# Patient Record
Sex: Male | Born: 1940
Health system: Southern US, Community
[De-identification: ages and names within clinical notes are randomized; demographics above are authoritative.]

## PROBLEM LIST (undated history)

## (undated) DIAGNOSIS — Z9889 Other specified postprocedural states: Secondary | ICD-10-CM

## (undated) DIAGNOSIS — I2510 Atherosclerotic heart disease of native coronary artery without angina pectoris: Secondary | ICD-10-CM

## (undated) DIAGNOSIS — Z95810 Presence of automatic (implantable) cardiac defibrillator: Secondary | ICD-10-CM

## (undated) DIAGNOSIS — I472 Ventricular tachycardia, unspecified: Secondary | ICD-10-CM

## (undated) DIAGNOSIS — N62 Hypertrophy of breast: Secondary | ICD-10-CM

## (undated) DIAGNOSIS — I739 Peripheral vascular disease, unspecified: Secondary | ICD-10-CM

## (undated) DIAGNOSIS — I519 Heart disease, unspecified: Secondary | ICD-10-CM

## (undated) DIAGNOSIS — I10 Essential (primary) hypertension: Secondary | ICD-10-CM

## (undated) DIAGNOSIS — E785 Hyperlipidemia, unspecified: Secondary | ICD-10-CM

## (undated) DIAGNOSIS — I5022 Chronic systolic (congestive) heart failure: Secondary | ICD-10-CM

## (undated) DIAGNOSIS — R112 Nausea with vomiting, unspecified: Secondary | ICD-10-CM

## (undated) DIAGNOSIS — T50905A Adverse effect of unspecified drugs, medicaments and biological substances, initial encounter: Secondary | ICD-10-CM

## (undated) DIAGNOSIS — I255 Ischemic cardiomyopathy: Secondary | ICD-10-CM

## (undated) DIAGNOSIS — I779 Disorder of arteries and arterioles, unspecified: Secondary | ICD-10-CM

## (undated) DIAGNOSIS — I351 Nonrheumatic aortic (valve) insufficiency: Secondary | ICD-10-CM

## (undated) DIAGNOSIS — I2699 Other pulmonary embolism without acute cor pulmonale: Secondary | ICD-10-CM

## (undated) HISTORY — DX: Adverse effect of unspecified drugs, medicaments and biological substances, initial encounter: T50.905A

## (undated) HISTORY — DX: Nonrheumatic aortic (valve) insufficiency: I35.1

## (undated) HISTORY — DX: Peripheral vascular disease, unspecified: I73.9

## (undated) HISTORY — DX: Hypertrophy of breast: N62

## (undated) HISTORY — PX: CARDIAC CATHETERIZATION: SHX172

## (undated) HISTORY — PX: RETROPUBIC PROSTATECTOMY: SUR1055

## (undated) HISTORY — PX: OTHER SURGICAL HISTORY: SHX169

## (undated) HISTORY — DX: Hyperlipidemia, unspecified: E78.5

## (undated) HISTORY — PX: THYROIDECTOMY: SHX17

## (undated) HISTORY — DX: Presence of automatic (implantable) cardiac defibrillator: Z95.810

## (undated) HISTORY — PX: CARDIAC DEFIBRILLATOR PLACEMENT: SHX171

## (undated) HISTORY — PX: INGUINAL HERNIA REPAIR: SUR1180

## (undated) HISTORY — PX: THYROID SURGERY: SHX805

## (undated) HISTORY — DX: Ventricular tachycardia: I47.2

## (undated) HISTORY — DX: Other pulmonary embolism without acute cor pulmonale: I26.99

## (undated) HISTORY — DX: Ischemic cardiomyopathy: I25.5

## (undated) HISTORY — DX: Disorder of arteries and arterioles, unspecified: I77.9

## (undated) HISTORY — PX: CORONARY STENT PLACEMENT: SHX1402

## (undated) HISTORY — DX: Ventricular tachycardia, unspecified: I47.20

## (undated) HISTORY — DX: Heart disease, unspecified: I51.9

## (undated) HISTORY — PX: THORACOTOMY: SUR1349

---

## 2001-12-10 ENCOUNTER — Inpatient Hospital Stay (HOSPITAL_COMMUNITY): Admission: EM | Admit: 2001-12-10 | Discharge: 2001-12-13 | Payer: Self-pay | Admitting: Cardiology

## 2003-02-19 ENCOUNTER — Inpatient Hospital Stay (HOSPITAL_COMMUNITY): Admission: RE | Admit: 2003-02-19 | Discharge: 2003-02-22 | Payer: Self-pay | Admitting: Urology

## 2003-02-19 ENCOUNTER — Encounter (INDEPENDENT_AMBULATORY_CARE_PROVIDER_SITE_OTHER): Payer: Self-pay | Admitting: *Deleted

## 2003-09-01 ENCOUNTER — Encounter (INDEPENDENT_AMBULATORY_CARE_PROVIDER_SITE_OTHER): Payer: Self-pay

## 2003-09-01 ENCOUNTER — Ambulatory Visit (HOSPITAL_COMMUNITY): Admission: RE | Admit: 2003-09-01 | Discharge: 2003-09-01 | Payer: Self-pay | Admitting: Surgery

## 2003-12-09 ENCOUNTER — Ambulatory Visit (HOSPITAL_COMMUNITY): Admission: RE | Admit: 2003-12-09 | Discharge: 2003-12-09 | Payer: Self-pay | Admitting: Endocrinology

## 2003-12-23 ENCOUNTER — Ambulatory Visit (HOSPITAL_COMMUNITY): Admission: RE | Admit: 2003-12-23 | Discharge: 2003-12-23 | Payer: Self-pay | Admitting: General Surgery

## 2003-12-23 ENCOUNTER — Encounter (INDEPENDENT_AMBULATORY_CARE_PROVIDER_SITE_OTHER): Payer: Self-pay | Admitting: *Deleted

## 2004-02-13 ENCOUNTER — Encounter (INDEPENDENT_AMBULATORY_CARE_PROVIDER_SITE_OTHER): Payer: Self-pay | Admitting: *Deleted

## 2004-02-13 ENCOUNTER — Observation Stay (HOSPITAL_COMMUNITY): Admission: RE | Admit: 2004-02-13 | Discharge: 2004-02-14 | Payer: Self-pay | Admitting: Surgery

## 2004-03-26 ENCOUNTER — Encounter (INDEPENDENT_AMBULATORY_CARE_PROVIDER_SITE_OTHER): Payer: Self-pay | Admitting: *Deleted

## 2004-03-26 ENCOUNTER — Ambulatory Visit (HOSPITAL_COMMUNITY): Admission: RE | Admit: 2004-03-26 | Discharge: 2004-03-27 | Payer: Self-pay | Admitting: Surgery

## 2004-03-28 ENCOUNTER — Inpatient Hospital Stay (HOSPITAL_COMMUNITY): Admission: EM | Admit: 2004-03-28 | Discharge: 2004-03-30 | Payer: Self-pay | Admitting: Emergency Medicine

## 2004-03-31 ENCOUNTER — Emergency Department (HOSPITAL_COMMUNITY): Admission: EM | Admit: 2004-03-31 | Discharge: 2004-03-31 | Payer: Self-pay | Admitting: Emergency Medicine

## 2004-03-31 ENCOUNTER — Observation Stay (HOSPITAL_COMMUNITY): Admission: EM | Admit: 2004-03-31 | Discharge: 2004-04-01 | Payer: Self-pay | Admitting: Urology

## 2004-03-31 ENCOUNTER — Ambulatory Visit (HOSPITAL_BASED_OUTPATIENT_CLINIC_OR_DEPARTMENT_OTHER): Admission: RE | Admit: 2004-03-31 | Discharge: 2004-03-31 | Payer: Self-pay | Admitting: Urology

## 2004-04-26 ENCOUNTER — Ambulatory Visit (HOSPITAL_COMMUNITY): Admission: RE | Admit: 2004-04-26 | Discharge: 2004-04-26 | Payer: Self-pay | Admitting: Endocrinology

## 2004-05-05 ENCOUNTER — Encounter (HOSPITAL_COMMUNITY): Admission: RE | Admit: 2004-05-05 | Discharge: 2004-08-03 | Payer: Self-pay | Admitting: Endocrinology

## 2004-05-21 ENCOUNTER — Encounter: Payer: Self-pay | Admitting: Cardiology

## 2004-05-21 ENCOUNTER — Inpatient Hospital Stay (HOSPITAL_COMMUNITY): Admission: AD | Admit: 2004-05-21 | Discharge: 2004-05-27 | Payer: Self-pay | Admitting: Cardiology

## 2004-06-18 ENCOUNTER — Ambulatory Visit (HOSPITAL_COMMUNITY): Admission: RE | Admit: 2004-06-18 | Discharge: 2004-06-18 | Payer: Self-pay | Admitting: Urology

## 2004-08-26 ENCOUNTER — Ambulatory Visit: Payer: Self-pay | Admitting: Cardiology

## 2004-09-27 ENCOUNTER — Ambulatory Visit: Payer: Self-pay | Admitting: Cardiology

## 2005-01-11 ENCOUNTER — Ambulatory Visit: Payer: Self-pay | Admitting: Internal Medicine

## 2005-04-25 ENCOUNTER — Encounter (HOSPITAL_COMMUNITY): Admission: RE | Admit: 2005-04-25 | Discharge: 2005-04-29 | Payer: Self-pay | Admitting: Endocrinology

## 2005-04-28 ENCOUNTER — Ambulatory Visit: Payer: Self-pay

## 2005-05-16 ENCOUNTER — Ambulatory Visit: Payer: Self-pay | Admitting: Cardiology

## 2005-05-30 ENCOUNTER — Ambulatory Visit: Payer: Self-pay | Admitting: Cardiology

## 2005-08-10 ENCOUNTER — Ambulatory Visit: Payer: Self-pay | Admitting: *Deleted

## 2005-10-18 ENCOUNTER — Ambulatory Visit: Payer: Self-pay | Admitting: Cardiology

## 2005-12-26 ENCOUNTER — Ambulatory Visit: Payer: Self-pay | Admitting: Cardiology

## 2005-12-27 ENCOUNTER — Encounter: Payer: Self-pay | Admitting: Cardiology

## 2006-01-12 ENCOUNTER — Ambulatory Visit: Payer: Self-pay | Admitting: Cardiology

## 2006-01-19 ENCOUNTER — Ambulatory Visit: Payer: Self-pay | Admitting: Cardiology

## 2006-01-24 ENCOUNTER — Ambulatory Visit: Payer: Self-pay | Admitting: Cardiology

## 2006-02-28 ENCOUNTER — Ambulatory Visit: Payer: Self-pay | Admitting: Cardiology

## 2006-04-24 ENCOUNTER — Encounter (HOSPITAL_COMMUNITY): Admission: RE | Admit: 2006-04-24 | Discharge: 2006-06-28 | Payer: Self-pay | Admitting: Endocrinology

## 2006-06-29 ENCOUNTER — Ambulatory Visit: Payer: Self-pay | Admitting: Cardiology

## 2006-07-12 ENCOUNTER — Ambulatory Visit: Payer: Self-pay | Admitting: Internal Medicine

## 2006-07-24 ENCOUNTER — Ambulatory Visit: Payer: Self-pay | Admitting: Cardiology

## 2006-07-31 ENCOUNTER — Inpatient Hospital Stay (HOSPITAL_COMMUNITY): Admission: RE | Admit: 2006-07-31 | Discharge: 2006-08-03 | Payer: Self-pay | Admitting: Internal Medicine

## 2006-07-31 ENCOUNTER — Ambulatory Visit: Payer: Self-pay | Admitting: Pulmonary Disease

## 2006-07-31 ENCOUNTER — Ambulatory Visit: Payer: Self-pay | Admitting: Internal Medicine

## 2006-08-09 ENCOUNTER — Ambulatory Visit: Payer: Self-pay

## 2006-11-23 ENCOUNTER — Ambulatory Visit: Payer: Self-pay | Admitting: Cardiology

## 2006-12-01 ENCOUNTER — Encounter: Payer: Self-pay | Admitting: Cardiology

## 2007-01-16 ENCOUNTER — Ambulatory Visit: Payer: Self-pay | Admitting: Internal Medicine

## 2007-04-18 ENCOUNTER — Ambulatory Visit: Payer: Self-pay | Admitting: Internal Medicine

## 2007-05-16 ENCOUNTER — Ambulatory Visit: Payer: Self-pay | Admitting: Internal Medicine

## 2007-07-02 ENCOUNTER — Ambulatory Visit (HOSPITAL_COMMUNITY): Admission: RE | Admit: 2007-07-02 | Discharge: 2007-07-02 | Payer: Self-pay | Admitting: Surgery

## 2007-07-02 ENCOUNTER — Encounter (INDEPENDENT_AMBULATORY_CARE_PROVIDER_SITE_OTHER): Payer: Self-pay | Admitting: Surgery

## 2007-07-26 ENCOUNTER — Ambulatory Visit: Payer: Self-pay | Admitting: Internal Medicine

## 2007-08-07 ENCOUNTER — Ambulatory Visit: Payer: Self-pay | Admitting: Cardiology

## 2007-08-28 ENCOUNTER — Ambulatory Visit: Payer: Self-pay | Admitting: Internal Medicine

## 2007-10-16 ENCOUNTER — Encounter: Payer: Self-pay | Admitting: Cardiology

## 2007-11-27 ENCOUNTER — Ambulatory Visit: Payer: Self-pay | Admitting: Internal Medicine

## 2008-01-21 ENCOUNTER — Encounter: Payer: Self-pay | Admitting: Physician Assistant

## 2008-02-26 ENCOUNTER — Ambulatory Visit: Payer: Self-pay | Admitting: Internal Medicine

## 2008-04-11 ENCOUNTER — Ambulatory Visit: Payer: Self-pay | Admitting: Cardiology

## 2008-04-22 ENCOUNTER — Encounter: Payer: Self-pay | Admitting: Physician Assistant

## 2008-04-22 ENCOUNTER — Ambulatory Visit: Payer: Self-pay | Admitting: Cardiology

## 2008-04-28 ENCOUNTER — Ambulatory Visit: Payer: Self-pay | Admitting: Internal Medicine

## 2008-05-22 ENCOUNTER — Encounter: Admission: RE | Admit: 2008-05-22 | Discharge: 2008-05-22 | Payer: Self-pay | Admitting: Endocrinology

## 2008-07-29 ENCOUNTER — Ambulatory Visit: Payer: Self-pay | Admitting: Internal Medicine

## 2008-09-04 ENCOUNTER — Encounter: Payer: Self-pay | Admitting: Physician Assistant

## 2008-10-28 ENCOUNTER — Ambulatory Visit: Payer: Self-pay | Admitting: Internal Medicine

## 2008-10-29 ENCOUNTER — Ambulatory Visit: Payer: Self-pay | Admitting: Internal Medicine

## 2008-12-26 ENCOUNTER — Ambulatory Visit: Payer: Self-pay | Admitting: Cardiology

## 2009-01-13 ENCOUNTER — Encounter: Payer: Self-pay | Admitting: Internal Medicine

## 2009-01-27 ENCOUNTER — Ambulatory Visit: Payer: Self-pay | Admitting: Internal Medicine

## 2009-02-05 ENCOUNTER — Encounter: Payer: Self-pay | Admitting: Cardiology

## 2009-04-10 DIAGNOSIS — Z87442 Personal history of urinary calculi: Secondary | ICD-10-CM

## 2009-04-10 DIAGNOSIS — I509 Heart failure, unspecified: Secondary | ICD-10-CM | POA: Insufficient documentation

## 2009-04-10 DIAGNOSIS — I472 Ventricular tachycardia: Secondary | ICD-10-CM | POA: Insufficient documentation

## 2009-04-10 DIAGNOSIS — I2699 Other pulmonary embolism without acute cor pulmonale: Secondary | ICD-10-CM | POA: Insufficient documentation

## 2009-04-10 DIAGNOSIS — J432 Centrilobular emphysema: Secondary | ICD-10-CM

## 2009-04-10 DIAGNOSIS — N200 Calculus of kidney: Secondary | ICD-10-CM

## 2009-04-10 DIAGNOSIS — I2589 Other forms of chronic ischemic heart disease: Secondary | ICD-10-CM | POA: Insufficient documentation

## 2009-06-24 ENCOUNTER — Ambulatory Visit: Payer: Self-pay | Admitting: Internal Medicine

## 2009-07-20 ENCOUNTER — Ambulatory Visit: Payer: Self-pay | Admitting: Cardiology

## 2009-07-20 DIAGNOSIS — I6529 Occlusion and stenosis of unspecified carotid artery: Secondary | ICD-10-CM

## 2009-09-22 ENCOUNTER — Encounter: Payer: Self-pay | Admitting: Internal Medicine

## 2009-10-12 ENCOUNTER — Encounter: Payer: Self-pay | Admitting: Internal Medicine

## 2009-10-20 ENCOUNTER — Telehealth (INDEPENDENT_AMBULATORY_CARE_PROVIDER_SITE_OTHER): Payer: Self-pay | Admitting: *Deleted

## 2009-12-21 ENCOUNTER — Encounter: Payer: Self-pay | Admitting: Cardiology

## 2009-12-22 ENCOUNTER — Ambulatory Visit: Payer: Self-pay | Admitting: Internal Medicine

## 2009-12-29 ENCOUNTER — Encounter: Payer: Self-pay | Admitting: Internal Medicine

## 2010-03-23 ENCOUNTER — Encounter: Payer: Self-pay | Admitting: Internal Medicine

## 2010-03-30 ENCOUNTER — Ambulatory Visit: Payer: Self-pay | Admitting: Internal Medicine

## 2010-04-06 ENCOUNTER — Encounter: Payer: Self-pay | Admitting: Internal Medicine

## 2010-06-22 ENCOUNTER — Ambulatory Visit: Payer: Self-pay | Admitting: Internal Medicine

## 2010-06-22 DIAGNOSIS — Z95810 Presence of automatic (implantable) cardiac defibrillator: Secondary | ICD-10-CM | POA: Insufficient documentation

## 2010-08-23 ENCOUNTER — Encounter: Payer: Self-pay | Admitting: Physician Assistant

## 2010-08-23 ENCOUNTER — Ambulatory Visit: Payer: Self-pay | Admitting: Cardiology

## 2010-08-23 DIAGNOSIS — I2510 Atherosclerotic heart disease of native coronary artery without angina pectoris: Secondary | ICD-10-CM

## 2010-08-23 DIAGNOSIS — I5022 Chronic systolic (congestive) heart failure: Secondary | ICD-10-CM

## 2010-08-23 DIAGNOSIS — E785 Hyperlipidemia, unspecified: Secondary | ICD-10-CM

## 2010-08-23 DIAGNOSIS — I38 Endocarditis, valve unspecified: Secondary | ICD-10-CM | POA: Insufficient documentation

## 2010-08-23 DIAGNOSIS — R0989 Other specified symptoms and signs involving the circulatory and respiratory systems: Secondary | ICD-10-CM | POA: Insufficient documentation

## 2010-09-02 ENCOUNTER — Encounter: Payer: Self-pay | Admitting: Cardiology

## 2010-09-07 ENCOUNTER — Encounter (INDEPENDENT_AMBULATORY_CARE_PROVIDER_SITE_OTHER): Payer: Self-pay | Admitting: *Deleted

## 2010-09-23 ENCOUNTER — Ambulatory Visit: Payer: Self-pay | Admitting: Internal Medicine

## 2010-10-26 ENCOUNTER — Encounter (INDEPENDENT_AMBULATORY_CARE_PROVIDER_SITE_OTHER): Payer: Self-pay | Admitting: *Deleted

## 2010-10-27 ENCOUNTER — Encounter (INDEPENDENT_AMBULATORY_CARE_PROVIDER_SITE_OTHER): Payer: Self-pay | Admitting: *Deleted

## 2010-10-28 ENCOUNTER — Encounter: Payer: Self-pay | Admitting: Internal Medicine

## 2010-11-04 ENCOUNTER — Ambulatory Visit
Admission: RE | Admit: 2010-11-04 | Discharge: 2010-11-04 | Payer: Self-pay | Source: Home / Self Care | Attending: Internal Medicine | Admitting: Internal Medicine

## 2010-11-07 ENCOUNTER — Encounter: Payer: Self-pay | Admitting: Critical Care Medicine

## 2010-11-07 ENCOUNTER — Encounter: Payer: Self-pay | Admitting: Endocrinology

## 2010-11-16 NOTE — Cardiovascular Report (Signed)
Summary: Office Visit Remote   Office Visit Remote   Imported By: Roderic Ovens 04/10/2010 11:53:17  _____________________________________________________________________  External Attachment:    Type:   Image     Comment:   External Document

## 2010-11-16 NOTE — Letter (Signed)
Summary: Remote Device Check  [facility], Main Office  [address]   [place], [place] [address]   Phone: [phone]  Fax: [phone]     April 06, 2010 MRN: [id number]   [name] [address] [place], [place]  [address]   Dear [name],   Your remote transmission was recieved and reviewed by your physician.  All diagnostics were within normal limits for you.   ___X___Your next office visit is scheduled for:  SEPTEMBER 2011 W/DR Ladona Ridgel.  Please call our office to schedule an appointment.    Sincerely,  Vella Kohler

## 2010-11-16 NOTE — Cardiovascular Report (Signed)
Summary: Office Visit Remote   Office Visit Remote   Imported By: Roderic Ovens 12/30/2009 11:06:53  _____________________________________________________________________  External Attachment:    Type:   Image     Comment:   External Document

## 2010-11-16 NOTE — Progress Notes (Signed)
Summary: Med Questions  Phone Note Call from Patient Call back at Home Phone [phone]   Summary of Call: Pt left message on Lydia's voicemail asking for return call regarding some prescription questions. Attempted to reach pt but no answer. Left message to call back on machine. Initial call taken by: Cyril Loosen, RN, BSN,  October 20, 2009 3:43 PM  Follow-up for Phone Call        Needs all cardiac meds refilled to [facility]. Follow-up by: Cyril Loosen, RN, BSN,  October 21, 2009 3:40 PM    Prescriptions: TRANDOLAPRIL 2 MG TABS (TRANDOLAPRIL) Take 1 1/2 tablet by mouth once a day  #135 x 3   Entered by:   Cyril Loosen, RN, BSN   Authorized by:   Lewayne Bunting, MD, [facility]   Signed by:   Cyril Loosen, RN, BSN on 10/21/2009   Method used:   Electronically to        [facility]* (retail)       [address] [place], [place]  [address]       Ph: [phone]       Fax: [phone]   RxID:   [id number] SIMVASTATIN 20 MG TABS (SIMVASTATIN) Take 1 tablet by mouth at bedtime  #90 x 3   Entered by:   Cyril Loosen, RN, BSN   Authorized by:   Lewayne Bunting, MD, [facility]   Signed by:   Cyril Loosen, RN, BSN on 10/21/2009   Method used:   Electronically to        [facility]* (retail)       [address] [place], [place]  [address]       Ph: [phone]       Fax: [phone]   RxID:   [id number] LASIX 20 MG TABS (FUROSEMIDE) Take 1 tablet by mouth every morning  #90 x 3   Entered by:   Cyril Loosen, RN, BSN   Authorized by:   Lewayne Bunting, MD, [facility]   Signed by:   Cyril Loosen, RN, BSN on 10/21/2009   Method used:   Electronically to        [facility]* (retail)       [address] [place], [place]  [address]       Ph: [phone]       Fax: [phone]   RxID:   [id number] EPLERENONE 25 MG TABS (EPLERENONE) Take 1 tablet by mouth once a day  #90 x 3   Entered by:   Cyril Loosen, RN, BSN   Authorized by:   Lewayne Bunting, MD, [facility]   Signed by:    Cyril Loosen, RN, BSN on 10/21/2009   Method used:   Electronically to        [facility] (retail)       [address] [place], [place]  [address]       Ph: [phone]       Fax: [phone]   RxID:   [id number] DIGOXIN 0.25 MG TABS (DIGOXIN) Take 1 tablet by mouth once a day  #90 x 3   Entered by:   Cyril Loosen, RN, BSN   Authorized by:   Lewayne Bunting, MD, [facility]   Signed by:   Cyril Loosen, RN, BSN on 10/21/2009   Method used:   Electronically to  [facility]* (retail)       [address] [place], [place]  [address]       Ph: [phone]       Fax: [phone]   RxID:   [id number] COREG 25 MG TABS (CARVEDILOL) Take 1 tablet by mouth twice a day  #180 x 3   Entered by:   Cyril Loosen, RN, BSN   Authorized by:   Lewayne Bunting, MD, [facility]   Signed by:   Cyril Loosen, RN, BSN on 10/21/2009   Method used:   Electronically to        [facility]* (retail)       [address] [place], [place]  [address]       Ph: [phone]       Fax: [phone]   RxID:   [id number]

## 2010-11-16 NOTE — Assessment & Plan Note (Signed)
Summary: 1 YR FU PER OCT REMINDER   Visit Type:  Follow-up Primary Provider:  Dhruv Vyas,MD   History of Present Illness: patient presents for annual followup.  He denies any interim development of signs or symptoms suggestive of unstable angina pectoris, or decompensated heart failure. He denies any defibrillator shocks, or tachycardia palpitations. He had a recent scheduled device clinic check with Dr. Ladona Ridgel, with no noted adjustments and no evidence of recurrent VT.  Patient is awaiting scheduling for multiple teeth extraction, later this week. He was instructed to hold his aspirin last week. This apparently will be done under local anesthetic.  Preventive Screening-Counseling & Management  Alcohol-Tobacco     Smoking Status: quit     Year Quit: 2005  Current Medications (verified): 1)  Coreg 25 Mg Tabs (Carvedilol) .... Take 1 Tablet By Mouth Twice A Day 2)  Digoxin 0.25 Mg Tabs (Digoxin) .... Take 1 Tablet By Mouth Once A Day 3)  Eplerenone 25 Mg Tabs (Eplerenone) .... Take 1 Tablet By Mouth Once A Day 4)  Lasix 20 Mg Tabs (Furosemide) .... Take 1 Tablet By Mouth Every Morning 5)  Simvastatin 20 Mg Tabs (Simvastatin) .... Take 1 Tablet By Mouth At Bedtime 6)  Trandolapril 2 Mg Tabs (Trandolapril) .... Take 1 1/2 Tablet By Mouth Once A Day 7)  Levothyroxine Sodium 150 Mcg Tabs (Levothyroxine Sodium) .Marland Kitchen.. 1 By Mouth Once Daily 8)  Aspirin Ec 325 Mg Tbec (Aspirin) .... Take 1/2 Tab (162mg ) Dailyl 9)  Niacin 500 Mg Tabs (Niacin) .Marland Kitchen.. 1 By Mouth Once Daily 10)  Fish Oil 1000 Mg Caps (Omega-3 Fatty Acids) .Marland Kitchen.. 1 By Mouth Once Daily  Allergies (verified): No Known Drug Allergies  Comments:  Nurse/Medical Assistant: The patient's medication list and allergies were reviewed with the patient and were updated in the Medication and Allergy Lists.  Past History:  Past Medical History: NEPHROLITHIASIS (ICD-592.0) PULMONARY EMBOLISM (ICD-415.19) COPD (ICD-496) VENTRICULAR  TACHYCARDIA (ICD-427.1) CHF (ICD-428.0) RENAL CALCULUS, HX OF (ICD-V13.01) CARDIOMYOPATHY, ISCHEMIC (ICD-414.8) 1. Ischemic cardiomyopathy.  Ejection fraction 20-25%...improved to 50-55% by 2-D echo, 7/09 2. Painful gynecomastia secondary to spironolactone. 3. Coronary artery disease, status post non-ST-elevation myocardial     infarction with stent to the right coronary artery. 4. History of inducible polymorphic ventricular tachycardia, status     post Medtronic implantable cardioverter-defibrillator in August     2005. 5. Status post upgrade with cardiac resynchronization therapy in     October 2007. 6. Carotid artery stenosis.  There was a 50% stenosis bilaterally. 7. Prior history of pulmonary embolism. 8. Moderate aortic insufficiency. 9. Dyslipidemia. 10.Pulmonary hypertension.   Review of Systems       No fevers, chills, hemoptysis, dysphagia, melena, hematocheezia, hematuria, rash, claudication, orthopnea, pnd, pedal edema. All other systems negative.   Vital Signs:  Patient profile:   70 year old male Height:      69 inches Weight:      141 pounds Pulse rate:   53 / minute BP sitting:   137 / 75  (left arm) Cuff size:   regular  Vitals Entered By: Carlye Grippe (August 23, 2010 3:40 PM)  Physical Exam  Additional Exam:  GEN: 70 year old male comes sitting upright, no distress HEENT: NCAT,PERRLA,EOMI NECK:  left sided bruit; no JVD; no TM LUNGS: CTA bilaterally HEART: RRR (S1S2); grade 2-3/6 diastolic blow along the LSB ABD: soft, NT; intact BS EXT: intact distal pulses; no edema SKIN: warm, dry MUSC: no obvious deformity NEURO: A/O (x3)  EKG  Procedure date:  08/23/2010  Findings:      AV pacing at 54 bpm   ICD Specifications Following MD:  Lewayne Bunting, MD     Referring MD:  [facility] ICD Vendor:  Medtronic     ICD Model Number:  7304     ICD Serial Number:  [id number] ICD DOI:  07/31/2006     ICD Implanting MD:  Lewayne Bunting, MD  Lead 1:     Location: RA     DOI: 05/26/2004     Model #: 4098     Serial #: [id number]     Status: active Lead 2:    Location: RV     DOI: 05/26/2004     Model #: 5621     Serial #: [id number]     Status: active Lead 3:    Location: LV     DOI: 07/31/2006     Model #: [id number]     Serial #: [id number]     Status: active  Indications::  ICM   ICD Follow Up ICD Dependent:  No      Episodes Coumadin:  No  Brady Parameters Mode DDDR     Lower Rate Limit:  50     Upper Rate Limit 130 PAV 150     Sensed AV Delay:  130  Tachy Zones VF:  200     VT:  250     VT1:  162     Impression & Recommendations:  Problem # 1:  CHRONIC SYSTOLIC HEART FAILURE (ICD-428.22)  patient appears euvolemic both by history and physical presentation. continue current medication regimen. Check baseline labs. of note, prior history of EF 20-25%, improved to 50-55%, by 2-D echo, 7/09.  Problem # 2:  CORONARY ATHEROSCLEROSIS NATIVE CORONARY ARTERY (ICD-414.01)  no current indication for further ischemic workup. previous nuclear imaging study, in 2007, indicated large inferior scar with no definite ischemia; EF 26%, with global HK. Patient cleared to proceed with dental extraction, later this week, and will not require deactivation of his ICD. following the procedure, patient is to resume aspirin, albeit at the reduced dose of 162 mg daily.  Problem # 3:  AUTOMATIC IMPLANTABLE CARDIAC DEFIBRILLATOR SITU (ICD-V45.02)  followup with Dr. Sharrell Ku, as scheduled  Problem # 4:  DYSLIPIDEMIA (ICD-272.4)  reassess current lipid status with FLP/LFT profile  Problem # 5:  CAROTID BRUIT (ICD-785.9)  scheduled Dopplers to rule out significant left-sided disease. Previous study, in 2008, indicated nonobstructive disease, bilaterally  Problem # 6:  VALVULAR HEART DISEASE (ICD-424.90)  assessed as moderate, by last echo, 7/09  Other Orders: EKG w/ Interpretation (93000) T-Comprehensive Metabolic Panel ([address]27-25366) T-CBC No Diff  ([address]34-74259) T-Lipid Profile (56387-56433) Carotid Duplex (Carotid Duplex)  Patient Instructions: 1)  Decrease Aspirin to 162mg  daily 2)  Labs:  CMET, CBC, FLP 3)  Carotid doppler 4)  Follow up in  6 months

## 2010-11-16 NOTE — Cardiovascular Report (Signed)
Summary: Office Visit   Office Visit   Imported By: Roderic Ovens 06/29/2010 12:03:18  _____________________________________________________________________  External Attachment:    Type:   Image     Comment:   External Document

## 2010-11-16 NOTE — Assessment & Plan Note (Signed)
Summary: [employer]/SAF   Visit Type:  Follow-up Primary Provider:  Dr.Best in [facility]/Vyas   History of Present Illness: [name] returns today for followup.  He is a pleasant 70 yo man with a h/o ICM, CHF, VT, and is s/p BiV ICD implant for all of the above in the setting of  LBBB.  He denies c/p, sob, or peripheral edema. 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  Current Medications (verified): 1)  Coreg 25 Mg Tabs (Carvedilol) .... Take 1 Tablet By Mouth Twice A Day 2)  Digoxin 0.25 Mg Tabs (Digoxin) .... Take 1 Tablet By Mouth Once A Day 3)  Eplerenone 25 Mg Tabs (Eplerenone) .... Take 1 Tablet By Mouth Once A Day 4)  Lasix 20 Mg Tabs (Furosemide) .... Take 1 Tablet By Mouth Every Morning 5)  Simvastatin 20 Mg Tabs (Simvastatin) .... Take 1 Tablet By Mouth At Bedtime 6)  Trandolapril 2 Mg Tabs (Trandolapril) .... Take 1 1/2 Tablet By Mouth Once A Day 7)  Levothyroxine Sodium 150 Mcg Tabs (Levothyroxine Sodium) .Marland Kitchen.. 1 By Mouth Once Daily 8)  Aspirin Ec 325 Mg Tbec (Aspirin) .... Take One Tablet By Mouth Daily 9)  Niacin 500 Mg Tabs (Niacin) .Marland Kitchen.. 1 By Mouth Once Daily 10)  Fish Oil 1000 Mg Caps (Omega-3 Fatty Acids) .Marland Kitchen.. 1 By Mouth Once Daily  Allergies (verified): No Known Drug Allergies  Past History:  Past Medical History: Last updated: 07/20/2009 NEPHROLITHIASIS (ICD-592.0) PULMONARY EMBOLISM (ICD-415.19) COPD (ICD-496) VENTRICULAR TACHYCARDIA (ICD-427.1) CHF (ICD-428.0) RENAL CALCULUS, HX OF (ICD-V13.01) CARDIOMYOPATHY, ISCHEMIC (ICD-414.8) 1. Ischemic cardiomyopathy.  Ejection fraction 20-25%. 2. Painful gynecomastia secondary to spironolactone. 3. Coronary artery disease, status post non-ST-elevation myocardial     infarction with stent to the right coronary artery. 4. History of inducible polymorphic ventricular tachycardia, status     post Medtronic implantable cardioverter-defibrillator in August     2005. 5. Status post upgrade  with cardiac resynchronization therapy in     October 2007. 6. Carotid artery stenosis.  There was a 50% stenosis bilaterally. 7. Prior history of pulmonary embolism. 8. Moderate aortic insufficiency. 9. Dyslipidemia. 10.Pulmonary hypertension.   Past Surgical History: Last updated: 04/10/2009 Left thoracotomy  thyroid cancer with surgery x2.  stent to his right coronary artery.  Radical retropubic prostatectomy.  Repair of indirect right inguinal hernia. The Medronic Intrust-D 154 ATZ dual chamber defibrillator, serial   Left breast biopsy.      Review of Systems  The patient denies chest pain, syncope, dyspnea on exertion, and peripheral edema.    Vital Signs:  Patient profile:   70 year old male Height:      69 inches Weight:      144 pounds BMI:     21.34 Pulse rate:   64 / minute BP sitting:   140 / 60  (left arm)  Vitals Entered By: Laurance Flatten CMA (June 22, 2010 4:13 PM)  Physical Exam  General:  Well developed, well nourished, in no acute distress. Head:  normocephalic and atraumatic Eyes:  PERRLA/EOM intact; conjunctiva and lids normal. Mouth:  Teeth, gums and palate normal. Oral mucosa normal. Neck:  Neck supple, no JVD. No masses, thyromegaly or abnormal cervical nodes. Chest Wall:  defibrillator is well placed left subclavian area. Lungs:  Clear bilaterally to auscultation with no wheezes, rales, or rhonchi.. Heart:  RRR with normal S1 and S2.  PMI is enlarged and laterally displaced. A soft AI murmur is present. Abdomen:  Bowel sounds  positive; abdomen soft and non-tender without masses, organomegaly, or hernias noted. No hepatosplenomegaly. Msk:  Back normal, normal gait. Muscle strength and tone normal. Pulses:  pulses normal in all 4 extremities Extremities:  No clubbing or cyanosis. Neurologic:  Alert and oriented x 3.    ICD Specifications Following MD:  Lewayne Bunting, MD     Referring MD:  [facility] ICD Vendor:  Medtronic     ICD Model Number:   7304     ICD Serial Number:  [id number] ICD DOI:  07/31/2006     ICD Implanting MD:  Lewayne Bunting, MD  Lead 1:    Location: RA     DOI: 05/26/2004     Model #: 4098     Serial #: [id number]     Status: active Lead 2:    Location: RV     DOI: 05/26/2004     Model #: 5621     Serial #: [id number]     Status: active Lead 3:    Location: LV     DOI: 07/31/2006     Model #: 9629     Serial #: [id number]     Status: active  Indications::  ICM   ICD Follow Up Battery Voltage:  2.72 V     Charge Time:  9.81 seconds     Underlying rhythm:  SR ICD Dependent:  No       ICD Device Measurements Atrium:  Amplitude: 5.0 mV, Impedance: 576 ohms, Threshold: 1.0 V at 0.2 msec Right Ventricle:  Amplitude: 12.1 mV, Impedance: 576 ohms, Threshold: 1.5 V at 0.7 msec Left Ventricle:  Impedance: 408 ohms, Threshold: 1.0 V at 0.3 msec Shock Impedance: 50/68 ohms   Episodes MS Episodes:  0     Coumadin:  No Shock:  0     ATP:  0     Nonsustained:  2     Atrial Therapies:  0 Atrial Pacing:  32.8%     Ventricular Pacing:  99.9%  Brady Parameters Mode DDDR     Lower Rate Limit:  50     Upper Rate Limit 130 PAV 150     Sensed AV Delay:  130  Tachy Zones VF:  200     VT:  250     VT1:  162     Next Remote Date:  09/23/2010     Next Cardiology Appt Due:  06/20/2011 Tech Comments:  2 NST EPISODES--LONGEST WAS 8 BEATS.  NORMAL DEVICE FUNCTION.  NO CHANGES MADE.  0102 LEAD STABLE. SIC 0.  LETTER AND MAGNET GIVEN TO PT.  CARELINK CHECK 09-23-10. ROV IN 12 MTHS W/GT. Vella Kohler  June 22, 2010 4:25 PM MD Comments:  Agree with above.  Impression & Recommendations:  Problem # 1:  VENTRICULAR TACHYCARDIA (ICD-427.1) He has had no additional symptomatic VT.  Continue meds as below. His updated medication list for this problem includes:    Coreg 25 Mg Tabs (Carvedilol) .Marland Kitchen... Take 1 tablet by mouth twice a day    Trandolapril 2 Mg Tabs (Trandolapril) .Marland Kitchen... Take 1 1/2 tablet by mouth once a day    Aspirin Ec  325 Mg Tbec (Aspirin) .Marland Kitchen... Take one tablet by mouth daily  Problem # 2:  AUTOMATIC IMPLANTABLE CARDIAC DEFIBRILLATOR SITU (ICD-V45.02) His device is working normally.  Will followup in several months.  Problem # 3:  CAROTID ARTERY DISEASE (ICD-433.10) He denies anginal symptoms His updated medication list for this problem includes:    Aspirin Ec  325 Mg Tbec (Aspirin) .Marland Kitchen... Take one tablet by mouth daily  Patient Instructions: 1)  Your physician recommends that you schedule a follow-up appointment in: 12 months with Dr Ladona Ridgel

## 2010-11-16 NOTE — Letter (Signed)
Summary: Engineer, materials at [facility]  [address]   [place], [place] [address]   Phone: [phone]  Fax: [phone]        September 07, 2010 MRN: [id number]   CAYDN JUSTEN [address] [place], [place]  [address]   Dear Mr. Marchena,  Your test ordered by Selena Batten has been reviewed by your physician (or physician assistant) and was found to be normal or stable. Your physician (or physician assistant) felt no changes were needed at this time.  ____ Echocardiogram  ____ Cardiac Stress Test  __X__ Lab Work  __X__ Peripheral vascular study of arms, legs or neck  ____ CT scan or X-ray  ____ Lung or Breathing test  ____ Other:   Thank you.   Hoover Brunette, LPN    Duane Boston, M.D., F.A.C.C. Thressa Sheller, M.D., F.A.C.C. Oneal Grout, M.D., F.A.C.C. Cheree Ditto, M.D., F.A.C.C. Daiva Nakayama, M.D., F.A.C.C. Kenney Houseman, M.D., F.A.C.C. Jeanne Ivan, PA-C

## 2010-11-16 NOTE — Letter (Signed)
Summary: Remote Device Check  [facility], Main Office  [address]   [place], [place] [address]   Phone: [phone]  Fax: [phone]     December 29, 2009 MRN: [id number]   [name] [address] [place], [place]  [address]   Dear Mr. Marshman,   Your remote transmission was recieved and reviewed by your physician.  All diagnostics were within normal limits for you.  __X___Your next transmission is scheduled for:  March 23, 2010.  Please transmit at any time this day.  If you have a wireless device your transmission will be sent automatically.      Sincerely,  Proofreader

## 2010-11-18 NOTE — Letter (Signed)
Summary: Remote Device Check  [facility], Main Office  [address]   [place], [place] [address]   Phone: [phone]  Fax: [phone]     October 26, 2010 MRN: [id number]   [name] [address] [place], [place]  [address]   Dear Mr. Garcia,   Your remote transmission was recieved and reviewed by your physician.  All diagnostics were within normal limits for you.  __X__Your next transmission is scheduled for: 12-23-10.  Please transmit at any time this day.  If you have a wireless device your transmission will be sent automatically.    Sincerely,  Vella Kohler

## 2010-11-18 NOTE — Letter (Signed)
Summary: Remote Device Check  [facility], Main Office  [address]   [place], [place] [address]   Phone: [phone]  Fax: [phone]     October 27, 2010 MRN: [id number]   LINDEN TAGLIAFERRO [address] [place], [place]  [address]   Dear Mr. Kerekes,   Your remote transmission was recieved and reviewed by your physician.  All diagnostics were within normal limits for you.  __X___Your next transmission is scheduled for:  10-28-2010.  Please transmit at any time this day.  If you have a wireless device your transmission will be sent automatically.      Sincerely,  Vella Kohler

## 2010-11-18 NOTE — Cardiovascular Report (Signed)
Summary: Office Visit Remote   Office Visit Remote   Imported By: Roderic Ovens 10/29/2010 12:07:14  _____________________________________________________________________  External Attachment:    Type:   Image     Comment:   External Document

## 2010-11-28 ENCOUNTER — Encounter (INDEPENDENT_AMBULATORY_CARE_PROVIDER_SITE_OTHER): Payer: Self-pay | Admitting: *Deleted

## 2010-12-02 ENCOUNTER — Encounter: Payer: Self-pay | Admitting: Internal Medicine

## 2010-12-02 ENCOUNTER — Encounter (INDEPENDENT_AMBULATORY_CARE_PROVIDER_SITE_OTHER): Payer: Medicare Other

## 2010-12-02 DIAGNOSIS — Z95810 Presence of automatic (implantable) cardiac defibrillator: Secondary | ICD-10-CM

## 2010-12-02 DIAGNOSIS — I472 Ventricular tachycardia: Secondary | ICD-10-CM

## 2010-12-08 NOTE — Cardiovascular Report (Signed)
Summary: Office Visit Remote   Office Visit Remote   Imported By: Roderic Ovens 11/30/2010 15:10:48  _____________________________________________________________________  External Attachment:    Type:   Image     Comment:   External Document

## 2010-12-08 NOTE — Letter (Signed)
Summary: Remote Device Check  [facility], Main Office  [address]   [place], [place] [address]   Phone: [phone]  Fax: [phone]     November 28, 2010 MRN: [id number]   RASHEED WELTY [address] [place], [place]  [address]   Dear Mr. Esterline,   Your remote transmission was recieved and reviewed by your physician.  All diagnostics were within normal limits for you.  __X___Your next transmission is scheduled for: 12-02-2010.  Please transmit at any time this day.  If you have a wireless device your transmission will be sent automatically.    Sincerely,  Vella Kohler

## 2011-01-03 ENCOUNTER — Encounter: Payer: Self-pay | Admitting: *Deleted

## 2011-01-04 ENCOUNTER — Ambulatory Visit (INDEPENDENT_AMBULATORY_CARE_PROVIDER_SITE_OTHER): Payer: Medicare Other | Admitting: Internal Medicine

## 2011-01-04 DIAGNOSIS — K921 Melena: Secondary | ICD-10-CM

## 2011-01-04 DIAGNOSIS — R1310 Dysphagia, unspecified: Secondary | ICD-10-CM

## 2011-01-12 ENCOUNTER — Telehealth: Payer: Self-pay | Admitting: Physician Assistant

## 2011-01-12 NOTE — Telephone Encounter (Signed)
Pt's wife called in tonight to state her husband's ICD was beeping this evening. He felt fine without CP or SOB. He was told before that he had "something wrong with his leads" about a year ago and to call if the machine made noise, so they did. He denied any ICD discharges. I called Peggyann Shoals, [employer] rep, who advised me to tell the patient's wife to send in a transmission from the device which was done. Gypsy Balsam, [facility] reviewed the device data and concluded he was at ERI (and thus in no immediate danger). A followup appointment was made for Tuesday to discuss replacement. These results were conveyed to the patient and his wife who expressed understanding.

## 2011-01-13 NOTE — Letter (Signed)
Summary: Remote Device Check  [facility], Main Office  [address]   [place], [place] [address]   Phone: [phone]  Fax: [phone]     January 03, 2011 MRN: [id number]   [name] [address] [place], [place]  [address]   Dear [name],   Your remote transmission was recieved and reviewed by your physician.  All diagnostics were within normal limits for you.  __X___Your next transmission is scheduled for:  03-03-2011.  Please transmit at any time this day.  If you have a wireless device your transmission will be sent automatically.   Sincerely,  Vella Kohler

## 2011-01-13 NOTE — Cardiovascular Report (Signed)
Summary: Office Visit   Office Visit   Imported By: Roderic Ovens 01/04/2011 15:26:48  _____________________________________________________________________  External Attachment:    Type:   Image     Comment:   External Document

## 2011-01-17 ENCOUNTER — Encounter: Payer: Self-pay | Admitting: Internal Medicine

## 2011-01-17 DIAGNOSIS — N200 Calculus of kidney: Secondary | ICD-10-CM

## 2011-01-18 ENCOUNTER — Ambulatory Visit (INDEPENDENT_AMBULATORY_CARE_PROVIDER_SITE_OTHER): Payer: Medicare Other | Admitting: Internal Medicine

## 2011-01-18 ENCOUNTER — Encounter: Payer: Self-pay | Admitting: Internal Medicine

## 2011-01-18 VITALS — BP 132/60 | HR 64 | Ht 68.0 in | Wt 138.0 lb

## 2011-01-18 DIAGNOSIS — E785 Hyperlipidemia, unspecified: Secondary | ICD-10-CM

## 2011-01-18 DIAGNOSIS — I5022 Chronic systolic (congestive) heart failure: Secondary | ICD-10-CM

## 2011-01-18 DIAGNOSIS — I428 Other cardiomyopathies: Secondary | ICD-10-CM

## 2011-01-18 DIAGNOSIS — Z95810 Presence of automatic (implantable) cardiac defibrillator: Secondary | ICD-10-CM

## 2011-01-18 DIAGNOSIS — I509 Heart failure, unspecified: Secondary | ICD-10-CM

## 2011-01-18 NOTE — Assessment & Plan Note (Signed)
He will continue his current meds. A low fat diet is requested.

## 2011-01-18 NOTE — Assessment & Plan Note (Signed)
His device has reached ERI. I have discussed the treatment options and he wishes to proceed with ICD generator change out. The risks/benefits/goals and expectations of the procedure have been discussed and he wishes to proceed.

## 2011-01-18 NOTE — Assessment & Plan Note (Signed)
His symptoms remain class 2. He will continue his current meds and maintain a low sodium diet. 

## 2011-01-18 NOTE — Progress Notes (Signed)
HPI [name] returns today for followup. He is a pleasant 70 yo man with a h/o ICM, CHF, and dyslipidemia. He recently noted that his device was beeping and he returns for followup. He denies c/p, sob, or peripheral edema.   No Known Allergies   Current Outpatient Prescriptions  Medication Sig Dispense Refill  . aspirin 325 MG tablet Take 325 mg by mouth daily.        . carvedilol (COREG) 25 MG tablet Take 25 mg by mouth 2 (two) times daily with a meal.        . digoxin (LANOXIN) 0.25 MG tablet Take 250 mcg by mouth daily.        Marland Kitchen eplerenone (INSPRA) 25 MG tablet Take 25 mg by mouth daily.        . fish oil-omega-3 fatty acids 1000 MG capsule Take 2 g by mouth daily.        . furosemide (LASIX) 20 MG tablet Take 20 mg by mouth daily.       Marland Kitchen levothyroxine (SYNTHROID, LEVOTHROID) 150 MCG tablet Take 150 mcg by mouth daily.        . niacin 500 MG tablet Take 500 mg by mouth daily with breakfast.        . simvastatin (ZOCOR) 20 MG tablet Take 20 mg by mouth at bedtime.        . trandolapril (MAVIK) 2 MG tablet Take 3 mg by mouth daily.           Past Medical History  Diagnosis Date  . NEPHROLITHIASIS 04/10/2009  . PULMONARY EMBOLISM 04/10/2009  . COPD 04/10/2009  . VENTRICULAR TACHYCARDIA 04/10/2009  . CHF 04/10/2009  . RENAL CALCULUS, HX OF 04/10/2009  . CARDIOMYOPATHY, ISCHEMIC 04/10/2009  . CORONARY ATHEROSCLEROSIS NATIVE CORONARY ARTERY 08/23/2010  . CAROTID ARTERY DISEASE 07/20/2009  . DYSLIPIDEMIA 08/23/2010  . Pulmonary HTN     ROS:   All systems reviewed and negative except as noted in the HPI.   Past Surgical History  Procedure Date  . Thoracotomy     left  . Coronary stent placement   . Retropubic prostatectomy   . Inguinal hernia repair   . Cardiac defibrillator placement      Family History  Problem Relation Age of Onset  . Diabetes       History   Social History  . Marital Status: Married    Spouse Name: N/A    Number of Children: 2  . Years of  Education: N/A   Occupational History  . plumber - self employed    Social History Main Topics  . Smoking status: Former Games developer  . Smokeless tobacco: Not on file  . Alcohol Use: No  . Drug Use: No  . Sexually Active: Not on file   Other Topics Concern  . Not on file   Social History Narrative  . No narrative on file     BP 132/60  Pulse 64  Ht 5\' 8"  (1.727 m)  Wt 138 lb (62.596 kg)  BMI 20.98 kg/m2  Physical Exam:  Well appearing NAD HEENT: Unremarkable Neck:  No JVD, no thyromegally Lymphatics:  No adenopathy Back:  No CVA tenderness Lungs:  Clear. Well healed ICD incision. HEART:  Regular rate rhythm, no murmurs, no rubs, no clicks Abd:  Flat, positive bowel sounds, no organomegally, no rebound, no guarding Ext:  2 plus pulses, no edema, no cyanosis, no clubbing Skin:  No rashes no nodules Neuro:  CN II through XII intact,  motor grossly intact  DEVICE  Normal device function.  See PaceArt for details.   Assess/Plan:

## 2011-01-19 ENCOUNTER — Encounter: Payer: Self-pay | Admitting: *Deleted

## 2011-01-24 ENCOUNTER — Other Ambulatory Visit (INDEPENDENT_AMBULATORY_CARE_PROVIDER_SITE_OTHER): Payer: Medicare Other | Admitting: *Deleted

## 2011-01-24 DIAGNOSIS — I5022 Chronic systolic (congestive) heart failure: Secondary | ICD-10-CM

## 2011-01-24 DIAGNOSIS — T81718A Complication of other artery following a procedure, not elsewhere classified, initial encounter: Secondary | ICD-10-CM

## 2011-01-24 DIAGNOSIS — I10 Essential (primary) hypertension: Secondary | ICD-10-CM

## 2011-01-24 DIAGNOSIS — I2589 Other forms of chronic ischemic heart disease: Secondary | ICD-10-CM

## 2011-01-24 LAB — CBC WITH DIFFERENTIAL/PLATELET
Basophils Absolute: 0 10*3/uL (ref 0.0–0.1)
Hemoglobin: 12.2 g/dL — ABNORMAL LOW (ref 13.0–17.0)
Lymphocytes Relative: 22.8 % (ref 12.0–46.0)
Monocytes Relative: 7.9 % (ref 3.0–12.0)
Neutro Abs: 3.7 10*3/uL (ref 1.4–7.7)
Platelets: 195 10*3/uL (ref 150.0–400.0)
RDW: 13.9 % (ref 11.5–14.6)
WBC: 5.5 10*3/uL (ref 4.5–10.5)

## 2011-01-24 LAB — PROTIME-INR: INR: 0.9 ratio (ref 0.8–1.0)

## 2011-01-24 LAB — BASIC METABOLIC PANEL
CO2: 30 mEq/L (ref 19–32)
Chloride: 103 mEq/L (ref 96–112)
Potassium: 4.8 mEq/L (ref 3.5–5.1)
Sodium: 138 mEq/L (ref 135–145)

## 2011-01-26 ENCOUNTER — Ambulatory Visit (HOSPITAL_COMMUNITY)
Admission: RE | Admit: 2011-01-26 | Discharge: 2011-01-26 | Disposition: A | Discharge status: Home / Self Care | Payer: Medicare Other | Source: Ambulatory Visit | Attending: Internal Medicine | Admitting: Internal Medicine

## 2011-01-26 ENCOUNTER — Other Ambulatory Visit (INDEPENDENT_AMBULATORY_CARE_PROVIDER_SITE_OTHER): Payer: Self-pay | Admitting: Internal Medicine

## 2011-01-26 ENCOUNTER — Encounter (HOSPITAL_BASED_OUTPATIENT_CLINIC_OR_DEPARTMENT_OTHER): Payer: Medicare Other | Admitting: Internal Medicine

## 2011-01-26 DIAGNOSIS — K257 Chronic gastric ulcer without hemorrhage or perforation: Secondary | ICD-10-CM | POA: Insufficient documentation

## 2011-01-26 DIAGNOSIS — K921 Melena: Secondary | ICD-10-CM

## 2011-01-26 DIAGNOSIS — R1310 Dysphagia, unspecified: Secondary | ICD-10-CM

## 2011-01-26 DIAGNOSIS — A048 Other specified bacterial intestinal infections: Secondary | ICD-10-CM | POA: Insufficient documentation

## 2011-01-26 DIAGNOSIS — K2960 Other gastritis without bleeding: Secondary | ICD-10-CM

## 2011-01-26 DIAGNOSIS — K449 Diaphragmatic hernia without obstruction or gangrene: Secondary | ICD-10-CM | POA: Insufficient documentation

## 2011-01-26 DIAGNOSIS — K2100 Gastro-esophageal reflux disease with esophagitis, without bleeding: Secondary | ICD-10-CM | POA: Insufficient documentation

## 2011-01-26 DIAGNOSIS — K222 Esophageal obstruction: Secondary | ICD-10-CM | POA: Insufficient documentation

## 2011-01-26 DIAGNOSIS — Z7982 Long term (current) use of aspirin: Secondary | ICD-10-CM | POA: Insufficient documentation

## 2011-01-28 ENCOUNTER — Telehealth: Payer: Self-pay | Admitting: Internal Medicine

## 2011-01-28 NOTE — Telephone Encounter (Signed)
Pt's wife calling stating husband having procedure Monday 4/16-she is calling to see if labs OK to proceed with surgery--advised labs are good, plan to come to hospital as sched on 4/16--nt

## 2011-01-28 NOTE — Telephone Encounter (Signed)
Pt wife calling re blood work,

## 2011-01-31 ENCOUNTER — Inpatient Hospital Stay (HOSPITAL_COMMUNITY)
Admission: RE | Admit: 2011-01-31 | Discharge: 2011-02-03 | DRG: 245 | Disposition: A | Discharge status: Home / Self Care | Payer: Medicare Other | Source: Ambulatory Visit | Attending: Internal Medicine | Admitting: Internal Medicine

## 2011-01-31 ENCOUNTER — Inpatient Hospital Stay (HOSPITAL_COMMUNITY): Payer: Medicare Other

## 2011-01-31 ENCOUNTER — Ambulatory Visit (HOSPITAL_COMMUNITY): Payer: Medicare Other

## 2011-01-31 DIAGNOSIS — Y9210 Unspecified residential institution as the place of occurrence of the external cause: Secondary | ICD-10-CM | POA: Diagnosis not present

## 2011-01-31 DIAGNOSIS — I509 Heart failure, unspecified: Secondary | ICD-10-CM | POA: Diagnosis present

## 2011-01-31 DIAGNOSIS — Y849 Medical procedure, unspecified as the cause of abnormal reaction of the patient, or of later complication, without mention of misadventure at the time of the procedure: Secondary | ICD-10-CM | POA: Diagnosis not present

## 2011-01-31 DIAGNOSIS — I658 Occlusion and stenosis of other precerebral arteries: Secondary | ICD-10-CM | POA: Diagnosis present

## 2011-01-31 DIAGNOSIS — Z01810 Encounter for preprocedural cardiovascular examination: Secondary | ICD-10-CM

## 2011-01-31 DIAGNOSIS — I4729 Other ventricular tachycardia: Secondary | ICD-10-CM | POA: Diagnosis present

## 2011-01-31 DIAGNOSIS — Z86711 Personal history of pulmonary embolism: Secondary | ICD-10-CM

## 2011-01-31 DIAGNOSIS — J449 Chronic obstructive pulmonary disease, unspecified: Secondary | ICD-10-CM | POA: Diagnosis present

## 2011-01-31 DIAGNOSIS — I252 Old myocardial infarction: Secondary | ICD-10-CM

## 2011-01-31 DIAGNOSIS — J95811 Postprocedural pneumothorax: Secondary | ICD-10-CM | POA: Diagnosis not present

## 2011-01-31 DIAGNOSIS — I359 Nonrheumatic aortic valve disorder, unspecified: Secondary | ICD-10-CM | POA: Diagnosis present

## 2011-01-31 DIAGNOSIS — I472 Ventricular tachycardia, unspecified: Secondary | ICD-10-CM | POA: Diagnosis present

## 2011-01-31 DIAGNOSIS — Z01812 Encounter for preprocedural laboratory examination: Secondary | ICD-10-CM

## 2011-01-31 DIAGNOSIS — I2789 Other specified pulmonary heart diseases: Secondary | ICD-10-CM | POA: Diagnosis present

## 2011-01-31 DIAGNOSIS — I6529 Occlusion and stenosis of unspecified carotid artery: Secondary | ICD-10-CM | POA: Diagnosis present

## 2011-01-31 DIAGNOSIS — I428 Other cardiomyopathies: Secondary | ICD-10-CM

## 2011-01-31 DIAGNOSIS — I2510 Atherosclerotic heart disease of native coronary artery without angina pectoris: Secondary | ICD-10-CM | POA: Diagnosis present

## 2011-01-31 DIAGNOSIS — J4489 Other specified chronic obstructive pulmonary disease: Secondary | ICD-10-CM | POA: Diagnosis present

## 2011-01-31 DIAGNOSIS — E785 Hyperlipidemia, unspecified: Secondary | ICD-10-CM | POA: Diagnosis present

## 2011-01-31 DIAGNOSIS — I2589 Other forms of chronic ischemic heart disease: Principal | ICD-10-CM | POA: Diagnosis present

## 2011-01-31 DIAGNOSIS — Z01818 Encounter for other preprocedural examination: Secondary | ICD-10-CM

## 2011-01-31 DIAGNOSIS — I447 Left bundle-branch block, unspecified: Secondary | ICD-10-CM | POA: Diagnosis present

## 2011-01-31 LAB — SURGICAL PCR SCREEN
MRSA, PCR: NEGATIVE
Staphylococcus aureus: NEGATIVE

## 2011-01-31 LAB — GLUCOSE, CAPILLARY: Glucose-Capillary: 81 mg/dL (ref 70–99)

## 2011-02-01 ENCOUNTER — Inpatient Hospital Stay (HOSPITAL_COMMUNITY): Payer: Medicare Other

## 2011-02-01 DIAGNOSIS — J95811 Postprocedural pneumothorax: Secondary | ICD-10-CM

## 2011-02-01 LAB — BASIC METABOLIC PANEL
Calcium: 8.7 mg/dL (ref 8.4–10.5)
GFR calc non Af Amer: >60 mL/min (ref >60)
Potassium: 4.3 mEq/L (ref 3.5–5.1)
Sodium: 143 mEq/L (ref 135–145)

## 2011-02-01 LAB — CBC
Platelets: 197 10*3/uL (ref 150–400)
RDW: 13 % (ref 11.5–15.5)
WBC: 6.5 10*3/uL (ref 4.0–10.5)

## 2011-02-02 ENCOUNTER — Inpatient Hospital Stay (HOSPITAL_COMMUNITY): Payer: Medicare Other

## 2011-02-02 DIAGNOSIS — J95811 Postprocedural pneumothorax: Secondary | ICD-10-CM

## 2011-02-03 ENCOUNTER — Inpatient Hospital Stay (HOSPITAL_COMMUNITY): Payer: Medicare Other

## 2011-02-03 ENCOUNTER — Other Ambulatory Visit (INDEPENDENT_AMBULATORY_CARE_PROVIDER_SITE_OTHER): Payer: Self-pay | Admitting: Internal Medicine

## 2011-02-03 LAB — GLUCOSE, CAPILLARY: Glucose-Capillary: 88 mg/dL (ref 70–99)

## 2011-02-03 NOTE — Telephone Encounter (Signed)
Labs ok.

## 2011-02-04 ENCOUNTER — Other Ambulatory Visit (INDEPENDENT_AMBULATORY_CARE_PROVIDER_SITE_OTHER): Payer: Self-pay | Admitting: Internal Medicine

## 2011-02-04 ENCOUNTER — Telehealth: Payer: Self-pay | Admitting: Internal Medicine

## 2011-02-04 NOTE — Telephone Encounter (Signed)
Pt wife states pt device is beeping and would to talk to someone in the device clinic.

## 2011-02-07 ENCOUNTER — Other Ambulatory Visit (HOSPITAL_COMMUNITY): Payer: Medicare Other

## 2011-02-07 ENCOUNTER — Ambulatory Visit (HOSPITAL_COMMUNITY)
Admission: RE | Admit: 2011-02-07 | Discharge: 2011-02-07 | Disposition: A | Discharge status: Home / Self Care | Payer: Medicare Other | Source: Ambulatory Visit | Attending: Internal Medicine | Admitting: Internal Medicine

## 2011-02-07 ENCOUNTER — Other Ambulatory Visit: Payer: Self-pay | Admitting: Surgery

## 2011-02-07 DIAGNOSIS — Z85850 Personal history of malignant neoplasm of thyroid: Secondary | ICD-10-CM | POA: Insufficient documentation

## 2011-02-07 DIAGNOSIS — R1310 Dysphagia, unspecified: Secondary | ICD-10-CM | POA: Insufficient documentation

## 2011-02-07 DIAGNOSIS — J930 Spontaneous tension pneumothorax: Secondary | ICD-10-CM

## 2011-02-08 ENCOUNTER — Ambulatory Visit
Admission: RE | Admit: 2011-02-08 | Discharge: 2011-02-08 | Disposition: A | Discharge status: Home / Self Care | Payer: Medicare Other | Source: Ambulatory Visit | Attending: Surgery | Admitting: Surgery

## 2011-02-08 ENCOUNTER — Encounter (INDEPENDENT_AMBULATORY_CARE_PROVIDER_SITE_OTHER): Payer: Medicare Other | Admitting: Surgery

## 2011-02-08 ENCOUNTER — Encounter: Payer: Medicare Other | Admitting: Surgery

## 2011-02-08 DIAGNOSIS — J930 Spontaneous tension pneumothorax: Secondary | ICD-10-CM

## 2011-02-09 ENCOUNTER — Ambulatory Visit (INDEPENDENT_AMBULATORY_CARE_PROVIDER_SITE_OTHER): Payer: Medicare Other | Admitting: *Deleted

## 2011-02-09 DIAGNOSIS — I509 Heart failure, unspecified: Secondary | ICD-10-CM

## 2011-02-09 DIAGNOSIS — I5022 Chronic systolic (congestive) heart failure: Secondary | ICD-10-CM

## 2011-02-09 DIAGNOSIS — I2589 Other forms of chronic ischemic heart disease: Secondary | ICD-10-CM

## 2011-02-09 NOTE — Assessment & Plan Note (Signed)
OFFICE VISIT  KAMIR, SELOVER DOB:  April 19, 1941                                        February 08, 2011 CHART #:  [id number]  The patient returned to my office today for followup status post left chest tube insertion for a left pneumothorax following AICD generator replacement and attempted lead revision by Dr. Lewayne Bunting.  This was his second left pneumothorax following a procedure on his defibrillator. His pneumothorax resolved immediately and he had no significant air leak.  Tube was removed and he was sent home.  Since discharge, he has had no cough or sputum production.  He denies any shortness of breath.  PHYSICAL EXAMINATION:  Vital Signs:  Blood pressure 138/80, pulse 66 and regular, respiratory rate is 20 and unlabored, oxygen saturation on room air is 95%.  General:  He looks well.  Lung:  Diffusely decreased breath sounds that are equal on both sides consistent with COPD.  His chest tube site is well-healed.  The suture was removed.  Followup chest x-ray today shows no pneumothorax or effusion.  IMPRESSION:  The patient has had no recurrence of his pneumothorax following chest tube removal.  He will follow up with Dr. Lewayne Bunting tomorrow concerning his defibrillator and I will be happy to see him back if he develops any recurrent pneumothorax.  Evelene Croon, M.D. Electronically Signed  BB/MEDQ  D:  02/08/2011  T:  02/09/2011  Job:  [id number]

## 2011-02-12 NOTE — Op Note (Signed)
[name], [name]               ACCOUNT NO.:  [id number]  MEDICAL RECORD NO.:  [id number]           PATIENT TYPE:  O  LOCATION:  DAYP                          FACILITY:  [facility]  PHYSICIAN:  Lionel December, M.D.    DATE OF BIRTH:  1941-08-15  DATE OF PROCEDURE:  01/26/2011 DATE OF DISCHARGE:                               PROCEDURE NOTE   PROCEDURE:  Esophagogastroduodenoscopy with esophageal dilation.  INDICATIONS:  [name] is 70 year old Caucasian male with multiple medical problems.  He recently experienced melena with a drop in his hemoglobin. He also complains of dysphagia to solids and his pills.  He is on asa 325 mg daily.  He does not take any NSAIDs.  He is undergoing diagnostic/therapeutic procedure.  Procedures were reviewed with the patient.  Informed consent was obtained.  MEDS FOR CONSCIOUS SEDATION:  Cetacaine spray for oropharyngeal topical anesthesia, Demerol 25 mg IV, Versed 4 mg IV.  FINDINGS:  Procedure performed in endoscopy suite.  The patient's vital signs and O2 sat were monitored during procedure and remained stable. The patient was placed in left lateral recumbent position and Pentax videoscope was passed through oropharynx without any difficulty into esophagus.  Esophagus.  Mucosa of the esophagus normal.  Schatzki's ring noted at GE junction which was at 40 cm.  Mucosa in the gastric site GE junction revealed edema, erythema.  Hiatus was at 42 cm.  Stomach.  It was empty and distended very well by insufflation.  Folds of proximal stomach are normal.  Examination of mucosa revealed a large patch of erythematous mucosa and gastric antrum towards the anterior wall with 4-5 mm ulcer with a clean base.  Pyloric channel was patent. Angularis, fundus, and cardia were examined by retroflexion of scope and were normal.  Duodenum.  Bulbar mucosa was normal.  Scope was passed to second part of duodenal mucosa and folds were normal.  Endoscope was  withdrawn.  Esophagus was dilated by passing 54-French Maloney dilator to full insertion.  As the dilator was withdrawn, endoscope was passed again, a ring was noted to have been disrupted at one spot.  Bleeding was further disrupted with focal biopsy, but this tissue was not safe.  Endoscope was introduced in the stomach and biopsies were taken from this mucosa surrounding this ulcer, endoscope was withdrawn.  The patient tolerated the procedure well.  FINAL DIAGNOSES: 1. Schatzki's ring disrupted by passing 54-French Maloney     dilator and focal biopsy. 2. Margins of reflux esophagitis limited to GE junction. 3. A 2-cm size sliding hiatal hernia. 4. A 4-5 mm gastric ulcer at antrum in the background of area with     gastritis.  This was biopsied for histology.  RECOMMENDATIONS: 1. We will start him on pantoprazole 40 mg p.o. b.i.d. prescription     given for 60 with two refills. 2. I would like for him to check with Dr. Andee Lineman, if aspirin dose     could be reduced maybe 162 mg daily.  I will be contacting patient with results of biopsy and would like to see him back in the office in 8 weeks.  Lionel December, M.D.     NR/MEDQ  D:  01/26/2011  T:  01/26/2011  Job:  [id number]  cc:   Doreen Beam, MD Fax: [phone]  Learta Codding, MD,FACC [address] [place], [place] [address]  Electronically Signed by Lionel December M.D. on 02/12/2011 12:38:28 PM

## 2011-02-14 NOTE — Op Note (Signed)
NAMEBERK, PILOT               ACCOUNT NO.:  [id number]  MEDICAL RECORD NO.:  [id number]           PATIENT TYPE:  I  LOCATION:  2902                         FACILITY:  [facility]  PHYSICIAN:  Doylene Canning. Ladona Ridgel, MD    DATE OF BIRTH:  [birth year]  DATE OF PROCEDURE:  01/31/2011 DATE OF DISCHARGE:                              OPERATIVE REPORT   PROCEDURE PERFORMED:  Implantable cardioverter-defibrillator generator removal, attempted insertion of a new device with left upper extremity venogram with insertion of a new device with implantable cardioverter- defibrillator pocket revision and defibrillation threshold testing.  INTRODUCTION:  The patient is a 70 year old male with longstanding nonischemic cardiomyopathy, congestive heart failure, left bundle-branch block, and ventricular tachycardia.  He is status post BiV ICD insertion.  His defibrillator lead which was a 6949 device has been found to have intermittently increased pacing threshold with stable impedances and he is now referred for device generator change with insertion of a new lead if at all possible.  PROCEDURE:  After informed consent was obtained, the patient was taken to the Diagnostic EP Lab in a fasting state.  After usual preparation and draping, intravenous fentanyl and midazolam was given for sedation. 30 mL of lidocaine was infiltrated into the left infraclavicular region. A 6-cm incision was carried out over this region.  Electrocautery was utilized to dissect down to the fascial plane.  The left subclavian vein was difficult to puncture and venography was then carried out which demonstrated that the vein was occluded.  Despite that the vein was punctured on several occasions and multiple wires were utilized to try to traverse the occlusion, the patient's initial occlusion in the left subclavian vein was traversed with modest difficulty, but there was a second occlusion at the junction between the innominate vein  and the superior vena cava which could not be traversed.  After approximately 45 minutes of attempting to get by this occlusion, it was deemed most appropriate to try to reconfigure the new device.  At this point, electrocautery was utilized to dissect down to the fascial plane and the ICD generator was removed with gentle traction.  The pocket was expanded and the fibrous adhesions were freed up with electrocautery.  The old LV lead which was a model 4194 88-mm lead was placed in the new RV rate sense port on the right ventricle.  The atrial lead was working normally and placing the atrial port the rate sense RV lead was placed in the LV port and the RV and SVC coils were also placed in the usual manner.  At this point, the pocket was irrigated with antibiotic irrigation and the Medtronic BiV ICD, serial number [id number] was placed in the subcutaneous pocket.  The pocket was irrigated and the patient was more deeply sedated for defibrillation threshold testing.  After the patient was more deeply sedated with fentanyl and Versed, VF was induced with T-wave shock.  A 15-joule shock was delivered which terminated VF and restored sinus rhythm.  At this point, no additional defibrillation threshold testing was carried out and the incision was closed with 2-0 and 3-0 O Vicryl.  Benzoin  and Steri-Strips were painted on the skin.  A pressure dressing was applied.  The patient was returned to his room in satisfactory condition.  COMPLICATIONS:  There were no immediate procedural complications.  RESULTS:  This demonstrates successful removal of previously implanted BiV ICD which had reached elective replacement.  It demonstrates unsuccessful attempt at placing a rate sense lead because of an occluded innominate vein with defibrillation pocket revision and defibrillation threshold testing and insertion of a new BiV ICD.     Doylene Canning. Ladona Ridgel, MD     GWT/MEDQ  D:  01/31/2011  T:   02/01/2011  Job:  [id number]  Electronically Signed by Lewayne Bunting MD on 02/14/2011 07:58:28 AM

## 2011-02-14 NOTE — Discharge Summary (Signed)
[name], BRODRICK               ACCOUNT NO.:  [id number]  MEDICAL RECORD NO.:  [id number]           PATIENT TYPE:  I  LOCATION:  2023                         FACILITY:  [facility]  PHYSICIAN:  Doylene Canning. Ladona Ridgel, MD    DATE OF BIRTH:  [birth year]  DATE OF ADMISSION:  01/31/2011 DATE OF DISCHARGE:  02/03/2011                              DISCHARGE SUMMARY   PRIMARY CARE PHYSICIAN:  Doreen Beam, MD  PRIMARY CARDIOLOGIST:  Learta Codding, MD,FACC  ELECTROPHYSIOLOGIST:  Doylene Canning. Ladona Ridgel, MD  PRIMARY DIAGNOSES:  Ischemic cardiomyopathy and congestive heart failure.  SECONDARY DIAGNOSES: 1. Ventricular tachycardia. 2. History of pulmonary embolism. 3. Chronic obstructive pulmonary disease. 4. Coronary artery disease - status post non-ST elevation myocardial     infarction with a stent to the right coronary artery. 5. Carotid artery stenosis.  The patient has a 50% stenosis     bilaterally. 6. Moderate aortic insufficiency. 7. Dyslipidemia. 8. Pulmonary hypertension.  DEVICE HISTORY: 1. Implantation of a dual-chamber ICD in August 2005 after having     inducible polymorphic ventricular tachycardia. 2. Upgrade to a cardiac resynchronization therapy defibrillator in     October 2007. 3. CRTD generator change on January 31, 2011.  PROCEDURES THIS ADMISSION: 1. Cardiac resynchronization therapy, defibrillator generator change     on January 31, 2011.  The patient received a Medtronic model #[id number]     defibrillator.  The patient's previously implanted right atrial     model #5076, right ventricular model #6949, and model #4194 left     ventricular leads were used.  DFTs at the time of implant were less     than or equal to 15 joules.  The patient's right ventricular and     left ventricular leads were switched in the header because of the     patient's 6949 lead being on recall.  The patient also at that time     underwent attempted implantation of a new right ventricular lead.  However, this was unsuccessful because of left subclavian vein     occlusion.  The patient had no early immediate complications. 2. Insertion of a left chest tube on January 31, 2011 by Dr. Laneta Simmers  ALLERGIES:  The patient is allergic to ALDACTONE.  BRIEF HISTORY OF PRESENT ILLNESS:  [name] is a 70 year old male with a history of ischemic cardiomyopathy, congestive heart failure, and ventricular tachycardia.  His first ICD was implanted in 2005, was subsequently upgraded to cardiac resynchronization therapy defibrillator in 2007 whose device recently reached elective replacement indicator. The patient was evaluated by Dr. Ladona Ridgel in the office for generator change.  Risks, benefits, and expectations of this procedure were discussed and he wished to proceed.  Because of the patient's previously implanted 6949 right ventricular lead which is now on manufacture recall, the plan was attempting to insert a new right ventricular lead at change out.  HOSPITAL COURSE:  The patient was admitted on January 31, 2011 for planned generator change and potential RV lead implantation by Dr. Ladona Ridgel.  This procedure was carried out with details outlined above.  Shortly after the procedure, the patient  began to complain of chest pain and shortness of breath.  Chest x-ray was obtained which demonstrated a large pneumothorax.  TCTS was consulted who placed a left chest tube.  The patient was monitored on telemetry.  He had minimal drainage through his chest tube on February 02, 2011 and his chest tube was removed.  He was monitored for 1 more day and chest x-ray was obtained which demonstrated no pneumothorax.  The patient was evaluated by Dr. Ladona Ridgel on February 03, 2011 and considered stable for discharge.  FOLLOWUP APPOINTMENTS: 1. Chest x-ray and appointment with Dr. Laneta Simmers in 1 week to reevaluate     pneumothorax and have chest tube suture removed. 2. [facility] on February 09, 2011 a 2:30  p.m. 3. Dr. Andee Lineman as scheduled. 4. Dr. Sherril Croon as scheduled.  DISCHARGE INSTRUCTIONS: 1. Increase activity slowly. 2. Follow a low sodium heart healthy diet. 3. Keep incision clean and dry until February 07, 2011.  DISCHARGE MEDICATIONS: 1. Aspirin 81 mg daily. 2. Coreg 25 mg twice daily. 3. Digoxin 0.25 mg daily. 4. Eplerenone 25 mg daily. 5. Fish oil daily. 6. Furosemide 20 mg daily. 7. Levothyroxine 150 mcg daily. 8. Niacin 500 mg daily. 9. Simvastatin 20 mg daily. 10.Trandolapril 2 mg 1-1/2 tablet daily.  DISPOSITION:  The patient was seen and examined by Dr. Ladona Ridgel on February 03, 2011 and considered stable for discharge.  DURATION OF DISCHARGE ENCOUNTER:  135 minutes.     Gypsy Balsam, RN,BSN   ______________________________ Doylene Canning. Ladona Ridgel, MD    AS/MEDQ  D:  02/03/2011  T:  02/03/2011  Job:  [id number]  cc:   Evelene Croon, M.D. Doreen Beam, MD Learta Codding, MD,FACC  Electronically Signed by Gypsy Balsam RNBSN on 02/07/2011 08:14:53 AM Electronically Signed by Lewayne Bunting MD on 02/14/2011 07:58:22 AM

## 2011-03-01 NOTE — Op Note (Signed)
[name], [name]               ACCOUNT NO.:  [id number]   MEDICAL RECORD NO.:  [id number]          PATIENT TYPE:  AMB   LOCATION:  SDS                          FACILITY:  [facility]   PHYSICIAN:  Thornton Park. Daphine Deutscher, MD  DATE OF BIRTH:  1940-10-23   DATE OF PROCEDURE:  07/02/2007  DATE OF DISCHARGE:                               OPERATIVE REPORT   PREOPERATIVE DIAGNOSIS:  Left breast mass.   POSTOPERATIVE DIAGNOSIS:  Left breast mass, probable gynecomastia.   PROCEDURE:  Left breast biopsy.   SURGEON:  Thornton Park. Daphine Deutscher, M.D.   ANESTHESIA:  MAC.   DESCRIPTION OF PROCEDURE:  [name] was taken to Room 10 at [facility] on  July 02, 2007, given intravenous sedation.  The nipple and areolar  area was shaved and prepped with Techni-Care and draped sterilely.  I  had a sterile magnet to put over the medial edge of the defibrillator  when I was using the bovie, then made a small curvilinear incision down  into the retroareolar dissection, excising about 1.5 cm in diameter  sphere of breast tissue.  This looked like gynecomastia and was really  no other appreciable tissue that I could feel.  I then put the magnet on  and used electrocautery to cauterize.  I then closed 4-0 and 5-0 Vicryl.  Benzoin Steri-Strips, sterile dressings applied.  The patient was taken  to the recovery room.  He was  given Vicodin to take for pain and I ask  him to return to the office in about 3-4 weeks.  I asked him to return  to my office in about 3 days for the final pathology report.      Thornton Park Daphine Deutscher, MD  Electronically Signed     MBM/MEDQ  D:  07/02/2007  T:  07/03/2007  Job:  [id number]

## 2011-03-01 NOTE — Assessment & Plan Note (Signed)
[facility]                          [facility] OFFICE NOTE   NAME:[name], [name]                      MRN:          [id number]  DATE:08/07/2007                            DOB:          11-23-1940    REFERRING PHYSICIAN:  Dhruv Vyas   HISTORY OF PRESENT ILLNESS:  The patient is a 70 year old male with a  history of ischemic cardiomyopathy status post ICD implant.  The patient  has been doing quite well after CRTD.  He reports no shortness of  breath, orthopnea, or PND.  He is able to work in his yard without any  difficulty.  The patient was previously on spirolactone and this caused  painful gynecomastia.  He underwent, also, breast biopsy by Dr. Daphine Deutscher  and this was within normal limits.  His symptoms have now resolved after  he switched to eplerenone.   MEDICATIONS:  1. Eplerenone 25 mg daily.  2. Simvastatin 40 mg p.o. daily.  3. Enteric-coated aspirin 325 daily.  4. Coreg 25 mg p.o. b.i.d.  5. Synthroid 150 mcg p.o. daily.  6. Digoxin 0.5 mg p.o. daily.  7. Lasix 20 mg p.o. daily.  8. Trandolapril 2 mg 1-1/2 tablets p.o. daily.  9. Niacin 5 mg 2 tablets daily.   PHYSICAL EXAMINATION:  VITAL SIGNS:  Blood pressure 120/65, heart rate  56, weight 174 pounds.  NECK:  Normal carotid upstroke and no carotid bruits.  LUNGS:  Clear breath sounds bilaterally.  HEART:  Regular rate and rhythm with normal S1, S2.  No murmurs, rubs,  or gallops.  ABDOMEN:  Soft and nontender.  No rebound or guarding.  Good bowel  sounds.  EXTREMITIES:  No cyanosis, clubbing, or edema.  NEURO:  The patient is alert and oriented, grossly nonfocal.   PROBLEM LIST:  1. Severe ischemic cardiomyopathy.      a.     Ejection fraction 20-25% by 2D echo in March 2007.      b.     Non-ST-elevation myocardial infarction with stent to the       right coronary artery February 2003.      c.     Nonischemic adenosine perfusion study with large fixed       inferior and  inferoseptal defect, ejection fraction 26% March       2007.  2. History of additional polymorphic ventricular tachycardia.      a.     Status post Medtronic defibrillator implant August 2005.      b.     Status post cardiac resynchronization therapy defibrillator       upgrade October 2007 by Dr. Ladona Ridgel.  3. Dyslipidemia.  4. Status post painful gynecomastia secondary to aldactone.  5. Dyslipidemia.  6. Left carotid bruit.  7. Pulmonary embolism.  8. Valvular heart disease.  9. Moderate aortic insufficiency.  10.Pulmonary hypertension.   PLAN:  1. The patient is doing well.  In 6 months will obtain a repeat      echographic study.  2. I will make no change in the patient's medical regimen.  3. The patient's EKG was reviewed  and he appears to be in AV      sequential pacing.     Learta Codding, MD,FACC  Electronically Signed    GED/MedQ  DD: 08/10/2007  DT: 08/10/2007  Job #: [id number]

## 2011-03-01 NOTE — Assessment & Plan Note (Signed)
[facility]                         ELECTROPHYSIOLOGY OFFICE NOTE   NAME:[name], Andrews                      MRN:          [id number]  DATE:04/28/2008                            DOB:          1941/07/02    [name] returns today for followup after a year's absence from the EP  Clinic.  He is a very pleasant male with ischemic cardiomyopathy status  post VF arrest and history of congestive heart failure status post ICD  insertion.  The patient was bothered by tenderness in his breast  bilaterally and was found to have gynecomastia from his Aldactone and  with discontinuation of this drug resulted in discontinuation of his  discomfort in his breast tissue.  He returns today for followup.  The  patient denies chest pain.  He denies shortness of breath.  He had no  specific complaints today.   MEDICATIONS:  1. Aspirin 325 a day.  2. Coreg 25 twice a day.  3. Synthroid 150 mcg daily.  4. Digoxin 0.25 daily.  5. Lasix 20 a day.  6. Niacin 500 a day.  7. Simvastatin 20 a day.  8. Eplerenone 25 a day.   PHYSICAL EXAMINATION:  GENERAL:  He is a pleasant, middle-aged man in no  distress.  VITAL SIGNS:  Blood pressure was 113/61, pulse 56 and regular,  respirations were 18, and weight was 152 pounds.  NECK:  No jugular venous distention.  LUNGS:  Clear bilaterally to auscultation.  No wheezes, rales, or  rhonchi are present.  CARDIOVASCULAR:  Regular bradycardia.  Normal S1 and S2.  I do not  appreciate any murmurs today.  ABDOMEN:  Soft and nontender.  EXTREMITIES:  No edema.   IMPRESSION:  1. Ischemic cardiomyopathy.  2. Congestive heart failure.  3. Status post ICD insertion.   DISCUSSION:  Overall, [name] is stable.  His defibrillator is  working normally.  We will plan to see the patient back in the office  for followup in our ICD Clinic in a year.  He is instructed to continue  his current medical therapy and maintain a low-sodium  diet.     Doylene Canning. Ladona Ridgel, MD  Electronically Signed    GWT/MedQ  DD: 04/28/2008  DT: 04/29/2008  Job #: [id number]   cc:   Doreen Beam, MD

## 2011-03-01 NOTE — Assessment & Plan Note (Signed)
[facility]                          [facility]   [name], [name]                      MRN:          [id number]  DATE:04/11/2008                            DOB:          December 21, 1940    CARDIOLOGIST:  [name] Codding, MD, [facility]   PRIMARY CARE PHYSICIAN:  [name] Beam, MD   REASON FOR VISIT:  Eight-month followup.   HISTORY OF PRESENT ILLNESS:  [name] is a 70 year old male patient  with a history of ischemic cardiomyopathy status post CRT/AICD  implantation who returns to the office today for followup.  Overall, he  is doing well.  He denies any significant shortness of breath or chest  pain.  He denies any syncope or near-syncope.  He denies any ICD  therapies.  He denies orthopnea, PND, or pedal edema.  He did recently  have some URI symptoms.  He saw Dr. Sherril Croon and was treated with Z-Pak.  He  did have some mild pedal edema associated with this.  The symptoms are  resolving.  He does have some residual cough that is productive of scant  sputum at times.  This is green at times.  He denies any hemoptysis.   CURRENT MEDICATIONS:  1. Eplerenone 25 mg daily.  2. Simvastatin 40 mg daily.  3. Niacin 500 mg daily.  4. Fish oil 1000 mg daily.  5. Aspirin 325 mg daily.  6. Coreg 25 mg b.i.d.  7. Synthroid 0.15 mg daily.  8. Digoxin 0.25 mg daily.  9. Lasix 20 mg daily.  10.Trandolapril 2 mg 1-1/2 tablets daily.   PHYSICAL EXAMINATION:  GENERAL:  He is a well-nourished, well-developed  male, in no acute distress.  VITAL SIGNS:  Blood pressure 128/66, pulse 51, and weight 148.2 pounds.  HEENT:  Normal.  NECK:  Without JVD.  LYMPHATIC:  Without lymphadenopathy.  CARDIAC:  Normal S1 and S2.  Regular rate and rhythm.  A 2/6 diastolic  murmur heard best along the left sternal border.  LUNGS:  Clear to auscultation bilaterally.  ABDOMEN:  Soft and nontender.  EXTREMITIES:  Without edema.  NEUROLOGIC:  He is alert and oriented x3.   Cranial nerves II-XII are  grossly intact.  VASCULAR:  Without carotid bruits bilaterally.   IMPRESSION:  1. Ischemic cardiomyopathy with an ejection fraction of 20-25%.      a.     Chronic systolic congestive heart failure with [place]       Heart Association Class II symptoms.      b.     Spironolactone discontinued secondary to painful       gynecomastia.  2. Coronary artery disease.      a.     Status post non-ST-elevation myocardial infarction, February       2003, treated with a stent to the right coronary artery.      b.     Nonischemic adenosine Cardiolite study, March 2007.  3. History of inducible polymorphic ventricular tachycardia.      a.     Status post Medtronic implantable cardioverter-defibrillator       implantation,  August 2005.      b.     Status post upgrade with cardiac resynchronization       therapy/implantable cardioverter-defibrillator, October 2007.  4. Carotid artery stenosis.      a.     Dopplers, February 2008:  Less than 50% stenosis       bilaterally.  5. Prior history of pulmonary embolism.  6. Moderate aortic insufficiency.  7. Dyslipidemia.      a.     Lipid panel, April 2009; total cholesterol 161,       triglycerides 131, HDL 24, and LDL 75.  8. Pulmonary hypertension (pulmonary artery systolic pressure of 55      mmHg, March 2007).   PLAN:  1. [name] presents to the office today for followup.  Overall, he      is doing well from a congestive heart failure standpoint.  He is      optivolemic on exam.  He did have recent URI symptoms, but these      are resolving.  No medication changes will be made today.  2. He does have a history of aortic insufficiency.  It has been 2      years since his last echocardiogram.  We will set him up for a      relook echocardiogram to reassess his aortic valve and LV function.  3. The patient will need a BMET given his eplerenone therapy.  4. I have asked him to try to increase his niacin to 1 g daily.   If he      can tolerate this, we will check lipids and LFTs in 4 months.  5. The patient will follow up in the next 6 months or sooner p.r.n.      Tereso Newcomer, PA-C  Electronically Signed      [name] Codding, MD,FACC  Electronically Signed   SW/MedQ  DD: 04/11/2008  DT: 04/12/2008  Job #: [id number]   cc:   [name] Beam, MD

## 2011-03-01 NOTE — Assessment & Plan Note (Signed)
[facility]                          [facility] NOTE   NAME:[name], [name]                      MRN:          [id number]  DATE:12/26/2008                            DOB:          1941/01/09    HISTORY OF PRESENT ILLNESS:  The patient is a 70 year old male with  history of ischemic cardiomyopathy, status post CRTD.  He has been doing  well.  He is in NYHA class IIB/III.  He was also followed in the EP  program.  There really has not been no change in his symptoms.  He  develops symptoms.  He develops dyspnea on moderate exertion.  He has  complained of no chest pain.  His vital signs have remained stable.  He  has no orthopnea, PND, palpitations, or syncope.   MEDICATIONS:  1. Carvedilol 25 mg p.o. b.i.d.  2. Digoxin 0.25 daily.  3. Levothyroxine 150 mcg p.o. daily.  4. Eplerenone 25 mg p.o. daily.  5. Furosemide 20 mg p.o. daily.  6. Trandolapril 2 mg half-tablet p.o. daily.  7. Simvastatin 20 mg p.o. nightly.  8. Aspirin 325 daily.  9. Fish oil 1000 mg p.o. daily.  10.Niacin 500 mg p.o. daily.   PHYSICAL EXAMINATION:  VITAL SIGNS:  Blood pressure 128/70, heart rate  60, weight 145 pounds and stable.  HEENT:  Normal.  NECK:  Without JVD.  LYMPHATIC:  No adenopathy.  CARDIAC:  Normal S1 and S2.  Regular rate and rhythm.  2/6 diastolic  murmur heard best along the left sternal border.  LUNGS:  Clear to auscultation bilaterally.  ABDOMEN:  Soft and nontender.  EXTREMITY:  No cyanosis, clubbing, or edema.  NEUROLOGIC:  The patient is alert and oriented.  Grossly nonfocal.  No  carotid bruits and peripheral pulses are palpable.   PROBLEM LIST:  1. Ischemic cardiomyopathy.  Ejection fraction 20-25%.  2. Painful gynecomastia secondary to spironolactone.  3. Coronary artery disease, status post non-ST-elevation myocardial      infarction with stent to the right coronary artery.  4. History of inducible polymorphic ventricular  tachycardia, status      post Medtronic implantable cardioverter-defibrillator in August      2005.  5. Status post upgrade with cardiac resynchronization therapy in      October 2007.  6. Carotid artery stenosis.  There was a 50% stenosis bilaterally.  7. Prior history of pulmonary embolism.  8. Moderate aortic insufficiency.  9. Dyslipidemia.  10.Pulmonary hypertension.   PLAN:  1. The patient is doing well.  I do not think we need to change any of      his medical therapy.  His EKG was reviewed in the office today and      demonstrates AV sequential pacing with biventricular pacing.  2. The patient will follow up with [place] in 6 months.     Learta Codding, MD,FACC  Electronically Signed    GED/MedQ  DD: 12/28/2008  DT: 12/29/2008  Job #: [id number]

## 2011-03-01 NOTE — Assessment & Plan Note (Signed)
[facility]                         ELECTROPHYSIOLOGY OFFICE NOTE   NAME:Campisi, [name]                      MRN:          [id number]  DATE:05/16/2007                            DOB:          1941/06/28    [name] returns today for unscheduled visit. He had been referred down  by Dr. Sherril Croon. He is a very pleasant male with a history of ischemic  cardiomyopathy status post VF arrest as well as a history of congestive  heart failure status post ICD insertion. The patient noted several days  ago have a lump on his breast near his nipple which was just below his  defibrillator site. He was seen by Dr. Sherril Croon and referred to a surgeon  but wanted to be seen today for a second opinion to make sure that the  lump was not somehow related to his ICD incision. He denies fevers or  chills. He notes that the lump has been present now for several days.   On physical exam, he is a pleasant, well-appearing man in no acute  distress. The blood pressure was 120/70, the pulse was 60 and regular,  the respirations were 18. The weight was 143 pounds.  NECK:  Revealed no jugular venous distention.  LUNGS:  Were clear bilaterally to auscultation. No wheezes, rales or  rhonchi were present.  CARDIOVASCULAR EXAM:  Revealed a regular rate and rhythm with a normal  S1 and S2.  Examination of the chest wall demonstrates a 2 x 2 cm nodule under the  skin which was mobile and not particularly hard and not tender to  palpate. There was no drainage or erythema around the site.   IMPRESSION:  1. Ischemic cardiomyopathy.  2. Status post VF arrest.  3. Status post BiV ICD insertion.   DISCUSSION:  Overall, [name] is stable, but this nodule is somewhat  concerning. I have recommended one of two options; one would be to  proceed immediately with surgical removal; secondly would be a period of  watchful waiting, and if resolution of the nodule occurs, then no  surgery required.  The patient has been referred to see Dr. Cleotis Nipper in  [place], and I think this is very reasonable. I have asked him to keep me  apprised of what the results of his biopsy are.     Doylene Canning. Ladona Ridgel, MD  Electronically Signed    GWT/MedQ  DD: 05/16/2007  DT: 05/17/2007  Job #: [id number]   cc:   Doreen Beam

## 2011-03-03 ENCOUNTER — Encounter: Payer: Medicare Other | Admitting: *Deleted

## 2011-03-04 NOTE — Op Note (Signed)
NAME:  [name], [name]                         ACCOUNT NO.:  [id number]   MEDICAL RECORD NO.:  [id number]                   PATIENT TYPE:  OIB   LOCATION:  5730                                 FACILITY:  [facility]   PHYSICIAN:  Velora Heckler, M.D.                DATE OF BIRTH:  1941/07/29   DATE OF PROCEDURE:  03/26/2004  DATE OF DISCHARGE:                                 OPERATIVE REPORT   PREOPERATIVE DIAGNOSIS:  Thyroid carcinoma.   POSTOPERATIVE DIAGNOSIS:  Thyroid carcinoma.   OPERATION PERFORMED:  Completion thyroidectomy (right thyroid lobe).   SURGEON:  Velora Heckler, M.D.   ASSISTANT:  Jimmye Norman, M.D.   ANESTHESIA:  General.   ESTIMATED BLOOD LOSS:  Minimal.   PREPARATION:  Betadine.   COMPLICATIONS:  None.   INDICATIONS FOR PROCEDURE:  The patient is a 70 year old white male who had  undergone neck exploration for left thyroid nodule.  He underwent left  thyroid lobectomy and isthmusectomy on February 13, 2004 at [facility].  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  There was a second 5 mm focus of follicular variant of papillary  thyroid carcinoma as well.  Surgical margins were negative.  After  discussion with Dorisann Frames, M.D. at [place] at [facility], decision was  made to proceed with completion thyroidectomy in preparation for treatment  with radioactive iodine.   DESCRIPTION OF PROCEDURE:  The procedure was done in operating room #16 at  the [facility].  The patient was brought to the  operating room and placed in supine position on the operating room table.  Following administration of general anesthesia, the patient was positioned  and then prepped and draped in the usual strict aseptic fashion.  After  ascertaining that an adequate level of anesthesia had been obtained, the  patient's previous Kocher incision was  reopened with a #10 blade.  Dissection was carried down through the skin and subcutaneous tissues.  Previous suture material was extracted.  Hemostasis was obtained with the  electrocautery.  Skin flaps were developed beneath the platysma cephalad and  caudad.  A Mahorner self-retaining retractor was placed for exposure.  Strap  muscles were incised in the midline.  Dissection was carried down to the  trachea.  The right thyroid lobe was identified.  Strap muscles were  reflected laterally.  Large venous tributaries were divided between medium  Ligaclips.  Gland was bluntly dissected out with a Barista.  Superior pole was ligated in continuity with 2-0 silk ties and divided  between medium Ligaclips.  The gland was rolled anteriorly.  Both superior  and inferior parathyroid glands were identified and preserved.  Branches of  the inferior thyroid artery were divided between small and medium Ligaclips.  Gland was rolled further anteriorly.  A tubercle of  thyroid tissue extends  well posteriorly, this was carefully dissected away from the inferior  parathyroid gland.  Ligament of Allyson Sabal was transected with the electrocautery  and the gland was rolled up and onto the anterior tracheal.  Gland was  excised off the trachea.  There is somewhat fibrous scarring between the  gland and the trachea but no evidence of tumor invasion.  Entire right  thyroid lobe was therefore excised  and submitted to pathology for review.  Good hemostasis was noted in the right neck.  The neck was irrigated with  warm saline.  Surgicel was placed over the area of the recurrent laryngeal  nerve.  Strap muscles were then reapproximated in the midline with  interrupted 3-0 Vicryl sutures.  Platysma was closed with interrupted 3-0  Vicryl sutures.  The skin was closed with a running 40 Vicryl subcuticular  suture.  The wound was washed and dried and benzoin and Steri-Strips.  Sterile dressings were applied.  The  patient was awakened from anesthesia  and brought to the recovery room in stable condition.  The patient tolerated  the procedure well.                                               Velora Heckler, M.D.    TMG/MEDQ  D:  03/26/2004  T:  03/26/2004  Job:  [id number]   cc:   Dorisann Frames, M.D.  [place] [place] [address]  Fax: [phone]

## 2011-03-04 NOTE — Assessment & Plan Note (Signed)
[place] [facility]                            [facility] NOTE   NAME:[name], [name]                      MRN:          [id number]  DATE:06/29/2006                            DOB:          [birth year]    HISTORY OF PRESENT ILLNESS:  The patient is a 70 year old male with a  history of mixed ischemic and nonischemic cardiomyopathy.  The patient has  been followed over the last several months for management of heart failure  symptoms.  In particular, the patient has reported increased fatigue and  weakness and some dyspnea on exertion.  Clinically, however, the patient has  no definite congestion.  In order to objectify the data, I had the patient  do a 6 minute walk.  He walked for 1380 feet, but did not experience any  desaturation.  However, today again in the office, he complains that he has  no stamina.  He feels drained of all energy, and he states, I feel drained  like a lemon.  Of note is that the patient, in the past, was tried on a  lower dose of Coreg, but this was associated with recurrence of  supraventricular tachycardia, which was documented previously by Dr. Ladona Ridgel.  The patient is status post invasive electrophysiological study and was found  to have inducible polymorphic VT as well as inducible AV node reentry  tachycardia.  The patient was referred for an ICD implantable dual chamber  function.  The patient did not undergo catheter ablation of his AV NRT at  that time.  Of note, also, the patient had an echocardiographic study done  on December 26, 2005, that showed an EF of 20-25% with diffuse global  hypokinesis and mild to moderate aortic insufficiency.  The patient has  known left bundle branch block which was documented from Dr. Lubertha Basque EP  study.  On current EKG, he has atrial pacing with left bundle branch  morphology.   The patient is currently on an optimal heart failure medical regimen, but  continues to be symptomatic  as outlined above.   MEDICATIONS:  1. Enteric coated aspirin 325 mg daily.  2. Lipitor 10 mg p.o. q.h.s.  3. Coreg 25 mg p.o. b.i.d.  4. Synthroid 150 mcg as directed.  5. Digoxin 0.25 mg daily.  6. Spironolactone 25 mg daily.  7. Mavik one and a half tablets of 2 mg daily.  8. Lasix 20 mg p.o. daily.  9. Niacin 5 mg p.o. daily.   PHYSICAL EXAMINATION:  VITAL SIGNS:  Blood pressure 100/52, heart rate 60  beats, weight 249 pounds.  NECK:  No carotid bruits.  LUNGS:  Clear breath sounds bilaterally.  HEART:  Regular rate and rhythm, normal S1 and a productively split second  heart sound.  PMI is displaced.  ABDOMEN:  Soft.  EXTREMITIES:  No edema.   PROBLEMS:  1. Coronary artery disease.      a.     Status post inferior wall myocardial infarction in 2003.      b.     Status post stented distal right coronary artery.  c.     Nonobstructive disease involving 50% LAD and 30% circumflex.      d.     Cardiolite December 27, 2005, ejection fraction 26% and diastolic       volume of 367 with severe left ventricular remodeling and a large       inferior scar but no ischemia.  2. Palpitations resolved on higher dose Coreg.  History of AV nodal      reentry tachycardia on EP study.  3. Nonischemic/ischemic cardiomyopathy.  NYJ Class 2B.  4. Mild to moderate aortic insufficiency.  5. Status post implantable cardioverted defibrillator.  6. Left bundle branch block and atrial pacing.   PLAN:  1. The patient is optimal regimen for heart failure.  His predominant      problems/symptoms are low output and exercise intolerance.      Interestingly, it is somewhat hard to substantiate for [place] by 6 minute      walk, and the patient did walk 1380.  I have not performed a CPX,      however, on this patient.  2. Based on the patient's EKG and his severe LV dysfunction, I do feel he      is a candidate for cardiac resynchronization therapy, and he will be      referred to Dr. Ladona Ridgel.  Dr. Ladona Ridgel  previously put in his      defibrillator, and he is planing to see him for ICD follow up September      26 in [place].  3. Told the patient that from a heart failure medication perspective, I      would not make any changes, and we should try to optimize his symptoms      through CRT pacing.                                   Learta Codding, MD,FACC   GED/MedQ  DD:  06/29/2006  DT:  06/30/2006  Job #:  [id number]

## 2011-03-04 NOTE — Consult Note (Signed)
NAME:  [name], [name] NO.:  [id number]   MEDICAL RECORD NO.:  [id number]                   PATIENT TYPE:  INP   LOCATION:  3741                                 FACILITY:  [facility]   PHYSICIAN:  Rozanna Boer., M.D.      DATE OF BIRTH:  03/01/1941   DATE OF CONSULTATION:  03/29/2004  DATE OF DISCHARGE:                                   CONSULTATION   REASON FOR ADMISSION:  Shan Levans, M.D. and Velora Heckler, M.D.   REASON FOR CONSULTATION:  Left renal calculus with recent pulmonary embolus.   BRIEF HISTORY:  This 70 year old white male was here for completion of a  thyroidectomy by Dr. Gerrit Friends on March 26, 2004.  He was discharged on June 11,  but several hours later developed chest tightness and shortness of breath  and pain.  He had an initial CT scan which was read as negative, but upon  further review, pulmonary emboli were identified in the right mid and left  mid lung zones. He was seen by Dr. Delford Field who anticoagulated him and I  talked to earlier.  The patient has bilateral renal calculi.  He has three  stones in each kidney, but the three on the right are all 2 mm size, and he  had a 7 mm stone in the left renal pelvis an then two other 3 mm stones in  the left kidney.  He was seen last week and scheduled for lithotripsy to be  done this week, but with the recent pulmonary emboli, that would not be  advised.  He also has bilateral simple renal cysts and two enhancing areas  in the gallbladder which could be polyps.   He came in to see me last week as a work-in for acute left flank pain on  March 24, 2004.  His daughter works at [employer] where  he went and had a urinalysis which showed blood, and he got a shot of  Toradol.  This relieved the pain, but the KUB and workup at that time showed  a 7 to 8 mm stone opposite L4 in the left, probably the ureteropelvic  junction.  He could not be treated with lithotripsy  because of the Toradol,  and you cannot get that within 48 hours of  lithotripsy, so he was scheduled  for this week.  He does have microscopic hematuria and is a smoker but then  had the left thyroid cancer, and the whole thyroid had to be removed last  week as above.   PAST MEDICAL HISTORY:  He has a T1C Gleason 3 + 3 adenocarcinoma of the  prostate with radical retropubic prostatectomy done May 2004, but he has no  evidence of disease, and his PSA were undetectable.   MEDICATIONS AS OUTPATIENT:  Included aspirin, Coreg, Mavik, niacin,  terazosin 2 mg, and Synthroid.   ALLERGIES:  No known drug allergies.   SOCIAL HISTORY:  He is still smoking a pack a day for 40+ years until last  admission.   REVIEW OF SYSTEMS:  He does have history of coronary artery disease with  stents but is doing well.   PHYSICAL EXAMINATION:  VITAL SIGNS:  Temperature 98.8, blood pressure  144/76, pulse 83, respirations 24.  GENERAL:  He is a pleasant white male with recent surgical scar on his neck  and in no acute distress.  HEENT:  Clear.  NECK:  Supple otherwise except for the recent scar and a little bit of  swelling.  LUNGS: Clear.  ABDOMEN:  Soft.  No CVA pain.  He has a low midline abdominal scar, well  healed.  GU:  Penis normal, uncircumcised. Bilaterally descended testes.  Epididymis  nontender.  RECTAL:  Vault was empty.  EXTREMITIES:  Negative for edema.  Good distal pulses.  Intact sensation to  light touch.   IMPRESSION:  1. Left ureteropelvic junction stone, 7 to 8 mm.  2. Bilateral renal calculi.  3. Prostate cancer status post radical retropubic prostatectomy May 2004     with __________.  4. History of thyroid cancer status post thyroidectomy.  5. Recent pulmonary emboli.  6. Anticoagulation secondary to above.   RECOMMENDATIONS:  He can be followed conservatively as long as he does not  have pain.  We can do without a stent, but if he needs a stent, this will be  done even  with him on anticoagulation if necessary just to relieve pain  until he can safely come off and have a lithotripsy, probably in four  months' time.  I will plan to see him monthly in the office just to keep  track.  His family knows to call me should he have problems.  He plans to go  home tomorrow, and I would agree that is certainly safe to do.                                               Rozanna Boer., M.D.    HMK/MEDQ  D:  03/29/2004  T:  03/29/2004  Job:  [id number]   cc:   Shan Levans, M.D. [facility]   Velora Heckler, M.D.  [address] [place]  [place] [address]  Fax: [phone]

## 2011-03-04 NOTE — Assessment & Plan Note (Signed)
[facility]                          [facility] OFFICE NOTE   NAME:[name], Andrews                      MRN:          [id number]  DATE:11/23/2006                            DOB:          Sep 30, 1941    Primary cardiologist:  Dr. Lewayne Bunting.  Primary electrophysiologist:  Dr. Lewayne Bunting.   REASON FOR OFFICE VISIT:  Scheduled 50-months followup.   Since last seen here in the clinic in September 2007, the patient has  undergone biventricular upgrade of his defibrillator by Dr. Lewayne Bunting, performed on October 15 of last year.  Unfortunately, the  procedure was complicated by development of a large left pneumothorax  requiring treatment with a chest tube.  This resulted in successful  resolution of the PTX, verified by followup chest x-rays.   The patient has since followed up with our [facility] in [place]  and is scheduled to see Dr. Ladona Ridgel next Thursday.   Clinically, the patient notes marked improvement in his exercise  tolerance and dyspnea.  He states that he can easily climb 2 flights of  stairs without significant associated shortness of breath.  He denies  any firing of his defibrillator.   The patient does note, however, some cramping of both arms and in the  back of the neck.  He has not had any blood work since his  hospitalization.   CURRENT MEDICATIONS:  1. Coated aspirin 325 daily.  2. Lipitor 10 daily.  3. Coreg 25 b.i.d.  4. Synthroid 0.15 daily.  5. Digoxin 0.25 daily.  6. Spironolactone 25 daily.  7. Lasix 20 daily.  8. Niacin 500 daily.  9. Trandolapril 3 mg daily.   PHYSICAL EXAMINATION:  Blood pressure 120/64, pulse 60, regular.  Weight  145.4.  GENERAL:  70 year old male, sitting upright in no distress.  HEENT:  Normocephalic, atraumatic.  NECK:  Palpable bilateral carotid pulses with left carotid bruit; no  JVD.  LUNGS:  Clear to auscultation all fields.  HEART:  Regular rate and rhythm (S1, S2), no  significant murmurs.  No  S3.  EXTREMITIES:  Palpable distal pulses with no edema.  NEUROLOGIC:  Flat affect, but no focal deficits.   IMPRESSION:  1. Severe ischemic cardiomyopathy.      a.     Ejection fraction 20-25% by 2D echocardiogram March 2007.      b.     Non-ST elevation myocardial infarction - stent right       coronary artery February 2003.      c.     Nonischemic adenosine perfusion study with large fixed       infero-inferoseptal defect; ejection fraction 26% March 2007.  2. History of inducible polymorphic ventricular tachycardia.      a.     Status post Medtronic defibrillator implantation August       2005.      b.     Status post recent biventricular upgrade with Medtronic       implantable cardioverter-defibrillator October 2007, by Dr. Lewayne Bunting.  3. Hyperlipidemia.  4. Upper extremity cramps.  5. Left carotid bruit.  6. History of pulmonary embolism.  7. Valvular heart disease.      a.     Moderate aortic regurgitation by 2D echocardiogram March       2007.  8. Pulmonary hypertension.   PLAN:  The patient is doing extremely well from a clinical standpoint  and has symptoms consistent with NYHA class I heart failure.  We will  assess his complaints of cramps and possible diffuse myalgias with a  BMET and a fasting lipid profile.  If the liver enzymes are elevated,  then we will cut back on the Lipitor to 5 mg daily.  We will also  schedule a carotid Doppler for assessment of left carotid bruit, to  exclude any significant intrinsic disease.  The patient will then return  for scheduled followup in 3 months.  Of note, the patient is scheduled  to follow up with Dr. Ladona Ridgel next week.      Gene Serpe, PA-C  Electronically Signed      Learta Codding, MD,FACC  Electronically Signed   GS/MedQ  DD: 11/23/2006  DT: 11/23/2006  Job #: [id number]

## 2011-03-04 NOTE — Op Note (Signed)
NAME:  [name], [name]                         ACCOUNT NO.:  [id number]   MEDICAL RECORD NO.:  [id number]                   PATIENT TYPE:  INP   LOCATION:  4711                                 FACILITY:  [facility]   PHYSICIAN:  Doylene Canning. Ladona Ridgel, M.D.               DATE OF BIRTH:  December 23, 1940   DATE OF PROCEDURE:  05/26/2004  DATE OF DISCHARGE:                                 OPERATIVE REPORT   PROCEDURE PERFORMED:  Invasive electrophysiologic study.   INDICATIONS:  Sustained symptomatic wide QRS tachycardia in a patient with  underlying left bundle branch block and ischemic cardiomyopathy.   I:  INTRODUCTION:  The patient is a 70 year old man who was admitted to the  hospital after experiencing palpitations and dizziness and ruled in for a  non-Q wave myocardial infarction.  He was found to have a wide QRS  tachycardia at a rate of 165 beats per minute which was treated with a host  of medications before finally terminating.  His tachycardic QRS morphology  is somewhat different than his baseline QRS suggestive of ventricular  tachycardia.  He underwent catheterization demonstrating patent stent site  and severe LV dysfunction with an ejection fraction of 20%.  He is now  referred for additional evaluation.   II:  PROCEDURE:  After informed consent was obtained, the patient was taken  to the diagnostic EP lab in a fasting state.  After the usual preparation  and draping, intravenous Fentanyl and Midazolam were given for sedation.  A  5 French quadripolar catheter was inserted percutaneously in the right  femoral vein and advanced to the right ventricle.  A 5 French quadripolar  catheter was inserted percutaneously in the right femoral vein and advanced  to the His bundle region.  A 5 French quadripolar catheter was inserted  percutaneously into the right femoral vein and advanced to the right atrium.  After measurement of the basic intervals, rapid ventricular pacing was  carried out  from the RV apex and step wise decreased down to 480  milliseconds where VA dissociation was observed.  During rapid ventricular  pacing, the atrial activation was midline and decremental.  Next, programmed  ventricular stimulation was carried out from the RV apex at a pacing cycle  length of 600 milliseconds.  S1 and S2, S1, S2, and S3, and S1, S2, S3, and  S4 stimuli were delivered with the S1-S2, S2-S3, S3-S4 interval step wise  decreased down to ventricular refractions.  During programmed ventricular  stimulation in an S1, S2, S3, S4 coupling interval of 600/310/220/260, there  was inducible polymorphic VT which degenerated into ventricular fibrillation  requiring defibrillation with 200 joules to restore sinus rhythm.  Following  this, rapid atrial pacing was carried out from the high right atrium at a  pacing cycle length of 600 milliseconds and step wise decreased down to 330  milliseconds where AV Wenckebach was observed.  During rapid  atrial pacing,  the PR interval was equal to the RR interval, but there were no inducible  arrhythmias.  Next, programmed atrial stimulation was carried out from the  high right atrium at a pacing cycle length of 500 milliseconds.  The S1 and  S2 interval was step wise decreased from 450 milliseconds down to 200  milliseconds where the AV node ARP was observed.  During programmed atrial  stimulation, there were no AH jumps and no obvious echo beats noted.  Next,  programmed ventricular stimulation was carried out from the RV outflow tract  at a pacing cycle length of 500 milliseconds.  The S1 and S2 interval was  step wise decreased from 440 milliseconds down to 320 milliseconds resulting  in the initiation of SVT.  Mapping was carried out.  The earliest atrial  activation was in the His bundle region.  The VA interval was essentially 0.  PVCs were placed at the time of His bundle refractory and did not pre-excite  the atrium and ventricular pacing  resulted in a VAV conduction sequence.  Finally ventricular pacing resulted in termination of the tachycardia.  Of  note, this patient's tachycardia was not at the same rate as the patient's  clinical tachycardia.  At this point, with inducible polymorphic VT as well  as inducible AV node re-entry tachycardia, the catheters were removed,  hemostasis was assured, and the patient was prepped for ICD insertion which  will be dictated on a separate report.   III:  COMPLICATIONS:  There were no immediate procedure complications.   IV:  RESULTS:  A.  Baseline ECG.  The baseline ECG demonstrates normal sinus rhythm with  left bundle branch block and a QRS duration of 220 milliseconds.  B.  Baseline intervals.  The HV interval was 76 milliseconds, AH interval 85  milliseconds, QRS duration 220 milliseconds.  C.  Rapid ventricular pacing.  Rapid ventricular pacing was carried out from  the RA apex as well as from the RV outflow tract at pacing cycle length of  600 milliseconds and step wise decreased down to 480 milliseconds where VA  dissociation was observed.  Additional decrements were carried out down to  270 milliseconds, but there was no inducible tachycardia.  It should be  noted that during rapid ventricular pacing, the atrial activation sequence  was midline and decremental.  D.  Programmed ventricular stimulation.  Programmed ventricular stimulation  was carried out from the RV apex as well as the RV outflow tract.  S1-S2, S1-  S2-S3, and S1-S2-S3-S4 stimuli were delivered with the S1-S2, S2-S3, and S3-  S4 interval step wise decreased down to ventricular refractions.  During  programmed ventricular stimulation at an S1-S2-S3-S4 coupling interval of  600/310/220/260, there was inducible polymorphic VT which rapidly  degenerated into ventricular fibrillation requiring a 200 joule biphasic  shock restoring sinus rhythm.  During programmed ventricular stimulation from the RV outflow tract  at an S1-S1 coupling interval of 500/320, there  was inducible AV node re-entry tachycardia at a cycle length of 437  milliseconds.  This tachycardia may well have been the patient's clinical  tachycardia, however, the rate was somewhat decreased from his clinical  arrhythmia.  E.  Rapid atrial pacing.  Rapid atrial pacing was carried out from the high  right atrium with pacing cycle length of 490 milliseconds and step wise  decreased down to 330 milliseconds where AV Wenckebach was observed.  During  rapid atrial pacing, the PR interval was equal to  but not greater than the  RR interval.  F.  Programmed atrial stimulation.  Programmed atrial stimulation was  carried out from the high right atrium at a paced base cycle length of 600  milliseconds.  The S1 and S2 interval was stepwise decreased from 400  milliseconds down to 200 milliseconds where the AV node ARP was observed.  During programmed atrial stimulation, there were no AH jumps, no echo beats,  and no inducible tachycardia.  G.  Arrhythmias observed:  1. Polymorphic ventricular tachycardia initiation programmed ventricular     stimulation, duration sustained, cycle length averaged 230 milliseconds,     method of termination was with 200 joule biphasic shock.  2. AV NRT initiation was with programmed ventricular stimulation, duration     sustained, cycle length 437 milliseconds, method of termination was     ventricular pacing at 360 milliseconds.     A. Mapping of the patient's inducible AV node re-entry tachycardia        demonstrated the earliest atrial activation in the His bundle region.   V:  CONCLUSION:  The study demonstrates demonstrates inducible polymorphic  ventricular tachycardia as well as inducible AV node re-entrant tachycardia  in a patient with an underlying ischemic cardiomyopathy and left bundle  branch block with an ejection fraction of 20%.  With the above findings, the  patient will be referred for a dual  chamber defibrillator implantation.  The  patient may well ultimately require catheter ablation of his AV NRT.                                               Doylene Canning. Ladona Ridgel, M.D.    GWT/MEDQ  D:  05/26/2004  T:  05/26/2004  Job:  [id number]   cc:   Learta Codding, M.D. [facility]   Jonelle Sidle, M.D. [facility]   Dhruv Vyas  [address]  [place]  [place] [address]  Fax: [phone]

## 2011-03-04 NOTE — Consult Note (Signed)
NAMELANNIS, LICHTENWALNER                         ACCOUNT NO.:  [id number]   MEDICAL RECORD NO.:  [id number]                   PATIENT TYPE:  INP   LOCATION:  3741                                 FACILITY:  [facility]   PHYSICIAN:  Shan Levans, M.D. [facility]            DATE OF BIRTH:  [birth year]   DATE OF CONSULTATION:  03/28/2004  DATE OF DISCHARGE:                                   CONSULTATION   REFERRING PHYSICIAN:  Dr. Velora Heckler.   CHIEF COMPLAINT:  Pulmonary embolism.   HISTORY OF PRESENT ILLNESS:  This 70 year old white male was here last week  for a completion thyroidectomy per Dr. Gerrit Friends on March 26, 2004.  He was  discharged on March 27, 2004 but several hours later, developed increasing  chest tightness and shortness of breath, came back later in the day on March 27, 2004 to [facility], then transferred and admitted to [facility] at 1:30 a.m. on March 28, 2004 with acute dyspnea and shortness of  breath and hypoxemia.  Initial CT of the chest was read as negative, but  upon further review, pulmonary emboli were identified in the right and left  mid-lung zones.  We are asked to see the patient for evaluation.  The  patient does have significant COPD but is not on inhaled medicines at home,  was still actively smoking until his last admission, a pack a day for 40+  years.  The patient has a previous history of coronary artery disease with  stents, history of prostate cancer with prostatectomy in the past.  The  patient is referred now for further evaluation.   PAST MEDICAL HISTORY:  Medical as noted above.   ALLERGIES:  None.   MEDICATIONS PRIOR TO ADMISSION:  Coreg, Mavik, terazosin and Synthroid.   SOCIAL HISTORY:  Still smokes, quit recently.   REVIEW OF SYSTEMS AND FAMILY HISTORY:  Noncontributory.   EXAM:  VITAL SIGNS:  Blood pressure 160/84, pulse 100, saturation 93% on 2  L.  GENERAL:  This is an elderly white male in no acute distress.  CHEST:  Chest  shows distant breath sounds without evidence of wheeze or  rhonchi.  CARDIAC:  Exam shows a regular rate and rhythm, S3, normal S1 and S2.  ABDOMEN:  Abdomen was soft and nontender.  Bowel sounds active.  EXTREMITIES:  Extremities showed no edema or clubbing or venous disease.  NEUROLOGIC:  Exam is intact.  HEENT:  Exam showed no jugular venous distention, no lymphadenopathy.  NECK:  Neck is supple.   LABORATORY DATA:  Chest x-ray and CT scan of the chest show emphysematous  change and bilateral pulmonary emboli in the right and left mid-lung zones.   Sodium is 137, potassium 4.0, chloride 102, CO2 29, BUN 18, creatinine is  1.3, blood sugar 121.  CK-MB 2.2, troponin I less than 0.05.   IMPRESSION:  Impression is that of bilateral pulmonary  emboli in a patient  with chronic obstructive lung disease, associated emphysema, recent  completion thyroidectomy.   RECOMMENDATIONS:  Continue IV heparin until we are for sure that a  lithotripsy is not planned.  If we are planning lithotripsy, continue  heparin surrounding this; if we are not doing a lithotripsy, then began  Coumadin and switch over to Lovenox subcu.  Will follow with you.                                               Shan Levans, M.D. [facility]    PW/MEDQ  D:  03/28/2004  T:  03/29/2004  Job:  [id number]   cc:   Velora Heckler, M.D.  [address] [place]  [place] [address]  Fax: [phone]

## 2011-03-04 NOTE — Discharge Summary (Signed)
NAME:  [name], [name]                         ACCOUNT NO.:  [id number]   MEDICAL RECORD NO.:  [id number]                   PATIENT TYPE:  INP   LOCATION:  4711                                 FACILITY:  [facility]   PHYSICIAN:  Doylene Canning. Ladona Ridgel, M.D.               DATE OF BIRTH:  07-05-1941   DATE OF ADMISSION:  05/21/2004  DATE OF DISCHARGE:  05/27/2004                           DISCHARGE SUMMARY - REFERRING   DISCHARGE DIAGNOSIS:  1.  Ischemic cardiomyopathy, ejection fraction approximately 20%.  2.  Non-ST segment elevated myocardial infarction at presentation,      presumably secondary to tachy arrhythmia.  3.  Wide complex tachycardia with inducible supraventricular tachycardia and      premature ventricular tachycardia.  4.  Status post ICD.  5.  History of previous pulmonary embolism in June 2005 now off Coumadin.  6.  Hematuria resolved.  7.  Coronary artery disease.   HOSPITAL COURSE:  Mr. Pereyra is a 70 year old  male patient who was admitted  on May 22, 2004, with non-ST segment elevated myocardial infarction.  He  initially presented to [facility] and was transferred to [facility] for evaluation.  Ultimately, he underwent cardiac catheterization  that revealed dilated LV with severe global hypokinesis with EF around 20%,  nonobstructive disease noted, previous stent in the right coronary artery  revealed no restenosis.  The patient was seen in consultation by the  electrophysiology team under the care of Dr. Lewayne Bunting.  The patient  underwent invasive EP study.  He was also noted to have wide complex  tachycardia with inducible SVT, PMVT, and an ICD DDD implant was performed.  The procedure was performed on May 26, 2004, and thresholds were checked  the following day with normal function.   The patient was also seen by the pulmonary service because of a history of  pulmonary embolism in June 2005.  Lower extremity Dopplers revealed no DVT.  CT of  the chest revealed no PE.  The patient was asked to stop his Coumadin.   The patient also had some hematuria and after [facility] was consulted, the  hematuria was flet to be secondary to anticoagulation and by discharge, his  urine was clear and he was scheduled to be seen by Dr. Aldean Ast in the  office after discharge.   Upon discharge, the patient was given the following instructions:   1.  Stop Coumadin.  2.  Start enteric coated aspirin 325 mg daily.  3.  Resume all other medications as prior to admission which included Mavik      2 mg daily, Coreg 12.5 mg b.i.d., Lipitor 10 mg q.h.s., Synthroid 100      mcg daily, Niacin 500 mg q.h.s., may use Tylenol as needed.   No heavy lifting of more than 10 pounds.  Activity sheet was given to the  patient specifically for pacemaker/ICD implants.  He is to have  an office  visit in the pacer clinic in two weeks, follow up with Dr. Ladona Ridgel in three  months.  The office will call for these appointments.   Lab studies during the patient's hospital stay included white count 4.7,  hemoglobin 12.8, hematocrit 36.4, platelets 212.  Sodium 140, potassium 4.1,  BUN 12, creatinine 0.9.  Maximum CK 213 with 13 MB fraction and troponin  4.14.  TSH 70.  He is asked to follow up with his primary care physician.       LB/MEDQ  D:  07/05/2004  T:  07/05/2004  Job:  [id number]   cc:   Kirby Funk, M.D.

## 2011-03-04 NOTE — Consult Note (Signed)
NAME:  [name], [name]                         ACCOUNT NO.:  [id number]   MEDICAL RECORD NO.:  [id number]                   PATIENT TYPE:  INP   LOCATION:  2923                                 FACILITY:  [facility]   PHYSICIAN:  Charlaine Dalton. Sherene Sires, M.D. [facility]           DATE OF BIRTH:  13-Dec-1940   DATE OF CONSULTATION:  05/21/2004  DATE OF DISCHARGE:                                   CONSULTATION   REFERRING PHYSICIAN:  Dr. Andee Lineman.   REASON FOR CONSULTATION:  Recent pulmonary embolism.   HISTORY:  This is a very complicated 70 year old white male who was last  admitted here on June 15 by Dr. Delford Field with acute dyspnea that developed  five days post thyroidectomy.  He was found to have pulmonary emboli by CT  scan, but had no hemodynamic instability and reports that he also had a  negative venous Doppler.  He has done well on Coumadin to the point where he  was out actually mowing grass according to the record (I talked to the  patient and this actually amounted to riding on a mower) and then after he  came in to get out of the heat he noticed a fluttering in his chest and  then several minutes of chest pressure.  He was taken to the emergency room  where he ruled in by enzymes, but had no definite EKG abnormalities other  than the arrhythmia (he has a left bundle branch block).  He is presently  comfortable, but has been transferred to [facility] for consideration of  left heart catheterization.  Presently he denies any dyspnea or pleuritic  pain or leg swelling, fever, chills, sweats.  Of note, he has had trouble  with hematuria ever since he was placed on Coumadin and has documented  nephrolithiasis with a plan for intervention in September by Dr. Vic Blackbird, at which time Dr. Delford Field felt he would be comfortable stopping  the Coumadin.   PAST MEDICAL HISTORY:  1. Ischemic heart disease, status post stent placement right coronary artery     in February 2003 with an ejection  fraction of 40% then.  2. Thyroid cancer, status post completion [facility] by Dr. Gerrit Friends in June of 2004.  3. Nephrolithiasis, complicated by hematuria, now followed by Dr. Aldean Ast.   ALLERGIES:  None known.   MEDICATIONS:  Include Coumadin, Mavik, Coreg, Lipitor, Synthroid, niacin and  an aspirin 81 mg every day.  For full inventory, please see the nursing  intake form, which I reviewed with the patient and is correct.   SOCIAL HISTORY:  He quit smoking at the time of his surgery in June of 2005.   FAMILY HISTORY:  Negative for respiratory diseases or clotting disorders.   REVIEW OF SYSTEMS:  __________ and significant for all the problems as noted  above.   PHYSICAL EXAMINATION:  GENERAL:  This is a  pleasant white male lying back in  bed stating he feels comfortable now with no dyspnea, nausea or chest  discomfort.  VITAL SIGNS:  He is afebrile.  HEENT:  Unremarkable.  Oropharynx is clear.  No post nasal drainage.  NECK:  Supple without cervical adenopathy, tenderness. Trachea midline.  LUNG FIELDS:  Minimal crackles in the bases, but no wheezes on expiration,  no increase in T2, no definite S3.  ABDOMEN:  Soft, benign with no palpable organomegaly or masses.  EXTREMITIES:  Warm without calf tenderness, cyanosis, clubbing or edema.    Heme O2 saturation is adequate on two liters nasal prongs.  INR is 2.4.  Hematocrit is 40.7% and chemistry profile is unremarkable.   IMPRESSION:  Non-Q-wave myocardial infarction in a patient actively on  Coumadin now for about six weeks following CT evidence for pulmonary  embolism with complete resolution of dyspnea, back to baseline, and no  evidence of hemodynamic instability, nor for that matter, any evidence of  pulmonary edema on chest x-ray despite that he ruled in for myocardial  infarction and he has decreased ejection fraction by both left heart  catheterization and repeat echocardiogram.   Presumably he had developed  ischemia as a result of his rapid rate, but, of course, this would not  explain the decrease in ejection fraction and I understand that cardiology  is anxious to consider a left heart catheterization if the patient will  tolerate it.   Presently he is at therapeutic INR with no evidence of deep vein thrombosis  clinically nor by previous venous Dopplers.  I believe that he is relatively  low risk therefore for recurrent pulmonary embolism and certainly heparin  can be started when the INR is less than 2.0, but could be stopped probably  after 48 hours without concern for recurrent deep vein thrombosis/pulmonary  embolism developing in this setting.  The more difficult issue is whether or  not he will be able to tolerate hematuria awaiting consideration for  urologic intervention.  For now I think that we can defer this issue since  this is really not why he is in the hospital, but now we have two problems  that may hinder anticoagulation.  I would leave the placement of a  Greenfield filter as a last resort in this setting in the event that he  either develops deep vein thrombosis or we have to consider a prolonged  period of time now off of anticoagulation (more than 48 to 72 hours for  instance).                                               Charlaine Dalton. Sherene Sires, M.D. [facility]    MBW/MEDQ  D:  05/21/2004  T:  05/22/2004  Job:  [id number]   cc:   Doreen Beam  [address]  [place]  [place] [address]  Fax: [phone]   Rozanna Boer., M.D.  [address]  [place]  [place] [address]  Fax: [phone]

## 2011-03-04 NOTE — Consult Note (Signed)
NAME:  [name], [name]                         ACCOUNT NO.:  [id number]   MEDICAL RECORD NO.:  [id number]                   PATIENT TYPE:  INP   LOCATION:  4711                                 FACILITY:  [facility]   PHYSICIAN:  Doylene Canning. Ladona Ridgel, M.D.               DATE OF BIRTH:  01/02/1941   DATE OF CONSULTATION:  05/25/2004  DATE OF DISCHARGE:                                   CONSULTATION   INDICATION FOR CONSULTATION:  Evaluation of wide QRS tachycardia in a  patient with ischemic cardiomyopathy.   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  He has a history of coronary artery disease and is  status post myocardial infarction.  He has a history of thyroid cancer and  is status post thyroidectomy.  He has a history of pulmonary embolism and  was on Coumadin in the past.  The patient was admitted to the hospital after  presenting to the [facility] with a wide QRS tachycardia and a left  bundle branch block configuration with rate of 170 beats per minute.  This  stopped with beta blocker therapy.  He is now referred for additional  evaluation.  As noted, the patient's troponin was quite elevated in the  setting of tachycardia as was his CPK.  The patient states that, since his  thyroid surgery, he has been markedly weak and fatigued with dyspnea on  exertion.  He has subsequently been found to be hypothyroid with a markedly  elevated TSH.   He underwent catheterization which demonstrated no progression of his  coronary disease.  He had a patent stent in the right coronary artery,  severe LV dysfunction, and ejection fraction of 20%.  In addition, he had  left bundle branch block.  He is now referred for additional evaluation.  The patient denies history of syncope.  He presently has not had chest pain  except for his episode of  wide QRS tachycardia.   PAST MEDICAL HISTORY:  As previously noted.   FAMILY HISTORY:   Noncontributory.   SOCIAL HISTORY:  The patient has a history of tobacco abuse, has stopped  smoking.  He denies alcohol abuse.   REVIEW OF SYSTEMS:  Negative for vision or hearing problems.  He does have  thyroid problem with severe fatigue as noted.  He has shortness of breath  with exertion and chest pain as well with his episode of tachycardia.  He  denies orthopnea or PND.  He denies peripheral edema.  He denies cough or  hemoptysis.  He denies skin changes.  He denies any arthritic complaints at  present, although he has had problems with arthritis in the past.  He denies  any neurologic changes.   PHYSICAL EXAMINATION:  GENERAL:  Pleasant 70 year old man in no distress.  VITAL SIGNS:  Blood pressure 130/80, pulse 68 and regular,  respirations 18.  HEENT:  Normocephalic and atraumatic.  Pupils equal and round.  Oropharynx  moist.  Sclerae intact.  NECK:  No jugular venous distention.  Well-healed surgical site.  No carotid  bruits present.  The trachea was midline.  LUNGS:  Clear to bilaterally to auscultation.  There were no wheezes, rales,  or rhonchi.  CARDIOVASCULAR:  Regular rate and rhythm with normal S1 and S2.  There were  no murmurs, rubs, or gallops appreciated.  ABDOMEN:  Soft, nontender, nondistended.  There was no organomegaly.  EXTREMITIES:  Demonstrated no cyanosis, clubbing, or edema.  Pulses were 2+  and symmetric.   LABORATORY AND X-RAY DATA:  EKG demonstrates sinus rhythm with left bundle  branch block.  EKG in tachycardia demonstrates a left bundle branch block  tachycardia.  The QRS morphology is slightly different than that of his  prior QRS in sinus rhythm.   IMPRESSION:  1. Ischemic cardiomyopathy.  2. Wide QRS tachycardia, unclear etiology.  3. History of renal stones.  4. History of congestive heart failure, presently Class II, previously Class     I.   DISCUSSION:  The patient's ischemic cardiomyopathy with fair LV dysfunction  and wide QRS  tachycardia is very concerning for ventricular tachycardia.  With baseline conduction disease, a bundle branch reentry VT would be  particularly likely.  I would recommend proceeding with invasive physiologic  study to determine the mechanism of his tachycardia followed by probable ICD  insertion in the setting of ischemic cardiomyopathy with ejection fraction  of 20% and Class II heart failure.  The risks, benefits, goals, and  complications of this procedure have been discussed with the patient, and he  would like to proceed.                                               Doylene Canning. Ladona Ridgel, M.D.    GWT/MEDQ  D:  05/26/2004  T:  05/26/2004  Job:  [id number]   cc:   Doreen Beam, M.D.  Corrin Parker, M.D. [facility]

## 2011-03-04 NOTE — Op Note (Signed)
NAME:  [name], [name]                   ACCOUNT NO.:  [id number]   MEDICAL RECORD NO.:  [id number]                   PATIENT TYPE:  INP   LOCATION:  X006                                 FACILITY:  [facility]   PHYSICIAN:  Rozanna Boer., M.D.      DATE OF BIRTH:  [birth year]   DATE OF PROCEDURE:  02/19/2003  DATE OF DISCHARGE:                                 OPERATIVE REPORT   PREOPERATIVE DIAGNOSIS:  T1C Gleason 3 + 3 adenocarcinoma of the prostate.   POSTOPERATIVE DIAGNOSES:  1. T1C Gleason 3 + 3 adenocarcinoma of the prostate.  2. Pending pathology report.   PROCEDURE:  Radical retropubic prostatectomy.   SURGEON:  Courtney [place], M.D.   ASSISTANT:  __________   BRIEF HISTORY:  This 70 year old patient is admitted with a clinical T1C  Gleason 3 + 3 right-sided adenocarcinoma of the prostate.  PSA was elevated  at 6.8.  Biopsy recently showed the cancer.  Previous biopsy in 2000 was  negative.  He enters now understanding the risks including but not limited  to bleeding, incontinence, impotence, deep vein thrombosis, pulmonary  emboli, and death.  He had a previous MI in 02/03 with a stent but has been  doing well since then.   DESCRIPTION OF PROCEDURE:  The patient was placed supine on the operating  table.  After satisfactory induction of general anesthesia, a rolled towel  was placed underneath his sacrum.  He was prepped and draped with Betadine  in usual sterile fashion.  A Foley catheter was inserted and the bladder  drained.  A midline incision was carried between the symphysis pubis and the  umbilicus and carried down to the midline fascia to expose the  retroperitoneal space.  The Bookwalter retractor was then placed, and the  nodes looked to be quite filmy and small bilaterally.  With the T1C 3 + 3  lesion, I thought that we could probably forego doing the nodes.  The  endopelvic fascia was then incised with the Bovie, and the puboprostatic  ligaments were cut sharply, and the mucosal vein complex was secured by  passing a Hohenfellner clamp underneath the dorsal vein complex twice, tying  this with a #1 Vicryl, and then a back-tie with 0 Vicryl.  A 0 Vicryl was  placed on the bladder neck with back-tie as well, and the dorsal vein  complex was then incised with the Bovie.  This exposed the urethra.  A right-  angle clamp was passed underneath this and elevated with an umbilical tape.  With sharp dissection, the urethra was cut leaving a nice long stump.  The  apex of the prostate was retracted back.  Incision was made posterior to the  prostate, and the plane between the prostate and the rectum was then sharply  and bluntly dissected.  The pedicle was then taken very close to the  prostate on both sides, and this was done back to the level of the seminal  vesicals  with silk suture.  The midline fascia over the prostate was then  incised, and the vas were delivered and clipped individually with a  hemoclip.  The seminal vesicals were then dissected free and a right-angle  clamp passed around them and taken in their mid portion.   The next dissection was on the bladder neck where the bladder neck was  dissected free from the urethra with sharp and blunt dissection.  The  urethra was then cut under direct vision and again, a nice long stump of  urethra was left.  The left side of the bladder was a little bit thin, but  prostate came away fairly easily, and the specimen was then removed.  The  bladder neck was then everted with interrupted 3-0 catgut suture, leaving a  nice anastomosis that would just admit my small finger.  The posterior part  of the bladder was then gently reinforced, taking care not to suture near  the orifices, but a figure-of-eight suture was done to reinforce where it  was a little bit thin.  Hemostasis was then achieved with cautery and with  sutures.  The wound was then irrigated, and a Greenwald sound was  passed  through the urethra, and the bladder and the urethral stitches were then  placed with 2-0 Vicryl outside-in at 7, 9, 12, 2, and 5 o'clock.  These were  then placed on the corresponding area of the bladder neck after passing a  #20 Foley catheter into the bladder and inflating the balloon to 15 mL.  These were tied down.  The anastomosis was watertight, and hemostasis was  quite good.  A J-P drain was placed through a separate stab incision on the  patient's right side into the retroperitoneal space on top of the  anastomosis and the wound closed with a running #1 PDS and the skin closed  with clips.  The irrigation of the bladder revealed a tiny clot but  otherwise was clear.  The patient was the taken to the recovery room in good  condition.  Estimated blood loss was 650 mL which he tolerated well.  No  transfusions were given.  Sponge, needle, and instrument counts were  correct.  He received 30 mg of Toradol IV in the OR.                                               Rozanna Boer., M.D.    HMK/MEDQ  D:  02/19/2003  T:  02/19/2003  Job:  [id number]

## 2011-03-04 NOTE — Op Note (Signed)
NAME:  [name], [name]                         ACCOUNT NO.:  [id number]   MEDICAL RECORD NO.:  [id number]                   PATIENT TYPE:  AMB   LOCATION:  NESC                                 FACILITY:  [facility]   PHYSICIAN:  Rozanna Boer., M.D.      DATE OF BIRTH:  [birth year]   DATE OF PROCEDURE:  03/31/2004  DATE OF DISCHARGE:                                 OPERATIVE REPORT   PREOPERATIVE DIAGNOSES:  1. Left ureteral stone.  2. Recent pulmonary embolus.  3. Anticoagulation.   POSTOPERATIVE DIAGNOSES:  1. Left ureteral stone.  2. Recent pulmonary embolus.  3. Anticoagulation.   OPERATION:  1. Cystoscopy.  2. Retrograde pyelogram.  3. Insertion of left ureteral stent.   ANESTHESIA:  General.   SURGEON:  Courtney [place], M.D.   BRIEF HISTORY:  This 70 year old patient had a thyroidectomy for cancer just  5 days ago but 1 day postop developed pulmonary emboli.  He had a left  proximal ureteral stone found 2 days before his thyroidectomy, but this was  not causing him any pain at that time and thought to be at the UP junction.  He was set up for lithotripsy but after the pulmonary embolus, of course,  this was cancelled.  Although he is anticoagulated, he has some pain and  some left lower abdominal pain today that is fairly acute and severe.  He  enters now for a ureteral stent to at least temporize him until he can come  off his anticoagulation to have definitive treatment of the stone.   DESCRIPTION OF PROCEDURE:  The patient was placed on the operating table in  dorsal lithotomy position.  After satisfactory induction of general  anesthesia, he was prepped and draped with Betadine and given Ancef and  gentamicin.  The KUB showed it looked like the stone had dropped now down to  the distal ureter, looked to be about 7-8 mm in size.  A retrograde was done  after passing the cystoscope into the bladder.  He had a mild obstructing  prostate.  His bladder was  2+ trabeculated; no other bladder mucosal lesions  seen.  The stone was in the ureter but when I did the retrograde, it seemed  to flush back up the dilated ureter to about the mid portion of the ureter.  I was able to get a Glidewire past the ureteropelvic junction where it was a  little bit tight and then passed the open-ended catheter over this.  Through  the open-ended catheter, I then passed a guidewire up into the renal pelvis  and removed the open-ended catheter over the guidewire.  I passed a 6 French  x 24 cm ureteral stent and had a nice  coil in the renal pelvis and one in the bladder when the guidewire was  removed.  The bladder was then drained and the scope removed.  The patient  was then taken to the recovery room and  will be admitted for postoperative  care afterwards due to his pulmonary embolus.                                               Rozanna Boer., M.D.    HMK/MEDQ  D:  03/31/2004  T:  03/31/2004  Job:  [id number]   cc:   Shan Levans, M.D. [facility]

## 2011-03-04 NOTE — Discharge Summary (Signed)
[facility]  Patient:    [name], [name] Visit Number: [id number] MRN: [id number]          Service Type: MED Location: [phone] Attending Physician:  Junious Silk Dictated by:   Guy Franco, P.A.-C. Admit Date:  12/10/2001 Discharge Date: 12/13/2001   CC:         Arturo Morton. Riley Kill, M.D. [facility]  Madolyn Frieze. Jens Som, M.D. [facility]  Judeth Porch, M.D., [address], [place], [place] [address]   Referring Physician Discharge [place]  REASON FOR ADMISSION:  [name] is a 70 year old male patient, who presented to [facility] complaining of three hours of chest pain.  He was noted to be in rapid atrial flutter-atrial fibrillation and converted to normal sinus rhythm.  Once he was in normal sinus rhythm, the chest pain resolved; however, his EKG continued to show a left bundle-branch block; therefore, he was sent to [facility] for further cardiac work-up.  Part of this work-up included cardiac isoenzymes, and his troponin was elevated at 0.05.  His renal function remained normal with BUN 20, creatinine 1.0, hemoglobin 16.3, hematocrit 47.6, and platelets 215.  HOSPITAL COURSE:  He eventually went to the cardiac catheterization lab, and he was found to have 90% mid right coronary artery lesion, status post PCI/stent on the same day.  He also had a proximal 50% LAD lesion followed by a 30% lesion just after D1.  There was a proximal 30% circumflex lesion followed by a mid 30% lesion just after OM-1.  The patient was also noted to have 2+ AI.  The patient remained stable during the rest of his hospitalization and was prepared for discharge to home on December 13, 2001.  FOLLOW-UP:  He will need to follow up with Dr. Diona Browner and [facility] for follow-up on his coronary artery disease, and she will need monitoring of his AI (including 2-D echocardiogram to be set up).  DISCHARGE MEDICATIONS: 1. Sublingual nitroglycerin p.r.n.  chest pain. 2. Atenolol 25 mg 1/2 tab q.d. 3. Enteric-coated aspirin 325 mg q.d. 4. Synthroid 125 mcg 1 p.o. q.d. 5. Plavix 75 mg 1 p.o. q.d. 6. Niacin 500 mg 1 tab p.o. q.h.s. cease to take aspirin prior 1 hour prior to    this dose.  (His total cholesterol during his stay was 112, triglycerides    108, HDL 29, and LDL 51).  DISCHARGE INSTRUCTIONS: 1. No strenuous activity. 2. Follow cardiac rehab recommendations. 3. Remain on a low fat diet. 4. Clean over cath site with soap and water. 5. He is instructed that any procedure that he has, i.e., dental procedures,    cleanings, or any surgeries, he will need antibiotic prophylaxis. 6. He will be called by the [facility] for follow-up with Dr. Diona Browner.  DISCHARGE DIAGNOSES: 1. ______ myocardial infarction, status post stent to the right coronary    artery, ejection fraction 40%. 2. Aortic insufficiency, 2+.  Apparently 2-D echocardiogram was done on    December 10, 2001; however, the report is unavailable at the time of    dictation. 3. Hypertension. 4. Chronic obstructive pulmonary disease. Dictated by:   Guy Franco, P.A.-C. Attending Physician:  Junious Silk DD:  12/13/01 TD:  12/13/01 Job: [id number]

## 2011-03-04 NOTE — Op Note (Signed)
NAME:  [name], [name]                         ACCOUNT NO.:  [id number]   MEDICAL RECORD NO.:  [id number]                   PATIENT TYPE:  AMB   LOCATION:  DAY                                  FACILITY:  [facility]   PHYSICIAN:  Rozanna Boer., M.D.      DATE OF BIRTH:  26-Jul-1941   DATE OF PROCEDURE:  06/18/2004  DATE OF DISCHARGE:                                 OPERATIVE REPORT   PREOPERATIVE DIAGNOSES:  Left ureteral stone 7-8 mm.   POSTOPERATIVE DIAGNOSES:  Left ureteral stone 7-8 mm.   OPERATION:  Cysto, removal of left ureteral stent, ureteroscopy with stone  removal, left ureteral stent reinserted.   ANESTHESIA:  General.   SURGEON:  Courtney Paris, M.D.   BRIEF HISTORY:  This 70 year old patient was admitted with a left mid to  lower ureteral stone for ureteroscopy.  He had a stent inserted July 2005  because of a large stone but later developed chest pain and was  anticoagulated for possible pulmonary emboli which has now been ruled out.  He has been off his Coumadin now for a week or more and enters with a 7-8 mm  stone at the junction of the left mid to lower ureteral junction for  ureteroscopy with possible holmium laser. Past history is significant in  that he had a radical retropubic prostatectomy May 2004 with no evidence of  disease.  He does have a little bit of hematuria and is a smoker.   The patient was placed on the operating table in the dorsal lithotomy  position and after satisfactory induction of general anesthesia was prepped  and draped with Betadine and given IV antibiotics.  The anterior urethra was  visualized with the scope, he had no anterior stricture seen, posterior  urethra was absent at the prostate from his previous prostatectomy.  The  bladder neck was wide open. The bladder was inspected and found to be free  of any mucosal lesions. The left ureteral orifice was visualized with a  stent coming from it. This was grasped and  pulled out just beyond the  urethral meatus. Through the open ended catheter under fluoroscopy passed a  guidewire up to the level of the kidney as the stent was then removed.  Over  the guidewire which was back loaded on the cystoscope, the guidewire was  then left in place. A #6 short ureteroscope was then passed up to the stone  and just beyond it, I was able to entrap the stone with a four wire flat  basket and remove the stone intact.  Over the guidewire which was back  loaded over a cystoscope, a new 6 French x 26 cm length double J ureteral  catheter was passed under fluoroscopy. The guidewire was then removed with  the stent with a nice coil in the kidney and also one in the bladder. The  string was left on the distal end which is brought out through  the penis  after draining the bladder and then taped to the penis.  I was taken to the  recovery room in good condition to be later discharged as an outpatient. We  will leave the stent in through the Labor Day weekend and have them come  back in four days to have this taken out. The stone will be sent for  analysis.                                               Rozanna Boer., M.D.    HMK/MEDQ  D:  06/18/2004  T:  06/19/2004  Job:  [id number]

## 2011-03-04 NOTE — Consult Note (Signed)
NAME:  [name], HENCE NO.:  [id number]   MEDICAL RECORD NO.:  [id number]                   PATIENT TYPE:  INP   LOCATION:  4711                                 FACILITY:  [facility]   PHYSICIAN:  Rozanna Boer., M.D.      DATE OF BIRTH:  March 22, 1941   DATE OF CONSULTATION:  05/24/2004  DATE OF DISCHARGE:                                   CONSULTATION   REASON FOR CONSULTATION:  Hematuria, bilateral renal calculi.   BRIEF HISTORY:  This 70 year old patient was admitted with some chest  discomfort and some shortness of breath and hematuria while on Coumadin.  He  has a complicated history, has known coronary artery disease.  He was  admitted by Dr. Sherril Croon in [facility] on May 21, 2004.  His  cardiac markers had trended abnormally and the patient was transferred here  for further evaluation.  He has history of pulmonary emboli.  He also had a  thyroidectomy in June with pulmonary embolus noted at that time.  He has a  left ureteral stent and has had some active hematuria while he is on the  Coumadin, he has had the stent about a month.  He was to have lithotripsy in  June, but because of the pulmonary embolus at the time of his thyroidectomy  this was postponed.  He has been bleeding pretty much for the last several  weeks, grossly, but not with clots.  Since he has been here, he went off his  Coumadin for his cardiac cath yesterday which apparently showed open  vessels.  He had no evidence of pulmonary emboli, but he does have a 7-8  millimeter stone in his left kidney and some bilateral stones.  He has had  recent prostate cancer and had an RRP May 2004 for a Gleason 3+3 T1C  adenocarcinoma of the prostate with no evidence of disease.  He is a smoker  and has had microscopic hematuria for some time and also the left thyroid  cancer.  He had a CT scan done Mar 06, 2004 which showed bilateral renal  calculi, the largest of which was a  7 millimeter stone in the lower pole of  the left kidney, and two 2 millimeter stones in the left mid kidney, and two  2 millimeters stones in the mid right kidney, and a third 2 millimeter stone  in the right lower kidney.  His medicines have included Coreg, Mavik,  niacin, terazosin, Synthroid, and Coumadin.   PAST MEDICAL HISTORY, SOCIAL HISTORY AND REVIEW OF SYSTEMS:  Are well-  documented on the chart.   PHYSICAL EXAMINATION:  His blood pressure is 129/88, his temperature is  98.6, pulse 72 and regular.  It seems like the cardiac arrythmia is what was  causing his initial symptoms, and if he is able to stay off his Coumadin we  can probably treat the stone much sooner.  A pleasant white male in  no acute  distress.  HEENT:  Clear.  NECK:  Supple.  Recent surgery is noted.  LUNGS:  Clear.  ABDOMEN:  Soft, liver and spleen not palpable.  No CVA pain.  PENIS:  Normal circumcised, adequate meatus, bilateral descended testes,  epididymis is nontender.  Prostate is deferred at this time.  EXTREMITIES:  Negative, no edema, good distal pulses.   IMPRESSION:  1. Bilateral renal calculi with left ureteral stent.  2. Hematuria.  3. History of pulmonary emboli.  4. History of thyroid cancer.  5. Cardiac arrythmias.   RECOMMENDATIONS:  I will see him in the office next week, if he is able to  stay off the Coumadin we will go ahead and do lithotripsy and remove the  stent at the earliest time possible.                                               Rozanna Boer., M.D.    HMK/MEDQ  D:  05/25/2004  T:  05/25/2004  Job:  [id number]

## 2011-03-04 NOTE — Discharge Summary (Signed)
NAMESERVANDO, KYLLONEN NO.:  [id number]   MEDICAL RECORD NO.:  [id number]          PATIENT TYPE:  INP   LOCATION:  2006                         FACILITY:  [facility]   PHYSICIAN:  Doylene Canning. Ladona Ridgel, MD    DATE OF BIRTH:  10-15-1941   DATE OF ADMISSION:  07/31/2006  DATE OF DISCHARGE:  08/03/2006                                 DISCHARGE SUMMARY   PROCEDURES:  1. Biventricular pacemaker upgrade.  2. Left thoracotomy.   PRIMARY DIAGNOSIS:  Ischemic cardiomyopathy.   SECONDARY DIAGNOSES:  1. Status post ventricular fibrillation arrest in the past with a      Medtronic implantable cardioverter-defibrillator and upgraded to a      biventricular device this admission.  2. Large left pneumothorax post procedure, status post chest tube and with      near complete resolution  3. Hypertension.  4. Congestive heart failure  5. Nephrolithiasis.  6. A history of pulmonary embolus.  7. A history of chronic obstructive pulmonary disease.  8. A history of thyroid cancer with surgery x2.  9. A history of prostate surgery.  10.A history of stent to his right coronary artery.   TIME AT DISCHARGE:  Thirty-seven minutes.   HOSPITAL COURSE:  [name] is a 70 year old male with a history of  ischemic cardiomyopathy as well as ventricular tachycardia and heart  failure.  He was evaluated by Dr. Ladona Ridgel in the office and it was felt that  he needed an upgrade of his ICD to a biventricular device.  He was admitted  for this on July 31, 2006.   The biV upgrade was successful.  It was very difficult to access because of  the subtotal occlusion of the innominate vein.  He did not have any  immediate complication but approximately 8 hours later,he had increasing  dyspnea and a chest x-ray showed a large left pneumothorax.  His saturations  were 95% on 50% O2 and his vital signs were stable.  Dr. Shelle Iron evaluated  the patient and felt that a chest tube was indicated which was placed  without difficulty.   The device was checked and had normal device function.  His chest tube was  left in until August 03, 2006.  It was removed at Pulmonary's  recommendations after his chest x-ray did not show a pneumothorax any  longer.   The chest x-ray performed after the chest tube removal showed no  pneumothorax.  There was a mild increase in left lower lobe atelectasis and  a stable tiny right pleural effusion and mild right lower lobe atelectasis.  Because his pneumothorax did not return after the chest tube was  discontinued, Mr. Whitwell was considered stable for discharge with outpatient  followup arranged.   DISCHARGE INSTRUCTIONS:  1. His activity level is to be restricted per the activity sheet.  2. He is to call our office for any problems with the catheter site.  3. He is to follow up with the [facility] on Wednesday, October 24, at      10:40 a.m. and to see Dr. Ladona Ridgel on February 13  at 9 a.m..  He is to      follow up with Dr. Doreen Beam and Dr. Andee Lineman as needed or as scheduled.   DISCHARGE MEDICATIONS:  1. Keflex 500 mg q.i.d. x5 days.  2. Coreg 25 mg b.i.d.  3. Lipitor 10 mg nightly.  4. Aldactone 25 mg daily.  5. Mavik 2 mg daily.  6. Synthroid 150 mcg daily.  7. Furosemide 20 mg daily.  8. Digitek 0.25 mg daily.  9. Aspirin 325 mg daily.  10.Niacin 500 mg daily.     ______________________________  Theodore Demark, PA-C    ______________________________  Doylene Canning. Ladona Ridgel, MD    RB/MEDQ  D:  08/03/2006  T:  08/06/2006  Job:  [id number]   cc:   Tina Griffiths [place], [place]

## 2011-03-04 NOTE — Assessment & Plan Note (Signed)
[facility]                           ELECTROPHYSIOLOGY OFFICE NOTE   NAME:[name], [name]                      MRN:          [id number]  DATE:07/12/2006                            DOB:          Jul 11, 1941    REASON FOR REFERRAL:  [name] is referred today by Dr. Learta Andrews for  consideration for upgrade to a BICD.   HISTORY OF PRESENT ILLNESS:  The patient is a very pleasant 70 year old male  with a history of an ischemic cardiomyopathy and congestive heart failure.  He has a history of VF arrest in the past, for which he was successfully  resuscitated.  The patient has been followed by Dr. Andee Andrews and was fairly  stable until the last 1-2 months -- when he has noted, despite up-titrate of  his medications with Coreg 25 mg twice a and Mavic and digoxin, to have  increasing heart failure symptoms.  He is referred now for additional  evaluation.  He has not had any recurrent syncope.  He denies intracurrent  IC therapies.   PAST MEDICAL HISTORY:  1. Renal stones, for which he follows with Dr. Aldean Andrews.  2. History of pulmonary embolism.  3. History of chronic obstructive pulmonary disease.   FAMILY HISTORY:  Noncontributory.   SOCIAL HISTORY:  The patient is married and denies tobacco or ethanol abuse.   ADDITIONAL PAST MEDICAL HISTORY:  Notable for hypertension.   REVIEW OF SYSTEMS:  Negative, except as noted in the HPI.   PHYSICAL EXAMINATION:  GENERAL:  A pleasant middle aged man in no acute  distress.  VITAL SIGNS:  Blood pressure (today) 138/66, pulse 69 and regular,  respirations 18, weight 148 pounds.  HEENT:  Normocephalic and atraumatic.  Pupils equal and round.  Oropharynx  moist.  Sclerae are anicteric.  NECK:  Revealed a 7 cm jugular venous distention.  There is no thyromegaly.  Trachea was midline. The carotids are 2+ and symmetric.  LUNGS:  Clear bilaterally to auscultation. There were no wheezes, rales or  rhonchi.   There is no increased work of breathing.  CARDIOVASCULAR:  Revealed a regular rate and rhythm with normal S1 and S2.  There is a soft S4 gallop present.  The PMI was enlarged and laterally  displaced.  His prior ICD incision was healed nicely.  ABDOMEN:  Soft, nontender and nondistended.  There was no organomegaly.  Bowel sounds are present.  There was no rebound or guarding.  EXTREMITIES:  Demonstrated no clubbing, cyanosis or edema.  The pulses were  2+ and symmetric.  NEUROLOGIC:  Alert and oriented x3 with cranial nerves intact.  The strength  was 5/5 and symmetric.   EKG:  Demonstrates sinus rhythm with left bundle branch block and a QRS  duration of 170 msec.   DEFIBRILLATOR TESTING:  Interrogation of his defibrillator demonstrates a  Medtronic Entrust, which was implanted for sustained VT in August 2005.  His  P and R-wave are 4 and 8 respectively.  The impedance was 552 in the atrium  and 520 in the right ventricle.  The threshold was 0.05 and  0.4 in the right  atrium, and was 2 at 0.4 in the right ventricle and one at 0.8 in the right  ventricle.  The battery voltage was 3.13 V.  There were no intracurrent IC  therapies.   IMPRESSION:  1. Ischemic cardiomyopathy.  2. Ventricular tachycardia.  3. Congestive heart failure, now class 3 -- despite maximum medical      therapy with Coreg.   DISCUSSION:  I have reviewed the patient's symptoms and medications, and  also noted on cardiac compass that his patient activity has markedly  increased in the last 2 months.  This is despite maximum medical therapy for  his congestive heart failure.  I have recommended that we plan to proceed  with upgrade to a biventricular ICD.  The risks, benefits, goals and  expectations of the procedure have been discussed with the patient, and he  has considering his options and will call us if he would like to proceed  with this.            ______________________________  [name] Canning. Ladona Ridgel,  MD     GWT/MedQ  DD:  07/12/2006  DT:  07/14/2006  Job #:  [id number]   cc:   [name] Codding, MD,FACC  [name]

## 2011-03-04 NOTE — Op Note (Signed)
NAME:  [name], [name]                         ACCOUNT NO.:  [id number]   MEDICAL RECORD NO.:  [id number]                   PATIENT TYPE:  AMB   LOCATION:  DAY                                  FACILITY:  [facility]   PHYSICIAN:  Currie Paris, M.D.           DATE OF BIRTH:  December 15, 1940   DATE OF PROCEDURE:  09/01/2003  DATE OF DISCHARGE:                                 OPERATIVE REPORT   PREOPERATIVE DIAGNOSIS:  Right inguinal hernia.   POSTOPERATIVE DIAGNOSIS:  Right inguinal hernia, indirect.   OPERATION/PROCEDURE:  Repair of indirect right inguinal hernia.   ANESTHESIA:  General.   SURGEON:  Currie Paris, M.D.   CLINICAL HISTORY:  [name] has presented with a small right inguinal  hernia which I saw him for this summer and has persisted.  He wished to have  this repaired.   DESCRIPTION OF PROCEDURE:  The patient was seen in the holding area. Had no  further questions.  His exam was unremarkable except for the presence of the  hernia.   The inguinal area was marked as the operative site and the patient taken to  the operating room.  After satisfactory general endotracheal anesthesia had  been obtained, the area was shaved, prepped and draped.  A combination of 1%  Xylocaine with epinephrine and 0.5% Marcaine was mixed equally and used to  infiltrate the area to help with postoperative analgesia.  An inguinal  incision was made deep into the external oblique aponeurosis with bleeders  coagulated and/or tied with 3-0 Vicryl.  The external oblique was cleaned  off and opened in the line of its fibers. Cord structures were elevated off  the floor and surrounded with a Penrose drain.  A fairly thick but  relatively small indirect sac was found and opened.  With it opened, I was  able to strip the contents off.  We got down to a fairly narrow neck.  It  was suture ligated with a 2-0 silk followed by 2-0 silk tie and the excess  amputated.  The stump retracted nicely  under the deep ring.  The deep ring  was fairly intact as was the floor so I just put a piece of Marlex mesh cut  to shape and sutured in with a 2-0 Prolene inferiorly.  This was a running  suture.  It was laid on top of the internal oblique medially and the tail  cut laterally to go around the cord and the tails crossed and tucked  together.  This produced a nice on-lay over the area.   Everything appeared to be dry and the area was closed with 3-0 Vicryl  followed by on the external oblique and Scarpa's and 4-0 Monocryl  subcuticular.  Additional local had been infiltrated as we worked.   The patient tolerated the procedure well.  There were no operative  complications.  All counts were correct.  Currie Paris, M.D.    CJS/MEDQ  D:  09/01/2003  T:  09/01/2003  Job:  [id number]   cc:   Rozanna Boer., M.D.  [address]  [place]  [place] [address]  Fax: [phone]   Jonelle Sidle, M.D. [facility]

## 2011-03-04 NOTE — Cardiovascular Report (Signed)
. [facility]  Patient:    [name], [name] Visit Number: [id number] MRN: [id number]          Service Type: MED Location: [id number] Attending Physician:  Junious Silk Dictated by:   Arturo Morton Riley Kill, M.D. [facility] Proc. Date: 12/11/01 Admit Date:  12/10/2001   CC:         Cardiac Catheterization Laboratory  [facility]  Nathen May, M.D. [facility]   Cardiac Catheterization  INDICATIONS: The patient is a 70 year old, who presents with a non-ST elevation myocardial infarction. He was transferred. He has a history of rheumatic valvular heart disease and was admitted with chest pain. Echocardiogram reveals mild aortic regurgitation. He also has left bundle-branch block on his ECG. Enzymes were positive.  PROCEDURES: 1. Left heart catheterization. 2. Selective coronary arteriography. 3. Selective left ventriculography. 4. Root aortography. 5. Percutaneous coronary intervention and stenting of the right coronary    artery.  DESCRIPTION OF PROCEDURE: The patient was brought to the catheterization lab and prepped and draped in the usual fashion. Through an anterior puncture, the right femoral artery was easily entered. Views of the left and right coronary arteries were obtained in multiple angiographic projections. We then used the guiding catheter because the left coronary was large and difficult to fill. Aortic root aortography as well as left ventriculography was performed. The patient had evidence of a ruptured plaque at the mid right coronary artery. I explained the situation with both the patient and his family and we elected to proceed on with percutaneous stenting. Risks were discussed. Heparin and Integrilin were given according to protocol. A JR4, 7 [place] guiding catheter with side holes was utilized to intubate the vessel. The vessel was tortuous but was crossed with a traverse wire down into the distal  vessel. We were able to affectively dilate then stent the distal lesion. The lesion was stented using a 2.75 x 12 Express II stent. There was some mild segmental plaquing prior to the lesion and we elected to leave this alone because it was somewhat segmental and Dr. Gerri Spore and I looked at it in detail. The procedure was completed without difficulty. He did have trouble with passing water, and we attempted to place a Foley but it had a lot of resistance and then was able to pass this water on his own. He was taken to the holding area in satisfactory clinical condition after removal of the catheters.  HEMODYNAMICS: The central aortic pressure was 152/78 with a mean of 109. LV pressure was 151/14 with an LVEDP of 30. There was no gradient on pullback across the aortic valve.  ANGIOGRAPHIC DATA: The left main coronary artery is free of critical disease.  The LAD courses to the apex and has some segmental lesions. There is a 40% segmental lesion in the proximal vessel. There is a 30% lesion more distally. The lesion more proximally could be up to 50% in the worst view.  The circumflex is a large lesion and has tandem lesion of about 30% each. There is one large marginal branch.  The right coronary artery is a large tortuous vessel. In the distal vessel, there is a 30% area of segmental narrowing followed by a 90% focal ruptured plaque. Following stenting, this was reduced to 0%. One of the posterolateral branches has mild luminal irregularity.  Aortography reveals mild dilatation of the aortic root with ______  aortic regurgitation.  The estimated ejection fraction on ventriculography is about 40% with  mild global hypokinesis.  IMPRESSION: Non-ST elevation myocardial infarction treated with primary stenting, 2+ aortic regurgitation.  PLAN: 1. Long-term followup in [place] and microbial prophylaxis. 2. Plavix and aspirin. Dictated by:   Arturo Morton Riley Kill, M.D. [facility] Attending  Physician:  Junious Silk DD:  12/11/01 TD:  12/11/01 Job: [id number]

## 2011-03-04 NOTE — Cardiovascular Report (Signed)
NAME:  [name], Andrews                         ACCOUNT NO.:  [id number]   MEDICAL RECORD NO.:  [id number]                   PATIENT TYPE:  INP   LOCATION:  4711                                 FACILITY:  [facility]   PHYSICIAN:  Carole Binning, M.D. [facility]         DATE OF BIRTH:  [birth year]   DATE OF PROCEDURE:  05/24/2004  DATE OF DISCHARGE:                              CARDIAC CATHETERIZATION   PROCEDURE:  Left heart catheterization with coronary angiography, left  ventriculography and aortic root angiography.   INDICATIONS FOR PROCEDURE:  The patient is a 70 year old man with a history  of coronary artery disease and previous stent to the distal right coronary  artery.  He also has a history of aortic insufficiency.  He presented to  [facility] with a wide complex tachycardia which was interpreted as  being atrial flutter with an underlying left bundle branch block.  He  converted to sinus rhythm, but subsequently ruled in for a non-Q wave  myocardial infarction.  He has been on chronic Coumadin due to a recent  pulmonary embolism.  His Coumadin was held and once his INR was down to a  safe level, we proceeded with cardiac catheterization.   DESCRIPTION OF PROCEDURE:  A 6 French sheath was placed in the right femoral  artery.  Coronary angiography was performed with a 6 Jamaica JL5 and JR4  catheters.  Left ventriculography and aortic root angiography were performed  with an angled pigtail catheter.  Contrast was Omnipaque.  At the conclusion  of the procedure, an Angioseal vascular closure device was placed in the  right femoral artery with good hemostasis.  There were no complications.   RESULTS:  1. Hemodynamics.  Left ventricular pressure 134/20, aortic pressure 134/72.     There is no aortic valve gradient.  2. Left ventriculogram; the left ventricle is very dilated with severe     global hypokinesis. The ejection fraction is estimated at 20 to 25%.     There is trace  mitral regurgitation.  3. Aortic root angiography reveals moderate dilatation of the ascending     aorta with 2 to 3+ moderate aortic insufficiency.   CORONARY ARTERIOGRAPHY:  (Right dominant)  1. Left main has minor luminal irregularities.  2. Left anterior descending artery has a 30% stenosis in the proximal vessel     and a diffuse 25% stenosis in the midvessel.  The LAD gives rise to a     normal size first diagonal branch and a small second diagonal branch.     The first diagonal branch has a 30% stenosis at its origin.  3. Left circumflex gives rise to a very large branching first obtuse     marginal and a small second obtuse marginal branch.  There is a 30%     stenosis in the proximal circumflex.  The first obtuse marginal branch     has a diffuse 20% stenosis.  The  second obtuse marginal branch which is     very small in size has a 70% stenosis proximally.  4. Right coronary artery has a 50% stenosis in the proximal vessel, 25% in     the mid vessel, and 20% stenosis in the distal vessel.  There is a stent     in the distal vessel which is proximal to the posterior descending artery     which is widely patent with 0% stenosis within the stents here.  The     distal right coronary artery gives rise to a normal size posterior     descending artery which has a 25% stenosis in the midportion.  There are     three small posterolateral branches.   IMPRESSION:  1. Dilated left ventricle with severe global hypokinesis and severely     decreased left ventricular systolic function.  2. Moderate aortic insufficiency.  3. Nonobstructive coronary artery disease.   PLAN:  Recommendations for medical therapy for his coronary artery disease.  Electrophysiology will be consulted regarding his atrial flutter and  cardiomyopathy.                                               Carole Binning, M.D. [facility]    MWP/MEDQ  D:  05/24/2004  T:  05/25/2004  Job:  [id number]   cc:   Doreen Beam  [address]  [place]  [place] [address]  Fax: [phone]   [facility] in [place]

## 2011-03-04 NOTE — Consult Note (Signed)
NAME:  [name], [name]                         ACCOUNT NO.:  [id number]   MEDICAL RECORD NO.:  [id number]                   PATIENT TYPE:  AMB   LOCATION:  NESC                                 FACILITY:  [facility]   PHYSICIAN:  Rozanna Boer., M.D.      DATE OF BIRTH:  08-21-1941   DATE OF CONSULTATION:  DATE OF DISCHARGE:                                   CONSULTATION   REQUESTED BY:  Shan Levans, M.D. [facility]   REASON FOR CONSULTATION:  Patient with pulmonary embolus and left ureteral  stone with obstruction. This 70 year old patient I saw last week as a work-  in because of acute left flank pain. He is well known to as he had a  previous T1-C Gleason 3+3 adenocarcinoma of prostate and radical retropubic  prostatectomy in May 2004 and he has had no evidence of disease since then.  He is a smoker and has microscopic hematuria, but was found to have left  thyroid cancer and five days ago underwent a thyroidectomy. He went home the  day after surgery, but came back with acute pulmonary embolus.  He was to  have lithotripsy, but he had gotten some Toradol within 24 hours of the  lithotripsy and the stone which was originally about 7-8 mm seemed to be at  the UP junction. It was felt that with his anticoagulation for the pulmonary  embolus we would just watch him and stent if necessary if the pain  increased. This happened today at home. He had acute left flank pain that  was severe and unrelenting. Being on anticoagulation, he comes in now to be  evaluated for possible stent placement. On his previous studies, he had two  2-mm stones in his left mid kidney, two 2-mm stones in his right mid kidney,  and a third 2-mm stone in the lower right kidney. His medications include  Coreg, Mavik, niacin, Terazosin, Synthroid, and Coumadin. He had not had  previous stones. He did have some gross hematuria today.   His past medical history, social history, and review of systems are as just  recorded recently last week.   PHYSICAL EXAMINATION:  VITAL SIGNS: Stable, but he is a pleasant, middle-  aged white male complaining of pain in his left flank.  SKIN: He has a recent scar on his neck from previous thyroid surgery.  HEENT: Oropharynx is clear.  NECK: Supple.  CHEST: Clear.  HEART: Regular heart sounds.  ABDOMEN: Soft, but he does have some left CVA tenderness. The abdomen is  without masses. Spleen and liver not palpable.  GU: Bladder not distended. Penis not circumcised. Bilaterally descended,  normal size testes. The prostate is absent. The rectal fossa is empty.  EXTREMITIES: Negative. No edema. A little slight rash on his left inner  thigh is noted. Good distal pulses. Intact sensation to light touch.   IMPRESSION:  1. Left ureteral stone with colic.  2. Recent pulmonary embolus.  3.  Recent thyroidectomy for thyroid cancer.  4. Previous T1-C Gleason 3+3 adenocarcinoma of the prostate, radical     retropubic prostatectomy, May 2004, with no evidence of disease.   RECOMMENDATIONS:  Will go ahead and stent today and then follow up as an  outpatient.  He will probably have to have the stent changed before the four  months of anticoagulation is finished.                                               Rozanna Boer., M.D.    HMK/MEDQ  D:  03/31/2004  T:  03/31/2004  Job:  [id number]

## 2011-03-04 NOTE — Op Note (Signed)
NAME:  [name], [name]                         ACCOUNT NO.:  [id number]   MEDICAL RECORD NO.:  [id number]                   PATIENT TYPE:  INP   LOCATION:  4711                                 FACILITY:  [facility]   PHYSICIAN:  Doylene Canning. Ladona Ridgel, M.D.               DATE OF BIRTH:  January 25, 1941   DATE OF PROCEDURE:  05/26/2004  DATE OF DISCHARGE:                                 OPERATIVE REPORT   PROCEDURE PERFORMED:  Insertion of a dual chamber defibrillator.   INDICATIONS FOR PROCEDURE:  Ischemic cardiomyopathy with an ejection  fraction of 20%, class 2 heart failure, wide QRS tachycardia with inducible  polymorphic ventricular tachycardia at electrophysiologic study.   INTRODUCTION:  The patient is a 70 year old man with a history of myocardial  infarction, who was admitted to the hospital after an episode of sustained  wide QRS tachycardia, unclear etiology.  He underwent catheterization  demonstrating an ejection fraction of 20% and patent stent site.  He  subsequently underwent an invasive electrophysiologic study which  demonstrated inducible polymorphic ventricular tachycardia with programmed  stimulation as well as inducible AV node re-entrant tachycardia.  Interestingly, both tachycardias had a cycle length different from the  patient's clinical arrhythmia.  He is now referred for implantable  cardioverter-defibrillator insertion secondary to the ischemic  cardiomyopathy, ejection fraction of 20% and inducible tachycardia.   DESCRIPTION OF PROCEDURE:  After informed consent was obtained, the patient  was taken to the diagnostic electrophysiology laboratory in a fasting state.  After the usual preparation and draping, intravenous fentanyl and Midazolam  were given for sedation.  A total of 30 mL of lidocaine was infiltrated into  the left infraclavicular region. A 9 cm incision was carried out over this  region and electrocautery utilized to dissect down to the fascial plane.  10  mL of contrast injected into the left upper extremity venous system  demonstrating a patent left subclavian vein.  It was subsequently punctured  times two and the Medtronic model 6949 65 cm active fixation defibrillation  lead, serial number [id number] was advanced into the right ventricle and the  Medtronic model 5076 52 cm active fixation pacing lead serial number  [id number] was advanced into the right atrium.  Mapping was carried out in  the right ventricle and the final site of the R-waves measured 12 mV and  pacing threshold was 0.6 V at 0.5 msec.  Once the lead was actively fixed,  the pacing impedance was 765 ohms.  10 V pacing did not stimulate the  diaphragm.  With the defibrillation in satisfactory position, attention was  then turned to placement of the atrial lead.  It was placed in the right  atrial appendage where P-waves measured 2.5 to 3 mV and the atrial threshold  was 1.9 V at 0.5 msec with a pacing impedance of 724 ohms.  Again 10 V  pacing did not stimulate the diaphragm.  With the atrial and defibrillation  leads in satisfactory position, they were secured to the pectoralis fascia  with a figure-of-eight silk suture. In addition the sewing sleeve was  secured with silk suture.  Electrocautery was then utilized to make a  subcutaneous pocket.  Kanamycin irrigation was utilized to irrigate the  pocket.  The Medronic Intrust-D 154 ATZ dual chamber defibrillator, serial  number [id number] was connected to the atrial and defibrillation lead and  placed in the subcutaneous pocket.  The generator was secured with silk  suture.  Defibrillation threshold testing was carried out.   After the patient was more deeply sedated with fentanyl and Versed,  ventricular fibrillation was induced with a T-wave shock.  An initial 15  joule shock was delivered which terminated ventricular fibrillation and  restored sinus rhythm.  Five minutes was allowed to elapse and a second   defibrillation threshold test was carried out.  Again ventricular  fibrillation was induced with a T-wave shock.  This time a 10-joule shock  was delivered which failed to terminate ventricular fibrillation.  The  device recharged to 20 joules which was delivered and successfully  terminated ventricular fibrillation.  At this point no additional  defibrillation threshold testing was carried out and the incision was closed  with a layer of 2-0 Vicryl, followed by a layer of 3-0 Vicryl followed by a  layer of 4-0 Vicryl.  Benzoin was painted on the skin and Steri-Strips were  applied.  Pressure dressing placed and the patient returned to his room in  satisfactory condition.   COMPLICATIONS:  There were no immediate procedure complications.   RESULTS:  This demonstrates successful implantation of a Medtronic dual  chamber defibrillator in a patient with an ischemic cardiomyopathy, an  ejection fraction of 20%, class 2 heart failure, with wide QRS tachycardia  and inducible polymorphic ventricular tachycardia at electrophysiologic  study and inducible AV re-entrant tachycardia at electrophysiologic study.  Of note, the patient's device will allow for pacing therapies to terminate  the patient's SVT as well as his ventricular tachycardia.                                               Doylene Canning. Ladona Ridgel, M.D.    GWT/MEDQ  D:  05/26/2004  T:  05/27/2004  Job:  [id number]   cc:   Doreen Beam  [address]  [place]  [place] [address]  Fax: [phone]   Jonelle Sidle, M.D. [facility]   Learta Codding, M.D. [facility]

## 2011-03-04 NOTE — Op Note (Signed)
NAMEDRAYCEN, LEICHTER NO.:  [id number]   MEDICAL RECORD NO.:  [id number]          PATIENT TYPE:  INP   LOCATION:  2899                         FACILITY:  [facility]   PHYSICIAN:  Doylene Canning. Ladona Ridgel, MD    DATE OF BIRTH:  [birth year]   DATE OF PROCEDURE:  07/31/2006  DATE OF DISCHARGE:                                 OPERATIVE REPORT   PROCEDURE PERFORMED:  Removal of a previously implanted ICD, insertion of a  new left ventricular pacing lead, with contrast venography of the coronary  venous system, insertion of a new biventricular ICD with device  (defibrillation) testing.   INDICATIONS:  Ischemic cardiomyopathy, VT, class III heart failure, left  bundle branch block, status post previous ICD implantation   I. INTRODUCTION:  The patient is a 70 year old male with an ischemic  cardiomyopathy, congestive heart failure, and left bundle branch block, who  is status post ICD implantation in the past secondary to VT.  The patient is  now referred for upgrade to biventricular ICD, as he has developed worsening  congestive heart failure.   II. PROCEDURE:  After informed consent was obtained, the patient was taken  diagnostic EP lab in fasting state.  After the usual preparation and  draping, intravenous fentanyl and midazolam were given for sedation.  30 cc  of lidocaine was infiltrated to the left infraclavicular region.  A 7 cm  incision was carried out over this region.  Electrocautery was utilized to  dissect down to the fascial plane.  10 cc of contrast was injected into the  left upper extremity venous system, demonstrating a subtotally occluded  innominate vein.  The left subclavian vein was punctured.  Of note, the  puncture of the subclavian vein was made more difficult by that the  patient's prior subtotal occlusion of the innominate vein.  However, this  was accomplished, and a slick wire was utilized and traversed the subtotal  occlusion of the vein, and allowing  the slick wire to be advanced into the  inferior vena cava.  Using a combination of various small dilators,  including 6 Jamaica, 7 Jamaica, and 9French dilators, a passageway was made  for the guiding catheter to be advanced over the guide wire and into the  right atrium.  A 6 French hectopolar EP catheter was then inserted through  the guiding catheter and advanced into the coronary sinus without particular  difficulty.  Venography of the coronary sinus was carried out.  This  demonstrated a nice lateral vein at approximately 2-3 o'clock on the LAO  projection.  This vein was utilized for LV lead placement.  Because of very  acute takeoff of the lateral vein, a subselective Attain catheter was  utilized, and the [facility] 014 angioplasty guide wire was advanced through  the subselective catheter into the lateral vein without difficulty.  The  subselective catheter was withdrawn, and the Medtronic bipolar pacing lead  (model 4194, 88 cm passive lead, serial number [id number]), and this lead was  advanced into the lateral vein approximately 2/3 of the distance from base  to apex in  the lateral vein.  At this point, the pacing threshold of less  than volt at 0.5 msec, and there was no diaphragmatic stimulation at 8  volts.  With these satisfactory parameters, the guiding catheter was removed  from the coronary sinus and the venous circulation without particular  difficulty.  The lead was secured to subpectoralis fascia with figure-of-  eight silk suture, and the sewing sleeve was also secured with silk suture.  At this point, the pocket was irrigated with kanamycin.  Electrocautery was  then utilized to enter the previously implanted ICD pocket, and the old  Medtronic dual-chamber ICD was removed without difficulty.  The the lead  were freed up from their dense fibrous adhesions with electrocautery.  The  pocket was irrigated with kanamycin.  Electrocautery was utilized to ensure  hemostasis.   The Medtronic InSync Maximo, model [id number] biventricular CD,  serial number [id number], was connected to the right atrial, right  ventricular, and left ventricular leads.  It should be noted that the right  ventricular threshold was 1.5 V at 0.5 msec, and the right atrial threshold  was 0.6 V at 0.5 msec.  After the leads were connected to the InSync Maximo  defibrillator, the device was placed back in the subcutaneous pocket.  Additional kanamycin was utilized to irrigate the pocket, and the incision  was closed with a layer of 2-0 Vicryl followed by a layer of 3-0 Vicryl.  At  this point, the patient was more deeply sedated with fentanyl and Versed.  and defibrillation threshold testing carried out.   VF was subsequently induced with T-wave shock, and a 15-joule shock was  delivered which failed to terminate VF.  A second 25-joule shock was  delivered then delivered which terminated VF and restored sinus rhythm.  At  this point, no additional defibrillation threshold testing was carried out,  and the patient was allowed to awaken and returned to his room in  satisfactory condition.  Benzoin and Steri-Strips were placed on the wound.   III. COMPLICATIONS:  There are immediate procedural complications.   IV. RESULTS:  This demonstrate successful removal of a previous implanted  dual-chamber ICD, insertion of a new left ventricular pacing lead, and  successful defibrillation threshold testing in a patient with an ischemic  cardiomyopathy, VT, and congestive heart failure with left bundle branch  block.  The patient's ejection fraction has been 20-25% for several years.           ______________________________  Doylene Canning. Ladona Ridgel, MD     GWT/MEDQ  D:  07/31/2006  T:  07/31/2006  Job:  [id number]   cc:   Bea Laura, MD,FACC

## 2011-03-04 NOTE — Discharge Summary (Signed)
NAME:  DONTAVIOUS, EMILY                         ACCOUNT NO.:  [id number]   MEDICAL RECORD NO.:  [id number]                   PATIENT TYPE:  AMB   LOCATION:  NESC                                 FACILITY:  [facility]   PHYSICIAN:  Shan Levans, M.D. [facility]            DATE OF BIRTH:  06-Feb-1941   DATE OF ADMISSION:  03/31/2004  DATE OF DISCHARGE:  04/01/2004                                 DISCHARGE SUMMARY   DISCHARGE DIAGNOSES:  1. Renal calculi with hematuria.  2. Pulmonary embolism, status post thyroidectomy.  3. Chronic obstructive pulmonary disease.   HISTORY OF PRESENT ILLNESS:  Mr. Kinta Martis is a 70 year old white male  half-pack per day smoker x40 years, who underwent completion of  thyroidectomy on March 26, 2004, per Dr. Gerrit Friends for thyroid carcinoma.  He  was discharged home and subsequently developed increasing shortness of  breath on June 11.  He presented to the [facility] with hypoxia,  transferred to [facility], and had a CT that was positive for  pulmonary emboli.  He had been treated with heparin and Lovenox, discharged  on Lovenox, but the a.m. of March 31, 2004, developed left flank pain and  hematuria.  He has known left renal calculi and had been under instructions  from Dr. Aldean Ast should he have hematuria to present and he would need a  renal stent.   LABORATORY DATA:  INR on June 15 was 2.1, was 1.7 on day of discharge.  Arterial blood gas, pH 7.43, PCO2 of 42, with a bicarb of 28, with a total  CO2 of 29.  WBC is 5.9, hemoglobin is 14.0, hematocrit 43.1, platelets are  240.  INR started at 2.3 on June 14, 2.1 on June 15, 1.9 later on June 15,  at 8:05 on June as of 0540 was 1.5.  Sodium 138, potassium 4.0, chloride  105, CO2 26, glucose is 83.  Troponin I is less than 0.5.  CK and MB was  2.2.  Calcium was 8.2.   HOSPITAL COURSE:  Problem 1.  HEMATURIA WITH FLANK PAIN:  He was admitted to Dr. Luisa Hart  Wright's service.  Dr. Aldean Ast  evaluated and performed evaluation and  stent on June 15 without difficulty.   Problem 2.  HISTORY OF PULMONARY EMBOLISM:  He will be restarted on his  Lovenox 70 mg b.i.d., and Coumadin 2 mg.  He has an appointment to follow up  with the Coumadin clinic on April 02, 2004, at 0900.   Problem 3.  CHRONIC OBSTRUCTIVE PULMONARY DISEASE:  He remained on his  Advair.   DISPOSITION/CONDITION ON DISCHARGE:  Improved.   His diet is as instructed.  He has a follow-up appointment with the Coumadin  clinic on April 02, 2004, at 0900, Dr. Shan Levans on June 23 at 0900.   DISPOSITION/CONDITION:  Improved.     Brett Canales Minor, A.C.N.P. [facility]  Shan Levans, M.D. [facility]    SM/MEDQ  D:  04/01/2004  T:  04/01/2004  Job:  [id number]   cc:   Rozanna Boer., M.D.  [address]  [place]  [place] [address]  Fax: [phone]   Velora Heckler, M.D.  [address]  [place]  [place] [address]  Fax: [phone]   Jonelle Sidle, M.D. [facility]

## 2011-03-04 NOTE — Op Note (Signed)
NAME:  KOLESON, REIFSTECK                         ACCOUNT NO.:  [id number]   MEDICAL RECORD NO.:  [id number]                   PATIENT TYPE:  AMB   LOCATION:  DAY                                  FACILITY:  [facility]   PHYSICIAN:  Velora Heckler, M.D.                DATE OF BIRTH:  04-13-1941   DATE OF PROCEDURE:  02/13/2004  DATE OF DISCHARGE:                                 OPERATIVE REPORT   PREOPERATIVE DIAGNOSIS:  Left thyroid nodule.   POSTOPERATIVE DIAGNOSIS:  Left thyroid nodule.   OPERATION/PROCEDURE:  Left thyroid lobectomy.   SURGEON:  Velora Heckler, M.D.   ASSISTANT:  Sheppard Plumber. Earlene Plater, M.D.   ANESTHESIA:  General per Dr. Sherrian Divers.   ESTIMATED BLOOD LOSS:  Minimal.   PREPARATION:  Betadine.   COMPLICATIONS:  None.   INDICATIONS:  The patient is a 70 year old white male seen at the request of  Dr. Talmage Nap for left thyroid nodule with Hurthle cell change on fine-needle  aspiration cytology.  The patient comes to surgery for left thyroid  lobectomy.   DESCRIPTION OF PROCEDURE:  The procedure is done in OR #11 at the [facility].  The patient is brought to the operating room and  placed in the supine position on the operating room table.  Following  administration of general anesthesia, the patient was positioned and then  prepped and draped in the usual strict aseptic fashion.   After ascertaining that an adequate level of anesthesia had been obtained, a  Kocher incision was made with the #15 blade.  Dissection was carried down  through the subcutaneous tissue and platysma.  Hemostasis was obtained with  the electrocautery.  Skin flaps are developed cephalad and caudad from  the  thyroid notch to the sternal notch.  A May-Horner self-retaining retractor  is placed for exposure.  Strap muscles are incised in the midline.  Dissection is begun on the left side.  Strap muscles are reflected  laterally.  Left thyroid lobe is exposed.  It contains  multiple nodules with  a dominant 2 cm nodule on the inferior portion.  Strap muscles were somewhat  adherent to the capsule but carefully dissected off.  Venous tributaries are  divided between small and medium Ligaclips.  Gland is rolled anteriorly.  Superior pole is mobilized.  Superior pole vessels are ligated in continuity  with 2-0 silk ties and medium Ligaclips and divided.  Gland is rolled  further anteriorly.  Branches of the inferior thyroid artery are divided  between small and medium Ligaclips.  Inferior venous tributaries are divided  between medium Ligaclips.  Gland is rolled further up onto the trachea.  Ligament of Allyson Sabal is transected with the electrocautery.  Branches of the  inferior thyroid artery are divided between small Ligaclips.  Recurrent  nerve is identified and preserved.  Parathyroid tissues identified and  preserved.  Gland is mobilized  across the isthmus.  The isthmus is  transected at this junction with the right thyroid lobe between hemostats.  These were suture ligated with 3-0 Vicryl suture ligatures.  Speculum is  suctioned on the table and then submitted to pathology for frozen section.  Dr. Star Age reviewed frozen section on several nodules and felt these  were all benign.  She saw no evidence of malignancy.  However, there was a  calcified nodule which will require further testing to determine its  underlying histology.   Good hemostasis is noted in the left neck.  Neck is irrigated with warm  saline.  Hemostasis obtained with the electrocautery.  Surgicel is placed  over the area of the recurrent nerve.  Strap muscles are reapproximated in  the midline with interrupted 3-0 Vicryl sutures.  Latissimus closed with  interrupted 3-0 Vicryl sutures.  Skin is closed with a running 4-0 Vicryl  subcuticular suture.  Wound is washed and dried and Benzoin and Steri-Strips  are applied.  Sterile dressings are applied.  The patient is awakened and  returned  to the recovery room in stable condition.  The patient tolerated  the procedure well.                                               Velora Heckler, M.D.    TMG/MEDQ  D:  02/13/2004  T:  02/13/2004  Job:  [id number]   cc:   Dorisann Frames, M.D.  [place], [place] [address]  Fax: [phone]   Doreen Beam  [address]  [place]  [place] [address]  Fax: [phone]   Rozanna Boer., M.D.  [address]  [place]  [place] [address]  Fax: [phone]

## 2011-03-04 NOTE — Discharge Summary (Signed)
NAME:  [name], [name]                   ACCOUNT NO.:  [id number]   MEDICAL RECORD NO.:  [id number]                   PATIENT TYPE:  INP   LOCATION:  0355                                 FACILITY:  [facility]   PHYSICIAN:  Rozanna Boer., M.D.      DATE OF BIRTH:  02-Apr-1941   DATE OF ADMISSION:  02/19/2003  DATE OF DISCHARGE:  02/22/2003                                 DISCHARGE SUMMARY   DISCHARGE DIAGNOSES:  1. T1c Gleason 3 + 3 adenocarcinoma of the prostate.  2. Elevated PSA.  3. History of myocardial infarction with stent in 2003.   OPERATIONS/PROCEDURES:  Radical retropubic prostatectomy on Feb 19, 2003.   BRIEF HISTORY:  This 70 year old patient was admitted with a clinical T1c  Gleason 3 + 3 right-sided adenocarcinoma of the prostate and elevated PSA  for radical retropubic prostatectomy.  He had a biopsy for PSA of 6.8 in  March 2004, which showed the cancer as described.  He had about 10% of the  biopsy on the right side involved, the left was negative.  He had a  hypoechoic area on the right.  Daughter donated two units of blood and after  discussion of various treatment options, decided to go ahead with surgery at  this time.  He understood the risks including but not limited to bleeding,  incontinence, impotence, deep vein thrombosis, pulmonary emboli, and death.  He had had a previous negative biopsy by Dr. Rito Ehrlich in [place] in the year  2000.   HOSPITAL COURSE:  After satisfactory preop evaluation, he was taken to the  operating room where he underwent radical retropubic prostatectomy.  The  path report showed marked acute prostatitis with associated necrotic glands,  no definite residual invasive cancer seen.  The original path was reviewed  and he was at final T1c NX MX cancer of the prostate.  He had an uneventful  postop course.  He had a little low-grade fever postop but it seemed it  never got above 101.  His hematocrit was 38% postop.  His JP  drain was  removed on the second postop day and he was discharged home on the third  postop day in satisfactory ambulatory condition on a regular diet and in  improved condition.   FOLLOW UP:  He will come back in a week for his suture removal and in two  weeks for his catheter removal after cystogram in the office.   CONDITION ON DISCHARGE:  He was sent home in improved ambulatory condition.   DISCHARGE MEDICATIONS:  1. Coreg 3.125 mg daily.  2. Mavik 2 mg b.i.d.  3. Terazosin 2 mg daily.  4. Synthroid.  5. He will hold off the Plavix and aspirin for now.  Rozanna Boer., M.D.    HMK/MEDQ  D:  03/08/2003  T:  03/08/2003  Job:  [id number]

## 2011-03-04 NOTE — Assessment & Plan Note (Signed)
[facility]                           ELECTROPHYSIOLOGY OFFICE NOTE   NAME:BAILEYDonie, Lemelin                      MRN:          [id number]  DATE:08/09/2006                            DOB:          Apr 02, 1941    Mr. Montilla was seen today in the clinic on August 09, 2006 for a wound  check of his newly upgraded Medtronic model [id number] Maximo.  Date of implant  was July 31, 2006 for ischemic cardiomyopathy.  On interrogation of his  device, today his battery voltage is 3.17, with a charge time of 8.63  seconds. P wave measured 4.2 MV with an atrial capture threshold of 1 V at  0.2 Msec and an atrial lead impedance of 552 ohms.  R waves measured 6.4 MV  with right ventricular pacing threshold of 1.5 V at 0.5 Msec and a right  ventricular lead impedance of 520.  Left ventricular pacing threshold was 1  volt at 0.1 Msec with a left ventricular lead impedance of 400 ohms.  Shock  impedance was 49 ohms.  There were no episodes since implant date.  I did  decrease his atrial output to 1 V at 0.4 Msec. The new lead that was put in  was his LV lead on October 15.  His Steri-Strips were removed along with  sutures from a pneumothorax during his hospital stay.  All wounds were  without redness or edema.  His device today was reprogrammed per protocol  for his lead alert and he will have a return office visit in February of  2008.      ______________________________  Altha Harm, LPN    ______________________________  Doylene Canning. Ladona Ridgel, MD    PO/MedQ  DD:  08/09/2006  DT:  08/10/2006  Job #:  [id number]

## 2011-03-04 NOTE — Assessment & Plan Note (Signed)
[facility]                         ELECTROPHYSIOLOGY OFFICE NOTE   NAME:[name], [name]                      MRN:          [id number]  DATE:01/16/2007                            DOB:          1941-06-17    Mr. Slabach returns today for ICD followup and ongoing evaluation of his  congestive heart failure and ischemic cardiomyopathy.  The patient  underwent upgrade to a Bi-V ICD several months ago with his procedure  complicated by a fairly large pneumothorax for which he underwent chest  tube placement and for which he had no residual problems.  The patient  is back to maintaining a very vigorous active schedule.  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  He denied chest pain today and denied  shortness of breath and otherwise no specific complaints.   EXAM:  He is a pleasant well-appearing man in no distress.  The blood  pressure was 135/64, the pulse 56 and regular, the respirations were 18  and the weight was 147 pounds.  NECK:  No jugular venous distention.  LUNGS:  Clear bilaterally to auscultation.  No wheezes, rales or rhonchi  are present.  CARDIOVASCULAR:  A regular rate and rhythm with a normal S1 and S2.  EXTREMITIES:  Demonstrated no edema.   MEDICATIONS:  1. Aspirin.  2. Lipitor.  3. Coreg 25 twice a day.  4. Synthroid.  5. Digoxin 0.25 daily.  6. Aldactone.  7. Lasix.  8. Niacin.   Interrogation of his defibrillator demonstrates a Medtronic Maximo with  P and R waves of 4 and 10, the impedance 560 in the atrium, 456 in the  ventricle, 376 in the left ventricle.  The threshold was 1 at 0.2 in the  right atrium, 2 at 0.4 in the right ventricle and 1 at 0.2 in the left  ventricle.  Today his outputs were decreased as per protocol in the LV.   IMPRESSION:  1. Ischemic cardiomyopathy.  2. Congestive heart failure.  3. Status post biventricular implantable cardioverter-defibrillator      insertion.   DISCUSSION:  Overall, Mr.  Wrinkle is stable and his defibrillator is  working normally.  Will plan to see him back in the office in October of  2008.     Doylene Canning. Ladona Ridgel, MD  Electronically Signed    GWT/MedQ  DD: 01/16/2007  DT: 01/16/2007  Job #: [id number]   cc:   Learta Codding, MD,FACC  Dhruv Sherril Croon

## 2011-03-04 NOTE — H&P (Signed)
NAME:  [name], [name]                   ACCOUNT NO.:  [id number]   MEDICAL RECORD NO.:  [id number]                   PATIENT TYPE:  INP   LOCATION:  NA                                   FACILITY:  [facility]   PHYSICIAN:  Rozanna Boer., M.D.      DATE OF BIRTH:  [birth year]   DATE OF ADMISSION:  02/18/2003  DATE OF DISCHARGE:                                HISTORY & PHYSICAL   HISTORY OF PRESENT ILLNESS:  This 70 year old patient is admitted with a  clinical T1C Gleason 3+3 right-sided adenocarcinoma of the prostate and  elevated PSA for radical retropubic prostatectomy.  He had a biopsy for a  PSA of 6.8 on in 3/04, that showed the cancer as described.  He had about  10% of the biopsy on the right side was involved, the left was negative.  He  had a hypoechoic area on the right.  He auto donated 2 units of blood, and  after discussion of the various treatment options, he is admitted now for  radical surgery.  He understands the risks, including, but not limited to  bleeding, incontinence, impotence, deep vein thrombosis, pulmonary emboli,  and death.  He had a previous negative biopsy by Dr. Rito Ehrlich up in [place] in  2000.   MEDICATIONS:  1. Coreg 3.125 mg daily.  2. Mavik 2 mg b.i.d.  3. Terazosin 2 mg p.o. daily.  4. Synthroid.   He has been off his Plavix and aspirin for some time.   ALLERGIES:  No known drug allergies.   PAST SURGICAL HISTORY:  None other then his cardiac stent in 2/03, after an  myocardial infarction.   REVIEW OF SYMPTOMS:  Other then the myocardial infarction, he has been in  good general health.  He smokes one pack a day, but did quit for three to  four months after the myocardial infarction briefly.  No shortness of  breath, change of bowel habits, or eating problem.   SOCIAL HISTORY/FAMILY HISTORY:  He is a self-employed Nutritional therapist.  He has two  daughters.  One with type 1 diabetes, as does his sister.  No other family  history of  prostate cancer.   PHYSICAL EXAMINATION:  VITAL SIGNS:  Temperature is afebrile, pulse 58,  respirations 18, weight 148, blood pressure 134/67.  GENERAL:  He is a healthy middle-aged male in no acute distress.  HEENT:  Clear.  CHEST:  Clear to auscultation and percussion, no murmurs, rubs, or gallops.  ABDOMEN:  Soft and benign.  His kidneys, liver, and spleen are not palpable.  GENITOURINARY:  Normal circumcised penis, bilaterally descended testes  without tenderness.  Epididymis normal.  His prostate is 65 to 70 g in size,  not fixed or indurated, SV is not palpable.  EXTREMITIES:  No edema.   IMPRESSION:  1. T1C right-sided adenocarcinoma of the prostate, Gleason 3+3.  2.     Elevated PSA.  3. History of myocardial infarction with stent in 2003.  RECOMMENDATIONS:  Radical retropubic prostatectomy, bilateral pelvic lymph  node dissection as discussed.                                                Rozanna Boer., M.D.    HMK/MEDQ  D:  02/18/2003  T:  02/18/2003  Job:  [id number]

## 2011-03-22 ENCOUNTER — Ambulatory Visit (INDEPENDENT_AMBULATORY_CARE_PROVIDER_SITE_OTHER): Payer: Medicare Other | Admitting: Internal Medicine

## 2011-03-22 DIAGNOSIS — R1310 Dysphagia, unspecified: Secondary | ICD-10-CM

## 2011-03-31 ENCOUNTER — Encounter: Payer: Self-pay | Admitting: Cardiology

## 2011-05-06 ENCOUNTER — Encounter: Payer: Self-pay | Admitting: Internal Medicine

## 2011-05-10 ENCOUNTER — Ambulatory Visit (INDEPENDENT_AMBULATORY_CARE_PROVIDER_SITE_OTHER): Payer: Medicare Other | Admitting: Internal Medicine

## 2011-05-10 ENCOUNTER — Encounter: Payer: Self-pay | Admitting: Internal Medicine

## 2011-05-10 VITALS — BP 134/71 | HR 79 | Resp 14 | Ht 68.0 in | Wt 145.0 lb

## 2011-05-10 DIAGNOSIS — I2589 Other forms of chronic ischemic heart disease: Secondary | ICD-10-CM

## 2011-05-10 DIAGNOSIS — I472 Ventricular tachycardia: Secondary | ICD-10-CM

## 2011-05-10 DIAGNOSIS — I5022 Chronic systolic (congestive) heart failure: Secondary | ICD-10-CM

## 2011-05-10 DIAGNOSIS — Z95810 Presence of automatic (implantable) cardiac defibrillator: Secondary | ICD-10-CM

## 2011-05-10 NOTE — Assessment & Plan Note (Signed)
His device is working normally. LV and RV leads are working satisfactorily.

## 2011-05-10 NOTE — Assessment & Plan Note (Signed)
He denies any anginal symptoms. He'll continue his current medical therapy.

## 2011-05-10 NOTE — Progress Notes (Signed)
HPI [name] returns for followup. He is a pleasant 70 yo man with a h/o chronic systolic CHF, ICM, VT, s/p BiV ICD. He underwent ICD generator change in April. He denies chest pain or sob. No syncope. He notes that some days are better than others.  No Known Allergies   Current Outpatient Prescriptions  Medication Sig Dispense Refill  . aspirin 81 MG tablet Take 81 mg by mouth daily. 2 po daily       . carvedilol (COREG) 25 MG tablet Take 25 mg by mouth 2 (two) times daily with a meal.        . digoxin (LANOXIN) 0.25 MG tablet Take 250 mcg by mouth daily.        Marland Kitchen eplerenone (INSPRA) 25 MG tablet Take 25 mg by mouth daily.        . fish oil-omega-3 fatty acids 1000 MG capsule Take 2 g by mouth daily.        . furosemide (LASIX) 20 MG tablet Take 20 mg by mouth daily.       Marland Kitchen levothyroxine (SYNTHROID, LEVOTHROID) 150 MCG tablet Take 150 mcg by mouth daily.        . niacin 500 MG tablet Take 500 mg by mouth daily with breakfast.        . simvastatin (ZOCOR) 20 MG tablet Take 20 mg by mouth at bedtime.        . trandolapril (MAVIK) 2 MG tablet 1 1/2 tab po qd      . pantoprazole (PROTONIX) 40 MG tablet Take 1 tablet by mouth Daily.         Past Medical History  Diagnosis Date  . NEPHROLITHIASIS 04/10/2009  . PULMONARY EMBOLISM 04/10/2009  . COPD 04/10/2009  . VENTRICULAR TACHYCARDIA 04/10/2009  . CHF 04/10/2009  . RENAL CALCULUS, HX OF 04/10/2009  . CARDIOMYOPATHY, ISCHEMIC 04/10/2009  . CORONARY ATHEROSCLEROSIS NATIVE CORONARY ARTERY 08/23/2010  . CAROTID ARTERY DISEASE 07/20/2009    aortic insufficiency  . DYSLIPIDEMIA 08/23/2010  . Pulmonary HTN     ROS:   All systems reviewed and negative except as noted in the HPI.   Past Surgical History  Procedure Date  . Thoracotomy     left  . Coronary stent placement   . Retropubic prostatectomy   . Inguinal hernia repair   . Cardiac defibrillator placement     medtronic, remote - yes  . Left breast biopsy      Family History    Problem Relation Age of Onset  . Diabetes type I Sister     daughter   . Clotting disorder Neg Hx     also no repiratory disease   . Prostate cancer Neg Hx      History   Social History  . Marital Status: Married    Spouse Name: N/A    Number of Children: 2  . Years of Education: N/A   Occupational History  . plumber - self employed    Social History Main Topics  . Smoking status: Former Games developer  . Smokeless tobacco: Not on file  . Alcohol Use: No  . Drug Use: No  . Sexually Active: Not on file   Other Topics Concern  . Not on file   Social History Narrative   Self employed Nutritional therapist; 2 daughters.      BP 134/71  Pulse 79  Resp 14  Ht 5\' 8"  (1.727 m)  Wt 145 lb (65.772 kg)  BMI 22.05 kg/m2  Physical Exam:  Well appearing NAD HEENT: Unremarkable Neck:  No JVD, no thyromegally Lymphatics:  No adenopathy Back:  No CVA tenderness Lungs:  Clear. Well-healed ICD incision. The lateral margin of the skin is retracted. HEART:  Regular rate rhythm, no murmurs, no rubs, no clicks Abd:  soft, positive bowel sounds, no organomegally, no rebound, no guarding Ext:  2 plus pulses, no edema, no cyanosis, no clubbing Skin:  No rashes no nodules Neuro:  CN II through XII intact, motor grossly intact  DEVICE  Normal device function.  See PaceArt for details.   Assess/Plan:

## 2011-05-10 NOTE — Assessment & Plan Note (Signed)
His symptoms remain class II. I've instructed him on the importance of low sodium diet and continued medical therapy.

## 2011-05-10 NOTE — Patient Instructions (Addendum)
Your physician wants you to follow-up in: April 2013 with Dr Court Joy will receive a reminder letter in the mail two months in advance. If you don't receive a letter, please call our office to schedule the follow-up appointment.   Remote monitoring is used to monitor your Pacemaker of ICD from home. This monitoring reduces the number of office visits required to check your device to one time per year. It allows [place] to keep an eye on the functioning of your device to ensure it is working properly. You are scheduled for a device check from home on 08/11/2011. You may send your transmission at any time that day. If you have a wireless device, the transmission will be sent automatically. After your physician reviews your transmission, you will receive a postcard with your next transmission date.

## 2011-05-11 ENCOUNTER — Ambulatory Visit (INDEPENDENT_AMBULATORY_CARE_PROVIDER_SITE_OTHER): Payer: Medicare Other | Admitting: Cardiology

## 2011-05-11 ENCOUNTER — Ambulatory Visit: Payer: Medicare Other | Admitting: Cardiology

## 2011-05-11 ENCOUNTER — Encounter: Payer: Self-pay | Admitting: Cardiology

## 2011-05-11 VITALS — BP 138/73 | HR 69 | Resp 18 | Ht 67.0 in | Wt 146.4 lb

## 2011-05-11 DIAGNOSIS — Z95810 Presence of automatic (implantable) cardiac defibrillator: Secondary | ICD-10-CM

## 2011-05-11 DIAGNOSIS — I472 Ventricular tachycardia: Secondary | ICD-10-CM

## 2011-05-11 DIAGNOSIS — T50905A Adverse effect of unspecified drugs, medicaments and biological substances, initial encounter: Secondary | ICD-10-CM | POA: Insufficient documentation

## 2011-05-11 DIAGNOSIS — I351 Nonrheumatic aortic (valve) insufficiency: Secondary | ICD-10-CM

## 2011-05-11 DIAGNOSIS — I255 Ischemic cardiomyopathy: Secondary | ICD-10-CM

## 2011-05-11 DIAGNOSIS — I2589 Other forms of chronic ischemic heart disease: Secondary | ICD-10-CM

## 2011-05-11 DIAGNOSIS — I519 Heart disease, unspecified: Secondary | ICD-10-CM

## 2011-05-11 DIAGNOSIS — E785 Hyperlipidemia, unspecified: Secondary | ICD-10-CM | POA: Insufficient documentation

## 2011-05-11 DIAGNOSIS — I779 Disorder of arteries and arterioles, unspecified: Secondary | ICD-10-CM

## 2011-05-11 DIAGNOSIS — N62 Hypertrophy of breast: Secondary | ICD-10-CM

## 2011-05-11 DIAGNOSIS — I10 Essential (primary) hypertension: Secondary | ICD-10-CM

## 2011-05-11 DIAGNOSIS — I2699 Other pulmonary embolism without acute cor pulmonale: Secondary | ICD-10-CM

## 2011-05-11 NOTE — Assessment & Plan Note (Signed)
Status post recent ICD change out. No recurrent discharges.

## 2011-05-11 NOTE — Patient Instructions (Signed)
Continue all current medications. Your physician wants you to follow up in: 6 months.  You will receive a reminder letter in the mail one-two months in advance.  If you don't receive a letter, please call our office to schedule the follow up appointment   

## 2011-05-11 NOTE — Assessment & Plan Note (Signed)
Ejection fraction normalized, either spontaneously or due to CRT-D in combination with medical therapy. No heart failure admissions. Continue current medical therapy.

## 2011-05-11 NOTE — Progress Notes (Signed)
HPI The patient is a 70 year old male with a prior history of ischemic cardiomyopathy status post non-ST elevation myocardial infarction with stent to the right coronary artery. Prior ejection fraction of 20-25% and the patient has a history of inducible ventricular tachycardia. He status post initial ICD placement followed several years later by CRT-D. He had a recent battery change out. He reports no recurrent palpitations presyncope or syncope. He also has no recurrent chest pain. He has a history of moderate asymptomatic aortic insufficiency. His ejection fraction over the years has normalized to 55% by 2009. He has had no hospitalizations of the last several years for congestive heart failure. He denies any orthopnea PND palpitations or syncope. He is asymptomatic and nonobstructive carotid artery disease. A limited bedside echocardiogram was performed today and the patient has mild inferior hypokinesis, mild mitral regurgitation with an ejection fraction of approximately 55-60% and moderate aortic insufficiency.  No Known Allergies  Current Outpatient Prescriptions on File Prior to Visit  Medication Sig Dispense Refill  . aspirin 81 MG tablet Take 81 mg by mouth daily. 2 po daily       . carvedilol (COREG) 25 MG tablet Take 25 mg by mouth 2 (two) times daily with a meal.        . digoxin (LANOXIN) 0.25 MG tablet Take 250 mcg by mouth daily.        Marland Kitchen eplerenone (INSPRA) 25 MG tablet Take 25 mg by mouth daily.        . fish oil-omega-3 fatty acids 1000 MG capsule Take 2 g by mouth daily.        . furosemide (LASIX) 20 MG tablet Take 20 mg by mouth daily.       Marland Kitchen levothyroxine (SYNTHROID, LEVOTHROID) 150 MCG tablet Take 150 mcg by mouth daily.        . niacin 500 MG tablet Take 500 mg by mouth daily with breakfast.        . pantoprazole (PROTONIX) 40 MG tablet Take 1 tablet by mouth Daily.      . simvastatin (ZOCOR) 20 MG tablet Take 20 mg by mouth at bedtime.        . trandolapril (MAVIK) 2 MG  tablet 1 1/2 tab po qd        Past Medical History  Diagnosis Date  . Ischemic cardiomyopathy      status post non-ST elevation microinfarction stent to the right coronary artery is a   . LV dysfunction      NYHA class I/prior ejection fraction 20-25% and an ejection fraction 7 2009 50-55%, ejection fraction 60% bedside echocardiogram July 2012   . Ventricular tachycardia     Inducible ventricular polymorphic tachycardia status post ICD followed by upgrade with CRT-D 2007, battery change at 01/31/2011  . Drug-induced gynecomastia      secondary to spironolactone  . Carotid artery disease      less than 50% stenosis bilaterally  . Aortic insufficiency      moderate aortic ins moderate/asymptomatic/normal LV cavity size.   Marland Kitchen Dyslipidemia   . Pulmonary embolism     Prior history of pulmonary embolism off Coumadin.  . ICD (implantable cardiac defibrillator), biventricular, in situ      Medtronic model  D314TRG    Past Surgical History  Procedure Date  . Thoracotomy     left  . Coronary stent placement   . Retropubic prostatectomy   . Inguinal hernia repair   . Cardiac defibrillator placement  medtronic, remote - yes  . Left breast biopsy     Family History  Problem Relation Age of Onset  . Diabetes type I Sister     daughter   . Clotting disorder Neg Hx     also no repiratory disease   . Prostate cancer Neg Hx     History   Social History  . Marital Status: Married    Spouse Name: N/A    Number of Children: 2  . Years of Education: N/A   Occupational History  . plumber - self employed    Social History Main Topics  . Smoking status: Former Games developer  . Smokeless tobacco: Not on file  . Alcohol Use: No  . Drug Use: No  . Sexually Active: Not on file   Other Topics Concern  . Not on file   Social History Narrative   Self employed Nutritional therapist; 2 daughters.    QIO:NGEXBMWUX positives as outlined above. The remainder of the 18  point review of systems is  negative  PHYSICAL EXAM BP 138/73  Pulse 69  Resp 18  Ht 5\' 7"  (1.702 m)  Wt 146 lb 6.4 oz (66.407 kg)  BMI 22.93 kg/m2  SpO2 93%  General: Well-developed, well-nourished in no distress Head: Normocephalic and atraumatic Eyes:PERRLA/EOMI intact, conjunctiva and lids normal Ears: No deformity or lesions Mouth:normal dentition, normal posterior pharynx Neck: Supple, no JVD.  No masses, thyromegaly or abnormal cervical nodes Lungs: Normal breath sounds bilaterally without wheezing.  Normal percussion Chest: Well positioned defibrillator with no evidence of erythema or swelling Cardiac: regular rate and rhythm with normal S1 and S2, no S3 or S4.  PMI is normal.  No pathological murmurs Abdomen: Normal bowel sounds, abdomen is soft and nontender without masses, organomegaly or hernias noted.  No hepatosplenomegaly MSK: Back normal, normal gait muscle strength and tone normal Vascular: Pulse is normal in all 4 extremities Extremities: No peripheral pitting edema Neurologic: Alert and oriented x 3 Skin: Intact without lesions or rashes Lymphatics: No significant adenopathy Psychologic: Normal affect   ECG: Not available  ASSESSMENT AND PLAN

## 2011-05-11 NOTE — Assessment & Plan Note (Signed)
Asymptomatic moderate aortic insufficiency but no evidence of ventricular dilatation. We will continue to monitor this. No indication to adjust current medical therapy based on recent data of prior flawed data with vasodilator therapy in asymptomatic aortic insufficiency.

## 2011-05-11 NOTE — Assessment & Plan Note (Signed)
Evidence for wall motion abnormality in the inferior wall, but asymptomatic in no recurrent chest pain. No definite indication for ischemia testing.

## 2011-07-29 LAB — BASIC METABOLIC PANEL
Calcium: 9.4
GFR calc Af Amer: >60
GFR calc non Af Amer: >60
Glucose, Bld: 85
Potassium: 4
Sodium: 141

## 2011-07-29 LAB — CBC
HCT: 42.8
Hemoglobin: 14.9
RBC: 4.98
RDW: 13.7
WBC: 7.3

## 2011-08-11 ENCOUNTER — Other Ambulatory Visit: Payer: Self-pay | Admitting: Internal Medicine

## 2011-08-11 ENCOUNTER — Encounter: Payer: Self-pay | Admitting: Internal Medicine

## 2011-08-11 ENCOUNTER — Ambulatory Visit (INDEPENDENT_AMBULATORY_CARE_PROVIDER_SITE_OTHER): Payer: Medicare Other | Admitting: *Deleted

## 2011-08-11 DIAGNOSIS — I472 Ventricular tachycardia: Secondary | ICD-10-CM

## 2011-08-11 DIAGNOSIS — I5022 Chronic systolic (congestive) heart failure: Secondary | ICD-10-CM

## 2011-08-11 DIAGNOSIS — I255 Ischemic cardiomyopathy: Secondary | ICD-10-CM

## 2011-08-11 DIAGNOSIS — Z95810 Presence of automatic (implantable) cardiac defibrillator: Secondary | ICD-10-CM

## 2011-08-11 DIAGNOSIS — I2589 Other forms of chronic ischemic heart disease: Secondary | ICD-10-CM

## 2011-08-14 LAB — REMOTE ICD DEVICE
AL IMPEDENCE ICD: 513 Ohm
AL THRESHOLD: 0.5 V
BAMS-0001: 170 {beats}/min
BATTERY VOLTAGE: 3.1756 V
PACEART VT: 0
RV LEAD AMPLITUDE: 13.9 mv
RV LEAD THRESHOLD: 1 V
TOT-0002: 0
TOT-0006: 20120416000000
TZAT-0001ATACH: 2
TZAT-0001ATACH: 3
TZAT-0001FASTVT: 1
TZAT-0001SLOWVT: 1
TZAT-0001SLOWVT: 2
TZAT-0002ATACH: NEGATIVE
TZAT-0002FASTVT: NEGATIVE
TZAT-0004SLOWVT: 8
TZAT-0004SLOWVT: 8
TZAT-0012ATACH: 150 ms
TZAT-0012ATACH: 150 ms
TZAT-0012FASTVT: 200 ms
TZAT-0013SLOWVT: 2
TZAT-0013SLOWVT: 2
TZAT-0018ATACH: NEGATIVE
TZAT-0018ATACH: NEGATIVE
TZAT-0018FASTVT: NEGATIVE
TZAT-0019ATACH: 6 V
TZAT-0020ATACH: 1.5 ms
TZAT-0020SLOWVT: 1.5 ms
TZAT-0020SLOWVT: 1.5 ms
TZON-0003ATACH: 350 ms
TZON-0003VSLOWVT: 450 ms
TZON-0004VSLOWVT: 20
TZST-0001ATACH: 6
TZST-0001FASTVT: 4
TZST-0001FASTVT: 6
TZST-0001SLOWVT: 3
TZST-0001SLOWVT: 5
TZST-0002ATACH: NEGATIVE
TZST-0002FASTVT: NEGATIVE
TZST-0002FASTVT: NEGATIVE
TZST-0003SLOWVT: 35 J
VF: 0

## 2011-08-19 NOTE — Progress Notes (Signed)
icd remote w/icm  

## 2011-09-09 ENCOUNTER — Encounter: Payer: Self-pay | Admitting: *Deleted

## 2011-10-25 ENCOUNTER — Other Ambulatory Visit: Payer: Self-pay | Admitting: *Deleted

## 2011-10-25 MED ORDER — FUROSEMIDE 20 MG PO TABS: 20.0000 mg | ORAL_TABLET | Freq: Every day | ORAL | Status: DC

## 2011-10-25 MED ORDER — SIMVASTATIN 20 MG PO TABS: 20.0000 mg | ORAL_TABLET | Freq: Every day | ORAL | Status: DC

## 2011-10-25 MED ORDER — TRANDOLAPRIL 2 MG PO TABS: 3.0000 mg | ORAL_TABLET | Freq: Every day | ORAL | Status: DC

## 2011-10-25 MED ORDER — CARVEDILOL 25 MG PO TABS: 25.0000 mg | ORAL_TABLET | Freq: Two times a day (BID) | ORAL | Status: DC

## 2011-10-25 MED ORDER — EPLERENONE 25 MG PO TABS: 25.0000 mg | ORAL_TABLET | Freq: Every day | ORAL | Status: DC

## 2011-10-25 MED ORDER — DIGOXIN 250 MCG PO TABS: 250.0000 ug | ORAL_TABLET | Freq: Every day | ORAL | Status: DC

## 2011-11-17 ENCOUNTER — Ambulatory Visit (INDEPENDENT_AMBULATORY_CARE_PROVIDER_SITE_OTHER): Payer: Medicare Other | Admitting: *Deleted

## 2011-11-17 ENCOUNTER — Encounter: Payer: Self-pay | Admitting: Internal Medicine

## 2011-11-17 DIAGNOSIS — I5022 Chronic systolic (congestive) heart failure: Secondary | ICD-10-CM

## 2011-11-17 DIAGNOSIS — I472 Ventricular tachycardia: Secondary | ICD-10-CM

## 2011-11-17 DIAGNOSIS — Z95810 Presence of automatic (implantable) cardiac defibrillator: Secondary | ICD-10-CM

## 2011-11-19 LAB — REMOTE ICD DEVICE
AL IMPEDENCE ICD: 551 Ohm
AL THRESHOLD: 0.5 V
BATTERY VOLTAGE: 3.1756 V
CHARGE TIME: 8.668 s
LV LEAD IMPEDENCE ICD: 456 Ohm
TOT-0001: 1
TOT-0002: 0
TOT-0006: 20120416000000
TZAT-0001ATACH: 1
TZAT-0001ATACH: 3
TZAT-0001FASTVT: 1
TZAT-0001SLOWVT: 1
TZAT-0001SLOWVT: 2
TZAT-0002ATACH: NEGATIVE
TZAT-0002FASTVT: NEGATIVE
TZAT-0004SLOWVT: 8
TZAT-0012ATACH: 150 ms
TZAT-0012ATACH: 150 ms
TZAT-0012SLOWVT: 200 ms
TZAT-0012SLOWVT: 200 ms
TZAT-0013SLOWVT: 2
TZAT-0013SLOWVT: 2
TZAT-0018ATACH: NEGATIVE
TZAT-0018ATACH: NEGATIVE
TZAT-0018FASTVT: NEGATIVE
TZAT-0018SLOWVT: NEGATIVE
TZAT-0018SLOWVT: NEGATIVE
TZAT-0019ATACH: 6 V
TZAT-0019SLOWVT: 8 V
TZAT-0020ATACH: 1.5 ms
TZAT-0020SLOWVT: 1.5 ms
TZAT-0020SLOWVT: 1.5 ms
TZON-0003ATACH: 350 ms
TZON-0003SLOWVT: 350 ms
TZON-0004SLOWVT: 20
TZON-0005SLOWVT: 12
TZST-0001ATACH: 4
TZST-0001ATACH: 5
TZST-0001ATACH: 6
TZST-0001FASTVT: 2
TZST-0001FASTVT: 3
TZST-0001FASTVT: 4
TZST-0001FASTVT: 6
TZST-0001SLOWVT: 3
TZST-0001SLOWVT: 5
TZST-0001SLOWVT: 6
TZST-0002ATACH: NEGATIVE
TZST-0002ATACH: NEGATIVE
TZST-0002FASTVT: NEGATIVE
TZST-0002FASTVT: NEGATIVE
TZST-0002FASTVT: NEGATIVE
TZST-0003SLOWVT: 35 J
TZST-0003SLOWVT: 35 J
VENTRICULAR PACING ICD: 99.95 pct

## 2011-11-23 ENCOUNTER — Encounter: Payer: Self-pay | Admitting: *Deleted

## 2011-11-23 NOTE — Progress Notes (Signed)
Remote defib check w/icm  

## 2011-12-13 ENCOUNTER — Encounter: Payer: Self-pay | Admitting: Cardiology

## 2011-12-13 ENCOUNTER — Ambulatory Visit (INDEPENDENT_AMBULATORY_CARE_PROVIDER_SITE_OTHER): Payer: Medicare Other | Admitting: Cardiology

## 2011-12-13 VITALS — BP 121/63 | HR 62 | Resp 18 | Ht 69.0 in | Wt 140.0 lb

## 2011-12-13 DIAGNOSIS — I5022 Chronic systolic (congestive) heart failure: Secondary | ICD-10-CM

## 2011-12-13 NOTE — Progress Notes (Signed)
[name] Bottoms, MD, [facility] Board Certified in Adult Cardiovascular Medicine,Internal Medicine and Critical Care Medicine    CC: Routine followup patient with ischemic cardiomyopathy  HPI:  The patient reports no recurrent chest pain or shortness of breath. He has no orthopnea PND. He had a defibrillator changeouts to do battery status. He reports no countershocks. Since 2009 his ejection fraction is improved to 55% which was confirmed during his last clinic visit. He does have moderate aortic insufficiency but is asymptomatic. He has no palpitations or syncope. He has not required any admissions for heart failure. Compliant with his medical regimen and is on a full complement of heart failure medications.  PMH: reviewed and listed in Problem List in Electronic Records (and see below) Past Medical History  Diagnosis Date  . Ischemic cardiomyopathy      status post non-ST elevation microinfarction stent to the right coronary artery is a   . LV dysfunction      NYHA class I/prior ejection fraction 20-25% and an ejection fraction 7 2009 50-55%, ejection fraction 60% bedside echocardiogram July 2012   . Ventricular tachycardia     Inducible ventricular polymorphic tachycardia status post ICD followed by upgrade with CRT-D 2007, battery change at 01/31/2011  . Drug-induced gynecomastia      secondary to spironolactone  . Carotid artery disease      less than 50% stenosis bilaterally  . Aortic insufficiency      moderate aortic ins moderate/asymptomatic/normal LV cavity size.   Marland Kitchen Dyslipidemia   . Pulmonary embolism     Prior history of pulmonary embolism off Coumadin.  . ICD (implantable cardiac defibrillator), biventricular, in situ      Medtronic model  D314TRG   Past Surgical History  Procedure Date  . Thoracotomy     left  . Coronary stent placement   . Retropubic prostatectomy   . Inguinal hernia repair   . Cardiac defibrillator placement     medtronic, remote - yes  . Left  breast biopsy     Allergies/SH/FHX : available in Electronic Records for review  No Known Allergies History   Social History  . Marital Status: Married    Spouse Name: N/A    Number of Children: 2  . Years of Education: N/A   Occupational History  . plumber - self employed    Social History Main Topics  . Smoking status: Former Games developer  . Smokeless tobacco: Not on file  . Alcohol Use: No  . Drug Use: No  . Sexually Active: Not on file   Other Topics Concern  . Not on file   Social History Narrative   Self employed Nutritional therapist; 2 daughters.    Family History  Problem Relation Age of Onset  . Diabetes type I Sister     daughter   . Clotting disorder Neg Hx     also no repiratory disease   . Prostate cancer Neg Hx     Medications: Current Outpatient Prescriptions  Medication Sig Dispense Refill  . aspirin 81 MG tablet Take 81 mg by mouth daily. 2 po daily       . carvedilol (COREG) 25 MG tablet Take 1 tablet (25 mg total) by mouth 2 (two) times daily with a meal.  180 tablet  3  . digoxin (LANOXIN) 0.25 MG tablet Take 1 tablet (250 mcg total) by mouth daily.  90 tablet  3  . eplerenone (INSPRA) 25 MG tablet Take 1 tablet (25 mg total) by  mouth daily.  90 tablet  3  . fish oil-omega-3 fatty acids 1000 MG capsule Take 2 g by mouth daily.        . furosemide (LASIX) 20 MG tablet Take 1 tablet (20 mg total) by mouth daily.  90 tablet  3  . levothyroxine (SYNTHROID, LEVOTHROID) 150 MCG tablet Take 125 mcg by mouth daily.       . niacin 500 MG tablet Take 500 mg by mouth daily with breakfast.        . simvastatin (ZOCOR) 20 MG tablet Take 1 tablet (20 mg total) by mouth at bedtime.  90 tablet  3  . trandolapril (MAVIK) 2 MG tablet Take 1.5 tablets (3 mg total) by mouth daily.  90 tablet  3  . pantoprazole (PROTONIX) 40 MG tablet Take 1 tablet by mouth Daily.        ROS: No nausea or vomiting. No fever or chills.No melena or hematochezia.No bleeding.No claudication.  Physical  Exam: BP 121/63  Pulse 62  Resp 18  Ht 5\' 9"  (1.753 m)  Wt 140 lb (63.504 kg)  BMI 20.67 kg/m2  SpO2 93% General: Well-nourished white male. Neck: Normal carotid upstroke no carotid bruit. No thyromegaly nonnodular thyroid. JVP is 5-6 cm Lungs: Clear breath sounds bilaterally. No wheezing Cardiac: Regular rate and rhythm. Normal S1-S2. Vascular: No edema. Normal distal pulses Skin: Warm and dry Physcologic: Normal affect  12lead ECG: AV sequential paced rhythm, biventricular pacing at a of as a nurse visit in Limited bedside ECHO:N/A   Patient Active Problem List  Diagnoses  . DYSLIPIDEMIA  . VALVULAR HEART DISEASE  . CHRONIC SYSTOLIC HEART FAILURE  . CAROTID ARTERY DISEASE  . COPD  . NEPHROLITHIASIS  . CAROTID BRUIT  . RENAL CALCULUS, HX OF  . Ischemic cardiomyopathy-ejection fraction normalized to 55%   . LV dysfunction  . Ventricular tachycardia  . Drug-induced gynecomastia  . Carotid artery disease-less than 50% stenosis bilaterally   . Aortic insufficiency-moderate   . Dyslipidemia  . Pulmonary embolism  . ICD (implantable cardiac defibrillator), biventricular, in situ    PLAN   The patient is doing well from a cardiac perspective. Is tolerating his medical regimen well.  Blood work is done by his primary care physician.  The patient reported no countershocks. He has not required any hospitalizations for heart failure.  I made no changes in his medical regimen.  We will see the patient back in one year.

## 2011-12-13 NOTE — Patient Instructions (Signed)
Your physician recommends that you schedule a follow-up appointment in: 1 year. You will receive a letter in the mail  about 1-2 months in advance reminding you to call and schedule your appointment. If you don't receive this letter, please contact our office.  Your physician recommends that you continue on your current medications as directed. Please refer to the Current Medication list given to you today.

## 2012-02-16 ENCOUNTER — Ambulatory Visit (INDEPENDENT_AMBULATORY_CARE_PROVIDER_SITE_OTHER): Payer: Medicare Other | Admitting: *Deleted

## 2012-02-16 ENCOUNTER — Encounter: Payer: Self-pay | Admitting: Internal Medicine

## 2012-02-16 DIAGNOSIS — I2589 Other forms of chronic ischemic heart disease: Secondary | ICD-10-CM

## 2012-02-16 DIAGNOSIS — I472 Ventricular tachycardia, unspecified: Secondary | ICD-10-CM

## 2012-02-16 DIAGNOSIS — I509 Heart failure, unspecified: Secondary | ICD-10-CM

## 2012-02-16 DIAGNOSIS — I255 Ischemic cardiomyopathy: Secondary | ICD-10-CM

## 2012-02-21 LAB — REMOTE ICD DEVICE
AL IMPEDENCE ICD: 551 Ohm
ATRIAL PACING ICD: 78.27 pct
BAMS-0001: 170 {beats}/min
CHARGE TIME: 9.028 s
PACEART VT: 0
RV LEAD AMPLITUDE: 16.1 mv
TOT-0001: 1
TOT-0002: 0
TZAT-0001ATACH: 1
TZAT-0001ATACH: 3
TZAT-0001FASTVT: 1
TZAT-0002ATACH: NEGATIVE
TZAT-0004SLOWVT: 8
TZAT-0011SLOWVT: 10 ms
TZAT-0011SLOWVT: 10 ms
TZAT-0012ATACH: 150 ms
TZAT-0012ATACH: 150 ms
TZAT-0012ATACH: 150 ms
TZAT-0012SLOWVT: 200 ms
TZAT-0012SLOWVT: 200 ms
TZAT-0013SLOWVT: 2
TZAT-0013SLOWVT: 2
TZAT-0018ATACH: NEGATIVE
TZAT-0019ATACH: 6 V
TZAT-0019SLOWVT: 8 V
TZAT-0019SLOWVT: 8 V
TZAT-0020ATACH: 1.5 ms
TZAT-0020ATACH: 1.5 ms
TZAT-0020SLOWVT: 1.5 ms
TZAT-0020SLOWVT: 1.5 ms
TZON-0003ATACH: 350 ms
TZON-0003SLOWVT: 350 ms
TZON-0003VSLOWVT: 450 ms
TZON-0004SLOWVT: 20
TZST-0001ATACH: 4
TZST-0001ATACH: 6
TZST-0001FASTVT: 2
TZST-0001FASTVT: 3
TZST-0001FASTVT: 4
TZST-0001SLOWVT: 4
TZST-0001SLOWVT: 5
TZST-0002ATACH: NEGATIVE
TZST-0002ATACH: NEGATIVE
TZST-0002FASTVT: NEGATIVE
TZST-0002FASTVT: NEGATIVE
TZST-0003SLOWVT: 25 J
TZST-0003SLOWVT: 35 J
TZST-0003SLOWVT: 35 J

## 2012-02-24 NOTE — Progress Notes (Signed)
ICD remote with ICM 

## 2012-03-02 ENCOUNTER — Encounter: Payer: Self-pay | Admitting: *Deleted

## 2012-05-22 ENCOUNTER — Encounter: Payer: Self-pay | Admitting: Internal Medicine

## 2012-05-22 ENCOUNTER — Ambulatory Visit (INDEPENDENT_AMBULATORY_CARE_PROVIDER_SITE_OTHER): Payer: Medicare Other | Admitting: Internal Medicine

## 2012-05-22 VITALS — BP 140/82 | HR 64 | Ht 69.0 in | Wt 136.1 lb

## 2012-05-22 DIAGNOSIS — Z95810 Presence of automatic (implantable) cardiac defibrillator: Secondary | ICD-10-CM

## 2012-05-22 DIAGNOSIS — I509 Heart failure, unspecified: Secondary | ICD-10-CM

## 2012-05-22 DIAGNOSIS — I5022 Chronic systolic (congestive) heart failure: Secondary | ICD-10-CM | POA: Insufficient documentation

## 2012-05-22 DIAGNOSIS — I472 Ventricular tachycardia: Secondary | ICD-10-CM

## 2012-05-22 DIAGNOSIS — I255 Ischemic cardiomyopathy: Secondary | ICD-10-CM

## 2012-05-22 DIAGNOSIS — I2589 Other forms of chronic ischemic heart disease: Secondary | ICD-10-CM

## 2012-05-22 LAB — ICD DEVICE OBSERVATION
AL IMPEDENCE ICD: 570 Ohm
AL THRESHOLD: 0.5 V
ATRIAL PACING ICD: 73.57 pct
BAMS-0001: 170 {beats}/min
BATTERY VOLTAGE: 3.1415 V
PACEART VT: 0
RV LEAD THRESHOLD: 0.875 V
TOT-0002: 0
TZAT-0001ATACH: 1
TZAT-0001ATACH: 2
TZAT-0001SLOWVT: 2
TZAT-0002ATACH: NEGATIVE
TZAT-0004SLOWVT: 8
TZAT-0004SLOWVT: 8
TZAT-0005SLOWVT: 84 pct
TZAT-0005SLOWVT: 91 pct
TZAT-0011SLOWVT: 10 ms
TZAT-0011SLOWVT: 10 ms
TZAT-0012ATACH: 150 ms
TZAT-0012SLOWVT: 200 ms
TZAT-0013SLOWVT: 2
TZAT-0013SLOWVT: 2
TZAT-0018ATACH: NEGATIVE
TZAT-0018ATACH: NEGATIVE
TZAT-0018FASTVT: NEGATIVE
TZAT-0019ATACH: 6 V
TZAT-0019FASTVT: 8 V
TZON-0003ATACH: 350 ms
TZON-0003SLOWVT: 350 ms
TZON-0004SLOWVT: 20
TZON-0004VSLOWVT: 20
TZST-0001ATACH: 4
TZST-0001FASTVT: 2
TZST-0001FASTVT: 3
TZST-0001FASTVT: 6
TZST-0001SLOWVT: 3
TZST-0001SLOWVT: 6
TZST-0002ATACH: NEGATIVE
TZST-0002FASTVT: NEGATIVE
TZST-0002FASTVT: NEGATIVE
TZST-0003SLOWVT: 35 J
TZST-0003SLOWVT: 35 J
VENTRICULAR PACING ICD: 99.94 pct
VF: 0

## 2012-05-22 NOTE — Patient Instructions (Addendum)
Your physician wants you to follow-up in: 12 months with Dr Jacquiline Doe will receive a reminder letter in the mail two months in advance. If you don't receive a letter, please call our office to schedule the follow-up appointment.  Remote monitoring is used to monitor your Pacemaker of ICD from home. This monitoring reduces the number of office visits required to check your device to one time per year. It allows [place] to keep an eye on the functioning of your device to ensure it is working properly. You are scheduled for a device check from home on 08/30/12. You may send your transmission at any time that day. If you have a wireless device, the transmission will be sent automatically. After your physician reviews your transmission, you will receive a postcard with your next transmission date.

## 2012-05-22 NOTE — Assessment & Plan Note (Signed)
No recurrent ventricular arrhythmias in the interim. Will recheck in several months.

## 2012-05-22 NOTE — Assessment & Plan Note (Signed)
His device is working normally. Will recheck in several months. 

## 2012-05-22 NOTE — Assessment & Plan Note (Signed)
He denies anginal symptoms. Will continue his current medical therapy.

## 2012-05-22 NOTE — Progress Notes (Signed)
HPI [name] returns today for followup. He is a pleasant 71 yo man with an ICM, chronic systolic CHF, and HTN. He denies chest pain or peripheral edema. He notes that some days his dyspnea is better than others. No Known Allergies   Current Outpatient Prescriptions  Medication Sig Dispense Refill  . aspirin 81 MG tablet Take 81 mg by mouth daily. 2 po daily       . carvedilol (COREG) 25 MG tablet Take 1 tablet (25 mg total) by mouth 2 (two) times daily with a meal.  180 tablet  3  . digoxin (LANOXIN) 0.25 MG tablet Take 1 tablet (250 mcg total) by mouth daily.  90 tablet  3  . eplerenone (INSPRA) 25 MG tablet Take 1 tablet (25 mg total) by mouth daily.  90 tablet  3  . fish oil-omega-3 fatty acids 1000 MG capsule Take 2 g by mouth daily.        . furosemide (LASIX) 20 MG tablet Take 1 tablet (20 mg total) by mouth daily.  90 tablet  3  . levothyroxine (SYNTHROID, LEVOTHROID) 150 MCG tablet Take 125 mcg by mouth daily.       . niacin 500 MG tablet Take 500 mg by mouth daily with breakfast.        . pantoprazole (PROTONIX) 40 MG tablet Take 1 tablet by mouth Daily.      . simvastatin (ZOCOR) 20 MG tablet Take 1 tablet (20 mg total) by mouth at bedtime.  90 tablet  3  . trandolapril (MAVIK) 2 MG tablet Take 1.5 tablets (3 mg total) by mouth daily.  90 tablet  3     Past Medical History  Diagnosis Date  . Ischemic cardiomyopathy      status post non-ST elevation microinfarction stent to the right coronary artery is a   . LV dysfunction      NYHA class I/prior ejection fraction 20-25% and an ejection fraction 7 2009 50-55%, ejection fraction 60% bedside echocardiogram July 2012   . Ventricular tachycardia     Inducible ventricular polymorphic tachycardia status post ICD followed by upgrade with CRT-D 2007, battery change at 01/31/2011  . Drug-induced gynecomastia      secondary to spironolactone  . Carotid artery disease      less than 50% stenosis bilaterally  . Aortic insufficiency        moderate aortic ins moderate/asymptomatic/normal LV cavity size.   Marland Kitchen Dyslipidemia   . Pulmonary embolism     Prior history of pulmonary embolism off Coumadin.  . ICD (implantable cardiac defibrillator), biventricular, in situ      Medtronic model  D314TRG    ROS:   All systems reviewed and negative except as noted in the HPI.   Past Surgical History  Procedure Date  . Thoracotomy     left  . Coronary stent placement   . Retropubic prostatectomy   . Inguinal hernia repair   . Cardiac defibrillator placement     medtronic, remote - yes  . Left breast biopsy      Family History  Problem Relation Age of Onset  . Diabetes type I Sister     daughter   . Clotting disorder Neg Hx     also no repiratory disease   . Prostate cancer Neg Hx      History   Social History  . Marital Status: Married    Spouse Name: N/A    Number of Children: 2  . Years of  Education: N/A   Occupational History  . plumber - self employed    Social History Main Topics  . Smoking status: Former Games developer  . Smokeless tobacco: Not on file  . Alcohol Use: No  . Drug Use: No  . Sexually Active: Not on file   Other Topics Concern  . Not on file   Social History Narrative   Self employed Nutritional therapist; 2 daughters.      BP 140/82  Pulse 64  Ht 5\' 9"  (1.753 m)  Wt 136 lb 1.9 oz (61.744 kg)  BMI 20.10 kg/m2  Physical Exam:  Well appearing 71 yo man, NAD HEENT: Unremarkable Neck:  No JVD, no thyromegally Lungs:  Clear with no wheezes HEART:  Regular rate rhythm, no murmurs, no rubs, no clicks Abd:  soft, positive bowel sounds, no organomegally, no rebound, no guarding Ext:  2 plus pulses, no edema, no cyanosis, no clubbing Skin:  No rashes no nodules Neuro:  CN II through XII intact, motor grossly intact   DEVICE  Normal device function.  See PaceArt for details.   Assess/Plan:

## 2012-08-02 ENCOUNTER — Telehealth (INDEPENDENT_AMBULATORY_CARE_PROVIDER_SITE_OTHER): Payer: Self-pay | Admitting: *Deleted

## 2012-08-02 NOTE — Telephone Encounter (Signed)
[facility] is requesting a refill on Pantoprazole SOD DR 40 mg. Take 1 tablet by mouth twice daily. Before Breakfast and evening meal.

## 2012-08-27 ENCOUNTER — Ambulatory Visit (INDEPENDENT_AMBULATORY_CARE_PROVIDER_SITE_OTHER): Payer: Medicare Other | Admitting: *Deleted

## 2012-08-27 ENCOUNTER — Encounter: Payer: Self-pay | Admitting: Internal Medicine

## 2012-08-27 DIAGNOSIS — Z95810 Presence of automatic (implantable) cardiac defibrillator: Secondary | ICD-10-CM

## 2012-08-27 DIAGNOSIS — I509 Heart failure, unspecified: Secondary | ICD-10-CM

## 2012-08-27 DIAGNOSIS — I255 Ischemic cardiomyopathy: Secondary | ICD-10-CM

## 2012-08-27 DIAGNOSIS — I2589 Other forms of chronic ischemic heart disease: Secondary | ICD-10-CM

## 2012-08-31 LAB — REMOTE ICD DEVICE
AL THRESHOLD: 0.625 V
BATTERY VOLTAGE: 3.1172 V
LV LEAD IMPEDENCE ICD: 437 Ohm
PACEART VT: 0
RV LEAD AMPLITUDE: 9.3 mv
RV LEAD THRESHOLD: 1.125 V
TOT-0006: 20120416000000
TZAT-0002ATACH: NEGATIVE
TZAT-0002FASTVT: NEGATIVE
TZAT-0011SLOWVT: 10 ms
TZAT-0011SLOWVT: 10 ms
TZAT-0012ATACH: 150 ms
TZAT-0012FASTVT: 200 ms
TZAT-0012SLOWVT: 200 ms
TZAT-0012SLOWVT: 200 ms
TZAT-0013SLOWVT: 2
TZAT-0013SLOWVT: 2
TZAT-0018ATACH: NEGATIVE
TZAT-0018FASTVT: NEGATIVE
TZAT-0018SLOWVT: NEGATIVE
TZAT-0018SLOWVT: NEGATIVE
TZAT-0019ATACH: 6 V
TZAT-0019ATACH: 6 V
TZAT-0019SLOWVT: 8 V
TZAT-0019SLOWVT: 8 V
TZAT-0020ATACH: 1.5 ms
TZAT-0020ATACH: 1.5 ms
TZAT-0020FASTVT: 1.5 ms
TZON-0003ATACH: 350 ms
TZON-0003SLOWVT: 350 ms
TZON-0004SLOWVT: 20
TZON-0005SLOWVT: 12
TZST-0001ATACH: 4
TZST-0001ATACH: 5
TZST-0001FASTVT: 2
TZST-0001FASTVT: 3
TZST-0001SLOWVT: 4
TZST-0002ATACH: NEGATIVE
TZST-0002FASTVT: NEGATIVE
TZST-0003SLOWVT: 25 J
TZST-0003SLOWVT: 35 J
VENTRICULAR PACING ICD: 99.96 pct
VF: 0

## 2012-09-04 ENCOUNTER — Encounter: Payer: Self-pay | Admitting: *Deleted

## 2012-09-14 DIAGNOSIS — I472 Ventricular tachycardia: Secondary | ICD-10-CM | POA: Insufficient documentation

## 2012-09-14 DIAGNOSIS — I4729 Other ventricular tachycardia: Secondary | ICD-10-CM | POA: Insufficient documentation

## 2012-09-14 DIAGNOSIS — I739 Peripheral vascular disease, unspecified: Secondary | ICD-10-CM

## 2012-09-14 DIAGNOSIS — Z9861 Coronary angioplasty status: Secondary | ICD-10-CM | POA: Insufficient documentation

## 2012-12-03 ENCOUNTER — Ambulatory Visit (INDEPENDENT_AMBULATORY_CARE_PROVIDER_SITE_OTHER): Payer: Medicare Other | Admitting: *Deleted

## 2012-12-03 ENCOUNTER — Other Ambulatory Visit: Payer: Self-pay | Admitting: Internal Medicine

## 2012-12-03 ENCOUNTER — Encounter: Payer: Self-pay | Admitting: Internal Medicine

## 2012-12-03 DIAGNOSIS — I2589 Other forms of chronic ischemic heart disease: Secondary | ICD-10-CM

## 2012-12-03 DIAGNOSIS — Z95810 Presence of automatic (implantable) cardiac defibrillator: Secondary | ICD-10-CM

## 2012-12-03 DIAGNOSIS — I509 Heart failure, unspecified: Secondary | ICD-10-CM

## 2012-12-03 DIAGNOSIS — I255 Ischemic cardiomyopathy: Secondary | ICD-10-CM

## 2012-12-12 LAB — REMOTE ICD DEVICE
ATRIAL PACING ICD: 87.95 pct
BAMS-0001: 170 {beats}/min
CHARGE TIME: 9.389 s
FVT: 0
LV LEAD THRESHOLD: 1.375 V
PACEART VT: 0
RV LEAD AMPLITUDE: 14.9 mv
RV LEAD THRESHOLD: 1 V
TOT-0001: 1
TZAT-0002ATACH: NEGATIVE
TZAT-0004SLOWVT: 8
TZAT-0004SLOWVT: 8
TZAT-0005SLOWVT: 84 pct
TZAT-0011SLOWVT: 10 ms
TZAT-0011SLOWVT: 10 ms
TZAT-0012ATACH: 150 ms
TZAT-0012ATACH: 150 ms
TZAT-0012FASTVT: 200 ms
TZAT-0012SLOWVT: 200 ms
TZAT-0012SLOWVT: 200 ms
TZAT-0018ATACH: NEGATIVE
TZAT-0019ATACH: 6 V
TZAT-0019ATACH: 6 V
TZAT-0019FASTVT: 8 V
TZAT-0019SLOWVT: 8 V
TZAT-0020ATACH: 1.5 ms
TZAT-0020ATACH: 1.5 ms
TZAT-0020FASTVT: 1.5 ms
TZON-0003ATACH: 350 ms
TZON-0003SLOWVT: 350 ms
TZON-0003VSLOWVT: 450 ms
TZST-0001ATACH: 4
TZST-0001FASTVT: 2
TZST-0001FASTVT: 5
TZST-0001FASTVT: 6
TZST-0001SLOWVT: 4
TZST-0001SLOWVT: 5
TZST-0002FASTVT: NEGATIVE
TZST-0002FASTVT: NEGATIVE
TZST-0002FASTVT: NEGATIVE
TZST-0003SLOWVT: 25 J
TZST-0003SLOWVT: 35 J
TZST-0003SLOWVT: 35 J
VF: 0

## 2012-12-26 ENCOUNTER — Encounter: Payer: Self-pay | Admitting: *Deleted

## 2013-03-08 ENCOUNTER — Encounter: Payer: Medicare Other | Admitting: Cardiology

## 2013-03-12 ENCOUNTER — Encounter: Payer: Self-pay | Admitting: Internal Medicine

## 2013-03-12 ENCOUNTER — Ambulatory Visit (INDEPENDENT_AMBULATORY_CARE_PROVIDER_SITE_OTHER): Payer: Medicare Other | Admitting: *Deleted

## 2013-03-12 DIAGNOSIS — I509 Heart failure, unspecified: Secondary | ICD-10-CM

## 2013-03-12 DIAGNOSIS — I255 Ischemic cardiomyopathy: Secondary | ICD-10-CM

## 2013-03-12 DIAGNOSIS — Z95810 Presence of automatic (implantable) cardiac defibrillator: Secondary | ICD-10-CM

## 2013-03-12 DIAGNOSIS — I2589 Other forms of chronic ischemic heart disease: Secondary | ICD-10-CM

## 2013-03-17 LAB — REMOTE ICD DEVICE
AL AMPLITUDE: 4.5 mv
AL THRESHOLD: 0.5 V
BATTERY VOLTAGE: 3.0912 V
CHARGE TIME: 9.799 s
FVT: 0
LV LEAD THRESHOLD: 1.375 V
RV LEAD AMPLITUDE: 9.4 mv
RV LEAD IMPEDENCE ICD: 608 Ohm
TOT-0001: 1
TZAT-0001ATACH: 3
TZAT-0001FASTVT: 1
TZAT-0001SLOWVT: 2
TZAT-0002ATACH: NEGATIVE
TZAT-0012ATACH: 150 ms
TZAT-0012FASTVT: 200 ms
TZAT-0018ATACH: NEGATIVE
TZAT-0018FASTVT: NEGATIVE
TZAT-0019ATACH: 6 V
TZAT-0019SLOWVT: 8 V
TZAT-0019SLOWVT: 8 V
TZAT-0020ATACH: 1.5 ms
TZAT-0020ATACH: 1.5 ms
TZAT-0020FASTVT: 1.5 ms
TZAT-0020SLOWVT: 1.5 ms
TZAT-0020SLOWVT: 1.5 ms
TZON-0003VSLOWVT: 450 ms
TZST-0001ATACH: 6
TZST-0001FASTVT: 4
TZST-0001FASTVT: 5
TZST-0001SLOWVT: 5
TZST-0002ATACH: NEGATIVE
TZST-0002ATACH: NEGATIVE
TZST-0002FASTVT: NEGATIVE
TZST-0002FASTVT: NEGATIVE
TZST-0003SLOWVT: 25 J
TZST-0003SLOWVT: 35 J
TZST-0003SLOWVT: 35 J
VF: 0

## 2013-03-19 ENCOUNTER — Telehealth: Payer: Self-pay | Admitting: Internal Medicine

## 2013-03-19 NOTE — Telephone Encounter (Signed)
New Prob    Calling in with some questions regarding canceled appt. Would like to know if primary cardiologist is necessary or if Dr. Ladona Ridgel will be taking care of all his care. Please call,.

## 2013-03-20 NOTE — Telephone Encounter (Signed)
Will change to Dr Ladona Ridgel in [facility] on Aug 12  He is a Chief Financial Officer patient. It was probably my mistake on AVS

## 2013-03-21 NOTE — Telephone Encounter (Signed)
Went to reschedule and 8/12 looks bad will put on 06/10/13.  Went to schedule and Aurther Loft in the [facility] office had already scheduled

## 2013-04-17 ENCOUNTER — Ambulatory Visit: Payer: Medicare Other | Admitting: Cardiovascular Disease

## 2013-04-26 ENCOUNTER — Encounter: Payer: Self-pay | Admitting: *Deleted

## 2013-05-13 ENCOUNTER — Ambulatory Visit: Payer: Medicare Other | Admitting: Cardiovascular Disease

## 2013-05-20 ENCOUNTER — Encounter: Payer: Medicare Other | Admitting: Internal Medicine

## 2013-06-10 ENCOUNTER — Encounter: Payer: Self-pay | Admitting: Internal Medicine

## 2013-06-10 ENCOUNTER — Ambulatory Visit (INDEPENDENT_AMBULATORY_CARE_PROVIDER_SITE_OTHER): Payer: Medicare Other | Admitting: Internal Medicine

## 2013-06-10 VITALS — BP 104/58 | HR 62 | Ht 68.0 in | Wt 141.0 lb

## 2013-06-10 DIAGNOSIS — I519 Heart disease, unspecified: Secondary | ICD-10-CM

## 2013-06-10 DIAGNOSIS — I255 Ischemic cardiomyopathy: Secondary | ICD-10-CM

## 2013-06-10 DIAGNOSIS — I2699 Other pulmonary embolism without acute cor pulmonale: Secondary | ICD-10-CM

## 2013-06-10 DIAGNOSIS — I359 Nonrheumatic aortic valve disorder, unspecified: Secondary | ICD-10-CM

## 2013-06-10 DIAGNOSIS — I509 Heart failure, unspecified: Secondary | ICD-10-CM

## 2013-06-10 DIAGNOSIS — I779 Disorder of arteries and arterioles, unspecified: Secondary | ICD-10-CM

## 2013-06-10 DIAGNOSIS — I2589 Other forms of chronic ischemic heart disease: Secondary | ICD-10-CM

## 2013-06-10 DIAGNOSIS — I351 Nonrheumatic aortic (valve) insufficiency: Secondary | ICD-10-CM

## 2013-06-10 DIAGNOSIS — I472 Ventricular tachycardia: Secondary | ICD-10-CM

## 2013-06-10 DIAGNOSIS — Z95810 Presence of automatic (implantable) cardiac defibrillator: Secondary | ICD-10-CM

## 2013-06-10 LAB — ICD DEVICE OBSERVATION
AL IMPEDENCE ICD: 551 Ohm
AL THRESHOLD: 0.5 V
BAMS-0001: 170 {beats}/min
BATTERY VOLTAGE: 3.0746 V
CHARGE TIME: 9.799 s
FVT: 0
LV LEAD THRESHOLD: 1.375 V
PACEART VT: 0
RV LEAD AMPLITUDE: 8.25 mv
TOT-0001: 1
TOT-0002: 0
TZAT-0001ATACH: 1
TZAT-0001ATACH: 2
TZAT-0001FASTVT: 1
TZAT-0004SLOWVT: 8
TZAT-0004SLOWVT: 8
TZAT-0005SLOWVT: 84 pct
TZAT-0005SLOWVT: 91 pct
TZAT-0011SLOWVT: 10 ms
TZAT-0012ATACH: 150 ms
TZAT-0012ATACH: 150 ms
TZAT-0012FASTVT: 200 ms
TZAT-0012SLOWVT: 200 ms
TZAT-0012SLOWVT: 200 ms
TZAT-0013SLOWVT: 2
TZAT-0013SLOWVT: 2
TZAT-0018ATACH: NEGATIVE
TZAT-0018FASTVT: NEGATIVE
TZAT-0019ATACH: 6 V
TZAT-0019FASTVT: 8 V
TZAT-0020ATACH: 1.5 ms
TZAT-0020ATACH: 1.5 ms
TZAT-0020FASTVT: 1.5 ms
TZAT-0020SLOWVT: 1.5 ms
TZAT-0020SLOWVT: 1.5 ms
TZON-0003ATACH: 350 ms
TZON-0003SLOWVT: 350 ms
TZON-0003VSLOWVT: 450 ms
TZON-0004SLOWVT: 20
TZON-0004VSLOWVT: 20
TZST-0001ATACH: 4
TZST-0001ATACH: 6
TZST-0001FASTVT: 2
TZST-0001FASTVT: 4
TZST-0001FASTVT: 6
TZST-0001SLOWVT: 5
TZST-0001SLOWVT: 6
TZST-0002FASTVT: NEGATIVE
TZST-0002FASTVT: NEGATIVE
TZST-0002FASTVT: NEGATIVE
TZST-0003SLOWVT: 25 J
TZST-0003SLOWVT: 35 J
VENTRICULAR PACING ICD: 99.97 pct

## 2013-06-10 NOTE — Assessment & Plan Note (Signed)
He has had no sustained ventricular arrhythmias. No change in medical therapy.

## 2013-06-10 NOTE — Patient Instructions (Signed)
Your physician recommends that you schedule a follow-up appointment in: 1 year with Ladona Ridgel

## 2013-06-10 NOTE — Assessment & Plan Note (Signed)
His device is working normally. Will recheck in several months. 

## 2013-06-10 NOTE — Progress Notes (Signed)
HPI [name] returns today for followup. He is a pleasant 72 yo man with a h/o an ICM, chronic systolic CHF, s/p BiV ICD implant. He has done well in the interim. He denies chest pain or sob. His wife states that his appetite is down but he has not gained or lost weight. No edema. No ICD shock. No Known Allergies   Current Outpatient Prescriptions  Medication Sig Dispense Refill  . aspirin 81 MG tablet Take 81 mg by mouth daily. 2 po daily       . carvedilol (COREG) 25 MG tablet Take 1 tablet (25 mg total) by mouth 2 (two) times daily with a meal.  180 tablet  3  . digoxin (LANOXIN) 0.25 MG tablet Take 1 tablet (250 mcg total) by mouth daily.  90 tablet  3  . eplerenone (INSPRA) 25 MG tablet Take 1 tablet (25 mg total) by mouth daily.  90 tablet  3  . furosemide (LASIX) 20 MG tablet Take 1 tablet (20 mg total) by mouth daily.  90 tablet  3  . levothyroxine (SYNTHROID, LEVOTHROID) 112 MCG tablet Take 112 mcg by mouth daily before breakfast.      . niacin 500 MG tablet Take 500 mg by mouth daily with breakfast.        . simvastatin (ZOCOR) 20 MG tablet Take 1 tablet (20 mg total) by mouth at bedtime.  90 tablet  3  . trandolapril (MAVIK) 2 MG tablet Take 1.5 tablets (3 mg total) by mouth daily.  90 tablet  3  . pantoprazole (PROTONIX) 40 MG tablet Take 1 tablet by mouth Daily.       No current facility-administered medications for this visit.     Past Medical History  Diagnosis Date  . Ischemic cardiomyopathy      status post non-ST elevation microinfarction stent to the right coronary artery is a   . LV dysfunction      NYHA class I/prior ejection fraction 20-25% and an ejection fraction 7 2009 50-55%, ejection fraction 60% bedside echocardiogram July 2012   . Ventricular tachycardia     Inducible ventricular polymorphic tachycardia status post ICD followed by upgrade with CRT-D 2007, battery change at 01/31/2011  . Drug-induced gynecomastia      secondary to spironolactone  .  Carotid artery disease      less than 50% stenosis bilaterally  . Aortic insufficiency      moderate aortic ins moderate/asymptomatic/normal LV cavity size.   Marland Kitchen Dyslipidemia   . Pulmonary embolism     Prior history of pulmonary embolism off Coumadin.  . ICD (implantable cardiac defibrillator), biventricular, in situ      Medtronic model  D314TRG    ROS:   All systems reviewed and negative except as noted in the HPI.   Past Surgical History  Procedure Laterality Date  . Thoracotomy      left  . Coronary stent placement    . Retropubic prostatectomy    . Inguinal hernia repair    . Cardiac defibrillator placement      medtronic, remote - yes  . Left breast biopsy       Family History  Problem Relation Age of Onset  . Diabetes type I Sister     daughter   . Clotting disorder Neg Hx     also no repiratory disease   . Prostate cancer Neg Hx      History   Social History  . Marital Status: Married  Spouse Name: N/A    Number of Children: 2  . Years of Education: N/A   Occupational History  . plumber - self employed    Social History Main Topics  . Smoking status: Former Games developer  . Smokeless tobacco: Not on file  . Alcohol Use: No  . Drug Use: No  . Sexual Activity: Not on file   Other Topics Concern  . Not on file   Social History Narrative   Self employed Nutritional therapist; 2 daughters.      BP 104/58  Pulse 62  Ht 5\' 8"  (1.727 m)  Wt 141 lb (63.957 kg)  BMI 21.44 kg/m2  SpO2 92%  Physical Exam:  Well appearing 72 yo man, NAD HEENT: Unremarkable Neck:  7 cm JVD, no thyromegally Back:  No CVA tenderness Lungs:  Clear with no wheezes, well healed ICD incision. HEART:  Regular rate rhythm, no murmurs, no rubs, no clicks Abd:  soft, positive bowel sounds, no organomegally, no rebound, no guarding Ext:  2 plus pulses, no edema, no cyanosis, no clubbing Skin:  No rashes no nodules Neuro:  CN II through XII intact, motor grossly intact  ECG - AV seq.  pacing  DEVICE  Normal device function.  See PaceArt for details.   Assess/Plan:

## 2013-06-10 NOTE — Assessment & Plan Note (Signed)
He denies anginal symptoms. No change in medical therapy. I have encouraged him to increase his physical activity. 

## 2013-09-16 ENCOUNTER — Encounter: Payer: Self-pay | Admitting: Internal Medicine

## 2013-09-16 ENCOUNTER — Ambulatory Visit (INDEPENDENT_AMBULATORY_CARE_PROVIDER_SITE_OTHER): Payer: Medicare Other | Admitting: *Deleted

## 2013-09-16 DIAGNOSIS — I2589 Other forms of chronic ischemic heart disease: Secondary | ICD-10-CM

## 2013-09-16 DIAGNOSIS — I472 Ventricular tachycardia: Secondary | ICD-10-CM

## 2013-09-16 DIAGNOSIS — I255 Ischemic cardiomyopathy: Secondary | ICD-10-CM

## 2013-09-16 DIAGNOSIS — I509 Heart failure, unspecified: Secondary | ICD-10-CM

## 2013-09-23 ENCOUNTER — Telehealth: Payer: Self-pay | Admitting: *Deleted

## 2013-09-23 NOTE — Telephone Encounter (Signed)
Pt's spouse says pt has had neck cramps and cramping behind his ICD. I had them send an extra transmission---no episodes/arrhythmias of any type. All other functions/diagnostics normal & consistent.

## 2013-09-30 LAB — MDC_IDC_ENUM_SESS_TYPE_REMOTE
Battery Voltage: 3.06 V
Brady Statistic AS VS Percent: 0.1 %
Lead Channel Pacing Threshold Amplitude: 0.625 V
Lead Channel Pacing Threshold Amplitude: 1.25 V
Lead Channel Pacing Threshold Pulse Width: 0.4 ms
Lead Channel Setting Pacing Amplitude: 2 V
Lead Channel Setting Pacing Amplitude: 2 V
Lead Channel Setting Sensing Sensitivity: 0.45 mV
Zone Setting Detection Interval: 450 ms

## 2013-10-14 ENCOUNTER — Encounter: Payer: Self-pay | Admitting: *Deleted

## 2013-12-09 ENCOUNTER — Telehealth: Payer: Self-pay | Admitting: Internal Medicine

## 2013-12-09 NOTE — Telephone Encounter (Signed)
New message     Ins company want to cut his digoxin in half to 0.125mg ---will this be OK with the doctor

## 2013-12-09 NOTE — Telephone Encounter (Signed)
Spoke with patient's wife.  Let her know Dr Lovena Le is working in the hospital today

## 2013-12-10 NOTE — Telephone Encounter (Signed)
Left message to call back - needs dig level then Dr Lovena Le will decide on medication change based on these results

## 2013-12-11 NOTE — Telephone Encounter (Signed)
Follow up ° ° ° ° °Returning a nurses call °

## 2013-12-18 ENCOUNTER — Ambulatory Visit (INDEPENDENT_AMBULATORY_CARE_PROVIDER_SITE_OTHER): Payer: Medicare Other | Admitting: *Deleted

## 2013-12-18 DIAGNOSIS — I509 Heart failure, unspecified: Secondary | ICD-10-CM

## 2013-12-18 DIAGNOSIS — I4729 Other ventricular tachycardia: Secondary | ICD-10-CM

## 2013-12-18 DIAGNOSIS — I472 Ventricular tachycardia, unspecified: Secondary | ICD-10-CM

## 2013-12-18 NOTE — Telephone Encounter (Signed)
Follow up  ° ° ° °Returning call back to nurse  °

## 2013-12-18 NOTE — Telephone Encounter (Signed)
lmom for patient's wife to return my call in regards to labs needed prior to making decision about his Digoxin

## 2013-12-19 NOTE — Telephone Encounter (Signed)
Follow up     [place] approved digoxin.  No need to call pt back

## 2013-12-22 LAB — MDC_IDC_ENUM_SESS_TYPE_REMOTE
Battery Voltage: 3.05 V
Brady Statistic AP VP Percent: 49.27 %
Brady Statistic RV Percent Paced: 99.95 %
HIGH POWER IMPEDANCE MEASURED VALUE: 513 Ohm
HIGH POWER IMPEDANCE MEASURED VALUE: 79 Ohm
HighPow Impedance: 342 Ohm
HighPow Impedance: 58 Ohm
Lead Channel Impedance Value: 570 Ohm
Lead Channel Pacing Threshold Amplitude: 1.25 V
Lead Channel Pacing Threshold Pulse Width: 0.4 ms
Lead Channel Pacing Threshold Pulse Width: 1 ms
Lead Channel Setting Pacing Amplitude: 2 V
Lead Channel Setting Pacing Amplitude: 2.5 V
Lead Channel Setting Pacing Pulse Width: 0.4 ms
Lead Channel Setting Pacing Pulse Width: 1 ms
MDC IDC MSMT LEADCHNL LV IMPEDANCE VALUE: 4047 Ohm
MDC IDC MSMT LEADCHNL LV IMPEDANCE VALUE: 4047 Ohm
MDC IDC MSMT LEADCHNL LV PACING THRESHOLD AMPLITUDE: 1.375 V
MDC IDC MSMT LEADCHNL RA IMPEDANCE VALUE: 551 Ohm
MDC IDC MSMT LEADCHNL RA PACING THRESHOLD AMPLITUDE: 0.5 V
MDC IDC MSMT LEADCHNL RA PACING THRESHOLD PULSEWIDTH: 0.4 ms
MDC IDC MSMT LEADCHNL RA SENSING INTR AMPL: 4.75 mV
MDC IDC MSMT LEADCHNL RV IMPEDANCE VALUE: 703 Ohm
MDC IDC MSMT LEADCHNL RV SENSING INTR AMPL: 15.375 mV
MDC IDC SESS DTM: 20150305004853
MDC IDC SET LEADCHNL RA PACING AMPLITUDE: 2 V
MDC IDC SET LEADCHNL RV SENSING SENSITIVITY: 0.45 mV
MDC IDC SET ZONE DETECTION INTERVAL: 300 ms
MDC IDC SET ZONE DETECTION INTERVAL: 350 ms
MDC IDC STAT BRADY AP VS PERCENT: 0 %
MDC IDC STAT BRADY AS VP PERCENT: 50.68 %
MDC IDC STAT BRADY AS VS PERCENT: 0.05 %
MDC IDC STAT BRADY RA PERCENT PACED: 49.27 %
Zone Setting Detection Interval: 350 ms
Zone Setting Detection Interval: 450 ms

## 2014-01-02 ENCOUNTER — Encounter: Payer: Self-pay | Admitting: *Deleted

## 2014-01-06 ENCOUNTER — Encounter: Payer: Self-pay | Admitting: *Deleted

## 2014-01-13 ENCOUNTER — Encounter: Payer: Self-pay | Admitting: Internal Medicine

## 2014-03-24 ENCOUNTER — Telehealth: Payer: Self-pay | Admitting: Cardiology

## 2014-03-24 ENCOUNTER — Encounter: Payer: Self-pay | Admitting: Internal Medicine

## 2014-03-24 ENCOUNTER — Ambulatory Visit (INDEPENDENT_AMBULATORY_CARE_PROVIDER_SITE_OTHER): Payer: Medicare Other | Admitting: *Deleted

## 2014-03-24 DIAGNOSIS — I509 Heart failure, unspecified: Secondary | ICD-10-CM

## 2014-03-24 DIAGNOSIS — I472 Ventricular tachycardia, unspecified: Secondary | ICD-10-CM

## 2014-03-24 DIAGNOSIS — I4729 Other ventricular tachycardia: Secondary | ICD-10-CM

## 2014-03-24 NOTE — Progress Notes (Signed)
Remote ICD transmission.   

## 2014-03-24 NOTE — Telephone Encounter (Signed)
LMOVM reminding pt to send remote transmission.   

## 2014-03-26 LAB — MDC_IDC_ENUM_SESS_TYPE_REMOTE
Battery Voltage: 3.01 V
Brady Statistic AP VP Percent: 77.02 %
Brady Statistic AS VP Percent: 22.95 %
Brady Statistic RA Percent Paced: 77.04 %
Brady Statistic RV Percent Paced: 99.97 %
Date Time Interrogation Session: 20150608192052
HighPow Impedance: 285 Ohm
HighPow Impedance: 456 Ohm
HighPow Impedance: 54 Ohm
HighPow Impedance: 71 Ohm
Lead Channel Impedance Value: 551 Ohm
Lead Channel Impedance Value: 570 Ohm
Lead Channel Pacing Threshold Amplitude: 1.375 V
Lead Channel Pacing Threshold Pulse Width: 0.4 ms
Lead Channel Sensing Intrinsic Amplitude: 5.5 mV
Lead Channel Sensing Intrinsic Amplitude: 5.5 mV
Lead Channel Setting Pacing Amplitude: 2 V
Lead Channel Setting Pacing Amplitude: 2 V
MDC IDC MSMT LEADCHNL LV IMPEDANCE VALUE: 4047 Ohm
MDC IDC MSMT LEADCHNL LV IMPEDANCE VALUE: 4047 Ohm
MDC IDC MSMT LEADCHNL LV IMPEDANCE VALUE: 570 Ohm
MDC IDC MSMT LEADCHNL LV PACING THRESHOLD AMPLITUDE: 1.375 V
MDC IDC MSMT LEADCHNL LV PACING THRESHOLD PULSEWIDTH: 1 ms
MDC IDC MSMT LEADCHNL RA PACING THRESHOLD AMPLITUDE: 0.625 V
MDC IDC MSMT LEADCHNL RA PACING THRESHOLD PULSEWIDTH: 0.4 ms
MDC IDC MSMT LEADCHNL RV SENSING INTR AMPL: 11.75 mV
MDC IDC MSMT LEADCHNL RV SENSING INTR AMPL: 11.75 mV
MDC IDC SET LEADCHNL LV PACING PULSEWIDTH: 1 ms
MDC IDC SET LEADCHNL RV PACING AMPLITUDE: 2.75 V
MDC IDC SET LEADCHNL RV PACING PULSEWIDTH: 0.4 ms
MDC IDC SET LEADCHNL RV SENSING SENSITIVITY: 0.45 mV
MDC IDC SET ZONE DETECTION INTERVAL: 450 ms
MDC IDC STAT BRADY AP VS PERCENT: 0.02 %
MDC IDC STAT BRADY AS VS PERCENT: 0.01 %
Zone Setting Detection Interval: 300 ms
Zone Setting Detection Interval: 350 ms
Zone Setting Detection Interval: 350 ms

## 2014-04-08 ENCOUNTER — Encounter: Payer: Self-pay | Admitting: Cardiology

## 2014-06-16 ENCOUNTER — Ambulatory Visit (INDEPENDENT_AMBULATORY_CARE_PROVIDER_SITE_OTHER): Payer: Medicare Other | Admitting: Internal Medicine

## 2014-06-16 ENCOUNTER — Encounter: Payer: Self-pay | Admitting: Internal Medicine

## 2014-06-16 VITALS — BP 132/62 | HR 66 | Ht 68.0 in | Wt 142.0 lb

## 2014-06-16 DIAGNOSIS — I2589 Other forms of chronic ischemic heart disease: Secondary | ICD-10-CM

## 2014-06-16 DIAGNOSIS — I255 Ischemic cardiomyopathy: Secondary | ICD-10-CM

## 2014-06-16 DIAGNOSIS — I509 Heart failure, unspecified: Secondary | ICD-10-CM

## 2014-06-16 LAB — MDC_IDC_ENUM_SESS_TYPE_INCLINIC
Battery Voltage: 3 V
Brady Statistic AP VS Percent: 0.02 %
Brady Statistic AS VS Percent: 0.01 %
Brady Statistic RV Percent Paced: 99.97 %
Date Time Interrogation Session: 20150831111558
HIGH POWER IMPEDANCE MEASURED VALUE: 456 Ohm
HIGH POWER IMPEDANCE MEASURED VALUE: 53 Ohm
HIGH POWER IMPEDANCE MEASURED VALUE: 67 Ohm
HighPow Impedance: 285 Ohm
Lead Channel Impedance Value: 4047 Ohm
Lead Channel Impedance Value: 4047 Ohm
Lead Channel Impedance Value: 513 Ohm
Lead Channel Impedance Value: 570 Ohm
Lead Channel Pacing Threshold Amplitude: 1.25 V
Lead Channel Pacing Threshold Amplitude: 1.5 V
Lead Channel Pacing Threshold Pulse Width: 0.4 ms
Lead Channel Pacing Threshold Pulse Width: 0.4 ms
Lead Channel Pacing Threshold Pulse Width: 1 ms
Lead Channel Sensing Intrinsic Amplitude: 15.875 mV
Lead Channel Sensing Intrinsic Amplitude: 8.375 mV
Lead Channel Setting Pacing Amplitude: 2 V
Lead Channel Setting Pacing Amplitude: 2.5 V
Lead Channel Setting Pacing Pulse Width: 0.4 ms
Lead Channel Setting Pacing Pulse Width: 1 ms
Lead Channel Setting Sensing Sensitivity: 0.45 mV
MDC IDC MSMT LEADCHNL RA PACING THRESHOLD AMPLITUDE: 0.5 V
MDC IDC MSMT LEADCHNL RA SENSING INTR AMPL: 4.5 mV
MDC IDC MSMT LEADCHNL RA SENSING INTR AMPL: 4.75 mV
MDC IDC MSMT LEADCHNL RV IMPEDANCE VALUE: 570 Ohm
MDC IDC SET LEADCHNL LV PACING AMPLITUDE: 2 V
MDC IDC SET ZONE DETECTION INTERVAL: 300 ms
MDC IDC SET ZONE DETECTION INTERVAL: 350 ms
MDC IDC SET ZONE DETECTION INTERVAL: 350 ms
MDC IDC STAT BRADY AP VP PERCENT: 80.68 %
MDC IDC STAT BRADY AS VP PERCENT: 19.29 %
MDC IDC STAT BRADY RA PERCENT PACED: 80.7 %
Zone Setting Detection Interval: 450 ms

## 2014-06-16 LAB — PACEMAKER DEVICE OBSERVATION

## 2014-06-16 NOTE — Patient Instructions (Signed)
Remote monitoring is used to monitor your Pacemaker of ICD from home. This monitoring reduces the number of office visits required to check your device to one time per year. It allows [place] to keep an eye on the functioning of your device to ensure it is working properly. You are scheduled for a device check from home on 09/17/14. You may send your transmission at any time that day. If you have a wireless device, the transmission will be sent automatically. After your physician reviews your transmission, you will receive a postcard with your next transmission date.  Your physician wants you to follow-up in: 1 year with Dr. Lovena Le. You will receive a reminder letter in the mail two months in advance. If you don't receive a letter, please call our office to schedule the follow-up appointment.  Your physician recommends that you continue on your current medications as directed. Please refer to the Current Medication list given to you today.

## 2014-06-16 NOTE — Progress Notes (Signed)
HPI [name] returns today for followup. He is a pleasant 73 yo man with a h/o an ICM, chronic systolic CHF, s/p BiV ICD implant. He has done well in the interim. He denies chest pain or sob. His appetite is still not great but no weight loss. He tries to stay active.  No edema. No ICD shock. No Known Allergies   Current Outpatient Prescriptions  Medication Sig Dispense Refill  . aspirin 81 MG tablet Take 81 mg by mouth daily. 2 po daily       . carvedilol (COREG) 25 MG tablet Take 1 tablet (25 mg total) by mouth 2 (two) times daily with a meal.  180 tablet  3  . digoxin (LANOXIN) 0.25 MG tablet Take 1 tablet (250 mcg total) by mouth daily.  90 tablet  3  . eplerenone (INSPRA) 25 MG tablet Take 1 tablet (25 mg total) by mouth daily.  90 tablet  3  . furosemide (LASIX) 20 MG tablet Take 1 tablet (20 mg total) by mouth daily.  90 tablet  3  . levothyroxine (SYNTHROID, LEVOTHROID) 112 MCG tablet Take 112 mcg by mouth daily before breakfast.      . niacin 500 MG tablet Take 500 mg by mouth daily with breakfast.        . pantoprazole (PROTONIX) 40 MG tablet Take 1 tablet by mouth Daily.      . simvastatin (ZOCOR) 20 MG tablet Take 1 tablet (20 mg total) by mouth at bedtime.  90 tablet  3  . trandolapril (MAVIK) 2 MG tablet Take 1.5 tablets (3 mg total) by mouth daily.  90 tablet  3   No current facility-administered medications for this visit.     Past Medical History  Diagnosis Date  . Ischemic cardiomyopathy      status post non-ST elevation microinfarction stent to the right coronary artery is a   . LV dysfunction      NYHA class I/prior ejection fraction 20-25% and an ejection fraction 7 2009 50-55%, ejection fraction 60% bedside echocardiogram July 2012   . Ventricular tachycardia     Inducible ventricular polymorphic tachycardia status post ICD followed by upgrade with CRT-D 2007, battery change at 01/31/2011  . Drug-induced gynecomastia      secondary to spironolactone  . Carotid  artery disease      less than 50% stenosis bilaterally  . Aortic insufficiency      moderate aortic ins moderate/asymptomatic/normal LV cavity size.   Marland Kitchen Dyslipidemia   . Pulmonary embolism     Prior history of pulmonary embolism off Coumadin.  . ICD (implantable cardiac defibrillator), biventricular, in situ      Medtronic model  [id number]    ROS:   All systems reviewed and negative except as noted in the HPI.   Past Surgical History  Procedure Laterality Date  . Thoracotomy      left  . Coronary stent placement    . Retropubic prostatectomy    . Inguinal hernia repair    . Cardiac defibrillator placement      medtronic, remote - yes  . Left breast biopsy       Family History  Problem Relation Age of Onset  . Diabetes type I Sister     daughter   . Clotting disorder Neg Hx     also no repiratory disease   . Prostate cancer Neg Hx      History   Social History  . Marital Status: Married  Spouse Name: N/A    Number of Children: 2  . Years of Education: N/A   Occupational History  . plumber - self employed    Social History Main Topics  . Smoking status: Former Smoker    Types: Cigarettes    Quit date: 06/16/2009  . Smokeless tobacco: Never Used  . Alcohol Use: No  . Drug Use: No  . Sexual Activity: Not on file   Other Topics Concern  . Not on file   Social History Narrative   Self employed Development worker, community; 2 daughters.      BP 132/62  Pulse 66  Ht 5\' 8"  (1.727 m)  Wt 142 lb (64.411 kg)  BMI 21.60 kg/m2  SpO2 99%  Physical Exam:  Well appearing 73 yo man, NAD HEENT: Unremarkable Neck:  7 cm JVD, no thyromegally Back:  No CVA tenderness Lungs:  Clear with no wheezes, well healed ICD incision. HEART:  Regular rate rhythm, no murmurs, no rubs, no clicks Abd:  soft, positive bowel sounds, no organomegally, no rebound, no guarding Ext:  2 plus pulses, no edema, no cyanosis, no clubbing Skin:  No rashes no nodules Neuro:  CN II through XII intact,  motor grossly intact   DEVICE  Normal device function.  See PaceArt for details.   Assess/Plan:

## 2014-06-16 NOTE — Assessment & Plan Note (Signed)
His Medtronic BiV ICD is working normally. Will recheck in several months. 

## 2014-06-16 NOTE — Assessment & Plan Note (Signed)
He has had no recurrent ventricular arrhythmias. Will follow. No change in meds. 

## 2014-06-16 NOTE — Assessment & Plan Note (Signed)
He denies anginal symptoms. He remains active. 

## 2014-08-21 ENCOUNTER — Other Ambulatory Visit: Payer: Self-pay

## 2014-08-21 ENCOUNTER — Encounter (HOSPITAL_COMMUNITY)
Admission: RE | Admit: 2014-08-21 | Discharge: 2014-08-21 | Disposition: A | Discharge status: Home / Self Care | Payer: Medicare Other | Source: Ambulatory Visit | Attending: Ophthalmology | Admitting: Ophthalmology

## 2014-08-21 ENCOUNTER — Encounter (HOSPITAL_COMMUNITY): Payer: Self-pay

## 2014-08-21 DIAGNOSIS — Z01818 Encounter for other preprocedural examination: Secondary | ICD-10-CM | POA: Insufficient documentation

## 2014-08-21 HISTORY — DX: Presence of automatic (implantable) cardiac defibrillator: Z95.810

## 2014-08-21 HISTORY — DX: Other specified postprocedural states: Z98.890

## 2014-08-21 HISTORY — DX: Nausea with vomiting, unspecified: R11.2

## 2014-08-21 LAB — BASIC METABOLIC PANEL
ANION GAP: 10 (ref 5–15)
BUN: 14 mg/dL (ref 6–23)
CHLORIDE: 102 meq/L (ref 96–112)
CO2: 29 meq/L (ref 19–32)
Calcium: 9.8 mg/dL (ref 8.4–10.5)
Creatinine, Ser: 1.01 mg/dL (ref 0.50–1.35)
GFR calc Af Amer: 84 mL/min — ABNORMAL LOW (ref >90)
GFR calc non Af Amer: 72 mL/min — ABNORMAL LOW (ref >90)
GLUCOSE: 89 mg/dL (ref 70–99)
Potassium: 4.9 mEq/L (ref 3.7–5.3)
SODIUM: 141 meq/L (ref 137–147)

## 2014-08-21 LAB — HEMOGLOBIN AND HEMATOCRIT, BLOOD
HCT: 39.7 % (ref 39.0–52.0)
Hemoglobin: 13.5 g/dL (ref 13.0–17.0)

## 2014-08-21 NOTE — Patient Instructions (Signed)
Your procedure is scheduled on: 08/25/2014  Report to [facility] at  800  AM.  Call this number if you have problems the morning of surgery: [phone]   Do not eat food or drink liquids :After Midnight.      Take these medicines the morning of surgery with A SIP OF WATER: inspra, protonix, synthroid, mavik, digoxin, coreg   Do not wear jewelry, make-up or nail polish.  Do not wear lotions, powders, or perfumes.  Do not shave 48 hours prior to surgery.  Do not bring valuables to the hospital.  Contacts, dentures or bridgework may not be worn into surgery.  Leave suitcase in the car. After surgery it may be brought to your room.  For patients admitted to the hospital, checkout time is 11:00 AM the day of discharge.   Patients discharged the day of surgery will not be allowed to drive home.  :     Please read over the following fact sheets that you were given: Coughing and Deep Breathing, Surgical Site Infection Prevention, Anesthesia Post-op Instructions and Care and Recovery After Surgery    Cataract A cataract is a clouding of the lens of the eye. When a lens becomes cloudy, vision is reduced based on the degree and nature of the clouding. Many cataracts reduce vision to some degree. Some cataracts make people more near-sighted as they develop. Other cataracts increase glare. Cataracts that are ignored and become worse can sometimes look white. The white color can be seen through the pupil. CAUSES   Aging. However, cataracts may occur at any age, even in newborns.   Certain drugs.   Trauma to the eye.   Certain diseases such as diabetes.   Specific eye diseases such as chronic inflammation inside the eye or a sudden attack of a rare form of glaucoma.   Inherited or acquired medical problems.  SYMPTOMS   Gradual, progressive drop in vision in the affected eye.   Severe, rapid visual loss. This most often happens when trauma is the cause.  DIAGNOSIS  To detect a cataract, an  eye doctor examines the lens. Cataracts are best diagnosed with an exam of the eyes with the pupils enlarged (dilated) by drops.  TREATMENT  For an early cataract, vision may improve by using different eyeglasses or stronger lighting. If that does not help your vision, surgery is the only effective treatment. A cataract needs to be surgically removed when vision loss interferes with your everyday activities, such as driving, reading, or watching TV. A cataract may also have to be removed if it prevents examination or treatment of another eye problem. Surgery removes the cloudy lens and usually replaces it with a substitute lens (intraocular lens, IOL).  At a time when both you and your doctor agree, the cataract will be surgically removed. If you have cataracts in both eyes, only one is usually removed at a time. This allows the operated eye to heal and be out of danger from any possible problems after surgery (such as infection or poor wound healing). In rare cases, a cataract may be doing damage to your eye. In these cases, your caregiver may advise surgical removal right away. The vast majority of people who have cataract surgery have better vision afterward. HOME CARE INSTRUCTIONS  If you are not planning surgery, you may be asked to do the following:  Use different eyeglasses.   Use stronger or brighter lighting.   Ask your eye doctor about reducing your medicine dose  or changing medicines if it is thought that a medicine caused your cataract. Changing medicines does not make the cataract go away on its own.   Become familiar with your surroundings. Poor vision can lead to injury. Avoid bumping into things on the affected side. You are at a higher risk for tripping or falling.   Exercise extreme care when driving or operating machinery.   Wear sunglasses if you are sensitive to bright light or experiencing problems with glare.  SEEK IMMEDIATE MEDICAL CARE IF:   You have a worsening or sudden  vision loss.   You notice redness, swelling, or increasing pain in the eye.   You have a fever.  Document Released: 10/03/2005 Document Revised: 09/22/2011 Document Reviewed: 05/27/2011 [facility] Patient Information 2012 [place].PATIENT INSTRUCTIONS POST-ANESTHESIA  IMMEDIATELY FOLLOWING SURGERY:  Do not drive or operate machinery for the first twenty four hours after surgery.  Do not make any important decisions for twenty four hours after surgery or while taking narcotic pain medications or sedatives.  If you develop intractable nausea and vomiting or a severe headache please notify your doctor immediately.  FOLLOW-UP:  Please make an appointment with your surgeon as instructed. You do not need to follow up with anesthesia unless specifically instructed to do so.  WOUND CARE INSTRUCTIONS (if applicable):  Keep a dry clean dressing on the anesthesia/puncture wound site if there is drainage.  Once the wound has quit draining you may leave it open to air.  Generally you should leave the bandage intact for twenty four hours unless there is drainage.  If the epidural site drains for more than 36-48 hours please call the anesthesia department.  QUESTIONS?:  Please feel free to call your physician or the hospital operator if you have any questions, and they will be happy to assist you.

## 2014-08-21 NOTE — Pre-Procedure Instructions (Signed)
Pt. Given info for MyChart. To be set up at home. 

## 2014-08-22 MED ORDER — LIDOCAINE HCL 3.5 % OP GEL
OPHTHALMIC | Status: AC
  Filled 2014-08-22: qty 1

## 2014-08-22 MED ORDER — PHENYLEPHRINE HCL 2.5 % OP SOLN
OPHTHALMIC | Status: AC
  Filled 2014-08-22: qty 15

## 2014-08-22 MED ORDER — NEOMYCIN-POLYMYXIN-DEXAMETH 3.5-10000-0.1 OP SUSP
OPHTHALMIC | Status: AC
  Filled 2014-08-22: qty 5

## 2014-08-22 MED ORDER — CYCLOPENTOLATE-PHENYLEPHRINE OP SOLN OPTIME - NO CHARGE
OPHTHALMIC | Status: AC
  Filled 2014-08-22: qty 2

## 2014-08-22 MED ORDER — LIDOCAINE HCL (PF) 1 % IJ SOLN
INTRAMUSCULAR | Status: AC
  Filled 2014-08-22: qty 2

## 2014-08-22 MED ORDER — TETRACAINE HCL 0.5 % OP SOLN
OPHTHALMIC | Status: AC
  Filled 2014-08-22: qty 2

## 2014-08-25 ENCOUNTER — Encounter (HOSPITAL_COMMUNITY): Payer: Self-pay | Admitting: *Deleted

## 2014-08-25 ENCOUNTER — Ambulatory Visit (HOSPITAL_COMMUNITY): Payer: Medicare Other | Admitting: Anesthesiology

## 2014-08-25 ENCOUNTER — Ambulatory Visit (HOSPITAL_COMMUNITY)
Admission: RE | Admit: 2014-08-25 | Discharge: 2014-08-25 | Disposition: A | Discharge status: Home / Self Care | Payer: Medicare Other | Source: Ambulatory Visit | Attending: Ophthalmology | Admitting: Ophthalmology

## 2014-08-25 ENCOUNTER — Encounter (HOSPITAL_COMMUNITY)
Admission: RE | Disposition: A | Discharge status: Home / Self Care | Payer: Self-pay | Source: Ambulatory Visit | Attending: Ophthalmology

## 2014-08-25 DIAGNOSIS — I2510 Atherosclerotic heart disease of native coronary artery without angina pectoris: Secondary | ICD-10-CM | POA: Insufficient documentation

## 2014-08-25 DIAGNOSIS — I10 Essential (primary) hypertension: Secondary | ICD-10-CM | POA: Diagnosis not present

## 2014-08-25 DIAGNOSIS — Z79899 Other long term (current) drug therapy: Secondary | ICD-10-CM | POA: Insufficient documentation

## 2014-08-25 DIAGNOSIS — Z95810 Presence of automatic (implantable) cardiac defibrillator: Secondary | ICD-10-CM | POA: Insufficient documentation

## 2014-08-25 DIAGNOSIS — H25812 Combined forms of age-related cataract, left eye: Secondary | ICD-10-CM | POA: Diagnosis not present

## 2014-08-25 DIAGNOSIS — Z7982 Long term (current) use of aspirin: Secondary | ICD-10-CM | POA: Insufficient documentation

## 2014-08-25 DIAGNOSIS — Z87891 Personal history of nicotine dependence: Secondary | ICD-10-CM | POA: Insufficient documentation

## 2014-08-25 DIAGNOSIS — J449 Chronic obstructive pulmonary disease, unspecified: Secondary | ICD-10-CM | POA: Insufficient documentation

## 2014-08-25 HISTORY — PX: CATARACT EXTRACTION W/PHACO: SHX586

## 2014-08-25 SURGERY — PHACOEMULSIFICATION, CATARACT, WITH IOL INSERTION
Anesthesia: Monitor Anesthesia Care | Site: Eye | Laterality: Left

## 2014-08-25 MED ORDER — LIDOCAINE 3.5 % OP GEL OPTIME - NO CHARGE
OPHTHALMIC | Status: DC | PRN
  Administered 2014-08-25: 1 [drp] via OPHTHALMIC

## 2014-08-25 MED ORDER — NEOMYCIN-POLYMYXIN-DEXAMETH 3.5-10000-0.1 OP SUSP
OPHTHALMIC | Status: DC | PRN
  Administered 2014-08-25: 2 [drp] via OPHTHALMIC

## 2014-08-25 MED ORDER — LACTATED RINGERS IV SOLN
INTRAVENOUS | Status: DC
  Administered 2014-08-25: 1000 mL via INTRAVENOUS

## 2014-08-25 MED ORDER — CYCLOPENTOLATE-PHENYLEPHRINE 0.2-1 % OP SOLN
1.0000 [drp] | OPHTHALMIC | Status: AC
  Administered 2014-08-25 (×3): 1 [drp] via OPHTHALMIC

## 2014-08-25 MED ORDER — EPINEPHRINE HCL 1 MG/ML IJ SOLN
INTRAMUSCULAR | Status: AC
  Filled 2014-08-25: qty 1

## 2014-08-25 MED ORDER — BSS IO SOLN
INTRAOCULAR | Status: DC | PRN
  Administered 2014-08-25: 15 mL

## 2014-08-25 MED ORDER — PROVISC 10 MG/ML IO SOLN
INTRAOCULAR | Status: DC | PRN
  Administered 2014-08-25: 0.85 mL via INTRAOCULAR

## 2014-08-25 MED ORDER — MIDAZOLAM HCL 2 MG/2ML IJ SOLN
1.0000 mg | INTRAMUSCULAR | Status: DC | PRN
Start: 2014-08-25 — End: 2014-08-25
  Administered 2014-08-25: 2 mg via INTRAVENOUS

## 2014-08-25 MED ORDER — FENTANYL CITRATE 0.05 MG/ML IJ SOLN
25.0000 ug | INTRAMUSCULAR | Status: AC
  Administered 2014-08-25 (×2): 25 ug via INTRAVENOUS

## 2014-08-25 MED ORDER — FENTANYL CITRATE 0.05 MG/ML IJ SOLN
INTRAMUSCULAR | Status: AC
  Filled 2014-08-25: qty 2

## 2014-08-25 MED ORDER — POVIDONE-IODINE 5 % OP SOLN
OPHTHALMIC | Status: DC | PRN
  Administered 2014-08-25: 1 via OPHTHALMIC

## 2014-08-25 MED ORDER — TETRACAINE HCL 0.5 % OP SOLN
1.0000 [drp] | OPHTHALMIC | Status: AC
  Administered 2014-08-25 (×3): 1 [drp] via OPHTHALMIC

## 2014-08-25 MED ORDER — LIDOCAINE HCL 3.5 % OP GEL
1.0000 "application " | Freq: Once | OPHTHALMIC | Status: AC
  Administered 2014-08-25: 1 via OPHTHALMIC

## 2014-08-25 MED ORDER — LIDOCAINE HCL (PF) 1 % IJ SOLN
INTRAMUSCULAR | Status: DC | PRN
  Administered 2014-08-25: .5 mL

## 2014-08-25 MED ORDER — EPINEPHRINE HCL 1 MG/ML IJ SOLN
INTRAOCULAR | Status: DC | PRN
  Administered 2014-08-25: 500 mL

## 2014-08-25 MED ORDER — PHENYLEPHRINE HCL 2.5 % OP SOLN
1.0000 [drp] | OPHTHALMIC | Status: AC
  Administered 2014-08-25 (×3): 1 [drp] via OPHTHALMIC

## 2014-08-25 MED ORDER — MIDAZOLAM HCL 2 MG/2ML IJ SOLN
INTRAMUSCULAR | Status: AC
  Filled 2014-08-25: qty 2

## 2014-08-25 SURGICAL SUPPLY — 34 items
CAPSULAR TENSION RING-AMO (OPHTHALMIC RELATED) IMPLANT
CLOTH BEACON ORANGE TIMEOUT ST (SAFETY) ×3 IMPLANT
EYE SHIELD UNIVERSAL CLEAR (GAUZE/BANDAGES/DRESSINGS) ×3 IMPLANT
GLOVE BIO SURGEON STRL SZ 6.5 (GLOVE) IMPLANT
GLOVE BIO SURGEONS STRL SZ 6.5 (GLOVE)
GLOVE BIOGEL PI IND STRL 6.5 (GLOVE) ×2 IMPLANT
GLOVE BIOGEL PI IND STRL 7.0 (GLOVE) IMPLANT
GLOVE BIOGEL PI IND STRL 7.5 (GLOVE) IMPLANT
GLOVE BIOGEL PI INDICATOR 6.5 (GLOVE) ×4
GLOVE BIOGEL PI INDICATOR 7.0 (GLOVE)
GLOVE BIOGEL PI INDICATOR 7.5 (GLOVE)
GLOVE ECLIPSE 6.5 STRL STRAW (GLOVE) IMPLANT
GLOVE ECLIPSE 7.0 STRL STRAW (GLOVE) IMPLANT
GLOVE ECLIPSE 7.5 STRL STRAW (GLOVE) IMPLANT
GLOVE EXAM NITRILE LRG STRL (GLOVE) IMPLANT
GLOVE EXAM NITRILE MD LF STRL (GLOVE) IMPLANT
GLOVE SKINSENSE NS SZ6.5 (GLOVE)
GLOVE SKINSENSE NS SZ7.0 (GLOVE)
GLOVE SKINSENSE STRL SZ6.5 (GLOVE) IMPLANT
GLOVE SKINSENSE STRL SZ7.0 (GLOVE) IMPLANT
KIT VITRECTOMY (OPHTHALMIC RELATED) IMPLANT
PAD ARMBOARD 7.5X6 YLW CONV (MISCELLANEOUS) IMPLANT
PROC W NO LENS (INTRAOCULAR LENS)
PROC W SPEC LENS (INTRAOCULAR LENS)
PROCESS W NO LENS (INTRAOCULAR LENS) IMPLANT
PROCESS W SPEC LENS (INTRAOCULAR LENS) IMPLANT
RETRACTOR IRIS SIGHTPATH (OPHTHALMIC RELATED) IMPLANT
RING MALYGIN (MISCELLANEOUS) IMPLANT
SIGHTPATH CAT PROC W REG LENS (Ophthalmic Related) ×3 IMPLANT
SYRINGE LUER LOK 1CC (MISCELLANEOUS) ×3 IMPLANT
TAPE SURG TRANSPORE 1 IN (GAUZE/BANDAGES/DRESSINGS) ×1 IMPLANT
TAPE SURGICAL TRANSPORE 1 IN (GAUZE/BANDAGES/DRESSINGS) ×2
VISCOELASTIC ADDITIONAL (OPHTHALMIC RELATED) IMPLANT
WATER STERILE IRR 250ML POUR (IV SOLUTION) ×3 IMPLANT

## 2014-08-25 NOTE — Transfer of Care (Signed)
Immediate Anesthesia Transfer of Care Note  Patient: [name]  Procedure(s) Performed: Procedure(s) with comments: CATARACT EXTRACTION PHACO AND INTRAOCULAR LENS PLACEMENT (IOC) (Left) - CDE 5.79  Patient Location: Short Stay  Anesthesia Type:MAC  Level of Consciousness: awake  Airway & Oxygen Therapy: Patient Spontanous Breathing  Post-op Assessment: Report given to PACU RN  Post vital signs: Reviewed  Complications: No apparent anesthesia complications

## 2014-08-25 NOTE — Discharge Instructions (Signed)

## 2014-08-25 NOTE — Anesthesia Postprocedure Evaluation (Signed)
  Anesthesia Post-op Note  Patient: [name]  Procedure(s) Performed: Procedure(s) with comments: CATARACT EXTRACTION PHACO AND INTRAOCULAR LENS PLACEMENT (IOC) (Left) - CDE 5.79  Patient Location: Short Stay  Anesthesia Type:MAC  Level of Consciousness: awake, alert  and oriented  Airway and Oxygen Therapy: Patient Spontanous Breathing  Post-op Pain: none  Post-op Assessment: Post-op Vital signs reviewed  Post-op Vital Signs: Reviewed and stable  Last Vitals:  Filed Vitals:   08/25/14 0816  BP: 154/71  Pulse: 62  Temp: 36.6 C  Resp: 61    Complications: No apparent anesthesia complications

## 2014-08-25 NOTE — Anesthesia Preprocedure Evaluation (Signed)
Anesthesia Evaluation  Patient identified by MRN, date of birth, ID band Patient awake    Reviewed: Allergy & Precautions, H&P , NPO status   History of Anesthesia Complications (+) PONV and history of anesthetic complications  Airway Mallampati: II  TM Distance: >3 FB     Dental  (+) Edentulous Upper   Pulmonary shortness of breath and with exertion, COPDformer smoker,  breath sounds clear to auscultation        Cardiovascular hypertension, Pt. on medications + CAD, + Peripheral Vascular Disease and +CHF + dysrhythmias Ventricular Tachycardia + Cardiac Defibrillator + Valvular Problems/Murmurs AI Rhythm:Regular Rate:Normal     Neuro/Psych    GI/Hepatic negative GI ROS,   Endo/Other    Renal/GU Renal disease     Musculoskeletal   Abdominal   Peds  Hematology   Anesthesia Other Findings   Reproductive/Obstetrics                             Anesthesia Physical Anesthesia Plan  ASA: IV  Anesthesia Plan: MAC   Post-op Pain Management:    Induction: Intravenous  Airway Management Planned: Nasal Cannula  Additional Equipment:   Intra-op Plan:   Post-operative Plan:   Informed Consent: I have reviewed the patients History and Physical, chart, labs and discussed the procedure including the risks, benefits and alternatives for the proposed anesthesia with the patient or authorized representative who has indicated his/her understanding and acceptance.     Plan Discussed with:   Anesthesia Plan Comments:         Anesthesia Quick Evaluation

## 2014-08-25 NOTE — Op Note (Signed)
Date of Admission: 08/25/2014  Date of Surgery: 08/25/2014   Pre-Op Dx: Cataract Left Eye  Post-Op Dx: Senile Combined Cataract Left  Eye,  Dx Code R94.585  Surgeon: Tonny Branch, M.D.  Assistants: None  Anesthesia: Topical with MAC  Indications: Painless, progressive loss of vision with compromise of daily activities.  Surgery: Cataract Extraction with Intraocular lens Implant Left Eye  Discription: The patient had dilating drops and viscous lidocaine placed into the Left eye in the pre-op holding area. After transfer to the operating room, a time out was performed. The patient was then prepped and draped. Beginning with a 31 degree blade a paracentesis port was made at the surgeon's 2 o'clock position. The anterior chamber was then filled with 1% non-preserved lidocaine. This was followed by filling the anterior chamber with Provisc.  A 2.82mm keratome blade was used to make a clear corneal incision at the temporal limbus.  A bent cystatome needle was used to create a continuous tear capsulotomy. Hydrodissection was performed with balanced salt solution on a Fine canula. The lens nucleus was then removed using the phacoemulsification handpiece. Residual cortex was removed with the I&A handpiece. The anterior chamber and capsular bag were refilled with Provisc. A posterior chamber intraocular lens was placed into the capsular bag with it's injector. The implant was positioned with the Kuglan hook. The Provisc was then removed from the anterior chamber and capsular bag with the I&A handpiece. Stromal hydration of the main incision and paracentesis port was performed with BSS on a Fine canula. The wounds were tested for leak which was negative. The patient tolerated the procedure well. There were no operative complications. The patient was then transferred to the recovery room in stable condition.  Complications: None  Specimen: None  EBL: None  Prosthetic device: Hoya iSert 250, power 20.5 D, SN  [id number].

## 2014-08-25 NOTE — H&P (Signed)
I have reviewed the H&P, the patient was re-examined, and I have identified no interval changes in medical condition and plan of care since the history and physical of record  

## 2014-08-26 ENCOUNTER — Encounter (HOSPITAL_COMMUNITY): Payer: Self-pay | Admitting: Ophthalmology

## 2014-09-16 ENCOUNTER — Encounter (HOSPITAL_COMMUNITY): Payer: Self-pay

## 2014-09-16 ENCOUNTER — Encounter (HOSPITAL_COMMUNITY)
Admission: RE | Admit: 2014-09-16 | Discharge: 2014-09-16 | Disposition: A | Discharge status: Home / Self Care | Payer: Medicare Other | Source: Ambulatory Visit | Attending: Ophthalmology | Admitting: Ophthalmology

## 2014-09-17 ENCOUNTER — Encounter: Payer: Self-pay | Admitting: Internal Medicine

## 2014-09-17 ENCOUNTER — Ambulatory Visit (INDEPENDENT_AMBULATORY_CARE_PROVIDER_SITE_OTHER): Payer: Medicare Other | Admitting: *Deleted

## 2014-09-17 ENCOUNTER — Telehealth: Payer: Self-pay | Admitting: Cardiology

## 2014-09-17 DIAGNOSIS — I255 Ischemic cardiomyopathy: Secondary | ICD-10-CM

## 2014-09-17 DIAGNOSIS — I472 Ventricular tachycardia, unspecified: Secondary | ICD-10-CM

## 2014-09-17 DIAGNOSIS — I5022 Chronic systolic (congestive) heart failure: Secondary | ICD-10-CM

## 2014-09-17 LAB — MDC_IDC_ENUM_SESS_TYPE_REMOTE
Brady Statistic AP VS Percent: 0.02 %
Brady Statistic AS VP Percent: 12.92 %
Brady Statistic AS VS Percent: 0.01 %
Brady Statistic RV Percent Paced: 99.97 %
Date Time Interrogation Session: 20151202191430
HIGH POWER IMPEDANCE MEASURED VALUE: 437 Ohm
HIGH POWER IMPEDANCE MEASURED VALUE: 52 Ohm
HighPow Impedance: 63 Ohm
Lead Channel Impedance Value: 4047 Ohm
Lead Channel Impedance Value: 4047 Ohm
Lead Channel Impedance Value: 513 Ohm
Lead Channel Impedance Value: 570 Ohm
Lead Channel Pacing Threshold Amplitude: 0.5 V
Lead Channel Pacing Threshold Amplitude: 1.25 V
Lead Channel Pacing Threshold Amplitude: 1.625 V
Lead Channel Pacing Threshold Pulse Width: 0.4 ms
Lead Channel Pacing Threshold Pulse Width: 0.4 ms
Lead Channel Pacing Threshold Pulse Width: 1 ms
Lead Channel Sensing Intrinsic Amplitude: 10.625 mV
Lead Channel Setting Pacing Amplitude: 2.25 V
Lead Channel Setting Pacing Amplitude: 2.75 V
Lead Channel Setting Pacing Pulse Width: 1 ms
MDC IDC MSMT BATTERY VOLTAGE: 2.97 V
MDC IDC MSMT LEADCHNL LV IMPEDANCE VALUE: 456 Ohm
MDC IDC MSMT LEADCHNL RA SENSING INTR AMPL: 4.625 mV
MDC IDC MSMT LEADCHNL RA SENSING INTR AMPL: 4.625 mV
MDC IDC MSMT LEADCHNL RV SENSING INTR AMPL: 10.625 mV
MDC IDC SET LEADCHNL RA PACING AMPLITUDE: 2 V
MDC IDC SET LEADCHNL RV PACING PULSEWIDTH: 0.4 ms
MDC IDC SET LEADCHNL RV SENSING SENSITIVITY: 0.45 mV
MDC IDC SET ZONE DETECTION INTERVAL: 350 ms
MDC IDC STAT BRADY AP VP PERCENT: 87.06 %
MDC IDC STAT BRADY RA PERCENT PACED: 87.07 %
Zone Setting Detection Interval: 300 ms
Zone Setting Detection Interval: 350 ms
Zone Setting Detection Interval: 450 ms

## 2014-09-17 MED ORDER — NEOMYCIN-POLYMYXIN-DEXAMETH 3.5-10000-0.1 OP SUSP
OPHTHALMIC | Status: AC
  Filled 2014-09-17: qty 5

## 2014-09-17 MED ORDER — LIDOCAINE HCL (PF) 1 % IJ SOLN
INTRAMUSCULAR | Status: AC
  Filled 2014-09-17: qty 2

## 2014-09-17 MED ORDER — CYCLOPENTOLATE-PHENYLEPHRINE OP SOLN OPTIME - NO CHARGE
OPHTHALMIC | Status: AC
  Filled 2014-09-17: qty 2

## 2014-09-17 MED ORDER — LIDOCAINE HCL 3.5 % OP GEL
OPHTHALMIC | Status: AC
  Filled 2014-09-17: qty 1

## 2014-09-17 MED ORDER — TETRACAINE HCL 0.5 % OP SOLN
OPHTHALMIC | Status: AC
  Filled 2014-09-17: qty 2

## 2014-09-17 MED ORDER — PHENYLEPHRINE HCL 2.5 % OP SOLN
OPHTHALMIC | Status: AC
  Filled 2014-09-17: qty 15

## 2014-09-17 NOTE — Telephone Encounter (Signed)
Confirmed remote transmission w/ pt wife.   

## 2014-09-17 NOTE — Progress Notes (Signed)
Remote ICD transmission.   

## 2014-09-18 ENCOUNTER — Ambulatory Visit (HOSPITAL_COMMUNITY)
Admission: RE | Admit: 2014-09-18 | Discharge: 2014-09-18 | Disposition: A | Discharge status: Home / Self Care | Payer: Medicare Other | Source: Ambulatory Visit | Attending: Ophthalmology | Admitting: Ophthalmology

## 2014-09-18 ENCOUNTER — Encounter (HOSPITAL_COMMUNITY): Payer: Self-pay | Admitting: *Deleted

## 2014-09-18 ENCOUNTER — Ambulatory Visit (HOSPITAL_COMMUNITY): Payer: Medicare Other | Admitting: Anesthesiology

## 2014-09-18 ENCOUNTER — Encounter (HOSPITAL_COMMUNITY)
Admission: RE | Disposition: A | Discharge status: Home / Self Care | Payer: Self-pay | Source: Ambulatory Visit | Attending: Ophthalmology

## 2014-09-18 DIAGNOSIS — E785 Hyperlipidemia, unspecified: Secondary | ICD-10-CM | POA: Diagnosis not present

## 2014-09-18 DIAGNOSIS — I10 Essential (primary) hypertension: Secondary | ICD-10-CM | POA: Diagnosis not present

## 2014-09-18 DIAGNOSIS — Z87891 Personal history of nicotine dependence: Secondary | ICD-10-CM | POA: Insufficient documentation

## 2014-09-18 DIAGNOSIS — J449 Chronic obstructive pulmonary disease, unspecified: Secondary | ICD-10-CM | POA: Insufficient documentation

## 2014-09-18 DIAGNOSIS — N289 Disorder of kidney and ureter, unspecified: Secondary | ICD-10-CM | POA: Diagnosis not present

## 2014-09-18 DIAGNOSIS — H25811 Combined forms of age-related cataract, right eye: Secondary | ICD-10-CM | POA: Diagnosis present

## 2014-09-18 DIAGNOSIS — I351 Nonrheumatic aortic (valve) insufficiency: Secondary | ICD-10-CM | POA: Diagnosis not present

## 2014-09-18 DIAGNOSIS — Z87442 Personal history of urinary calculi: Secondary | ICD-10-CM | POA: Insufficient documentation

## 2014-09-18 DIAGNOSIS — R0602 Shortness of breath: Secondary | ICD-10-CM | POA: Diagnosis not present

## 2014-09-18 DIAGNOSIS — I2699 Other pulmonary embolism without acute cor pulmonale: Secondary | ICD-10-CM | POA: Diagnosis not present

## 2014-09-18 DIAGNOSIS — I255 Ischemic cardiomyopathy: Secondary | ICD-10-CM | POA: Insufficient documentation

## 2014-09-18 DIAGNOSIS — I519 Heart disease, unspecified: Secondary | ICD-10-CM | POA: Insufficient documentation

## 2014-09-18 HISTORY — PX: CATARACT EXTRACTION W/PHACO: SHX586

## 2014-09-18 SURGERY — PHACOEMULSIFICATION, CATARACT, WITH IOL INSERTION
Anesthesia: Monitor Anesthesia Care | Site: Eye | Laterality: Right

## 2014-09-18 MED ORDER — BSS IO SOLN
INTRAOCULAR | Status: DC | PRN
  Administered 2014-09-18: 30 mL via INTRAOCULAR

## 2014-09-18 MED ORDER — FENTANYL CITRATE 0.05 MG/ML IJ SOLN
INTRAMUSCULAR | Status: AC
  Filled 2014-09-18: qty 2

## 2014-09-18 MED ORDER — MIDAZOLAM HCL 2 MG/2ML IJ SOLN
INTRAMUSCULAR | Status: AC
  Filled 2014-09-18: qty 2

## 2014-09-18 MED ORDER — LACTATED RINGERS IV SOLN
INTRAVENOUS | Status: DC
  Administered 2014-09-18: 1000 mL via INTRAVENOUS

## 2014-09-18 MED ORDER — PHENYLEPHRINE HCL 2.5 % OP SOLN
1.0000 [drp] | OPHTHALMIC | Status: AC
  Administered 2014-09-18 (×3): 1 [drp] via OPHTHALMIC

## 2014-09-18 MED ORDER — LIDOCAINE 3.5 % OP GEL OPTIME - NO CHARGE
OPHTHALMIC | Status: DC | PRN
  Administered 2014-09-18: 1 [drp] via OPHTHALMIC

## 2014-09-18 MED ORDER — NEOMYCIN-POLYMYXIN-DEXAMETH 3.5-10000-0.1 OP SUSP
OPHTHALMIC | Status: DC | PRN
  Administered 2014-09-18: 1 [drp] via OPHTHALMIC

## 2014-09-18 MED ORDER — CYCLOPENTOLATE-PHENYLEPHRINE 0.2-1 % OP SOLN
1.0000 [drp] | OPHTHALMIC | Status: AC
  Administered 2014-09-18 (×3): 1 [drp] via OPHTHALMIC

## 2014-09-18 MED ORDER — LIDOCAINE HCL 3.5 % OP GEL
1.0000 "application " | Freq: Once | OPHTHALMIC | Status: AC
  Administered 2014-09-18: 1 via OPHTHALMIC

## 2014-09-18 MED ORDER — PROVISC 10 MG/ML IO SOLN
INTRAOCULAR | Status: DC | PRN
  Administered 2014-09-18: 0.85 mL via INTRAOCULAR

## 2014-09-18 MED ORDER — EPINEPHRINE HCL 1 MG/ML IJ SOLN
INTRAMUSCULAR | Status: AC
  Filled 2014-09-18: qty 1

## 2014-09-18 MED ORDER — POVIDONE-IODINE 5 % OP SOLN
OPHTHALMIC | Status: DC | PRN
  Administered 2014-09-18: 1 via OPHTHALMIC

## 2014-09-18 MED ORDER — FENTANYL CITRATE 0.05 MG/ML IJ SOLN
25.0000 ug | INTRAMUSCULAR | Status: AC
  Administered 2014-09-18 (×2): 25 ug via INTRAVENOUS

## 2014-09-18 MED ORDER — EPINEPHRINE HCL 1 MG/ML IJ SOLN
INTRAOCULAR | Status: DC | PRN
  Administered 2014-09-18: 07:00:00

## 2014-09-18 MED ORDER — LIDOCAINE HCL (PF) 1 % IJ SOLN
INTRAMUSCULAR | Status: DC | PRN
  Administered 2014-09-18: .4 mL

## 2014-09-18 MED ORDER — TETRACAINE HCL 0.5 % OP SOLN
1.0000 [drp] | OPHTHALMIC | Status: AC
  Administered 2014-09-18 (×3): 1 [drp] via OPHTHALMIC

## 2014-09-18 MED ORDER — MIDAZOLAM HCL 2 MG/2ML IJ SOLN
1.0000 mg | INTRAMUSCULAR | Status: DC | PRN
  Administered 2014-09-18: 2 mg via INTRAVENOUS

## 2014-09-18 MED ORDER — LACTATED RINGERS IV SOLN
INTRAVENOUS | Status: DC | PRN
  Administered 2014-09-18: 07:00:00 via INTRAVENOUS

## 2014-09-18 SURGICAL SUPPLY — 34 items
CAPSULAR TENSION RING-AMO (OPHTHALMIC RELATED) IMPLANT
CLOTH BEACON ORANGE TIMEOUT ST (SAFETY) ×3 IMPLANT
EYE SHIELD UNIVERSAL CLEAR (GAUZE/BANDAGES/DRESSINGS) ×3 IMPLANT
GLOVE BIO SURGEON STRL SZ 6.5 (GLOVE) IMPLANT
GLOVE BIO SURGEONS STRL SZ 6.5 (GLOVE)
GLOVE BIOGEL PI IND STRL 6.5 (GLOVE) ×1 IMPLANT
GLOVE BIOGEL PI IND STRL 7.0 (GLOVE) IMPLANT
GLOVE BIOGEL PI IND STRL 7.5 (GLOVE) IMPLANT
GLOVE BIOGEL PI INDICATOR 6.5 (GLOVE) ×2
GLOVE BIOGEL PI INDICATOR 7.0 (GLOVE)
GLOVE BIOGEL PI INDICATOR 7.5 (GLOVE)
GLOVE ECLIPSE 6.5 STRL STRAW (GLOVE) IMPLANT
GLOVE ECLIPSE 7.0 STRL STRAW (GLOVE) IMPLANT
GLOVE ECLIPSE 7.5 STRL STRAW (GLOVE) IMPLANT
GLOVE EXAM NITRILE LRG STRL (GLOVE) IMPLANT
GLOVE EXAM NITRILE MD LF STRL (GLOVE) ×3 IMPLANT
GLOVE SKINSENSE NS SZ6.5 (GLOVE)
GLOVE SKINSENSE NS SZ7.0 (GLOVE)
GLOVE SKINSENSE STRL SZ6.5 (GLOVE) IMPLANT
GLOVE SKINSENSE STRL SZ7.0 (GLOVE) IMPLANT
KIT VITRECTOMY (OPHTHALMIC RELATED) IMPLANT
PAD ARMBOARD 7.5X6 YLW CONV (MISCELLANEOUS) ×3 IMPLANT
PROC W NO LENS (INTRAOCULAR LENS)
PROC W SPEC LENS (INTRAOCULAR LENS)
PROCESS W NO LENS (INTRAOCULAR LENS) IMPLANT
PROCESS W SPEC LENS (INTRAOCULAR LENS) IMPLANT
RETRACTOR IRIS SIGHTPATH (OPHTHALMIC RELATED) IMPLANT
RING MALYGIN (MISCELLANEOUS) IMPLANT
SIGHTPATH CAT PROC W REG LENS (Ophthalmic Related) ×3 IMPLANT
SYRINGE LUER LOK 1CC (MISCELLANEOUS) ×3 IMPLANT
TAPE SURG TRANSPORE 1 IN (GAUZE/BANDAGES/DRESSINGS) ×1 IMPLANT
TAPE SURGICAL TRANSPORE 1 IN (GAUZE/BANDAGES/DRESSINGS) ×2
VISCOELASTIC ADDITIONAL (OPHTHALMIC RELATED) IMPLANT
WATER STERILE IRR 250ML POUR (IV SOLUTION) ×3 IMPLANT

## 2014-09-18 NOTE — H&P (Signed)
I have reviewed the H&P, the patient was re-examined, and I have identified no interval changes in medical condition and plan of care since the history and physical of record  

## 2014-09-18 NOTE — Anesthesia Procedure Notes (Signed)
Procedure Name: MAC Date/Time: 09/18/2014 7:22 AM Performed by: Andree Elk, AMY A Pre-anesthesia Checklist: Patient identified, Timeout performed, Emergency Drugs available, Suction available and Patient being monitored Oxygen Delivery Method: Nasal cannula

## 2014-09-18 NOTE — Discharge Instructions (Signed)
Cataract Surgery °Care After °Refer to this sheet in the next few weeks. These instructions provide you with information on caring for yourself after your procedure. Your caregiver may also give you more specific instructions. Your treatment has been planned according to current medical practices, but problems sometimes occur. Call your caregiver if you have any problems or questions after your procedure.  °HOME CARE INSTRUCTIONS  °· Avoid strenuous activities as directed by your caregiver. °· Ask your caregiver when you can resume driving. °· Use eyedrops or other medicines to help healing and control pressure inside your eye as directed by your caregiver. °· Only take over-the-counter or prescription medicines for pain, discomfort, or fever as directed by your caregiver. °· Do not to touch or rub your eyes. °· You may be instructed to use a protective shield during the first few days and nights after surgery. If not, wear sunglasses to protect your eyes. This is to protect the eye from pressure or from being accidentally bumped. °· Keep the area around your eye clean and dry. Avoid swimming or allowing water to hit you directly in the face while showering. Keep soap and shampoo out of your eyes. °· Do not bend or lift heavy objects. Bending increases pressure in the eye. You can walk, climb stairs, and do light household chores. °· Do not put a contact lens into the eye that had surgery until your caregiver says it is okay to do so. °· Ask your doctor when you can return to work. This will depend on the kind of work that you do. If you work in a dusty environment, you may be advised to wear protective eyewear for a period of time. °· Ask your caregiver when it will be safe to engage in sexual activity. °· Continue with your regular eye exams as directed by your caregiver. °What to expect: °· It is normal to feel itching and mild discomfort for a few days after cataract surgery. Some fluid discharge is also common,  and your eye may be sensitive to light and touch. °· After 1 to 2 days, even moderate discomfort should disappear. In most cases, healing will take about 6 weeks. °· If you received an intraocular lens (IOL), you may notice that colors are very bright or have a blue tinge. Also, if you have been in bright sunlight, everything may appear reddish for a few hours. If you see these color tinges, it is because your lens is clear and no longer cloudy. Within a few months after receiving an IOL, these extra colors should go away. When you have healed, you will probably need new glasses. °SEEK MEDICAL CARE IF:  °· You have increased bruising around your eye. °· You have discomfort not helped by medicine. °SEEK IMMEDIATE MEDICAL CARE IF:  °· You have a  fever. °· You have a worsening or sudden vision loss. °· You have redness, swelling, or increasing pain in the eye. °· You have a thick discharge from the eye that had surgery. °MAKE SURE YOU: °· Understand these instructions. °· Will watch your condition. °· Will get help right away if you are not doing well or get worse. °Document Released: 04/22/2005 Document Revised: 12/26/2011 Document Reviewed: 05/27/2011 °Exit[employer]® Patient Information ©2015 [employer]. This information is not intended to replace advice given to you by your health care provider. Make sure you discuss any questions you have with your health care provider. ° °

## 2014-09-18 NOTE — Anesthesia Postprocedure Evaluation (Signed)
  Anesthesia Post-op Note  Patient: [name]  Procedure(s) Performed: Procedure(s) with comments: CATARACT EXTRACTION PHACO AND INTRAOCULAR LENS PLACEMENT RIGHT EYE (Right) - CDE:3.35  Patient Location: Short Stay  Anesthesia Type:MAC  Level of Consciousness: awake, alert , oriented and patient cooperative  Airway and Oxygen Therapy: Patient Spontanous Breathing  Post-op Pain: none  Post-op Assessment: Post-op Vital signs reviewed, Patient's Cardiovascular Status Stable, Respiratory Function Stable, Patent Airway, No signs of Nausea or vomiting and Pain level controlled  Post-op Vital Signs: Reviewed and stable  Last Vitals:  Filed Vitals:   09/18/14 0715  BP: 111/57  Pulse:   Temp:   Resp: 20    Complications: No apparent anesthesia complications

## 2014-09-18 NOTE — Op Note (Signed)
Date of Admission: 09/18/2014  Date of Surgery: 09/18/2014   Pre-Op Dx: Cataract Right Eye  Post-Op Dx: Senile Combined Cataract Right  Eye,  Dx Code L87.564  Surgeon: Tonny Branch, M.D.  Assistants: None  Anesthesia: Topical with MAC  Indications: Painless, progressive loss of vision with compromise of daily activities.  Surgery: Cataract Extraction with Intraocular lens Implant Right Eye  Discription: The patient had dilating drops and viscous lidocaine placed into the Right eye in the pre-op holding area. After transfer to the operating room, a time out was performed. The patient was then prepped and draped. Beginning with a 36 degree blade a paracentesis port was made at the surgeon's 2 o'clock position. The anterior chamber was then filled with 1% non-preserved lidocaine. This was followed by filling the anterior chamber with Provisc.  A 2.70mm keratome blade was used to make a clear corneal incision at the temporal limbus.  A bent cystatome needle was used to create a continuous tear capsulotomy. Hydrodissection was performed with balanced salt solution on a Fine canula. The lens nucleus was then removed using the phacoemulsification handpiece. Residual cortex was removed with the I&A handpiece. The anterior chamber and capsular bag were refilled with Provisc. A posterior chamber intraocular lens was placed into the capsular bag with it's injector. The implant was positioned with the Kuglan hook. The Provisc was then removed from the anterior chamber and capsular bag with the I&A handpiece. Stromal hydration of the main incision and paracentesis port was performed with BSS on a Fine canula. The wounds were tested for leak which was negative. The patient tolerated the procedure well. There were no operative complications. The patient was then transferred to the recovery room in stable condition.  Complications: None  Specimen: None  EBL: None  Prosthetic device: Hoya iSert 250, power 19.5  D, SN [id number].

## 2014-09-18 NOTE — Transfer of Care (Signed)
Immediate Anesthesia Transfer of Care Note  Patient: [name]  Procedure(s) Performed: Procedure(s) with comments: CATARACT EXTRACTION PHACO AND INTRAOCULAR LENS PLACEMENT RIGHT EYE (Right) - CDE:3.35  Patient Location: Short Stay  Anesthesia Type:MAC  Level of Consciousness: awake, alert , oriented and patient cooperative  Airway & Oxygen Therapy: Patient Spontanous Breathing  Post-op Assessment: Report given to PACU RN and Post -op Vital signs reviewed and stable  Post vital signs: Reviewed and stable  Complications: No apparent anesthesia complications

## 2014-09-18 NOTE — Anesthesia Preprocedure Evaluation (Signed)
Anesthesia Evaluation  Patient identified by MRN, date of birth, ID band Patient awake    Reviewed: Allergy & Precautions, H&P , NPO status   History of Anesthesia Complications (+) PONV and history of anesthetic complications  Airway Mallampati: II  TM Distance: >3 FB     Dental  (+) Edentulous Upper   Pulmonary shortness of breath and with exertion, COPDformer smoker,  breath sounds clear to auscultation        Cardiovascular hypertension, Pt. on medications + CAD, + Peripheral Vascular Disease and +CHF + dysrhythmias Ventricular Tachycardia + Cardiac Defibrillator + Valvular Problems/Murmurs AI Rhythm:Regular Rate:Normal     Neuro/Psych    GI/Hepatic negative GI ROS,   Endo/Other    Renal/GU Renal disease     Musculoskeletal   Abdominal   Peds  Hematology   Anesthesia Other Findings   Reproductive/Obstetrics                             Anesthesia Physical Anesthesia Plan  ASA: IV  Anesthesia Plan: MAC   Post-op Pain Management:    Induction: Intravenous  Airway Management Planned: Nasal Cannula  Additional Equipment:   Intra-op Plan:   Post-operative Plan:   Informed Consent: I have reviewed the patients History and Physical, chart, labs and discussed the procedure including the risks, benefits and alternatives for the proposed anesthesia with the patient or authorized representative who has indicated his/her understanding and acceptance.     Plan Discussed with:   Anesthesia Plan Comments:         Anesthesia Quick Evaluation

## 2014-09-19 ENCOUNTER — Encounter (HOSPITAL_COMMUNITY): Payer: Self-pay | Admitting: Ophthalmology

## 2014-10-03 ENCOUNTER — Encounter: Payer: Self-pay | Admitting: Cardiology

## 2014-10-24 ENCOUNTER — Telehealth: Payer: Self-pay | Admitting: *Deleted

## 2014-10-24 MED ORDER — DIGOXIN 250 MCG PO TABS: 250.0000 ug | ORAL_TABLET | Freq: Every day | ORAL | Status: DC

## 2014-10-24 NOTE — Telephone Encounter (Signed)
Spoke with Mrs. [name], she stated that [name] PCP would not prescribe his Digoxin anymore since it is considered a "high risk" medication. Consulted [name] Halter, RN to advise. She authorized a 90 day supply per Dr. Lovena Le. Mrs. [name] notified.

## 2014-12-15 ENCOUNTER — Telehealth: Payer: Self-pay | Admitting: Internal Medicine

## 2014-12-15 NOTE — Telephone Encounter (Signed)
Patient asks that I speak with wife. Wife reports "several weeks" of blurred vision. He had one episode of pain at ICD site. Wife wants digoxin level.She states she spoke with Leonia Reader and was told a level would be ordered.I spoke with K.lawrence NP and she did not recommend Dig level,but have patient see pcp regarding blurred vision. I will message Dr Lovena Le to clarify

## 2014-12-15 NOTE — Telephone Encounter (Signed)
New Message    Pt c/o medication issue:  1. Name of Medication: digoxin  2. How are you currently taking this medication (dosage and times per day)? 0.250 mg daily one tablet  3. Are you having a reaction (difficulty breathing--STAT)? Acts confused and blurry vision at times (once wife said he had a pain around arm into his chest)  4. What is your medication issue? For his heart.   Please give patients wife a call she is concerned and needs to know if there is blood work that may be needed to be done.

## 2014-12-17 NOTE — Telephone Encounter (Signed)
Blurred vision can rarely be associated with too much digoxin. Please obtain a digoxin level.

## 2014-12-18 ENCOUNTER — Other Ambulatory Visit: Payer: Self-pay

## 2014-12-18 DIAGNOSIS — Z79899 Other long term (current) drug therapy: Secondary | ICD-10-CM

## 2014-12-19 ENCOUNTER — Telehealth: Payer: Self-pay

## 2014-12-19 ENCOUNTER — Telehealth: Payer: Self-pay | Admitting: Cardiovascular Disease

## 2014-12-19 LAB — DIGOXIN LEVEL: Digoxin Level: 3.28 ng/mL (ref 0.8–2.0)

## 2014-12-19 NOTE — Telephone Encounter (Signed)
Pt already informed to STOP Digoxin and pcp advised

## 2014-12-19 NOTE — Telephone Encounter (Signed)
Spoke with wife,asked to her to have Mr Reasons stop digoxin and to realize it may take a few days before medication clears.If he has any sx's of syncope to go direct to ED   I informed pcp, Dr Woody Seller, and he concurred

## 2014-12-19 NOTE — Telephone Encounter (Signed)
Dig level 3.28 / repeated and verified / tgs

## 2014-12-22 ENCOUNTER — Ambulatory Visit (INDEPENDENT_AMBULATORY_CARE_PROVIDER_SITE_OTHER): Payer: Medicare Other | Admitting: *Deleted

## 2014-12-22 DIAGNOSIS — I5022 Chronic systolic (congestive) heart failure: Secondary | ICD-10-CM

## 2014-12-22 DIAGNOSIS — I472 Ventricular tachycardia, unspecified: Secondary | ICD-10-CM

## 2014-12-22 NOTE — Progress Notes (Signed)
Remote ICD transmission.   

## 2014-12-23 LAB — MDC_IDC_ENUM_SESS_TYPE_REMOTE
Brady Statistic AP VP Percent: 89.81 %
Brady Statistic AP VS Percent: 0.02 %
Brady Statistic AS VP Percent: 10.17 %
Brady Statistic AS VS Percent: 0 %
Brady Statistic RV Percent Paced: 99.98 %
Date Time Interrogation Session: 20160307171642
HIGH POWER IMPEDANCE MEASURED VALUE: 69 Ohm
HighPow Impedance: 399 Ohm
HighPow Impedance: 55 Ohm
Lead Channel Impedance Value: 4047 Ohm
Lead Channel Impedance Value: 4047 Ohm
Lead Channel Impedance Value: 513 Ohm
Lead Channel Impedance Value: 551 Ohm
Lead Channel Impedance Value: 570 Ohm
Lead Channel Pacing Threshold Amplitude: 0.5 V
Lead Channel Pacing Threshold Amplitude: 1.375 V
Lead Channel Pacing Threshold Pulse Width: 0.4 ms
Lead Channel Pacing Threshold Pulse Width: 1 ms
Lead Channel Sensing Intrinsic Amplitude: 12.25 mV
Lead Channel Sensing Intrinsic Amplitude: 12.25 mV
Lead Channel Setting Pacing Amplitude: 2 V
Lead Channel Setting Pacing Amplitude: 2.75 V
Lead Channel Setting Sensing Sensitivity: 0.45 mV
MDC IDC MSMT BATTERY VOLTAGE: 2.94 V
MDC IDC MSMT LEADCHNL LV PACING THRESHOLD AMPLITUDE: 1.5 V
MDC IDC MSMT LEADCHNL RA SENSING INTR AMPL: 4.375 mV
MDC IDC MSMT LEADCHNL RA SENSING INTR AMPL: 4.375 mV
MDC IDC MSMT LEADCHNL RV PACING THRESHOLD PULSEWIDTH: 0.4 ms
MDC IDC SET LEADCHNL LV PACING AMPLITUDE: 2 V
MDC IDC SET LEADCHNL LV PACING PULSEWIDTH: 1 ms
MDC IDC SET LEADCHNL RV PACING PULSEWIDTH: 0.4 ms
MDC IDC SET ZONE DETECTION INTERVAL: 350 ms
MDC IDC STAT BRADY RA PERCENT PACED: 89.83 %
Zone Setting Detection Interval: 300 ms
Zone Setting Detection Interval: 350 ms
Zone Setting Detection Interval: 450 ms

## 2015-01-01 ENCOUNTER — Encounter: Payer: Self-pay | Admitting: Cardiology

## 2015-01-07 ENCOUNTER — Encounter: Payer: Self-pay | Admitting: Internal Medicine

## 2015-03-23 ENCOUNTER — Ambulatory Visit (INDEPENDENT_AMBULATORY_CARE_PROVIDER_SITE_OTHER): Payer: Medicare Other | Admitting: *Deleted

## 2015-03-23 DIAGNOSIS — I472 Ventricular tachycardia, unspecified: Secondary | ICD-10-CM

## 2015-03-23 DIAGNOSIS — I255 Ischemic cardiomyopathy: Secondary | ICD-10-CM | POA: Diagnosis not present

## 2015-03-23 DIAGNOSIS — I5022 Chronic systolic (congestive) heart failure: Secondary | ICD-10-CM | POA: Diagnosis not present

## 2015-03-23 NOTE — Progress Notes (Signed)
Remote ICD transmission.   

## 2015-03-26 LAB — CUP PACEART REMOTE DEVICE CHECK
Battery Voltage: 2.88 V
Brady Statistic AP VP Percent: 84.29 %
Brady Statistic AS VP Percent: 15.7 %
Brady Statistic RA Percent Paced: 84.29 %
Brady Statistic RV Percent Paced: 100 %
Date Time Interrogation Session: 20160606131203
HIGH POWER IMPEDANCE MEASURED VALUE: 55 Ohm
HIGH POWER IMPEDANCE MEASURED VALUE: 65 Ohm
HighPow Impedance: 456 Ohm
Lead Channel Impedance Value: 4047 Ohm
Lead Channel Impedance Value: 551 Ohm
Lead Channel Impedance Value: 646 Ohm
Lead Channel Pacing Threshold Amplitude: 0.625 V
Lead Channel Pacing Threshold Amplitude: 1 V
Lead Channel Pacing Threshold Pulse Width: 0.4 ms
Lead Channel Pacing Threshold Pulse Width: 1 ms
Lead Channel Sensing Intrinsic Amplitude: 13.75 mV
Lead Channel Sensing Intrinsic Amplitude: 13.75 mV
Lead Channel Setting Pacing Amplitude: 2.25 V
Lead Channel Setting Pacing Amplitude: 2.5 V
Lead Channel Setting Pacing Pulse Width: 0.4 ms
Lead Channel Setting Pacing Pulse Width: 1 ms
MDC IDC MSMT LEADCHNL LV IMPEDANCE VALUE: 4047 Ohm
MDC IDC MSMT LEADCHNL LV IMPEDANCE VALUE: 494 Ohm
MDC IDC MSMT LEADCHNL LV PACING THRESHOLD AMPLITUDE: 1.625 V
MDC IDC MSMT LEADCHNL RA SENSING INTR AMPL: 4.25 mV
MDC IDC MSMT LEADCHNL RA SENSING INTR AMPL: 4.25 mV
MDC IDC MSMT LEADCHNL RV PACING THRESHOLD PULSEWIDTH: 0.4 ms
MDC IDC SET LEADCHNL RA PACING AMPLITUDE: 2 V
MDC IDC SET LEADCHNL RV SENSING SENSITIVITY: 0.45 mV
MDC IDC SET ZONE DETECTION INTERVAL: 300 ms
MDC IDC SET ZONE DETECTION INTERVAL: 350 ms
MDC IDC SET ZONE DETECTION INTERVAL: 450 ms
MDC IDC STAT BRADY AP VS PERCENT: 0 %
MDC IDC STAT BRADY AS VS PERCENT: 0 %
Zone Setting Detection Interval: 350 ms

## 2015-04-06 ENCOUNTER — Encounter: Payer: Self-pay | Admitting: Cardiology

## 2015-04-10 ENCOUNTER — Encounter: Payer: Self-pay | Admitting: Internal Medicine

## 2015-06-18 ENCOUNTER — Encounter: Payer: Self-pay | Admitting: Internal Medicine

## 2015-06-18 ENCOUNTER — Encounter (INDEPENDENT_AMBULATORY_CARE_PROVIDER_SITE_OTHER): Payer: Self-pay

## 2015-06-18 ENCOUNTER — Ambulatory Visit (INDEPENDENT_AMBULATORY_CARE_PROVIDER_SITE_OTHER): Payer: Medicare Other | Admitting: Internal Medicine

## 2015-06-18 VITALS — BP 98/64 | HR 129 | Ht 68.0 in | Wt 143.0 lb

## 2015-06-18 DIAGNOSIS — Z95810 Presence of automatic (implantable) cardiac defibrillator: Secondary | ICD-10-CM

## 2015-06-18 DIAGNOSIS — I519 Heart disease, unspecified: Secondary | ICD-10-CM | POA: Diagnosis not present

## 2015-06-18 DIAGNOSIS — I5022 Chronic systolic (congestive) heart failure: Secondary | ICD-10-CM | POA: Diagnosis not present

## 2015-06-18 DIAGNOSIS — I255 Ischemic cardiomyopathy: Secondary | ICD-10-CM | POA: Diagnosis not present

## 2015-06-18 DIAGNOSIS — I472 Ventricular tachycardia, unspecified: Secondary | ICD-10-CM

## 2015-06-18 LAB — CUP PACEART INCLINIC DEVICE CHECK
Battery Voltage: 2.81 V
Brady Statistic AP VS Percent: 0.02 %
Brady Statistic AS VP Percent: 17.61 %
Brady Statistic RA Percent Paced: 82.34 %
HIGH POWER IMPEDANCE MEASURED VALUE: 54 Ohm
HIGH POWER IMPEDANCE MEASURED VALUE: 69 Ohm
HighPow Impedance: 266 Ohm
HighPow Impedance: 437 Ohm
Lead Channel Impedance Value: 4047 Ohm
Lead Channel Impedance Value: 551 Ohm
Lead Channel Pacing Threshold Amplitude: 0.625 V
Lead Channel Pacing Threshold Amplitude: 1.625 V
Lead Channel Pacing Threshold Pulse Width: 0.4 ms
Lead Channel Sensing Intrinsic Amplitude: 12 mV
Lead Channel Sensing Intrinsic Amplitude: 4.125 mV
Lead Channel Sensing Intrinsic Amplitude: 4.5 mV
Lead Channel Setting Pacing Amplitude: 2 V
Lead Channel Setting Pacing Amplitude: 2.75 V
Lead Channel Setting Pacing Pulse Width: 0.4 ms
MDC IDC MSMT LEADCHNL LV IMPEDANCE VALUE: 4047 Ohm
MDC IDC MSMT LEADCHNL LV IMPEDANCE VALUE: 513 Ohm
MDC IDC MSMT LEADCHNL LV PACING THRESHOLD PULSEWIDTH: 1 ms
MDC IDC MSMT LEADCHNL RV IMPEDANCE VALUE: 570 Ohm
MDC IDC MSMT LEADCHNL RV PACING THRESHOLD AMPLITUDE: 1.375 V
MDC IDC MSMT LEADCHNL RV PACING THRESHOLD PULSEWIDTH: 0.4 ms
MDC IDC MSMT LEADCHNL RV SENSING INTR AMPL: 10.5 mV
MDC IDC SESS DTM: 20160901112434
MDC IDC SET LEADCHNL LV PACING AMPLITUDE: 2.25 V
MDC IDC SET LEADCHNL LV PACING PULSEWIDTH: 1 ms
MDC IDC SET LEADCHNL RV SENSING SENSITIVITY: 0.6 mV
MDC IDC SET ZONE DETECTION INTERVAL: 400 ms
MDC IDC SET ZONE DETECTION INTERVAL: 450 ms
MDC IDC STAT BRADY AP VP PERCENT: 82.32 %
MDC IDC STAT BRADY AS VS PERCENT: 0.05 %
MDC IDC STAT BRADY RV PERCENT PACED: 99.92 %
Zone Setting Detection Interval: 300 ms
Zone Setting Detection Interval: 350 ms

## 2015-06-18 NOTE — Assessment & Plan Note (Signed)
He has had no recurrent ventricular arrhythmias. He will continue his current meds.  

## 2015-06-18 NOTE — Assessment & Plan Note (Signed)
He denies anginal symptoms and remains active. He has occaisional days where he feels poorly but mostly is able to do whatever he feels like.

## 2015-06-18 NOTE — Progress Notes (Signed)
HPI [name] returns today for followup. He is a pleasant 74 yo man with a h/o an ICM, chronic systolic CHF, s/p BiV ICD implant. He has done well in the interim. He denies chest pain or sob. His appetite is still not great but no weight loss. He tries to stay active.  No edema. No ICD shock.  No Known Allergies   Current Outpatient Prescriptions  Medication Sig Dispense Refill  . aspirin 81 MG tablet Take 81 mg by mouth daily. 2 po daily     . carvedilol (COREG) 25 MG tablet Take 1 tablet (25 mg total) by mouth 2 (two) times daily with a meal. 180 tablet 3  . eplerenone (INSPRA) 25 MG tablet Take 1 tablet (25 mg total) by mouth daily. 90 tablet 3  . furosemide (LASIX) 20 MG tablet Take 1 tablet (20 mg total) by mouth daily. 90 tablet 3  . levothyroxine (SYNTHROID, LEVOTHROID) 112 MCG tablet Take 112 mcg by mouth daily before breakfast.    . niacin 500 MG tablet Take 500 mg by mouth daily with breakfast.      . pantoprazole (PROTONIX) 40 MG tablet Take 1 tablet by mouth Daily.    . simvastatin (ZOCOR) 20 MG tablet Take 1 tablet (20 mg total) by mouth at bedtime. 90 tablet 3   No current facility-administered medications for this visit.     Past Medical History  Diagnosis Date  . Ischemic cardiomyopathy      status post non-ST elevation microinfarction stent to the right coronary artery is a   . LV dysfunction      NYHA class I/prior ejection fraction 20-25% and an ejection fraction 7 2009 50-55%, ejection fraction 60% bedside echocardiogram July 2012   . Ventricular tachycardia     Inducible ventricular polymorphic tachycardia status post ICD followed by upgrade with CRT-D 2007, battery change at 01/31/2011  . Drug-induced gynecomastia      secondary to spironolactone  . Carotid artery disease      less than 50% stenosis bilaterally  . Aortic insufficiency      moderate aortic ins moderate/asymptomatic/normal LV cavity size.   Marland Kitchen Dyslipidemia   . Pulmonary embolism     Prior  history of pulmonary embolism off Coumadin.  . ICD (implantable cardiac defibrillator), biventricular, in situ      Medtronic model  [id number]  . AICD (automatic cardioverter/defibrillator) present   . PONV (postoperative nausea and vomiting)     ROS:   All systems reviewed and negative except as noted in the HPI.   Past Surgical History  Procedure Laterality Date  . Thoracotomy      left  . Coronary stent placement    . Retropubic prostatectomy    . Inguinal hernia repair    . Cardiac defibrillator placement      medtronic, remote - yes  . Left breast biopsy    . Cardiac catheterization    . Thyroidectomy    . Cataract extraction w/phaco Left 08/25/2014    Procedure: CATARACT EXTRACTION PHACO AND INTRAOCULAR LENS PLACEMENT (IOC);  Surgeon: Tonny Branch, MD;  Location: [facility];  Service: Ophthalmology;  Laterality: Left;  CDE 5.79  . Cataract extraction w/phaco Right 09/18/2014    Procedure: CATARACT EXTRACTION PHACO AND INTRAOCULAR LENS PLACEMENT RIGHT EYE;  Surgeon: Tonny Branch, MD;  Location: [facility];  Service: Ophthalmology;  Laterality: Right;  CDE:3.35     Family History  Problem Relation Age of Onset  . Diabetes type I Sister  daughter   . Clotting disorder Neg Hx     also no repiratory disease   . Prostate cancer Neg Hx      Social History   Social History  . Marital Status: Married    Spouse Name: N/A  . Number of Children: 2  . Years of Education: N/A   Occupational History  . plumber - self employed    Social History Main Topics  . Smoking status: Former Smoker    Types: Cigarettes    Quit date: 06/16/2009  . Smokeless tobacco: Never Used  . Alcohol Use: No  . Drug Use: No  . Sexual Activity: Not on file   Other Topics Concern  . Not on file   Social History Narrative   Self employed Development worker, community; 2 daughters.      BP 98/64 mmHg  Pulse 129  Ht 5\' 8"  (1.727 m)  Wt 143 lb (64.864 kg)  BMI 21.75 kg/m2  SpO2 98%  Physical Exam:  Well appearing  74 yo man, NAD HEENT: Unremarkable Neck:  6 cm JVD, no thyromegally Back:  No CVA tenderness Lungs:  Clear with no wheezes, well healed ICD incision. HEART:  Regular rate rhythm, no murmurs, no rubs, no clicks Abd:  soft, positive bowel sounds, no organomegally, no rebound, no guarding Ext:  2 plus pulses, no edema, no cyanosis, no clubbing Skin:  No rashes no nodules Neuro:  CN II through XII intact, motor grossly intact   DEVICE  Normal device function.  See PaceArt for details.   Assess/Plan:

## 2015-06-18 NOTE — Assessment & Plan Note (Signed)
His medtronic Biv ICD is working [employer]. Will recheck in several months.

## 2015-06-18 NOTE — Patient Instructions (Signed)
Your physician wants you to follow-up in: 12 months with Dr. Lovena Le. You will receive a reminder letter in the mail two months in advance. If you don't receive a letter, please call our office to schedule the follow-up appointment.  Remote monitoring is used to monitor your Pacemaker of ICD from home. This monitoring reduces the number of office visits required to check your device to one time per year. It allows [place] to keep an eye on the functioning of your device to ensure it is working properly. You are scheduled for a device check from home on 09/17/15. You may send your transmission at any time that day. If you have a wireless device, the transmission will be sent automatically. After your physician reviews your transmission, you will receive a postcard with your next transmission date.  Your physician recommends that you continue on your current medications as directed. Please refer to the Current Medication list given to you today.  Thank you for choosing [facility]!

## 2015-09-17 ENCOUNTER — Ambulatory Visit (INDEPENDENT_AMBULATORY_CARE_PROVIDER_SITE_OTHER): Payer: Medicare Other | Admitting: *Deleted

## 2015-09-17 DIAGNOSIS — I5022 Chronic systolic (congestive) heart failure: Secondary | ICD-10-CM

## 2015-09-17 DIAGNOSIS — I255 Ischemic cardiomyopathy: Secondary | ICD-10-CM | POA: Diagnosis not present

## 2015-09-18 NOTE — Progress Notes (Signed)
Remote ICD transmission.   

## 2015-09-28 LAB — CUP PACEART REMOTE DEVICE CHECK
Brady Statistic AS VP Percent: 32.69 %
Brady Statistic RA Percent Paced: 66.95 %
HighPow Impedance: 456 Ohm
HighPow Impedance: 53 Ohm
HighPow Impedance: 74 Ohm
Implantable Lead Implant Date: 20050810
Implantable Lead Implant Date: 20071015
Implantable Lead Location: 753858
Implantable Lead Location: 753860
Implantable Lead Model: 5076
Lead Channel Impedance Value: 4047 Ohm
Lead Channel Impedance Value: 513 Ohm
Lead Channel Impedance Value: 608 Ohm
Lead Channel Pacing Threshold Amplitude: 0.5 V
Lead Channel Pacing Threshold Pulse Width: 0.4 ms
Lead Channel Pacing Threshold Pulse Width: 0.4 ms
Lead Channel Pacing Threshold Pulse Width: 1 ms
Lead Channel Sensing Intrinsic Amplitude: 4.5 mV
Lead Channel Sensing Intrinsic Amplitude: 4.5 mV
Lead Channel Setting Pacing Amplitude: 2 V
Lead Channel Setting Pacing Pulse Width: 0.4 ms
Lead Channel Setting Sensing Sensitivity: 0.6 mV
MDC IDC LEAD IMPLANT DT: 20050810
MDC IDC LEAD LOCATION: 753859
MDC IDC LEAD MODEL: 4194
MDC IDC LEAD MODEL: 6949
MDC IDC MSMT BATTERY VOLTAGE: 2.7 V
MDC IDC MSMT LEADCHNL LV IMPEDANCE VALUE: 4047 Ohm
MDC IDC MSMT LEADCHNL LV PACING THRESHOLD AMPLITUDE: 1.625 V
MDC IDC MSMT LEADCHNL RA IMPEDANCE VALUE: 551 Ohm
MDC IDC MSMT LEADCHNL RV PACING THRESHOLD AMPLITUDE: 1.125 V
MDC IDC MSMT LEADCHNL RV SENSING INTR AMPL: 13.25 mV
MDC IDC MSMT LEADCHNL RV SENSING INTR AMPL: 13.25 mV
MDC IDC SESS DTM: 20161201052207
MDC IDC SET LEADCHNL LV PACING AMPLITUDE: 2.25 V
MDC IDC SET LEADCHNL LV PACING PULSEWIDTH: 1 ms
MDC IDC SET LEADCHNL RV PACING AMPLITUDE: 2.5 V
MDC IDC STAT BRADY AP VP PERCENT: 66.87 %
MDC IDC STAT BRADY AP VS PERCENT: 0.08 %
MDC IDC STAT BRADY AS VS PERCENT: 0.36 %
MDC IDC STAT BRADY RV PERCENT PACED: 99.56 %

## 2015-09-29 ENCOUNTER — Encounter: Payer: Self-pay | Admitting: Cardiology

## 2015-10-06 ENCOUNTER — Other Ambulatory Visit: Payer: Self-pay

## 2015-10-06 ENCOUNTER — Ambulatory Visit (INDEPENDENT_AMBULATORY_CARE_PROVIDER_SITE_OTHER): Payer: Medicare Other | Admitting: Internal Medicine

## 2015-10-06 ENCOUNTER — Encounter: Payer: Self-pay | Admitting: Internal Medicine

## 2015-10-06 VITALS — BP 136/74 | HR 78 | Ht 69.0 in | Wt 143.8 lb

## 2015-10-06 DIAGNOSIS — I5022 Chronic systolic (congestive) heart failure: Secondary | ICD-10-CM | POA: Diagnosis not present

## 2015-10-06 DIAGNOSIS — I472 Ventricular tachycardia, unspecified: Secondary | ICD-10-CM

## 2015-10-06 DIAGNOSIS — I471 Supraventricular tachycardia: Secondary | ICD-10-CM | POA: Insufficient documentation

## 2015-10-06 DIAGNOSIS — I255 Ischemic cardiomyopathy: Secondary | ICD-10-CM | POA: Diagnosis not present

## 2015-10-06 DIAGNOSIS — Z95810 Presence of automatic (implantable) cardiac defibrillator: Secondary | ICD-10-CM

## 2015-10-06 LAB — CUP PACEART INCLINIC DEVICE CHECK
Battery Voltage: 2.68 V
Brady Statistic AP VS Percent: 0.09 %
Brady Statistic AS VS Percent: 0.5 %
Brady Statistic RV Percent Paced: 99.41 %
HIGH POWER IMPEDANCE MEASURED VALUE: 437 Ohm
HIGH POWER IMPEDANCE MEASURED VALUE: 76 Ohm
HighPow Impedance: 55 Ohm
Implantable Lead Implant Date: 20050810
Implantable Lead Location: 753858
Implantable Lead Location: 753859
Implantable Lead Location: 753860
Implantable Lead Model: 6949
Lead Channel Impedance Value: 4047 Ohm
Lead Channel Impedance Value: 4047 Ohm
Lead Channel Impedance Value: 513 Ohm
Lead Channel Impedance Value: 551 Ohm
Lead Channel Impedance Value: 551 Ohm
Lead Channel Pacing Threshold Amplitude: 0.625 V
Lead Channel Pacing Threshold Amplitude: 1.25 V
Lead Channel Pacing Threshold Amplitude: 1.5 V
Lead Channel Pacing Threshold Pulse Width: 0.4 ms
Lead Channel Pacing Threshold Pulse Width: 0.4 ms
Lead Channel Pacing Threshold Pulse Width: 1 ms
Lead Channel Sensing Intrinsic Amplitude: 5.125 mV
Lead Channel Sensing Intrinsic Amplitude: 9.125 mV
Lead Channel Setting Pacing Amplitude: 2 V
Lead Channel Setting Pacing Amplitude: 3 V
Lead Channel Setting Pacing Pulse Width: 0.4 ms
Lead Channel Setting Pacing Pulse Width: 1 ms
MDC IDC LEAD IMPLANT DT: 20050810
MDC IDC LEAD IMPLANT DT: 20071015
MDC IDC LEAD MODEL: 4194
MDC IDC SESS DTM: 20161220152934
MDC IDC SET LEADCHNL LV PACING AMPLITUDE: 2 V
MDC IDC SET LEADCHNL RV SENSING SENSITIVITY: 0.6 mV
MDC IDC STAT BRADY AP VP PERCENT: 65.39 %
MDC IDC STAT BRADY AS VP PERCENT: 34.02 %
MDC IDC STAT BRADY RA PERCENT PACED: 65.48 %

## 2015-10-06 NOTE — Assessment & Plan Note (Signed)
He denies anginal symptoms. Will follow. 

## 2015-10-06 NOTE — Assessment & Plan Note (Signed)
He has had no sustained ventricular arrhythmias since his ICD has been placed. We will consider placing a BiV PM when he reaches ERI.

## 2015-10-06 NOTE — Assessment & Plan Note (Signed)
His symptoms are class 2B. His fluid index is up. I have asked that he not miss his medications. He will take double dose of his lasix.

## 2015-10-06 NOTE — Assessment & Plan Note (Signed)
His BiV device is working normally. He has his LV lead in the RV port.

## 2015-10-06 NOTE — Assessment & Plan Note (Signed)
He mode switched for up to 2 hours. His rhythm is not atrial fib and rate is such that systemic anti-coagulation will not be necessary.

## 2015-10-06 NOTE — Progress Notes (Signed)
HPI [name] returns today for followup. He is a pleasant 74 yo man with a h/o an ICM, chronic systolic CHF, s/p BiV ICD implant. He has done well in the interim. He denies chest pain or sob. His appetite is still not great but no weight loss. He tries to stay active.  No edema. No ICD shock. He admits to some dietary non-compliance with lasix. He has days where he feels more sob. He denies palpitations.  No Known Allergies   Current Outpatient Prescriptions  Medication Sig Dispense Refill  . aspirin 81 MG tablet Take 81 mg by mouth daily. 2 po daily     . carvedilol (COREG) 25 MG tablet Take 1 tablet (25 mg total) by mouth 2 (two) times daily with a meal. 180 tablet 3  . eplerenone (INSPRA) 25 MG tablet Take 1 tablet (25 mg total) by mouth daily. 90 tablet 3  . Fluticasone Furoate-Vilanterol (BREO ELLIPTA) 100-25 MCG/INH AEPB Inhale 1 puff into the lungs daily.    . furosemide (LASIX) 20 MG tablet Take 1 tablet (20 mg total) by mouth daily. 90 tablet 3  . levothyroxine (SYNTHROID, LEVOTHROID) 112 MCG tablet Take 112 mcg by mouth daily before breakfast.    . niacin 500 MG tablet Take 500 mg by mouth daily with breakfast.      . omeprazole (PRILOSEC) 40 MG capsule Take 40 mg by mouth daily.    . simvastatin (ZOCOR) 20 MG tablet Take 1 tablet (20 mg total) by mouth at bedtime. 90 tablet 3   No current facility-administered medications for this visit.     Past Medical History  Diagnosis Date  . Ischemic cardiomyopathy      status post non-ST elevation microinfarction stent to the right coronary artery is a   . LV dysfunction      NYHA class I/prior ejection fraction 20-25% and an ejection fraction 7 2009 50-55%, ejection fraction 60% bedside echocardiogram July 2012   . Ventricular tachycardia ([place])     Inducible ventricular polymorphic tachycardia status post ICD followed by upgrade with CRT-D 2007, battery change at 01/31/2011  . Drug-induced gynecomastia      secondary to  spironolactone  . Carotid artery disease (HCC)      less than 50% stenosis bilaterally  . Aortic insufficiency      moderate aortic ins moderate/asymptomatic/normal LV cavity size.   Marland Kitchen Dyslipidemia   . Pulmonary embolism (HCC)     Prior history of pulmonary embolism off Coumadin.  . ICD (implantable cardiac defibrillator), biventricular, in situ      Medtronic model  [id number]  . AICD (automatic cardioverter/defibrillator) present   . PONV (postoperative nausea and vomiting)     ROS:   All systems reviewed and negative except as noted in the HPI.   Past Surgical History  Procedure Laterality Date  . Thoracotomy      left  . Coronary stent placement    . Retropubic prostatectomy    . Inguinal hernia repair    . Cardiac defibrillator placement      medtronic, remote - yes  . Left breast biopsy    . Cardiac catheterization    . Thyroidectomy    . Cataract extraction w/phaco Left 08/25/2014    Procedure: CATARACT EXTRACTION PHACO AND INTRAOCULAR LENS PLACEMENT (IOC);  Surgeon: Tonny Branch, MD;  Location: [facility];  Service: Ophthalmology;  Laterality: Left;  CDE 5.79  . Cataract extraction w/phaco Right 09/18/2014    Procedure: CATARACT EXTRACTION PHACO AND INTRAOCULAR  LENS PLACEMENT RIGHT EYE;  Surgeon: Tonny Branch, MD;  Location: [facility];  Service: Ophthalmology;  Laterality: Right;  CDE:3.35     Family History  Problem Relation Age of Onset  . Diabetes type I Sister     daughter   . Clotting disorder Neg Hx     also no repiratory disease   . Prostate cancer Neg Hx      Social History   Social History  . Marital Status: Married    Spouse Name: N/A  . Number of Children: 2  . Years of Education: N/A   Occupational History  . plumber - self employed    Social History Main Topics  . Smoking status: Former Smoker    Types: Cigarettes    Quit date: 06/16/2009  . Smokeless tobacco: Never Used  . Alcohol Use: No  . Drug Use: No  . Sexual Activity: Not on file   Other  Topics Concern  . Not on file   Social History Narrative   Self employed Development worker, community; 2 daughters.      BP 136/74 mmHg  Pulse 78  Ht 5\' 9"  (1.753 m)  Wt 143 lb 12.8 oz (65.227 kg)  BMI 21.23 kg/m2  Physical Exam:  Well appearing 75 yo man, NAD HEENT: Unremarkable Neck:  6 cm JVD, no thyromegally Back:  No CVA tenderness Lungs:  Clear with no wheezes, well healed ICD incision. HEART:  Regular rate rhythm, no murmurs, no rubs, no clicks Abd:  soft, positive bowel sounds, no organomegally, no rebound, no guarding Ext:  2 plus pulses, no edema, no cyanosis, no clubbing Skin:  No rashes no nodules Neuro:  CN II through XII intact, motor grossly intact   DEVICE  Normal device function.  See PaceArt for details.   Assess/Plan:

## 2015-10-06 NOTE — Patient Instructions (Addendum)
Medication Instructions:  Your physician has recommended you make the following change in your medication:  1) Double your Furosemide for 3 days--take 40mg    Labwork: None ordered   Testing/Procedures: None ordered   Follow-Up: Remote monitoring is used to monitor your  ICD from home. This monitoring reduces the number of office visits required to check your device to one time per year. It allows [place] to keep an eye on the functioning of your device to ensure it is working properly. You are scheduled for a device check from home on 01/05/16. You may send your transmission at any time that day. If you have a wireless device, the transmission will be sent automatically. After your physician reviews your transmission, you will receive a postcard with your next transmission date.  Your physician wants you to follow-up in: 12 months with Dr Knox Saliva will receive a reminder letter in the mail two months in advance. If you don't receive a letter, please call our office to schedule the follow-up appointment.     Any Other Special Instructions Will Be Listed Below (If Applicable).     If you need a refill on your cardiac medications before your next appointment, please call your pharmacy.

## 2015-10-21 ENCOUNTER — Telehealth: Payer: Self-pay | Admitting: Internal Medicine

## 2015-10-21 DIAGNOSIS — Z6822 Body mass index (BMI) 22.0-22.9, adult: Secondary | ICD-10-CM | POA: Diagnosis not present

## 2015-10-21 DIAGNOSIS — Z7189 Other specified counseling: Secondary | ICD-10-CM | POA: Diagnosis not present

## 2015-10-21 DIAGNOSIS — Z1389 Encounter for screening for other disorder: Secondary | ICD-10-CM | POA: Diagnosis not present

## 2015-10-21 DIAGNOSIS — E7800 Pure hypercholesterolemia, unspecified: Secondary | ICD-10-CM | POA: Diagnosis not present

## 2015-10-21 DIAGNOSIS — Z418 Encounter for other procedures for purposes other than remedying health state: Secondary | ICD-10-CM | POA: Diagnosis not present

## 2015-10-21 DIAGNOSIS — E039 Hypothyroidism, unspecified: Secondary | ICD-10-CM | POA: Diagnosis not present

## 2015-10-21 DIAGNOSIS — Z0000 Encounter for general adult medical examination without abnormal findings: Secondary | ICD-10-CM | POA: Diagnosis not present

## 2015-10-21 DIAGNOSIS — Z79899 Other long term (current) drug therapy: Secondary | ICD-10-CM | POA: Diagnosis not present

## 2015-10-21 DIAGNOSIS — Z125 Encounter for screening for malignant neoplasm of prostate: Secondary | ICD-10-CM | POA: Diagnosis not present

## 2015-10-21 DIAGNOSIS — Z1211 Encounter for screening for malignant neoplasm of colon: Secondary | ICD-10-CM | POA: Diagnosis not present

## 2015-10-21 NOTE — Telephone Encounter (Signed)
Pt c/o Shortness Of Breath: STAT if SOB developed within the last 24 hours or pt is noticeably SOB on the phone  1. Are you currently SOB (can you hear that pt is SOB on the phone)? THE WIFE IS ON THE PHONE  2. How long have you been experiencing SOB? 10/17/15  3. Are you SOB when sitting or when up moving around? BOTH  4. Are you currently experiencing any other symptoms? NO

## 2015-10-21 NOTE — Telephone Encounter (Signed)
The SOB is worse than when he was here.  Started with a cough on Sat.  Went to his PCP today and says his lungs were clear.  Although I could hear him coughing in the background.  He is still taking the increased dose of furosemide but just does not seem to be any better.

## 2015-10-23 NOTE — Telephone Encounter (Signed)
Spoke with patient's wife and he is some better.  However, his daughter is a Marine scientist and feels it is his heart.  He has had orthopnea and some intermittent dizziness.  He saw Dr Woody Seller a few weeks ago and again yesterday.  Per wife was given an antibiotic for cough.  They did EKG, CXR and labs.  Will have these records requested and I have put him on to see Dr Lovena Le next week.  If his symptoms worsen she will take him to the ER.  She verbalized understanding and agrees with plan of care.

## 2015-10-30 ENCOUNTER — Other Ambulatory Visit: Payer: Self-pay

## 2015-10-30 ENCOUNTER — Encounter: Payer: Self-pay | Admitting: Internal Medicine

## 2015-10-30 ENCOUNTER — Ambulatory Visit (INDEPENDENT_AMBULATORY_CARE_PROVIDER_SITE_OTHER): Payer: Medicare Other | Admitting: Internal Medicine

## 2015-10-30 VITALS — BP 160/80 | HR 80 | Ht 69.0 in | Wt 143.6 lb

## 2015-10-30 DIAGNOSIS — I5022 Chronic systolic (congestive) heart failure: Secondary | ICD-10-CM | POA: Diagnosis not present

## 2015-10-30 DIAGNOSIS — Z95810 Presence of automatic (implantable) cardiac defibrillator: Secondary | ICD-10-CM

## 2015-10-30 DIAGNOSIS — R0602 Shortness of breath: Secondary | ICD-10-CM | POA: Diagnosis not present

## 2015-10-30 DIAGNOSIS — I472 Ventricular tachycardia, unspecified: Secondary | ICD-10-CM

## 2015-10-30 DIAGNOSIS — I519 Heart disease, unspecified: Secondary | ICD-10-CM | POA: Diagnosis not present

## 2015-10-30 DIAGNOSIS — I255 Ischemic cardiomyopathy: Secondary | ICD-10-CM | POA: Diagnosis not present

## 2015-10-30 LAB — CUP PACEART INCLINIC DEVICE CHECK
Brady Statistic AP VP Percent: 61.27 %
Brady Statistic AP VS Percent: 0.08 %
Brady Statistic AS VP Percent: 37.22 %
Brady Statistic RV Percent Paced: 98.49 %
HIGH POWER IMPEDANCE MEASURED VALUE: 437 Ohm
HighPow Impedance: 54 Ohm
HighPow Impedance: 69 Ohm
Implantable Lead Implant Date: 20050810
Implantable Lead Implant Date: 20050810
Implantable Lead Implant Date: 20071015
Implantable Lead Location: 753858
Implantable Lead Location: 753859
Implantable Lead Location: 753860
Implantable Lead Model: 4194
Implantable Lead Model: 5076
Implantable Lead Model: 6949
Lead Channel Impedance Value: 4047 Ohm
Lead Channel Impedance Value: 4047 Ohm
Lead Channel Impedance Value: 513 Ohm
Lead Channel Pacing Threshold Amplitude: 0.5 V
Lead Channel Sensing Intrinsic Amplitude: 11.125 mV
Lead Channel Sensing Intrinsic Amplitude: 5.25 mV
Lead Channel Setting Pacing Amplitude: 2.5 V
Lead Channel Setting Pacing Pulse Width: 0.4 ms
Lead Channel Setting Sensing Sensitivity: 0.6 mV
MDC IDC MSMT BATTERY VOLTAGE: 2.65 V
MDC IDC MSMT LEADCHNL LV IMPEDANCE VALUE: 494 Ohm
MDC IDC MSMT LEADCHNL LV PACING THRESHOLD AMPLITUDE: 1.5 V
MDC IDC MSMT LEADCHNL LV PACING THRESHOLD PULSEWIDTH: 1 ms
MDC IDC MSMT LEADCHNL RA PACING THRESHOLD PULSEWIDTH: 0.4 ms
MDC IDC MSMT LEADCHNL RV IMPEDANCE VALUE: 570 Ohm
MDC IDC MSMT LEADCHNL RV PACING THRESHOLD AMPLITUDE: 1 V
MDC IDC MSMT LEADCHNL RV PACING THRESHOLD PULSEWIDTH: 0.4 ms
MDC IDC SESS DTM: 20170113121620
MDC IDC SET LEADCHNL LV PACING AMPLITUDE: 2.25 V
MDC IDC SET LEADCHNL LV PACING PULSEWIDTH: 1 ms
MDC IDC SET LEADCHNL RA PACING AMPLITUDE: 2 V
MDC IDC STAT BRADY AS VS PERCENT: 1.43 %
MDC IDC STAT BRADY RA PERCENT PACED: 61.35 %

## 2015-10-30 MED ORDER — FUROSEMIDE 20 MG PO TABS: 60.0000 mg | ORAL_TABLET | Freq: Every day | ORAL | Status: DC

## 2015-10-30 NOTE — Assessment & Plan Note (Signed)
He has occaissional episodes of chest pain which are not typically reproducible. We might consider obtaining either a stress test or even cardiac cath but I would prefer to defer to his heart failure team.

## 2015-10-30 NOTE — Assessment & Plan Note (Signed)
His symptoms have worsened. I have asked him to increase his lasix to 60 mg daily, and I will refer him to our advanced heart failure clinic.

## 2015-10-30 NOTE — Assessment & Plan Note (Signed)
His device appears to be working normally. Thresholds are stable.

## 2015-10-30 NOTE — Assessment & Plan Note (Signed)
His VT has been quiet. No change in meds.

## 2015-10-30 NOTE — Progress Notes (Signed)
HPI [name] returns today for followup. He is a pleasant 75 yo man with a h/o an ICM, chronic systolic CHF, s/p BiV ICD implant. He has had severe LV dysfunction since 2005 but did have improvement in 2009. His QRS duration has gradually increased over the past 6 years. He c/o feeling fatigued, sob, and weak. At other times, he will have some chest pressure. Unclear whether exertion related or not. He is sedentary. He has not had an ICD shock. He denies medical or dietary compliance issues.     No Known Allergies   Current Outpatient Prescriptions  Medication Sig Dispense Refill  . aspirin 81 MG tablet Take 81 mg by mouth daily. 2 po daily     . carvedilol (COREG) 25 MG tablet Take 1 tablet (25 mg total) by mouth 2 (two) times daily with a meal. 180 tablet 3  . eplerenone (INSPRA) 25 MG tablet Take 1 tablet (25 mg total) by mouth daily. 90 tablet 3  . Fluticasone Furoate-Vilanterol (BREO ELLIPTA) 100-25 MCG/INH AEPB Inhale 1 puff into the lungs daily.    . furosemide (LASIX) 20 MG tablet Take 3 tablets (60 mg total) by mouth daily. 90 tablet 3  . levothyroxine (SYNTHROID, LEVOTHROID) 112 MCG tablet Take 112 mcg by mouth daily before breakfast.    . niacin 500 MG tablet Take 500 mg by mouth daily with breakfast.      . omeprazole (PRILOSEC) 40 MG capsule Take 40 mg by mouth daily.    . simvastatin (ZOCOR) 20 MG tablet Take 1 tablet (20 mg total) by mouth at bedtime. 90 tablet 3   No current facility-administered medications for this visit.     Past Medical History  Diagnosis Date  . Ischemic cardiomyopathy      status post non-ST elevation microinfarction stent to the right coronary artery is a   . LV dysfunction      NYHA class I/prior ejection fraction 20-25% and an ejection fraction 7 2009 50-55%, ejection fraction 60% bedside echocardiogram July 2012   . Ventricular tachycardia ([place])     Inducible ventricular polymorphic tachycardia status post ICD followed by upgrade with CRT-D  2007, battery change at 01/31/2011  . Drug-induced gynecomastia      secondary to spironolactone  . Carotid artery disease (HCC)      less than 50% stenosis bilaterally  . Aortic insufficiency      moderate aortic ins moderate/asymptomatic/normal LV cavity size.   Marland Kitchen Dyslipidemia   . Pulmonary embolism (HCC)     Prior history of pulmonary embolism off Coumadin.  . ICD (implantable cardiac defibrillator), biventricular, in situ      Medtronic model  D314TRG  . AICD (automatic cardioverter/defibrillator) present   . PONV (postoperative nausea and vomiting)     ROS:   All systems reviewed and negative except as noted in the HPI.   Past Surgical History  Procedure Laterality Date  . Thoracotomy      left  . Coronary stent placement    . Retropubic prostatectomy    . Inguinal hernia repair    . Cardiac defibrillator placement      medtronic, remote - yes  . Left breast biopsy    . Cardiac catheterization    . Thyroidectomy    . Cataract extraction w/phaco Left 08/25/2014    Procedure: CATARACT EXTRACTION PHACO AND INTRAOCULAR LENS PLACEMENT (IOC);  Surgeon: Tonny Branch, MD;  Location: [facility];  Service: Ophthalmology;  Laterality: Left;  CDE 5.79  .  Cataract extraction w/phaco Right 09/18/2014    Procedure: CATARACT EXTRACTION PHACO AND INTRAOCULAR LENS PLACEMENT RIGHT EYE;  Surgeon: Tonny Branch, MD;  Location: [facility];  Service: Ophthalmology;  Laterality: Right;  CDE:3.35     Family History  Problem Relation Age of Onset  . Diabetes type I Sister     daughter   . Clotting disorder Neg Hx     also no repiratory disease   . Prostate cancer Neg Hx      Social History   Social History  . Marital Status: Married    Spouse Name: N/A  . Number of Children: 2  . Years of Education: N/A   Occupational History  . plumber - self employed    Social History Main Topics  . Smoking status: Former Smoker    Types: Cigarettes    Quit date: 06/16/2009  . Smokeless tobacco: Never  Used  . Alcohol Use: No  . Drug Use: No  . Sexual Activity: Not on file   Other Topics Concern  . Not on file   Social History Narrative   Self employed Development worker, community; 2 daughters.      BP 160/80 mmHg  Pulse 80  Ht 5\' 9"  (1.753 m)  Wt 143 lb 9.6 oz (65.137 kg)  BMI 21.20 kg/m2  Physical Exam:  Chronically ill appearing 75 yo man, NAD HEENT: Unremarkable Neck:  6 cm JVD, no thyromegally Back:  No CVA tenderness Lungs:  Clear with no wheezes, well healed ICD incision. HEART:  Regular rate rhythm, no murmurs, no rubs, no clicks Abd:  soft, positive bowel sounds, no organomegally, no rebound, no guarding Ext:  2 plus pulses, no edema, no cyanosis, no clubbing Skin:  No rashes no nodules Neuro:  CN II through XII intact, motor grossly intact   DEVICE  Normal device function.  See PaceArt for details.   Assess/Plan:

## 2015-10-30 NOTE — Patient Instructions (Addendum)
Medication Instructions:  Your physician has recommended you make the following change in your medication:  1) INCREASE Lasix to 60 mg by mouth daily    Labwork: Your physician recommends that you return for lab work in: 7-10 DAYS (BMET)   Testing/Procedures: Your physician has requested that you have an echocardiogram. Echocardiography is a painless test that uses sound waves to create images of your heart. It provides your doctor with information about the size and shape of your heart and how well your heart's chambers and valves are working. This procedure takes approximately one hour. There are no restrictions for this procedure. SCHEDULE IN 7-10 DAYS   Follow-Up: You have been referred to Heart Failure Clinic  Remote monitoring is used to monitor your Pacemaker or ICD from home. This monitoring reduces the number of office visits required to check your device to one time per year. It allows [place] to keep an eye on the functioning of your device to ensure it is working properly. You are scheduled for a device check from home on 02/01/16. You may send your transmission at any time that day. If you have a wireless device, the transmission will be sent automatically. After your physician reviews your transmission, you will receive a postcard with your next transmission date.   Any Other Special Instructions Will Be Listed Below (If Applicable).     If you need a refill on your cardiac medications before your next appointment, please call your pharmacy.

## 2015-11-06 ENCOUNTER — Telehealth: Payer: Self-pay | Admitting: Internal Medicine

## 2015-11-06 NOTE — Telephone Encounter (Signed)
New Message  Pt wife calling to follow up on referral from Dr Lovena Le to [facility] clinic per last OV- no ref in system. Please call back and discuss.

## 2015-11-06 NOTE — Telephone Encounter (Signed)
Spoke with [name] a message was sent to CHF clinic on 10/30/15 when patient checked out  She will send another message today Advised wife another message sent and to call back Tuesday if she has not heard from CHF clinic

## 2015-11-10 ENCOUNTER — Other Ambulatory Visit (INDEPENDENT_AMBULATORY_CARE_PROVIDER_SITE_OTHER): Payer: Medicare Other

## 2015-11-10 ENCOUNTER — Telehealth: Payer: Self-pay | Admitting: Internal Medicine

## 2015-11-10 ENCOUNTER — Ambulatory Visit (HOSPITAL_COMMUNITY): Payer: Medicare Other | Attending: Cardiovascular Disease

## 2015-11-10 ENCOUNTER — Other Ambulatory Visit: Payer: Self-pay

## 2015-11-10 DIAGNOSIS — R0602 Shortness of breath: Secondary | ICD-10-CM | POA: Diagnosis not present

## 2015-11-10 DIAGNOSIS — I517 Cardiomegaly: Secondary | ICD-10-CM | POA: Diagnosis not present

## 2015-11-10 DIAGNOSIS — I2589 Other forms of chronic ischemic heart disease: Secondary | ICD-10-CM | POA: Diagnosis not present

## 2015-11-10 DIAGNOSIS — I351 Nonrheumatic aortic (valve) insufficiency: Secondary | ICD-10-CM | POA: Insufficient documentation

## 2015-11-10 DIAGNOSIS — I371 Nonrheumatic pulmonary valve insufficiency: Secondary | ICD-10-CM | POA: Insufficient documentation

## 2015-11-10 DIAGNOSIS — I255 Ischemic cardiomyopathy: Secondary | ICD-10-CM | POA: Diagnosis not present

## 2015-11-10 DIAGNOSIS — E785 Hyperlipidemia, unspecified: Secondary | ICD-10-CM | POA: Diagnosis not present

## 2015-11-10 DIAGNOSIS — I519 Heart disease, unspecified: Secondary | ICD-10-CM | POA: Diagnosis not present

## 2015-11-10 LAB — BASIC METABOLIC PANEL
BUN: 14 mg/dL (ref 7–25)
CO2: 34 mmol/L — ABNORMAL HIGH (ref 20–31)
Calcium: 9.2 mg/dL (ref 8.6–10.3)
Chloride: 99 mmol/L (ref 98–110)
Creat: 1.05 mg/dL (ref 0.70–1.18)
GLUCOSE: 74 mg/dL (ref 65–99)
POTASSIUM: 3.8 mmol/L (ref 3.5–5.3)
SODIUM: 140 mmol/L (ref 135–146)

## 2015-11-10 NOTE — Addendum Note (Signed)
Addended by: Velna Ochs on: 11/10/2015 01:23 PM   Modules accepted: Orders

## 2015-11-10 NOTE — Telephone Encounter (Signed)
Walk in pt form-pt needs call back about Heart Failure [place] back Wednesday 11/11/15

## 2015-11-13 ENCOUNTER — Telehealth: Payer: Self-pay | Admitting: *Deleted

## 2015-11-13 DIAGNOSIS — R0602 Shortness of breath: Secondary | ICD-10-CM

## 2015-11-13 DIAGNOSIS — R0609 Other forms of dyspnea: Secondary | ICD-10-CM

## 2015-11-13 NOTE — Telephone Encounter (Signed)
Spoke with patient's wife and let her know the results of echo and that Dr Lovena Le wanted him to have a stress test to see if he can determine the cause of his SOB.  Let her know will hold off on CHF referral until after test.  She is aware someone will call from the office to schedule

## 2015-11-13 NOTE — Telephone Encounter (Signed)
-----   Message from Evans Lance, MD sent at 11/11/2015  9:57 PM EST ----- Schedule lexiscan myoview ----- Message -----    From: Rad Results In Interface    Sent: 11/10/2015   4:48 PM      To: Evans Lance, MD

## 2015-11-25 ENCOUNTER — Ambulatory Visit (HOSPITAL_COMMUNITY): Payer: Medicare Other | Admitting: Internal Medicine

## 2015-11-30 ENCOUNTER — Ambulatory Visit (HOSPITAL_COMMUNITY): Payer: Medicare Other | Attending: Internal Medicine

## 2015-11-30 DIAGNOSIS — I10 Essential (primary) hypertension: Secondary | ICD-10-CM | POA: Insufficient documentation

## 2015-11-30 DIAGNOSIS — R0609 Other forms of dyspnea: Secondary | ICD-10-CM | POA: Diagnosis not present

## 2015-11-30 DIAGNOSIS — R5383 Other fatigue: Secondary | ICD-10-CM | POA: Insufficient documentation

## 2015-11-30 DIAGNOSIS — R0602 Shortness of breath: Secondary | ICD-10-CM | POA: Diagnosis not present

## 2015-11-30 DIAGNOSIS — R9439 Abnormal result of other cardiovascular function study: Secondary | ICD-10-CM | POA: Diagnosis not present

## 2015-11-30 MED ORDER — REGADENOSON 0.4 MG/5ML IV SOLN
0.4000 mg | Freq: Once | INTRAVENOUS | Status: AC
  Administered 2015-11-30: 0.4 mg via INTRAVENOUS

## 2015-11-30 MED ORDER — TECHNETIUM TC 99M SESTAMIBI GENERIC - CARDIOLITE
32.7000 | Freq: Once | INTRAVENOUS | Status: AC | PRN
  Administered 2015-11-30: 32.7 via INTRAVENOUS

## 2015-11-30 MED ORDER — TECHNETIUM TC 99M SESTAMIBI GENERIC - CARDIOLITE
11.0000 | Freq: Once | INTRAVENOUS | Status: AC | PRN
  Administered 2015-11-30: 11 via INTRAVENOUS

## 2015-12-01 LAB — MYOCARDIAL PERFUSION IMAGING
CHL CUP NUCLEAR SDS: 1
CHL CUP RESTING HR STRESS: 61 {beats}/min
LHR: 0.21
Peak HR: 85 {beats}/min
SRS: 2
SSS: 3
TID: 1.12

## 2015-12-04 ENCOUNTER — Telehealth: Payer: Self-pay | Admitting: *Deleted

## 2015-12-04 DIAGNOSIS — R9439 Abnormal result of other cardiovascular function study: Secondary | ICD-10-CM

## 2015-12-04 DIAGNOSIS — Z01812 Encounter for preprocedural laboratory examination: Secondary | ICD-10-CM

## 2015-12-04 NOTE — Telephone Encounter (Signed)
Patient scheduled for R and L heart catheterization on 12/10/15 with Dr. Burt Knack.  Patient to come to office for pre-cath lab work 2/21 and to pick up instruction letter. Reviewed instructions and patient understands to call with further questions or concerns.

## 2015-12-04 NOTE — Telephone Encounter (Signed)
-----   Message from Evans Lance, MD sent at 12/01/2015 10:39 PM EST ----- Claiborne Billings, Mr. Menze needs a right and left heart cath. If he would like to come in and discuss this with me that would be fine. GT ----- Message -----    From: Sueanne Margarita, MD    Sent: 12/01/2015   4:34 PM      To: Evans Lance, MD

## 2015-12-08 ENCOUNTER — Other Ambulatory Visit (INDEPENDENT_AMBULATORY_CARE_PROVIDER_SITE_OTHER): Payer: Medicare Other

## 2015-12-08 DIAGNOSIS — Z01812 Encounter for preprocedural laboratory examination: Secondary | ICD-10-CM | POA: Diagnosis not present

## 2015-12-08 DIAGNOSIS — R9439 Abnormal result of other cardiovascular function study: Secondary | ICD-10-CM

## 2015-12-08 DIAGNOSIS — R931 Abnormal findings on diagnostic imaging of heart and coronary circulation: Secondary | ICD-10-CM

## 2015-12-09 LAB — BASIC METABOLIC PANEL
BUN: 16 mg/dL (ref 7–25)
CALCIUM: 9.3 mg/dL (ref 8.6–10.3)
CO2: 31 mmol/L (ref 20–31)
CREATININE: 1 mg/dL (ref 0.70–1.18)
Chloride: 102 mmol/L (ref 98–110)
Glucose, Bld: 84 mg/dL (ref 65–99)
Potassium: 4.2 mmol/L (ref 3.5–5.3)
Sodium: 141 mmol/L (ref 135–146)

## 2015-12-09 LAB — CBC WITH DIFFERENTIAL/PLATELET
BASOS ABS: 0 10*3/uL (ref 0.0–0.1)
BASOS PCT: 1 % (ref 0–1)
EOS ABS: 0.1 10*3/uL (ref 0.0–0.7)
Eosinophils Relative: 2 % (ref 0–5)
HCT: 41.5 % (ref 39.0–52.0)
Hemoglobin: 14 g/dL (ref 13.0–17.0)
Lymphocytes Relative: 29 % (ref 12–46)
Lymphs Abs: 1.2 10*3/uL (ref 0.7–4.0)
MCH: 29.5 pg (ref 26.0–34.0)
MCHC: 33.7 g/dL (ref 30.0–36.0)
MCV: 87.6 fL (ref 78.0–100.0)
MPV: 9.8 fL (ref 8.6–12.4)
Monocytes Absolute: 0.4 10*3/uL (ref 0.1–1.0)
Monocytes Relative: 10 % (ref 3–12)
NEUTROS PCT: 58 % (ref 43–77)
Neutro Abs: 2.5 10*3/uL (ref 1.7–7.7)
PLATELETS: 201 10*3/uL (ref 150–400)
RBC: 4.74 MIL/uL (ref 4.22–5.81)
RDW: 13.5 % (ref 11.5–15.5)
WBC: 4.3 10*3/uL (ref 4.0–10.5)

## 2015-12-09 LAB — PROTIME-INR
INR: 0.96 (ref <1.50)
PROTHROMBIN TIME: 12.9 s (ref 11.6–15.2)

## 2015-12-09 LAB — APTT: APTT: 30 s (ref 24–37)

## 2015-12-10 ENCOUNTER — Ambulatory Visit (HOSPITAL_COMMUNITY)
Admission: RE | Admit: 2015-12-10 | Discharge: 2015-12-10 | Disposition: A | Discharge status: Home / Self Care | Payer: Medicare Other | Source: Ambulatory Visit | Attending: Cardiovascular Disease | Admitting: Cardiovascular Disease

## 2015-12-10 ENCOUNTER — Telehealth: Payer: Self-pay | Admitting: Internal Medicine

## 2015-12-10 ENCOUNTER — Encounter (HOSPITAL_COMMUNITY)
Admission: RE | Disposition: A | Discharge status: Home / Self Care | Payer: Self-pay | Source: Ambulatory Visit | Attending: Cardiovascular Disease

## 2015-12-10 DIAGNOSIS — Z86711 Personal history of pulmonary embolism: Secondary | ICD-10-CM | POA: Insufficient documentation

## 2015-12-10 DIAGNOSIS — R931 Abnormal findings on diagnostic imaging of heart and coronary circulation: Secondary | ICD-10-CM

## 2015-12-10 DIAGNOSIS — I272 Other secondary pulmonary hypertension: Secondary | ICD-10-CM | POA: Insufficient documentation

## 2015-12-10 DIAGNOSIS — Z955 Presence of coronary angioplasty implant and graft: Secondary | ICD-10-CM | POA: Diagnosis not present

## 2015-12-10 DIAGNOSIS — R0600 Dyspnea, unspecified: Secondary | ICD-10-CM | POA: Diagnosis not present

## 2015-12-10 DIAGNOSIS — I739 Peripheral vascular disease, unspecified: Secondary | ICD-10-CM

## 2015-12-10 DIAGNOSIS — I255 Ischemic cardiomyopathy: Secondary | ICD-10-CM | POA: Insufficient documentation

## 2015-12-10 DIAGNOSIS — I2510 Atherosclerotic heart disease of native coronary artery without angina pectoris: Secondary | ICD-10-CM | POA: Diagnosis not present

## 2015-12-10 DIAGNOSIS — Z95810 Presence of automatic (implantable) cardiac defibrillator: Secondary | ICD-10-CM | POA: Insufficient documentation

## 2015-12-10 DIAGNOSIS — I779 Disorder of arteries and arterioles, unspecified: Secondary | ICD-10-CM | POA: Diagnosis present

## 2015-12-10 DIAGNOSIS — Z7982 Long term (current) use of aspirin: Secondary | ICD-10-CM | POA: Insufficient documentation

## 2015-12-10 DIAGNOSIS — R9439 Abnormal result of other cardiovascular function study: Secondary | ICD-10-CM | POA: Diagnosis present

## 2015-12-10 DIAGNOSIS — I351 Nonrheumatic aortic (valve) insufficiency: Secondary | ICD-10-CM | POA: Insufficient documentation

## 2015-12-10 DIAGNOSIS — E785 Hyperlipidemia, unspecified: Secondary | ICD-10-CM | POA: Diagnosis not present

## 2015-12-10 DIAGNOSIS — I5022 Chronic systolic (congestive) heart failure: Secondary | ICD-10-CM | POA: Diagnosis present

## 2015-12-10 DIAGNOSIS — I472 Ventricular tachycardia, unspecified: Secondary | ICD-10-CM

## 2015-12-10 DIAGNOSIS — Z87891 Personal history of nicotine dependence: Secondary | ICD-10-CM | POA: Insufficient documentation

## 2015-12-10 HISTORY — PX: CARDIAC CATHETERIZATION: SHX172

## 2015-12-10 LAB — POCT I-STAT 3, VENOUS BLOOD GAS (G3P V)
ACID-BASE EXCESS: 3 mmol/L — AB (ref 0.0–2.0)
ACID-BASE EXCESS: 5 mmol/L — AB (ref 0.0–2.0)
BICARBONATE: 31 meq/L — AB (ref 20.0–24.0)
Bicarbonate: 28.9 mEq/L — ABNORMAL HIGH (ref 20.0–24.0)
O2 SAT: 64 %
O2 SAT: 96 %
TCO2: 33 mmol/L (ref 0–100)
TCO2: 30 mmol/L (ref 0–100)
pCO2, Ven: 47.2 mmHg (ref 45.0–50.0)
pCO2, Ven: 52.7 mmHg — ABNORMAL HIGH (ref 45.0–50.0)
pH, Ven: 7.378 — ABNORMAL HIGH (ref 7.250–7.300)
pH, Ven: 7.394 — ABNORMAL HIGH (ref 7.250–7.300)
pO2, Ven: 35 mmHg (ref 30.0–45.0)
pO2, Ven: 81 mmHg — ABNORMAL HIGH (ref 30.0–45.0)

## 2015-12-10 SURGERY — RIGHT/LEFT HEART CATH AND CORONARY ANGIOGRAPHY

## 2015-12-10 MED ORDER — ASPIRIN 81 MG PO CHEW: 81.0000 mg | CHEWABLE_TABLET | ORAL | Status: DC

## 2015-12-10 MED ORDER — IOHEXOL 350 MG/ML SOLN
INTRAVENOUS | Status: DC | PRN
  Administered 2015-12-10: 85 mL via INTRAVENOUS

## 2015-12-10 MED ORDER — VERAPAMIL HCL 2.5 MG/ML IV SOLN
INTRAVENOUS | Status: AC
  Filled 2015-12-10: qty 2

## 2015-12-10 MED ORDER — SODIUM CHLORIDE 0.9% FLUSH: 3.0000 mL | Freq: Two times a day (BID) | INTRAVENOUS | Status: DC

## 2015-12-10 MED ORDER — MIDAZOLAM HCL 2 MG/2ML IJ SOLN
INTRAMUSCULAR | Status: AC
  Filled 2015-12-10: qty 2

## 2015-12-10 MED ORDER — HEPARIN (PORCINE) IN NACL 2-0.9 UNIT/ML-% IJ SOLN
INTRAMUSCULAR | Status: DC | PRN
  Administered 2015-12-10: 1500 mL

## 2015-12-10 MED ORDER — FENTANYL CITRATE (PF) 100 MCG/2ML IJ SOLN
INTRAMUSCULAR | Status: AC
  Filled 2015-12-10: qty 2

## 2015-12-10 MED ORDER — SODIUM CHLORIDE 0.9% FLUSH: 3.0000 mL | INTRAVENOUS | Status: DC | PRN

## 2015-12-10 MED ORDER — MIDAZOLAM HCL 2 MG/2ML IJ SOLN
INTRAMUSCULAR | Status: DC | PRN
  Administered 2015-12-10: 1 mg via INTRAVENOUS

## 2015-12-10 MED ORDER — SODIUM CHLORIDE 0.9 % IV SOLN: 250.0000 mL | INTRAVENOUS | Status: DC | PRN

## 2015-12-10 MED ORDER — HEPARIN SODIUM (PORCINE) 1000 UNIT/ML IJ SOLN
INTRAMUSCULAR | Status: AC
  Filled 2015-12-10: qty 1

## 2015-12-10 MED ORDER — HEPARIN SODIUM (PORCINE) 1000 UNIT/ML IJ SOLN
INTRAMUSCULAR | Status: DC | PRN
  Administered 2015-12-10: 4000 [IU] via INTRAVENOUS

## 2015-12-10 MED ORDER — LIDOCAINE HCL (PF) 1 % IJ SOLN
INTRAMUSCULAR | Status: DC | PRN
  Administered 2015-12-10: 5 mL

## 2015-12-10 MED ORDER — SODIUM CHLORIDE 0.9 % WEIGHT BASED INFUSION
1.0000 mL/kg/h | INTRAVENOUS | Status: DC
  Administered 2015-12-10: 1 mL/kg/h via INTRAVENOUS

## 2015-12-10 MED ORDER — LIDOCAINE HCL (PF) 1 % IJ SOLN
INTRAMUSCULAR | Status: AC
  Filled 2015-12-10: qty 30

## 2015-12-10 MED ORDER — SODIUM CHLORIDE 0.9 % WEIGHT BASED INFUSION: 3.0000 mL/kg/h | INTRAVENOUS | Status: DC

## 2015-12-10 MED ORDER — VERAPAMIL HCL 2.5 MG/ML IV SOLN
INTRAVENOUS | Status: DC | PRN
  Administered 2015-12-10: 10 mL via INTRA_ARTERIAL

## 2015-12-10 MED ORDER — SODIUM CHLORIDE 0.9% FLUSH
3.0000 mL | INTRAVENOUS | Status: DC | PRN
Start: 2015-12-10 — End: 2015-12-10

## 2015-12-10 MED ORDER — SODIUM CHLORIDE 0.9 % WEIGHT BASED INFUSION
3.0000 mL/kg/h | INTRAVENOUS | Status: AC
  Administered 2015-12-10: 3 mL/kg/h via INTRAVENOUS

## 2015-12-10 MED ORDER — FENTANYL CITRATE (PF) 100 MCG/2ML IJ SOLN
INTRAMUSCULAR | Status: DC | PRN
Start: 2015-12-10 — End: 2015-12-10
  Administered 2015-12-10: 25 ug via INTRAVENOUS

## 2015-12-10 SURGICAL SUPPLY — 14 items
CATH BALLN WEDGE 5F 110CM (CATHETERS) ×3 IMPLANT
CATH INFINITI 5 FR JL3.5 (CATHETERS) ×3 IMPLANT
CATH INFINITI 5FR ANG PIGTAIL (CATHETERS) ×6 IMPLANT
CATH INFINITI JR4 5F (CATHETERS) ×3 IMPLANT
DEVICE RAD COMP TR BAND LRG (VASCULAR PRODUCTS) ×3 IMPLANT
GLIDESHEATH SLEND SS 6F .021 (SHEATH) ×3 IMPLANT
KIT HEART LEFT (KITS) ×3 IMPLANT
PACK CARDIAC CATHETERIZATION (CUSTOM PROCEDURE TRAY) ×3 IMPLANT
SHEATH FAST CATH BRACH 5F 5CM (SHEATH) ×3 IMPLANT
SYR MEDRAD MARK V 150ML (SYRINGE) ×3 IMPLANT
TRANSDUCER W/STOPCOCK (MISCELLANEOUS) ×3 IMPLANT
TUBING CIL FLEX 10 FLL-RA (TUBING) ×3 IMPLANT
WIRE HI TORQ VERSACORE-J 145CM (WIRE) ×3 IMPLANT
WIRE SAFE-T 1.5MM-J .035X260CM (WIRE) ×3 IMPLANT

## 2015-12-10 NOTE — Interval H&P Note (Signed)
History and Physical Interval Note:  12/10/2015 1:44 PM  [name]  has presented today for surgery, with the diagnosis of abnormal miovue  The various methods of treatment have been discussed with the patient and family. After consideration of risks, benefits and other options for treatment, the patient has consented to  Procedure(s): Right/Left Heart Cath and Coronary Angiography (N/A) as a surgical intervention .  The patient's history has been reviewed, patient examined, no change in status, stable for surgery.  I have reviewed the patient's chart and labs.  Questions were answered to the patient's satisfaction.   Cath Lab Visit (complete for each Cath Lab visit)  Clinical Evaluation Leading to the Procedure:   ACS: No.  Non-ACS:    Anginal Classification: CCS III  Anti-ischemic medical therapy: Minimal Therapy (1 class of medications)  Non-Invasive Test Results: Intermediate-risk stress test findings: cardiac mortality 1-3%/year  Prior CABG: No previous CABG        Collier Salina [facility] 12/10/2015 1:44 PM

## 2015-12-10 NOTE — Progress Notes (Signed)
Site area: right brachial  Site Prior to Removal:  Level 0  Pressure Applied For 25 MINUTES    Minutes Beginning at 1455 Manual:   yes  Patient Status During Pull:  good  Post Pull Groin Site: n/a  Post Pull Instructions Given:  yes  Post Pull Pulses Present:  yes  Dressing Applied:  yes   Bedrest Begins: Brachial sheath  Comments: Pressure held for 25 minutes, dressing applied, pt tolerated procedure well

## 2015-12-10 NOTE — Telephone Encounter (Signed)
LM w/daughter to call back (do not have DPR to speak with her); also LM on Mr. Pangelinan home phone number to get permission to speak with her.

## 2015-12-10 NOTE — Discharge Instructions (Signed)
Radial Site Care °Refer to this sheet in the next few weeks. These instructions provide you with information about caring for yourself after your procedure. Your health care provider may also give you more specific instructions. Your treatment has been planned according to current medical practices, but problems sometimes occur. Call your health care provider if you have any problems or questions after your procedure. °WHAT TO EXPECT AFTER THE PROCEDURE °After your procedure, it is typical to have the following: °· Bruising at the radial site that usually fades within 1-2 weeks. °· Blood collecting in the tissue (hematoma) that may be painful to the touch. It should usually decrease in size and tenderness within 1-2 weeks. °HOME CARE INSTRUCTIONS °· Take medicines only as directed by your health care provider. °· You may shower 24-48 hours after the procedure or as directed by your health care provider. Remove the bandage (dressing) and gently wash the site with plain soap and water. Pat the area dry with a clean towel. Do not rub the site, because this may cause bleeding. °· Do not take baths, swim, or use a hot tub until your health care provider approves. °· Check your insertion site every day for redness, swelling, or drainage. °· Do not apply powder or lotion to the site. °· Do not flex or bend the affected arm for 24 hours or as directed by your health care provider. °· Do not push or pull heavy objects with the affected arm for 24 hours or as directed by your health care provider. °· Do not lift over 10 lb (4.5 kg) for 5 days after your procedure or as directed by your health care provider. °· Ask your health care provider when it is okay to: °¨ Return to work or school. °¨ Resume usual physical activities or sports. °¨ Resume sexual activity. °· Do not drive home if you are discharged the same day as the procedure. Have someone else drive you. °· You may drive 24 hours after the procedure unless otherwise  instructed by your health care provider. °· Do not operate machinery or power tools for 24 hours after the procedure. °· If your procedure was done as an outpatient procedure, which means that you went home the same day as your procedure, a responsible adult should be with you for the first 24 hours after you arrive home. °· Keep all follow-up visits as directed by your health care provider. This is important. °SEEK MEDICAL CARE IF: °· You have a fever. °· You have chills. °· You have increased bleeding from the radial site. Hold pressure on the site. °SEEK IMMEDIATE MEDICAL CARE IF: °· You have unusual pain at the radial site. °· You have redness, warmth, or swelling at the radial site. °· You have drainage (other than a small amount of blood on the dressing) from the radial site. °· The radial site is bleeding, and the bleeding does not stop after 30 minutes of holding steady pressure on the site. °· Your arm or hand becomes pale, cool, tingly, or numb. °  °This information is not intended to replace advice given to you by your health care provider. Make sure you discuss any questions you have with your health care provider. °  °Document Released: 11/05/2010 Document Revised: 10/24/2014 Document Reviewed: 04/21/2014 °Elsevier Interactive Patient Education ©2016 [employer] ° °

## 2015-12-10 NOTE — H&P (Signed)
Physician History and Physical    Patient ID: [name] MRN: [id number] DOB/AGE: 1941-01-11 75 y.o. Admit date: 12/10/2015  Primary Care Physician: Glenda Chroman., MD Primary Cardiologist  Cristopher Peru MD  HPI: 75 yo WM referred by Dr. Lovena Le for cardiac cath. He has a history of CAD with remote PCI of the RCA. He also has a history of ischemic cardiomyopathy, CHF, and VT. He is s/p BiV ICD. He presented recently with complaints of worsening dyspnea on exertion and some chest pain. Myoview study done and is intermediate risk. Cardiac cath recommended. Most recent Echo shows Normal LV function.  Review of systems complete and found to be negative unless listed above  Past Medical History  Diagnosis Date  . Ischemic cardiomyopathy      status post non-ST elevation microinfarction stent to the right coronary artery is a   . LV dysfunction      NYHA class I/prior ejection fraction 20-25% and an ejection fraction 7 2009 50-55%, ejection fraction 60% bedside echocardiogram July 2012   . Ventricular tachycardia ([place])     Inducible ventricular polymorphic tachycardia status post ICD followed by upgrade with CRT-D 2007, battery change at 01/31/2011  . Drug-induced gynecomastia      secondary to spironolactone  . Carotid artery disease (HCC)      less than 50% stenosis bilaterally  . Aortic insufficiency      moderate aortic ins moderate/asymptomatic/normal LV cavity size.   Marland Kitchen Dyslipidemia   . Pulmonary embolism (HCC)     Prior history of pulmonary embolism off Coumadin.  . ICD (implantable cardiac defibrillator), biventricular, in situ      Medtronic model  [id number]  . AICD (automatic cardioverter/defibrillator) present   . PONV (postoperative nausea and vomiting)     Family History  Problem Relation Age of Onset  . Diabetes type I Sister     daughter   . Clotting disorder Neg Hx     also no repiratory disease   . Prostate cancer Neg Hx     Social History   Social History  .  Marital Status: Married    Spouse Name: N/A  . Number of Children: 2  . Years of Education: N/A   Occupational History  . plumber - self employed    Social History Main Topics  . Smoking status: Former Smoker    Types: Cigarettes    Quit date: 06/16/2009  . Smokeless tobacco: Never Used  . Alcohol Use: No  . Drug Use: No  . Sexual Activity: Not on file   Other Topics Concern  . Not on file   Social History Narrative   Self employed Development worker, community; 2 daughters.     Past Surgical History  Procedure Laterality Date  . Thoracotomy      left  . Coronary stent placement    . Retropubic prostatectomy    . Inguinal hernia repair    . Cardiac defibrillator placement      medtronic, remote - yes  . Left breast biopsy    . Cardiac catheterization    . Thyroidectomy    . Cataract extraction w/phaco Left 08/25/2014    Procedure: CATARACT EXTRACTION PHACO AND INTRAOCULAR LENS PLACEMENT (IOC);  Surgeon: Tonny Branch, MD;  Location: [facility];  Service: Ophthalmology;  Laterality: Left;  CDE 5.79  . Cataract extraction w/phaco Right 09/18/2014    Procedure: CATARACT EXTRACTION PHACO AND INTRAOCULAR LENS PLACEMENT RIGHT EYE;  Surgeon: Tonny Branch, MD;  Location: [facility];  Service: Ophthalmology;  Laterality: Right;  CDE:3.35     Prescriptions prior to admission  Medication Sig Dispense Refill Last Dose  . aspirin 81 MG tablet Take 81 mg by mouth daily.    12/10/2015 at 0730  . calcium-vitamin D (OSCAL WITH D) 500-200 MG-UNIT tablet Take 1 tablet by mouth daily.   12/09/2015 at Unknown time  . carvedilol (COREG) 25 MG tablet Take 1 tablet (25 mg total) by mouth 2 (two) times daily with a meal. 180 tablet 3 12/10/2015 at 0730  . eplerenone (INSPRA) 25 MG tablet Take 1 tablet (25 mg total) by mouth daily. 90 tablet 3 12/09/2015 at Unknown time  . Fluticasone Furoate-Vilanterol (BREO ELLIPTA) 100-25 MCG/INH AEPB Inhale 1 puff into the lungs daily as needed.    12/08/2015  . furosemide (LASIX) 20 MG tablet Take  3 tablets (60 mg total) by mouth daily. (Patient taking differently: Take 20-40 mg by mouth 2 (two) times daily. Pt takes 2 tablets (40 mg) in the morning, and 1 tablet (20 mg) in the evening) 90 tablet 3 12/09/2015 at Unknown time  . Hydrocortisone-Aloe Vera (CORTIZONE-10 PLUS EX) Apply 1 application topically as needed.   12/10/2015 at Unknown time  . levothyroxine (SYNTHROID, LEVOTHROID) 112 MCG tablet Take 112 mcg by mouth daily before breakfast.   12/10/2015 at Unknown time  . Multiple Vitamins-Minerals (MULTIVITAMIN WITH MINERALS) tablet Take 1 tablet by mouth daily.   12/09/2015 at Unknown time  . niacin 500 MG tablet Take 500 mg by mouth daily with breakfast.     12/09/2015 at Unknown time  . omeprazole (PRILOSEC) 40 MG capsule Take 40 mg by mouth daily.   12/10/2015 at Unknown time  . simvastatin (ZOCOR) 20 MG tablet Take 1 tablet (20 mg total) by mouth at bedtime. 90 tablet 3 12/09/2015 at Unknown time  . trandolapril (MAVIK) 2 MG tablet Take 3 mg by mouth daily.   12/10/2015 at Unknown time    Physical Exam: Blood pressure 140/69, pulse 80, temperature 98.3 F (36.8 C), temperature source Oral, resp. rate 20, height 5\' 9"  (1.753 m), weight 65.772 kg (145 lb), SpO2 95 %. Current Weight  12/10/15 65.772 kg (145 lb)  11/30/15 64.864 kg (143 lb)  10/30/15 65.137 kg (143 lb 9.6 oz)    Chronically ill appearing 75 yo man, NAD HEENT: Unremarkable Neck: 6 cm JVD, no thyromegally Back: No CVA tenderness Lungs: Clear with no wheezes, well healed ICD incision. HEART: Regular rate rhythm, no murmurs, no rubs, no clicks Abd: soft, positive bowel sounds, no organomegally, no rebound, no guarding Ext: 2 plus pulses, no edema, no cyanosis, no clubbing Skin: No rashes no nodules Neuro: CN II through XII intact, motor grossly intact  Labs:   Lab Results  Component Value Date   WBC 4.3 12/08/2015   HGB 14.0 12/08/2015   HCT 41.5 12/08/2015   MCV 87.6 12/08/2015   PLT 201 12/08/2015      Recent Labs Lab 12/08/15 1234  NA 141  K 4.2  CL 102  CO2 31  BUN 16  CREATININE 1.00  CALCIUM 9.3  GLUCOSE 84   No results found for: CKMB, CKMBINDEX, TROPONINI No results found for: CHOL No results found for: HDL No results found for: LDLCALC No results found for: TRIG No results found for: CHOLHDL No results found for: LDLDIRECT  No results found for: PROBNP No results found for: TSH No results found for: HGBA1C  Radiology: No results found.  EKG: SV paced.  ASSESSMENT AND PLAN:  1. Symptoms of dyspnea on exertion and some chest pain. Possible anginal equivalent. Remote PCI of the RCA. Intermediate risk Myoview study. Plan to proceed with LHC today. The procedure and risks were reviewed including but not limited to death, myocardial infarction, stroke, arrythmias, bleeding, transfusion, emergency surgery, dye allergy, or renal dysfunction. The patient voices understanding and is agreeable to proceed. 2. History of ischemic CM. Most recent EF is normal. 3. S/p BiV ICD.  4. VT  Signed: Kashius Dominic Martinique, [place]  12/10/2015, 12:45 PM

## 2015-12-10 NOTE — Telephone Encounter (Signed)
New message   Pt daughter calling for the referral needed for the pulmnologist   Because he cant get in sooner than 3-28

## 2015-12-11 ENCOUNTER — Encounter (HOSPITAL_COMMUNITY): Payer: Self-pay | Admitting: Cardiology

## 2015-12-11 NOTE — Telephone Encounter (Signed)
Wife returning call from yesterday.  States he had a heart cath by Dr. Martinique yesterday and was told his SOB was more related to his lungs.  The appointment that was given to them was on 3/28 with pulmonary.  The daughter Vaughan Basta) had called yesterday and wanted to know if we could get a sooner appointment.  They were told that if our office called that could get him in sooner. Advised we didn't have DPR to speak with daughter but she states they signed papers on Wed that we could speak with Vaughan Basta and pt was present while I was speaking with wife and gave permission to speak with Vaughan Basta.  Advised will try to call to get earlier appointment.  Dx would be Dyspnea not cardiac related.

## 2015-12-11 NOTE — Telephone Encounter (Signed)
Returning your call from yesterday. °

## 2015-12-11 NOTE — Telephone Encounter (Signed)
F/u  Georganna Skeans requested to speak w/ RN concerning previous note. Please call back and discuss.

## 2015-12-14 ENCOUNTER — Other Ambulatory Visit: Payer: Medicare Other

## 2015-12-14 ENCOUNTER — Ambulatory Visit (INDEPENDENT_AMBULATORY_CARE_PROVIDER_SITE_OTHER): Payer: Medicare Other | Admitting: Pulmonary Disease

## 2015-12-14 ENCOUNTER — Ambulatory Visit (INDEPENDENT_AMBULATORY_CARE_PROVIDER_SITE_OTHER)
Admission: RE | Admit: 2015-12-14 | Discharge: 2015-12-14 | Disposition: A | Discharge status: Home / Self Care | Payer: Medicare Other | Source: Ambulatory Visit | Attending: Pulmonary Disease | Admitting: Pulmonary Disease

## 2015-12-14 ENCOUNTER — Other Ambulatory Visit (HOSPITAL_COMMUNITY): Payer: Self-pay | Admitting: Pulmonary Disease

## 2015-12-14 ENCOUNTER — Telehealth: Payer: Self-pay | Admitting: Pulmonary Disease

## 2015-12-14 ENCOUNTER — Encounter: Payer: Self-pay | Admitting: Pulmonary Disease

## 2015-12-14 VITALS — BP 124/66 | HR 60 | Temp 97.4°F | Ht 69.0 in | Wt 145.0 lb

## 2015-12-14 DIAGNOSIS — I272 Other secondary pulmonary hypertension: Secondary | ICD-10-CM | POA: Diagnosis not present

## 2015-12-14 DIAGNOSIS — I255 Ischemic cardiomyopathy: Secondary | ICD-10-CM

## 2015-12-14 DIAGNOSIS — I2510 Atherosclerotic heart disease of native coronary artery without angina pectoris: Secondary | ICD-10-CM

## 2015-12-14 DIAGNOSIS — J449 Chronic obstructive pulmonary disease, unspecified: Secondary | ICD-10-CM

## 2015-12-14 DIAGNOSIS — J432 Centrilobular emphysema: Secondary | ICD-10-CM

## 2015-12-14 DIAGNOSIS — R1310 Dysphagia, unspecified: Secondary | ICD-10-CM

## 2015-12-14 DIAGNOSIS — Z95810 Presence of automatic (implantable) cardiac defibrillator: Secondary | ICD-10-CM

## 2015-12-14 DIAGNOSIS — J387 Other diseases of larynx: Secondary | ICD-10-CM

## 2015-12-14 DIAGNOSIS — K219 Gastro-esophageal reflux disease without esophagitis: Secondary | ICD-10-CM

## 2015-12-14 DIAGNOSIS — R0602 Shortness of breath: Secondary | ICD-10-CM | POA: Diagnosis not present

## 2015-12-14 NOTE — Telephone Encounter (Signed)
Received a call from Opal Sidles at [facility]. Pt's CXR showed a new 1.2cm nodular density in the LLL. Recommend a chest CT.  SN - please advise. Thanks.

## 2015-12-14 NOTE — Patient Instructions (Addendum)
[name]-- it was great meeting you today...  Today we did a spirometry breathing test, an ambulatory oximetry test, a CXR & some blood work...    We will contact you w/ the results when available...   You have severe emphysema and we are prescribing BREO once daily, INCRUSE once daily, and an exercise program...  You are on the cusp of needing home oxygen & we will follow this...  Let's plana follow up visit in 6-8 weeks w/ a full pulmonary function analysis that day...  Ion the meantime we will sched a modified barium swallow test to check your swallowing mechanism & make sure you don't aspirate food or liquids...  Call for any questions.Marland KitchenMarland Kitchen

## 2015-12-14 NOTE — Progress Notes (Signed)
Subjective:    Patient ID: [name], male    DOB: [birth year], 75 y.o.   MRN: [id number]  HPI   OLDER DATA in Epic>>   CTChest 12/29/10 showed borderline cardiomeg, pacer device on left, no adenopathy, extensive centrilobular emphysema, atx or scarring at left base, NAD...   Last CXR 02/08/11 showed cardiomeg, AICD/pacer on left, hyperinflated lungs, clear, NAD...  ~  December 14, 2015:  Initial pulmonary evaluation by SN>  His PCP is DrVyas in [place], [place] and his Cardiologist is Medical sales representative...    75 y/o WM referred for pulmonary evaluation due to dyspnea>  [name] is an ex-smoker having started in his teens, smoked for 40 yrs up to 1ppd and says he quit in the early 2000's when he had an MI;  He denies hx lung problems as a young man- no hx asthma, bronchitis, pneumonia, etc; he has had a mild cough, not much sput, no hemoptysis; he developed SOB/DOE over the last 10 yrs and sl progressive by his history; we do not have records from his PCP but he has been on BREO but only using it prn w/ minimal benefit=> he requested a pulmonary evaluation to see if we could help w/ his dyspnea... He has mult somatic complaints- sinus problems, can't breathe thru is nose well, etc; he gets choked occas when eating & his GI is in [place], we do not have records...     He was Adm 12/10/15 for CATH> known CAD w/ remote PCI to the RCA, ischemic cardiomyopathy, CHF, hx of VT w/ BiV-ICD in place; Cath showed patent stent in distal RCA, & min CAD w/ 20% midRCA and 30%proxCIRC, norm LVF (EF=55-65% & no wall motion abnormalities), mild pulmHTN (PAsys=44) w/ norm filling pressures... Since his increased dyspnea was not related to cardiac issue he requested pulmonary evaluation...  Smoking Hx>  40 pack-yr hx and quit in early 2000s...  Pulmonary Hx>  He had a remote PE (details unknown) & was on Coumadin in the past; not prev told about COPD but was started on Breo prn last yr...  Medical Hx>  Hx CAD, AI,  cardiomyopathy w/ prev LV dysfunction, hx VTach w/ ICD placed, HL, hx thyroid cancer w/ surg~2000,   Family Hx>  3 brothers have all had lung cancer!  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  Current Meds> Breo (only using prn), ASA81, Coreg25Bid, Mavik3mg /d, Lasix20-3/d, Inspra25/d, Simva20, Niacin500, Synthroid112, Prilosec40  EXAM shows Afeb, VSS, O2sat=96% on RA at rest;  HEENT- neg, mallapmpati1;  Chest- decr BS bilat w/o w/r/r;  Heart- RR w/o m/r/g heard;  Abd- soft, nontender, neg;  Ext- neg w/o c/c/e;  Neuro- intact...   CXR 12/13/14 showed norm heart size, tortuous thor Ao, AICD device on left, 1.2cm nodular density in LLL (not seen on last film 2012), otherw clear lungs...   Spirometry 12/13/14>  FVC=1.74 (41%), FEV1=0.86 (26%), %1sec=49%, mid-flows reduced at 12% predicted;  This is c/w severe obstructive ventilatory defect & GOLD Stage4 COPD...  Ambulatory Oximetry 12/13/14>  O2sat=93% on RA at rest w/ pulse=61;  He ambulated 2 Laps & stopped w/ dyspnea & nadir O2sat=88% w/ pulse=96/min...  LAB here 12/13/25>  Alpha-1-Antitrypsin level = 171 (83-199), and Phenotype= MM  CT Chest done 12/22/15> atherosclerotic vasc dis, AICD noted, prior thyroidectomy, severe emphysema & atx in lingula but NO LLL nodule seen (?prev artifact?), patchy interstitial & airsp opacities in RML & RLL w/ airway thickening & subsolid  nodularity, no adenopathy, end-plate compression T3, 70% compression T7 => needs BMD  IMP >>     Severe COPD/ Emphysema w/ GOLD Stage 4 disease and severe dyspnea>      Ex-smoker, quit in 2000 w/ 40 pack-yr history    Mild pulmonary hypertension> PAsys=56mmHg on cath 11/2015    ? RUL pulm nodule on CXR, CT Chest w/o lesion seen    Hx pulmonary embolism> remote history, details are unknown to [place]...    CARDIAC issues>  CAD w/ prev PCI to RCA, ischemic cardiomyopathy, CHF, AI, Hx VT w/ BiV-ICD in place; followed by DrTaylor; pt states he had  Rheumatic fever as a child...    MEDICAL issues>  Pt w/ mult somatic complaints, HBP, HL, Hx thyroid ca & surg, Hx prostate cancer & surg, Compression fx T7 & T3; his PCP is DrVyas in [place]. PLAN >>     [name] has severe COPD/ emphysema w/ GOLD Stage 4 dis & FEV1=0.86 (26% predicted);  He is not on any regular breathing meds & we will start BREO once daily everyday, and INCRUSE once daily;  He needs exercise/ pulm rehab and we will pursue this in follow up;  He also has mild pulmHTN on his cath 11/2015 and he desaturates to 88% w/ ambulation=> therefore he needs HomeO2 portable system but he declines to start this now & we will have to ease him into the idea of using O2 and the benefits to his system;  He also c/o some dysphagia/ choking & I'm concerned about poss aspiration => we will arrange for a Barium Swallow & discussed te need to take the PPI 64min before dinner, NPO after dinner, Elev HOB 6", and f/u w/ his GI... we plan ROV in 6-8wks w/ FullPFTs to check LV & DLCO...    Past Medical History  Diagnosis Date  . Ischemic cardiomyopathy      status post non-ST elevation microinfarction stent to the right coronary artery is a   . LV dysfunction      NYHA class I/prior ejection fraction 20-25% and an ejection fraction 7 2009 50-55%, ejection fraction 60% bedside echocardiogram July 2012   . Ventricular tachycardia ([facility])     Inducible ventricular polymorphic tachycardia status post ICD followed by upgrade with CRT-D 2007, battery change at 01/31/2011  . Drug-induced gynecomastia      secondary to spironolactone  . Carotid artery disease (HCC)      less than 50% stenosis bilaterally  . Aortic insufficiency      moderate aortic ins moderate/asymptomatic/normal LV cavity size.   Marland Kitchen Dyslipidemia   . Pulmonary embolism (HCC)     Prior history of pulmonary embolism off Coumadin.  . ICD (implantable cardiac defibrillator), biventricular, in situ      Medtronic model  [id number]  . AICD (automatic  cardioverter/defibrillator) present   . PONV (postoperative nausea and vomiting)     Past Surgical History  Procedure Laterality Date  . Thoracotomy      left  . Coronary stent placement    . Retropubic prostatectomy    . Inguinal hernia repair    . Cardiac defibrillator placement      medtronic, remote - yes  . Left breast biopsy    . Cardiac catheterization    . Thyroidectomy    . Cataract extraction w/phaco Left 08/25/2014    Procedure: CATARACT EXTRACTION PHACO AND INTRAOCULAR LENS PLACEMENT (IOC);  Surgeon: Tonny Branch, MD;  Location: [facility];  Service: Ophthalmology;  Laterality: Left;  CDE 5.79  . Cataract extraction w/phaco Right 09/18/2014    Procedure: CATARACT EXTRACTION PHACO AND INTRAOCULAR LENS PLACEMENT RIGHT EYE;  Surgeon: Tonny Branch, MD;  Location: [facility];  Service: Ophthalmology;  Laterality: Right;  CDE:3.35  . Cardiac catheterization N/A 12/10/2015    Procedure: Right/Left Heart Cath and Coronary Angiography;  Surgeon: Peter M Martinique, MD;  Location: [facility];  Service: Cardiovascular;  Laterality: N/A;    Outpatient Encounter Prescriptions as of 12/14/2015  Medication Sig  . aspirin 81 MG tablet Take 81 mg by mouth daily.   . calcium-vitamin D (OSCAL WITH D) 500-200 MG-UNIT tablet Take 1 tablet by mouth daily.  . carvedilol (COREG) 25 MG tablet Take 1 tablet (25 mg total) by mouth 2 (two) times daily with a meal.  . eplerenone (INSPRA) 25 MG tablet Take 1 tablet (25 mg total) by mouth daily.  . Fluticasone Furoate-Vilanterol (BREO ELLIPTA) 100-25 MCG/INH AEPB Inhale 1 puff into the lungs daily as needed.   . furosemide (LASIX) 20 MG tablet Take 3 tablets (60 mg total) by mouth daily. (Patient taking differently: Take 20-40 mg by mouth 2 (two) times daily. Pt takes 2 tablets (40 mg) in the morning, and 1 tablet (20 mg) in the evening)  . Hydrocortisone-Aloe Vera (CORTIZONE-10 PLUS EX) Apply 1 application topically as needed.  Marland Kitchen levothyroxine (SYNTHROID,  LEVOTHROID) 112 MCG tablet Take 112 mcg by mouth daily before breakfast.  . Multiple Vitamins-Minerals (MULTIVITAMIN WITH MINERALS) tablet Take 1 tablet by mouth daily.  . niacin 500 MG tablet Take 500 mg by mouth daily with breakfast.    . omeprazole (PRILOSEC) 40 MG capsule Take 40 mg by mouth daily.  . simvastatin (ZOCOR) 20 MG tablet Take 1 tablet (20 mg total) by mouth at bedtime.  . trandolapril (MAVIK) 2 MG tablet Take 3 mg by mouth daily.   No facility-administered encounter medications on file as of 12/14/2015.    No Known Allergies   Immunization History  Administered Date(s) Administered  . Influenza, High Dose Seasonal PF 10/01/2015  . Pneumococcal Polysaccharide-23 10/17/2013    Family History  Problem Relation Age of Onset  . Diabetes type I Sister     daughter   . Clotting disorder Neg Hx     also no repiratory disease   . Prostate cancer Neg Hx   . Lung cancer Brother     several types of cancer  . Lung cancer Brother     several types of cancer  . Lung cancer Brother     Social History   Social History  . Marital Status: Married    Spouse Name: N/A  . Number of Children: 2  . Years of Education: N/A   Occupational History  . plumber - self employed    Social History Main Topics  . Smoking status: Former Smoker -- 1.00 packs/day for 43 years    Types: Cigarettes    Start date: 10/17/1957    Quit date: 12/13/2000  . Smokeless tobacco: Never Used     Comment: started at age 72  . Alcohol Use: No  . Drug Use: No  . Sexual Activity: Not on file   Other Topics Concern  . Not on file   Social History Narrative   Self employed Development worker, community; 2 daughters.     Current Medications, Allergies, Past Medical History, Past Surgical History, Family History, and Social History were reviewed in [employer] record.   Review of Systems  Constitutional: Negative  for fever and unexpected weight change.  HENT: Positive for congestion,  sneezing and trouble swallowing. Negative for dental problem, ear pain, nosebleeds, postnasal drip, rhinorrhea, sinus pressure and sore throat.   Eyes: Negative for redness and itching.  Respiratory: Positive for shortness of breath. Negative for cough, chest tightness and wheezing.   Cardiovascular: Negative for palpitations and leg swelling.  Gastrointestinal: Negative for nausea and vomiting.  Genitourinary: Negative for dysuria.  Musculoskeletal: Positive for joint swelling.  Skin: Positive for rash ( itching).  Neurological: Negative for headaches.  Hematological: Does not bruise/bleed easily.  Psychiatric/Behavioral: Negative for dysphoric mood. The patient is not nervous/anxious.       Objective:   Physical Exam  Vital Signs:  Reviewed...  General:  WD, WN, 75 y/o WM in NAD; alert & oriented; pleasant & cooperative... HEENT:  [place]/AT; Conjunctiva- pink, Sclera- nonicteric, EOM-wnl, PERRLA, EACs-clear, TMs-wnl; NOSE-clear; THROAT-clear & wnl. Neck:  Supple w/ fair ROM; no JVD; normal carotid impulses w/o bruits; no thyromegaly or nodules palpated; no lymphadenopathy. Chest:  CDecr BS bilat, clear to P & A; without wheezes, rales, or rhonchi heard. Heart:  Regular Rhythm; AICD on left; Gr 1/6 murmur, no rubs or gallops detected. Abdomen:  Soft & nontender- no guarding or rebound; normal bowel sounds; no organomegaly or masses palpated. Ext:  DecrROM; without deformities +arthritic changes; no varicose veins, venous insuffic, or edema;  Pulses intact w/o bruits. Neuro:  No focal neuro deficits; sensory testing normal; gait normal & balance OK; he is anxious. Derm:  No lesions noted; no rash etc. Lymph:  No cervical, supraclavicular, axillary, or inguinal adenopathy palpated.     Assessment & Plan:    IMP >>     Severe COPD/ Emphysema w/ GOLD Stage 4 disease and severe dyspnea>      Ex-smoker, quit in 2000 w/ 40 pack-yr history    Mild pulmonary hypertension> PAsys=63mmHg on cath  11/2015    ? RUL pulm nodule on CXR, CT Chest w/o lesion seen    Hx pulmonary embolism> remote history, details are unknown to [place]...    CARDIAC issues>  CAD w/ prev PCI to RCA, ischemic cardiomyopathy, CHF, AI, Hx VT w/ BiV-ICD in place; followed by DrTaylor; pt states he had Rheumatic fever as a child...    MEDICAL issues>  Pt w/ mult somatic complaints, HBP, HL, Hx thyroid ca & surg, Hx prostate cancer & surg, Compression fx T7 & T3; his PCP is DrVyas in [place].  PLAN >>     [name] has severe COPD/ emphysema w/ GOLD Stage 4 dis & FEV1=0.86 (26% predicted);  He is not on any regular breathing meds & we will start BREO once daily everyday, and INCRUSE once daily;  He needs exercise/ pulm rehab and we will pursue this in follow up;  He also has mild pulmHTN on his cath 11/2015 and he desaturates to 88% w/ ambulation=> therefore he needs HomeO2 portable system but he declines to start this now & we will have to ease him into the idea of using O2 and the benefits to his system;  He also c/o some dysphagia/ choking & I'm concerned about poss aspiration => we will arrange for a Barium Swallow & discussed te need to take the PPI 35min before dinner, NPO after dinner, Elev HOB 6", and f/u w/ his GI... we plan ROV in 6-8wks w/ FullPFTs to check LV & DLCO...   Patient's Medications  New Prescriptions  BREO >> one inhalation daily...                                               INCRUSE >> one inhalation daily...  Previous Medications   ASPIRIN 81 MG TABLET    Take 81 mg by mouth daily.    CALCIUM-VITAMIN D (OSCAL WITH D) 500-200 MG-UNIT TABLET    Take 1 tablet by mouth daily.   CARVEDILOL (COREG) 25 MG TABLET    Take 1 tablet (25 mg total) by mouth 2 (two) times daily with a meal.   EPLERENONE (INSPRA) 25 MG TABLET    Take 1 tablet (25 mg total) by mouth daily.   FLUTICASONE FUROATE-VILANTEROL (BREO ELLIPTA) 100-25 MCG/INH AEPB    Inhale 1 puff into the lungs daiy  every day   FUROSEMIDE (LASIX) 20 MG TABLET    Take 3 tablets (60 mg total) by mouth daily.   HYDROCORTISONE-ALOE VERA (CORTIZONE-10 PLUS EX)    Apply 1 application topically as needed.   LEVOTHYROXINE (SYNTHROID, LEVOTHROID) 112 MCG TABLET    Take 112 mcg by mouth daily before breakfast.   MULTIPLE VITAMINS-MINERALS (MULTIVITAMIN WITH MINERALS) TABLET    Take 1 tablet by mouth daily.   NIACIN 500 MG TABLET    Take 500 mg by mouth daily with breakfast.     OMEPRAZOLE (PRILOSEC) 40 MG CAPSULE    Take 40 mg by mouth daily.   SIMVASTATIN (ZOCOR) 20 MG TABLET    Take 1 tablet (20 mg total) by mouth at bedtime.   TRANDOLAPRIL (MAVIK) 2 MG TABLET    Take 3 mg by mouth daily.  Modified Medications   No medications on file  Discontinued Medications   No medications on file

## 2015-12-16 NOTE — Telephone Encounter (Signed)
This has been addressed per the result note.

## 2015-12-17 LAB — ALPHA-1 ANTITRYPSIN PHENOTYPE: A-1 Antitrypsin: 171 mg/dL (ref 83–199)

## 2015-12-18 ENCOUNTER — Telehealth: Payer: Self-pay | Admitting: Pulmonary Disease

## 2015-12-18 ENCOUNTER — Other Ambulatory Visit: Payer: Self-pay | Admitting: Pulmonary Disease

## 2015-12-18 DIAGNOSIS — R911 Solitary pulmonary nodule: Secondary | ICD-10-CM

## 2015-12-18 NOTE — Telephone Encounter (Signed)
Result Notes     Notes Recorded by Noralee Space, MD on 12/15/2015 at 3:16 PM I called pt/ wife w/ report> CXR shows new 1.2cm LLL nodule => needs CT Chest for further eval & we will set this up... SMN   Spoke with pt's wife. Advised her that we will get this taken care of for them. EE has placed the ordered. Nothing further was needed at this time.

## 2015-12-22 ENCOUNTER — Telehealth: Payer: Self-pay | Admitting: Pulmonary Disease

## 2015-12-22 ENCOUNTER — Ambulatory Visit (INDEPENDENT_AMBULATORY_CARE_PROVIDER_SITE_OTHER)
Admission: RE | Admit: 2015-12-22 | Discharge: 2015-12-22 | Disposition: A | Discharge status: Home / Self Care | Payer: Medicare Other | Source: Ambulatory Visit | Attending: Pulmonary Disease | Admitting: Pulmonary Disease

## 2015-12-22 DIAGNOSIS — R911 Solitary pulmonary nodule: Secondary | ICD-10-CM | POA: Diagnosis not present

## 2015-12-22 DIAGNOSIS — J449 Chronic obstructive pulmonary disease, unspecified: Secondary | ICD-10-CM | POA: Diagnosis not present

## 2015-12-22 NOTE — Telephone Encounter (Signed)
Last ov with SN on 12/14/15 Patient Instructions     [name]-- it was great meeting you today...  Today we did a spirometry breathing test, an ambulatory oximetry test, a CXR & some blood work...  We will contact you w/ the results when available...   You have severe emphysema and we are prescribing BREO once daily, INCRUSE once daily, and an exercise program...  You are on the cusp of needing home oxygen & we will follow this...  Let's plana follow up visit in 6-8 weeks w/ a full pulmonary function analysis that day...  Ion the meantime we will sched a modified barium swallow test to check your swallowing mechanism & make sure you don't aspirate food or liquids...  Call for any questions   Called and spoke with the pt's wife. She states since starting Incruse on 12/15/15 the patient c/o eye redness, blurriness, and sensitive to light. He stopped the Incruse 3 days ago and is currently taking just Breo and Memory Dance seems to be working well alone. She states that he has started walking on the treadmill daily and has seen improvement in his breathing. Pt's wife is requesting SN's recs. I informed her that a message will be sent to [facility] and once we received recs we will return her call. She asked that we call [phone]. She voiced understanding and had no further questions.  SN please advise  No Known Allergies   Current outpatient prescriptions:  .  aspirin 81 MG tablet, Take 81 mg by mouth daily. , Disp: , Rfl:  .  calcium-vitamin D (OSCAL WITH D) 500-200 MG-UNIT tablet, Take 1 tablet by mouth daily., Disp: , Rfl:  .  carvedilol (COREG) 25 MG tablet, Take 1 tablet (25 mg total) by mouth 2 (two) times daily with a meal., Disp: 180 tablet, Rfl: 3 .  eplerenone (INSPRA) 25 MG tablet, Take 1 tablet (25 mg total) by mouth daily., Disp: 90 tablet, Rfl: 3 .  Fluticasone Furoate-Vilanterol (BREO ELLIPTA) 100-25 MCG/INH AEPB, Inhale 1 puff into the lungs daily as needed. , Disp: , Rfl:  .   furosemide (LASIX) 20 MG tablet, Take 3 tablets (60 mg total) by mouth daily. (Patient taking differently: Take 20-40 mg by mouth 2 (two) times daily. Pt takes 2 tablets (40 mg) in the morning, and 1 tablet (20 mg) in the evening), Disp: 90 tablet, Rfl: 3 .  Hydrocortisone-Aloe Vera (CORTIZONE-10 PLUS EX), Apply 1 application topically as needed., Disp: , Rfl:  .  levothyroxine (SYNTHROID, LEVOTHROID) 112 MCG tablet, Take 112 mcg by mouth daily before breakfast., Disp: , Rfl:  .  Multiple Vitamins-Minerals (MULTIVITAMIN WITH MINERALS) tablet, Take 1 tablet by mouth daily., Disp: , Rfl:  .  niacin 500 MG tablet, Take 500 mg by mouth daily with breakfast.  , Disp: , Rfl:  .  omeprazole (PRILOSEC) 40 MG capsule, Take 40 mg by mouth daily., Disp: , Rfl:  .  simvastatin (ZOCOR) 20 MG tablet, Take 1 tablet (20 mg total) by mouth at bedtime., Disp: 90 tablet, Rfl: 3 .  trandolapril (MAVIK) 2 MG tablet, Take 3 mg by mouth daily., Disp: , Rfl:

## 2015-12-22 NOTE — Telephone Encounter (Signed)
Per SN: He really needs an anticholinergic with his stage 4 emphysema.  Recommend to continue breo, stop incruse, add spiriva handihaler inhale conents of 1 capsule daily.    Spoke with pt's wife, aware of recs.  Pt's wife states she wants to check with insurance to make sure spiriva is covered before pt starts medication.  Pt's wife will check with insurance and call us back.  Will await call.

## 2015-12-23 NOTE — Telephone Encounter (Signed)
After hours message recd 03/7 at 5:03 pm stating "New medication is very expensive,wondering if you have samples or something".  CB [phone]

## 2015-12-23 NOTE — Telephone Encounter (Signed)
lmtcb x1 for pt. 

## 2015-12-24 NOTE — Telephone Encounter (Signed)
lmtcb x2 

## 2015-12-24 NOTE — Telephone Encounter (Signed)
Pt wife returning call can be reached @ [phone].Hillery Hunter

## 2015-12-24 NOTE — Telephone Encounter (Signed)
LMTCB x 1 

## 2015-12-25 NOTE — Telephone Encounter (Signed)
Per SN:  Ok to give Spiriva  respimat samples, 2 puffs once daily. Explain the reason why the [facility] was initially prescribed is because it is typically cheaper under Medicare plans and we do not get [place] samples anymore. Regarding CT chest - shows severe emphysema but no nodule seen, artifact seen on CXR. Incidentally a compression fracture seen on T7, did pt experience any trauma? Will need to order bone density test.   Called and spoke to pt's wife. Informed her of the recs per SN. Sample left up front for pick up. Informed pt's wife of the CT chest results and recs. Pt's wife verbalized understanding and states the pt already had a bone density test recently. Pt's wife states she would call the [t's PCP to have them fax the results and see when it was done and will then call us back. Will await call.

## 2015-12-25 NOTE — Telephone Encounter (Signed)
Pt had bone density 08/27/15 per wife [phone]

## 2015-12-25 NOTE — Telephone Encounter (Signed)
Called spoke w/ spouse. She is going to call pt PCP on Monday to have them fax [place] the results as we do not have a release for this. Advised we will call her on Monday to f/u.

## 2015-12-25 NOTE — Telephone Encounter (Signed)
Called and spoke to pt's wife. Pt's wife states she Spiriva is covered by insurance but they have a high deductible and will need to meet that before being able to afford the Spiriva. Pt is requesting samples of Spiriva HH, advised pt's wife we do not have the [place] samples at this time. She states they are unable to take the Spiriva at this time and will just take [facility]. Pt's wife also requesting the results of CT chest.   Dr. Lenna Gilford please advise if you would like pt to have Spiriva respimat samples and results of CT chest. Thanks.

## 2015-12-28 NOTE — Telephone Encounter (Signed)
Spouse still has not call PCP yet but will do so today. Will hold to f/u later on

## 2015-12-29 NOTE — Telephone Encounter (Signed)
Spoke with pt's wife. States that she has a copy of the report and she will drop it by here to [place].

## 2015-12-30 DIAGNOSIS — J069 Acute upper respiratory infection, unspecified: Secondary | ICD-10-CM | POA: Diagnosis not present

## 2015-12-30 DIAGNOSIS — I10 Essential (primary) hypertension: Secondary | ICD-10-CM | POA: Diagnosis not present

## 2015-12-30 DIAGNOSIS — J449 Chronic obstructive pulmonary disease, unspecified: Secondary | ICD-10-CM | POA: Diagnosis not present

## 2016-01-05 ENCOUNTER — Ambulatory Visit: Payer: Medicare Other

## 2016-01-08 ENCOUNTER — Ambulatory Visit (HOSPITAL_COMMUNITY): Admission: RE | Admit: 2016-01-08 | Payer: Medicare Other | Source: Ambulatory Visit

## 2016-01-08 ENCOUNTER — Other Ambulatory Visit (HOSPITAL_COMMUNITY): Payer: Medicare Other

## 2016-01-11 ENCOUNTER — Telehealth: Payer: Self-pay | Admitting: Internal Medicine

## 2016-01-11 MED ORDER — FUROSEMIDE 20 MG PO TABS: 60.0000 mg | ORAL_TABLET | Freq: Every day | ORAL | Status: DC

## 2016-01-11 NOTE — Telephone Encounter (Signed)
New Message   *STAT* If patient is at the pharmacy, call can be transferred to refill team.   1. Which medications need to be refilled? (please list name of each medication and dose if known) Lasix 2. Which pharmacy/location (including street and city if local pharmacy) is medication to be sent to? [place]  3. Do they need a 30 day or 90 day supply? 90 day supply     Pt wife called states that she is not sure how to get the refills from the mail order

## 2016-01-12 ENCOUNTER — Institutional Professional Consult (permissible substitution): Payer: Medicare Other | Admitting: Pulmonary Disease

## 2016-01-27 ENCOUNTER — Encounter (HOSPITAL_COMMUNITY): Payer: Medicare Other

## 2016-01-27 ENCOUNTER — Ambulatory Visit: Payer: Medicare Other | Admitting: Pulmonary Disease

## 2016-02-01 ENCOUNTER — Ambulatory Visit (INDEPENDENT_AMBULATORY_CARE_PROVIDER_SITE_OTHER): Payer: Medicare Other | Admitting: *Deleted

## 2016-02-01 DIAGNOSIS — I255 Ischemic cardiomyopathy: Secondary | ICD-10-CM

## 2016-02-01 DIAGNOSIS — I472 Ventricular tachycardia, unspecified: Secondary | ICD-10-CM

## 2016-02-01 NOTE — Progress Notes (Signed)
Remote ICD transmission.   

## 2016-02-03 DIAGNOSIS — J069 Acute upper respiratory infection, unspecified: Secondary | ICD-10-CM | POA: Diagnosis not present

## 2016-02-03 DIAGNOSIS — I10 Essential (primary) hypertension: Secondary | ICD-10-CM | POA: Diagnosis not present

## 2016-02-03 DIAGNOSIS — Z87891 Personal history of nicotine dependence: Secondary | ICD-10-CM | POA: Diagnosis not present

## 2016-02-03 DIAGNOSIS — J449 Chronic obstructive pulmonary disease, unspecified: Secondary | ICD-10-CM | POA: Diagnosis not present

## 2016-02-04 ENCOUNTER — Ambulatory Visit (INDEPENDENT_AMBULATORY_CARE_PROVIDER_SITE_OTHER): Payer: Medicare Other | Admitting: Pulmonary Disease

## 2016-02-04 ENCOUNTER — Encounter: Payer: Self-pay | Admitting: Pulmonary Disease

## 2016-02-04 ENCOUNTER — Ambulatory Visit (HOSPITAL_COMMUNITY)
Admission: RE | Admit: 2016-02-04 | Discharge: 2016-02-04 | Disposition: A | Discharge status: Home / Self Care | Payer: Medicare Other | Source: Ambulatory Visit | Attending: Pulmonary Disease | Admitting: Pulmonary Disease

## 2016-02-04 VITALS — BP 108/62 | HR 61 | Temp 97.8°F | Ht 69.0 in | Wt 140.0 lb

## 2016-02-04 DIAGNOSIS — I272 Other secondary pulmonary hypertension: Secondary | ICD-10-CM | POA: Insufficient documentation

## 2016-02-04 DIAGNOSIS — J432 Centrilobular emphysema: Secondary | ICD-10-CM

## 2016-02-04 DIAGNOSIS — I5022 Chronic systolic (congestive) heart failure: Secondary | ICD-10-CM | POA: Diagnosis not present

## 2016-02-04 DIAGNOSIS — I255 Ischemic cardiomyopathy: Secondary | ICD-10-CM | POA: Diagnosis not present

## 2016-02-04 DIAGNOSIS — Z95810 Presence of automatic (implantable) cardiac defibrillator: Secondary | ICD-10-CM

## 2016-02-04 DIAGNOSIS — I2510 Atherosclerotic heart disease of native coronary artery without angina pectoris: Secondary | ICD-10-CM

## 2016-02-04 LAB — PULMONARY FUNCTION TEST
DL/VA % pred: 38 %
DL/VA: 1.72 ml/min/mmHg/L
DLCO UNC % PRED: 27 %
DLCO UNC: 8.47 ml/min/mmHg
FEF 25-75 PRE: 0.26 L/s
FEF 25-75 Post: 0.45 L/sec
FEF2575-%CHANGE-POST: 72 %
FEF2575-%Pred-Post: 20 %
FEF2575-%Pred-Pre: 11 %
FEV1-%CHANGE-POST: 19 %
FEV1-%PRED-POST: 42 %
FEV1-%PRED-PRE: 35 %
FEV1-POST: 1.26 L
FEV1-Pre: 1.05 L
FEV1FVC-%Change-Post: 1 %
FEV1FVC-%Pred-Pre: 48 %
FEV6-%CHANGE-POST: 19 %
FEV6-%PRED-POST: 68 %
FEV6-%PRED-PRE: 57 %
FEV6-PRE: 2.21 L
FEV6-Post: 2.64 L
FEV6FVC-%CHANGE-POST: 1 %
FEV6FVC-%PRED-PRE: 78 %
FEV6FVC-%Pred-Post: 79 %
FVC-%CHANGE-POST: 18 %
FVC-%Pred-Post: 86 %
FVC-%Pred-Pre: 72 %
FVC-Post: 3.54 L
FVC-Pre: 2.99 L
POST FEV6/FVC RATIO: 75 %
PRE FEV6/FVC RATIO: 74 %
Post FEV1/FVC ratio: 36 %
Pre FEV1/FVC ratio: 35 %
RV % PRED: 181 %
RV: 4.48 L
TLC % pred: 109 %
TLC: 7.52 L

## 2016-02-04 MED ORDER — TIOTROPIUM BROMIDE MONOHYDRATE 2.5 MCG/ACT IN AERS: 2.0000 | INHALATION_SPRAY | Freq: Every day | RESPIRATORY_TRACT | Status: DC

## 2016-02-04 MED ORDER — ALBUTEROL SULFATE (2.5 MG/3ML) 0.083% IN NEBU
2.5000 mg | INHALATION_SOLUTION | Freq: Once | RESPIRATORY_TRACT | Status: AC
  Administered 2016-02-04: 2.5 mg via RESPIRATORY_TRACT

## 2016-02-04 MED ORDER — ALBUTEROL SULFATE HFA 108 (90 BASE) MCG/ACT IN AERS: 2.0000 | INHALATION_SPRAY | Freq: Four times a day (QID) | RESPIRATORY_TRACT | Status: DC | PRN

## 2016-02-04 MED ORDER — FLUTICASONE FUROATE-VILANTEROL 100-25 MCG/INH IN AEPB: 1.0000 | INHALATION_SPRAY | Freq: Every day | RESPIRATORY_TRACT | Status: DC

## 2016-02-04 NOTE — Patient Instructions (Signed)
Today we updated your med list in our EPIC system...    Continue your current medications the same...  We wrote a new prescription for the [facility]- one inhalation every day...  We wrote for the [facility] RESPIMAT - 2 inhalations once daily on a regular basis...  We also wrote for a rescue inhaler- PROAIR-HFA inhale 1-2 puffs every 6H as needed for wheezing, chest tightness, etc...  Call for any questions...  Let's plan a follow up visit in 3-80mo, sooner if needed for breathing problems.Marland KitchenMarland Kitchen

## 2016-02-05 DIAGNOSIS — J449 Chronic obstructive pulmonary disease, unspecified: Secondary | ICD-10-CM | POA: Diagnosis not present

## 2016-02-05 DIAGNOSIS — I2510 Atherosclerotic heart disease of native coronary artery without angina pectoris: Secondary | ICD-10-CM | POA: Diagnosis not present

## 2016-02-05 DIAGNOSIS — E7800 Pure hypercholesterolemia, unspecified: Secondary | ICD-10-CM | POA: Diagnosis not present

## 2016-02-05 DIAGNOSIS — M159 Polyosteoarthritis, unspecified: Secondary | ICD-10-CM | POA: Diagnosis not present

## 2016-02-20 NOTE — Progress Notes (Signed)
Subjective:    Patient ID: [name], male    DOB: [birth year], 75 y.o.   MRN: [id number]  HPI   OLDER DATA in Epic>>   CTChest 12/29/10 showed borderline cardiomeg, pacer device on left, no adenopathy, extensive centrilobular emphysema, atx or scarring at left base, NAD...   Last CXR 02/08/11 showed cardiomeg, AICD/pacer on left, hyperinflated lungs, clear, NAD...  ~  December 14, 2015:  Initial pulmonary evaluation by SN>  His PCP is DrVyas in [place], [place] and his Cardiologist is Medical sales representative...    75 y/o WM referred for pulmonary evaluation due to dyspnea>  [name] is an ex-smoker having started in his teens, smoked for 40 yrs up to 1ppd and says he quit in the early 2000's when he had an MI;  He denies hx lung problems as a young man- no hx asthma, bronchitis, pneumonia, etc; he has had a mild cough, not much sput, no hemoptysis; he developed SOB/DOE over the last 10 yrs and sl progressive by his history; we do not have records from his PCP but he has been on BREO but only using it prn w/ minimal benefit=> he requested a pulmonary evaluation to see if we could help w/ his dyspnea... He has mult somatic complaints- sinus problems, can't breathe thru is nose well, etc; he gets choked occas when eating & his GI is in [address], we do not have records...     He was Adm 12/10/15 for CATH> known CAD w/ remote PCI to the RCA, ischemic cardiomyopathy, CHF, hx of VT w/ BiV-ICD in place; Cath showed patent stent in distal RCA, & min CAD w/ 20% midRCA and 30%proxCIRC, norm LVF (EF=55-65% & no wall motion abnormalities), mild pulmHTN (PAsys=44) w/ norm filling pressures... Since his increased dyspnea was not related to cardiac issue he requested pulmonary evaluation...  Smoking Hx>  40 pack-yr hx and quit in early 2000s...  Pulmonary Hx>  He had a remote PE (details unknown) & was on Coumadin in the past; not prev told about COPD but was started on Breo prn last yr...  Medical Hx>  Hx CAD, AI,  cardiomyopathy w/ prev LV dysfunction, hx VTach w/ ICD placed, HL, hx thyroid cancer w/ surg~2000,   Family Hx>  3 brothers have all had lung cancer!  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  Current Meds> Breo (only using prn), ASA81, Coreg25Bid, Mavik3mg /d, Lasix20-3/d, Inspra25/d, Simva20, Niacin500, Synthroid112, Prilosec40 EXAM shows Afeb, VSS, O2sat=96% on RA at rest;  HEENT- neg, mallapmpati1;  Chest- decr BS bilat w/o w/r/r;  Heart- RR w/o m/r/g heard;  Abd- soft, nontender, neg;  Ext- neg w/o c/c/e;  Neuro- intact...   CXR 12/13/14 showed norm heart size, tortuous thor Ao, AICD device on left, 1.2cm nodular density in LLL (not seen on last film 2012), otherw clear lungs...   Spirometry 12/13/14>  FVC=1.74 (41%), FEV1=0.86 (26%), %1sec=49%, mid-flows reduced at 12% predicted;  This is c/w severe obstructive ventilatory defect & GOLD Stage4 COPD...  Ambulatory Oximetry 12/13/14>  O2sat=93% on RA at rest w/ pulse=61;  He ambulated 2 Laps & stopped w/ dyspnea & nadir O2sat=88% w/ pulse=96/min...  LAB here 12/13/25>  Alpha-1-Antitrypsin level = 171 (83-199), and Phenotype= MM  CT Chest done 12/22/15> atherosclerotic vasc dis, AICD noted, prior thyroidectomy, severe emphysema & atx in lingula but NO LLL nodule seen (?prev artifact?), patchy interstitial & airsp opacities in RML & RLL w/ airway thickening & subsolid nodularity,  no adenopathy, end-plate compression T3, 70% compression T7 => needs BMD IMP >>     Severe COPD/ Emphysema w/ GOLD Stage 4 disease and severe dyspnea>      Ex-smoker, quit in 2000 w/ 40 pack-yr history    Mild pulmonary hypertension> PAsys=106mmHg on cath 11/2015    ? RUL pulm nodule on CXR, CT Chest w/o lesion seen    Hx pulmonary embolism> remote history, details are unknown to [place]...    CARDIAC issues>  CAD w/ prev PCI to RCA, ischemic cardiomyopathy, CHF, AI, Hx VT w/ BiV-ICD in place; followed by DrTaylor; pt states he had Rheumatic  fever as a child...    MEDICAL issues>  Pt w/ mult somatic complaints, HBP, HL, Hx thyroid ca & surg, Hx prostate cancer & surg, Compression fx T7 & T3; his PCP is DrVyas in [place]. PLAN >>     [name] has severe COPD/ emphysema w/ GOLD Stage 4 dis & FEV1=0.86 (26% predicted);  He is not on any regular breathing meds & we will start BREO once daily everyday, and INCRUSE once daily;  He needs exercise/ pulm rehab and we will pursue this in follow up;  He also has mild pulmHTN on his cath 11/2015 and he desaturates to 88% w/ ambulation=> therefore he needs HomeO2 portable system but he declines to start this now & we will have to ease him into the idea of using O2 and the benefits to his system;  He also c/o some dysphagia/ choking & I'm concerned about poss aspiration => we will arrange for a Barium Swallow & discussed te need to take the PPI 59min before dinner, NPO after dinner, Elev HOB 6", and f/u w/ his GI... we plan ROV in 6-8wks w/ FullPFTs to check LV & DLCO...   ~  February 04, 2016:  30mo ROV w/ SN>  74 y/o man w/ GOLD Stage 4 COPD (FEV1=0.86, 26% pred), ex-smoker quit in 2000, w/ SOB/DOE and mult somatic complaints; he has mild pulmHTN on Cath 11/2015 w/ PAsys=44 and he desaturated to 88% w/ ambulation (he declined to start HomeO2); CT Chest showed severe emphysema w/ some atx & scarring; he has known cardiac dis w/ prev PCI to the RCA, ischemic cardiomyopathy, CHF, AI, Hx VT w/ BiV-ICD in place; we were concerned about poss aspiration w/ c/o dysphagia/ choking & requested a Ba swallow but he cancelled the test; he is on BREO & INCRUSE, along w/ Omep40 & an antireflux regimen; we discussed the need for an exercise program & strongly encouraged Pulm Rehab program...  He states he could not tolerate the Incruse, so we had called in [place] but he never picked this med up due to cost;  We had a long discussion about the avail meds and limited options for rx!    Severe COPD/ Emphysema w/ GOLD Stage 4  disease and severe dyspnea>  On Breo alone, states "intol" to Incruse, never picked up the Spiriva; we discussed need for an anticholinergic & he will try the Spiriva Respimat- 2sp/d...    Ex-smoker, quit in 2000 w/ 40 pack-yr history    Mild pulmonary hypertension> PAsys=31mmHg on cath 11/2015, hehas declined to start Home O2 at this time & we will follow...    ? RUL pulm nodule on CXR, CT Chest w/o lesion seen    Hx pulmonary embolism> remote history, details are unknown to [place]...    CARDIAC issues>  CAD w/ prev PCI to RCA, ischemic cardiomyopathy, CHF, AI, Hx  VT w/ BiV-ICD in place; followed by DrTaylor; pt states he had Rheumatic fever as a child...    MEDICAL issues>  Pt w/ mult somatic complaints, HBP, HL, Hx thyroid ca & surg, Hx prostate cancer & surg, Compression fx T7 & T3; his PCP is DrVyas in [place]. EXAM shows Afeb, VSS, O2sat=93% on RA at rest;  HEENT- neg, mallapmpati1;  Chest- decr BS bilat w/o w/r/r;  Heart- RR w/o m/r/g heard;  Abd- soft, nontender, neg;  Ext- neg w/o c/c/e;  Neuro- intact, anxious...   FullPFT 02/04/16>  FVC=2.99 (72%), FEV1=1.05 (35%), %1sec=35, mid-flows reduced at 11% predicted; post bronchodil there was a 19% incr in FEV1; TLC=7.52 (109%), RV=4.48 (181), RV/TLC=60%;  DLCO=27% predicted... This is c/w a severe obstructive ventilatory defect w/ air trapping, and a marked decr in DLCO c/w emphysema but there was also a signif reversible component post bronchodil... IMP/PLAN>>  I explained how he needs the ICS/LABA and Anticholinergic w/ his emphysema, + ProventilHFA rescue inhaler prn;this time); he aslo needs home O2 but declines at present and PulmRehab (he will consider this)...    Past Medical History  Diagnosis Date  . Ischemic cardiomyopathy      status post non-ST elevation microinfarction stent to the right coronary artery is a   . LV dysfunction      NYHA class I/prior ejection fraction 20-25% and an ejection fraction 7 2009 50-55%, ejection fraction 60%  bedside echocardiogram July 2012   . Ventricular tachycardia ([facility])     Inducible ventricular polymorphic tachycardia status post ICD followed by upgrade with CRT-D 2007, battery change at 01/31/2011  . Drug-induced gynecomastia      secondary to spironolactone  . Carotid artery disease (HCC)      less than 50% stenosis bilaterally  . Aortic insufficiency      moderate aortic ins moderate/asymptomatic/normal LV cavity size.   Marland Kitchen Dyslipidemia   . Pulmonary embolism (HCC)     Prior history of pulmonary embolism off Coumadin.  . ICD (implantable cardiac defibrillator), biventricular, in situ      Medtronic model  [id number]  . AICD (automatic cardioverter/defibrillator) present   . PONV (postoperative nausea and vomiting)     Past Surgical History  Procedure Laterality Date  . Thoracotomy      left  . Coronary stent placement    . Retropubic prostatectomy    . Inguinal hernia repair    . Cardiac defibrillator placement      medtronic, remote - yes  . Left breast biopsy    . Cardiac catheterization    . Thyroidectomy    . Cataract extraction w/phaco Left 08/25/2014    Procedure: CATARACT EXTRACTION PHACO AND INTRAOCULAR LENS PLACEMENT (IOC);  Surgeon: Tonny Branch, MD;  Location: [facility];  Service: Ophthalmology;  Laterality: Left;  CDE 5.79  . Cataract extraction w/phaco Right 09/18/2014    Procedure: CATARACT EXTRACTION PHACO AND INTRAOCULAR LENS PLACEMENT RIGHT EYE;  Surgeon: Tonny Branch, MD;  Location: [facility];  Service: Ophthalmology;  Laterality: Right;  CDE:3.35  . Cardiac catheterization N/A 12/10/2015    Procedure: Right/Left Heart Cath and Coronary Angiography;  Surgeon: Peter M Martinique, MD;  Location: [facility];  Service: Cardiovascular;  Laterality: N/A;    Outpatient Encounter Prescriptions as of 02/04/2016  Medication Sig  . aspirin 81 MG tablet Take 81 mg by mouth daily.   . calcium-vitamin D (OSCAL WITH D) 500-200 MG-UNIT tablet Take 1 tablet by mouth daily.  .  carvedilol (COREG) 25  MG tablet Take 1 tablet (25 mg total) by mouth 2 (two) times daily with a meal.  . eplerenone (INSPRA) 25 MG tablet Take 1 tablet (25 mg total) by mouth daily.  . fluticasone furoate-vilanterol (BREO ELLIPTA) 100-25 MCG/INH AEPB Inhale 1 puff into the lungs daily.  . furosemide (LASIX) 20 MG tablet Take 3 tablets (60 mg total) by mouth daily.  . Hydrocortisone-Aloe Vera (CORTIZONE-10 PLUS EX) Apply 1 application topically as needed.  Marland Kitchen levothyroxine (SYNTHROID, LEVOTHROID) 112 MCG tablet Take 112 mcg by mouth daily before breakfast.  . Multiple Vitamins-Minerals (MULTIVITAMIN WITH MINERALS) tablet Take 1 tablet by mouth daily.  . niacin 500 MG tablet Take 500 mg by mouth daily with breakfast.    . omeprazole (PRILOSEC) 40 MG capsule Take 40 mg by mouth daily.  . simvastatin (ZOCOR) 20 MG tablet Take 1 tablet (20 mg total) by mouth at bedtime.  . trandolapril (MAVIK) 2 MG tablet Take 3 mg by mouth daily.  Marland Kitchen albuterol (PROVENTIL HFA;VENTOLIN HFA) 108 (90 Base) MCG/ACT inhaler Inhale 2 puffs into the lungs every 6 (six) hours as needed for wheezing or shortness of breath.    No Known Allergies   Immunization History  Administered Date(s) Administered  . Influenza, High Dose Seasonal PF 10/01/2015  . Pneumococcal Polysaccharide-23 10/17/2013    Family History  Problem Relation Age of Onset  . Diabetes type I Sister     daughter   . Clotting disorder Neg Hx     also no repiratory disease   . Prostate cancer Neg Hx   . Lung cancer Brother     several types of cancer  . Lung cancer Brother     several types of cancer  . Lung cancer Brother     Social History   Social History  . Marital Status: Married    Spouse Name: N/A  . Number of Children: 2  . Years of Education: N/A   Occupational History  . plumber - self employed    Social History Main Topics  . Smoking status: Former Smoker -- 1.00 packs/day for 43 years    Types: Cigarettes    Start date:  10/17/1957    Quit date: 12/13/2000  . Smokeless tobacco: Never Used     Comment: started at age 52  . Alcohol Use: No  . Drug Use: No  . Sexual Activity: Not on file   Other Topics Concern  . Not on file   Social History Narrative   Self employed Development worker, community; 2 daughters.     Current Medications, Allergies, Past Medical History, Past Surgical History, Family History, and Social History were reviewed in [employer] record.   Review of Systems  Constitutional: Negative for fever and unexpected weight change.  HENT: Positive for congestion, sneezing and trouble swallowing. Negative for dental problem, ear pain, nosebleeds, postnasal drip, rhinorrhea, sinus pressure and sore throat.   Eyes: Negative for redness and itching.  Respiratory: Positive for shortness of breath. Negative for cough, chest tightness and wheezing.   Cardiovascular: Negative for palpitations and leg swelling.  Gastrointestinal: Negative for nausea and vomiting.  Genitourinary: Negative for dysuria.  Musculoskeletal: Positive for joint swelling.  Skin: Positive for rash ( itching).  Neurological: Negative for headaches.  Hematological: Does not bruise/bleed easily.  Psychiatric/Behavioral: Negative for dysphoric mood. The patient is not nervous/anxious.       Objective:   Physical Exam   Vital Signs:  Reviewed...  General:  WD, WN, 76 y/o WM  in NAD; alert & oriented; pleasant & cooperative... HEENT:  [place]/AT; Conjunctiva- pink, Sclera- nonicteric, EOM-wnl, PERRLA, EACs-clear, TMs-wnl; NOSE-clear; THROAT-clear & wnl. Neck:  Supple w/ fair ROM; no JVD; normal carotid impulses w/o bruits; no thyromegaly or nodules palpated; no lymphadenopathy. Chest:  Decr BS bilat, clear to P & A; without wheezes, rales, or rhonchi heard. Heart:  Regular Rhythm; AICD on left; Gr 1/6 murmur, no rubs or gallops detected. Abdomen:  Soft & nontender- no guarding or rebound; normal bowel sounds; no organomegaly  or masses palpated. Ext:  DecrROM; without deformities +arthritic changes; no varicose veins, venous insuffic, or edema;  Pulses intact w/o bruits. Neuro:  No focal neuro deficits; sensory testing normal; gait normal & balance OK; he is anxious. Derm:  No lesions noted; no rash etc. Lymph:  No cervical, supraclavicular, axillary, or inguinal adenopathy palpated.     Assessment & Plan:    IMP >>     Severe COPD/ Emphysema w/ GOLD Stage 4 disease and severe dyspnea>      Ex-smoker, quit in 2000 w/ 40 pack-yr history    Mild pulmonary hypertension> PAsys=78mmHg on cath 11/2015    ? RUL pulm nodule on CXR, CT Chest w/o lesion seen    Hx pulmonary embolism> remote history, details are unknown to [place]...    CARDIAC issues>  CAD w/ prev PCI to RCA, ischemic cardiomyopathy, CHF, AI, Hx VT w/ BiV-ICD in place; followed by DrTaylor; pt states he had Rheumatic fever as a child...    MEDICAL issues>  Pt w/ mult somatic complaints, HBP, HL, Hx thyroid ca & surg, Hx prostate cancer & surg, Compression fx T7 & T3; his PCP is DrVyas in [place].  PLAN >>  12/14/15>   [name] has severe COPD/ emphysema w/ GOLD Stage 4 dis & FEV1=0.86 (26% predicted);  He is not on any regular breathing meds & we will start BREO once daily everyday, and INCRUSE once daily;  He needs exercise/ pulm rehab and we will pursue this in follow up;  He also has mild pulmHTN on his cath 11/2015 and he desaturates to 88% w/ ambulation=> therefore he needs HomeO2 portable system but he declines to start this now & we will have to ease him into the idea of using O2 and the benefits to his system;  He also c/o some dysphagia/ choking & I'm concerned about poss aspiration => we will arrange for a Barium Swallow & discussed te need to take the PPI 83min before dinner, NPO after dinner, Elev HOB 6", and f/u w/ his GI... 4/20>   I explained how he needs the ICS/LABA and Anticholinergic w/ his emphysema, + ProventilHFA rescue inhaler prn;this time); he  aslo needs home O2 but declines at present and PulmRehab (he will consider this)...   Patient's Medications  New Prescriptions   ALBUTEROL (PROVENTIL HFA;VENTOLIN HFA) 108 (90 BASE) MCG/ACT INHALER    Inhale 2 puffs into the lungs every 6 (six) hours as needed for wheezing or shortness of breath.   TIOTROPIUM BROMIDE MONOHYDRATE (SPIRIVA RESPIMAT) 2.5 MCG/ACT AERS    Inhale 2 puffs into the lungs daily.  Previous Medications   ASPIRIN 81 MG TABLET    Take 81 mg by mouth daily.    CALCIUM-VITAMIN D (OSCAL WITH D) 500-200 MG-UNIT TABLET    Take 1 tablet by mouth daily.   CARVEDILOL (COREG) 25 MG TABLET    Take 1 tablet (25 mg total) by mouth 2 (two) times daily with a meal.  EPLERENONE (INSPRA) 25 MG TABLET    Take 1 tablet (25 mg total) by mouth daily.   FUROSEMIDE (LASIX) 20 MG TABLET    Take 3 tablets (60 mg total) by mouth daily.   HYDROCORTISONE-ALOE VERA (CORTIZONE-10 PLUS EX)    Apply 1 application topically as needed.   LEVOTHYROXINE (SYNTHROID, LEVOTHROID) 112 MCG TABLET    Take 112 mcg by mouth daily before breakfast.   MULTIPLE VITAMINS-MINERALS (MULTIVITAMIN WITH MINERALS) TABLET    Take 1 tablet by mouth daily.   NIACIN 500 MG TABLET    Take 500 mg by mouth daily with breakfast.     OMEPRAZOLE (PRILOSEC) 40 MG CAPSULE    Take 40 mg by mouth daily.   SIMVASTATIN (ZOCOR) 20 MG TABLET    Take 1 tablet (20 mg total) by mouth at bedtime.   TRANDOLAPRIL (MAVIK) 2 MG TABLET    Take 3 mg by mouth daily.  Modified Medications   Modified Medication Previous Medication   FLUTICASONE FUROATE-VILANTEROL (BREO ELLIPTA) 100-25 MCG/INH AEPB Fluticasone Furoate-Vilanterol (BREO ELLIPTA) 100-25 MCG/INH AEPB      Inhale 1 puff into the lungs daily.    Inhale 1 puff into the lungs daily as needed.   Discontinued Medications   No medications on file

## 2016-02-25 DIAGNOSIS — M159 Polyosteoarthritis, unspecified: Secondary | ICD-10-CM | POA: Diagnosis not present

## 2016-02-25 DIAGNOSIS — E7800 Pure hypercholesterolemia, unspecified: Secondary | ICD-10-CM | POA: Diagnosis not present

## 2016-02-25 DIAGNOSIS — I2510 Atherosclerotic heart disease of native coronary artery without angina pectoris: Secondary | ICD-10-CM | POA: Diagnosis not present

## 2016-02-25 DIAGNOSIS — J449 Chronic obstructive pulmonary disease, unspecified: Secondary | ICD-10-CM | POA: Diagnosis not present

## 2016-03-09 ENCOUNTER — Encounter: Payer: Self-pay | Admitting: Cardiology

## 2016-03-09 LAB — CUP PACEART REMOTE DEVICE CHECK
Brady Statistic AP VS Percent: 0.03 %
Brady Statistic AS VP Percent: 36.71 %
Brady Statistic RA Percent Paced: 63.1 %
Brady Statistic RV Percent Paced: 99.78 %
Date Time Interrogation Session: 20170417073528
HIGH POWER IMPEDANCE MEASURED VALUE: 64 Ohm
HighPow Impedance: 456 Ohm
HighPow Impedance: 55 Ohm
Implantable Lead Implant Date: 20050810
Implantable Lead Location: 753858
Implantable Lead Location: 753859
Implantable Lead Location: 753860
Implantable Lead Model: 5076
Lead Channel Impedance Value: 4047 Ohm
Lead Channel Impedance Value: 4047 Ohm
Lead Channel Impedance Value: 608 Ohm
Lead Channel Pacing Threshold Pulse Width: 0.4 ms
Lead Channel Pacing Threshold Pulse Width: 1 ms
Lead Channel Sensing Intrinsic Amplitude: 4.625 mV
Lead Channel Sensing Intrinsic Amplitude: 4.625 mV
Lead Channel Setting Pacing Amplitude: 2 V
Lead Channel Setting Pacing Pulse Width: 0.4 ms
Lead Channel Setting Sensing Sensitivity: 0.6 mV
MDC IDC LEAD IMPLANT DT: 20050810
MDC IDC LEAD IMPLANT DT: 20071015
MDC IDC LEAD MODEL: 4194
MDC IDC LEAD MODEL: 6949
MDC IDC MSMT BATTERY VOLTAGE: 2.63 V
MDC IDC MSMT LEADCHNL LV IMPEDANCE VALUE: 456 Ohm
MDC IDC MSMT LEADCHNL LV PACING THRESHOLD AMPLITUDE: 1.75 V
MDC IDC MSMT LEADCHNL RA IMPEDANCE VALUE: 551 Ohm
MDC IDC MSMT LEADCHNL RA PACING THRESHOLD AMPLITUDE: 0.625 V
MDC IDC MSMT LEADCHNL RV PACING THRESHOLD AMPLITUDE: 1.375 V
MDC IDC MSMT LEADCHNL RV PACING THRESHOLD PULSEWIDTH: 0.4 ms
MDC IDC MSMT LEADCHNL RV SENSING INTR AMPL: 9.75 mV
MDC IDC MSMT LEADCHNL RV SENSING INTR AMPL: 9.75 mV
MDC IDC SET LEADCHNL LV PACING AMPLITUDE: 2.25 V
MDC IDC SET LEADCHNL LV PACING PULSEWIDTH: 1 ms
MDC IDC SET LEADCHNL RV PACING AMPLITUDE: 2.75 V
MDC IDC STAT BRADY AP VP PERCENT: 63.07 %
MDC IDC STAT BRADY AS VS PERCENT: 0.19 %

## 2016-03-15 ENCOUNTER — Telehealth: Payer: Self-pay | Admitting: Internal Medicine

## 2016-03-15 NOTE — Telephone Encounter (Signed)
Transmission received- no shocks, normal device function. Battery voltage nearing ERI- Ms. Grasse instructed to have patient call us if he hears tones from his device. She denies redness, swelling, drainage after visualizing Mr. Crepeau site. I encouraged her to call back if she had any further concern with the device site. She verbalizes understanding.

## 2016-03-15 NOTE — Telephone Encounter (Signed)
Ms. Fechner will send a transmission for [place] to review- no alerts have been sent automatically. I will review the transmission once received and call her back. I have advised her to look at the device site for any redness, swelling or drainage (she doesn't think that the site is abnormal, but isn't sure) and I will get feedback when I call regarding the transmission. She is agreeable.

## 2016-03-25 DIAGNOSIS — H8110 Benign paroxysmal vertigo, unspecified ear: Secondary | ICD-10-CM | POA: Diagnosis not present

## 2016-03-25 DIAGNOSIS — J069 Acute upper respiratory infection, unspecified: Secondary | ICD-10-CM | POA: Diagnosis not present

## 2016-04-04 DIAGNOSIS — J449 Chronic obstructive pulmonary disease, unspecified: Secondary | ICD-10-CM | POA: Diagnosis not present

## 2016-04-04 DIAGNOSIS — E7800 Pure hypercholesterolemia, unspecified: Secondary | ICD-10-CM | POA: Diagnosis not present

## 2016-04-04 DIAGNOSIS — I2510 Atherosclerotic heart disease of native coronary artery without angina pectoris: Secondary | ICD-10-CM | POA: Diagnosis not present

## 2016-04-04 DIAGNOSIS — M159 Polyosteoarthritis, unspecified: Secondary | ICD-10-CM | POA: Diagnosis not present

## 2016-04-08 ENCOUNTER — Telehealth: Payer: Self-pay | Admitting: Cardiology

## 2016-04-08 NOTE — Telephone Encounter (Signed)
Spoke w/ pt wif and informed her that pt device has reached ERI. Informed her that pt may here the alert tone go off every day for the next 16 days. After day 16 the alert will time out. This does not mean that the problem has went away the problem is still there and pt will need to keep his appt. With the provider. Informed her that a scheduler will call to schedule an appt w/ the a provider to discuss the procedure and the procedure will be scheduled for another day. Pt wife verbalized understanding.

## 2016-04-28 ENCOUNTER — Other Ambulatory Visit: Payer: Self-pay

## 2016-04-28 ENCOUNTER — Encounter: Payer: Self-pay | Admitting: Internal Medicine

## 2016-04-28 ENCOUNTER — Ambulatory Visit (INDEPENDENT_AMBULATORY_CARE_PROVIDER_SITE_OTHER): Payer: Medicare Other | Admitting: Internal Medicine

## 2016-04-28 VITALS — BP 108/72 | HR 75 | Ht 68.0 in | Wt 135.0 lb

## 2016-04-28 DIAGNOSIS — I472 Ventricular tachycardia, unspecified: Secondary | ICD-10-CM

## 2016-04-28 DIAGNOSIS — Z95810 Presence of automatic (implantable) cardiac defibrillator: Secondary | ICD-10-CM

## 2016-04-28 DIAGNOSIS — I255 Ischemic cardiomyopathy: Secondary | ICD-10-CM | POA: Diagnosis not present

## 2016-04-28 LAB — CUP PACEART INCLINIC DEVICE CHECK
Battery Voltage: 2.62 V
Brady Statistic AP VP Percent: 64.91 %
Brady Statistic AP VS Percent: 0.02 %
Brady Statistic RV Percent Paced: 99.83 %
HighPow Impedance: 456 Ohm
HighPow Impedance: 63 Ohm
HighPow Impedance: 79 Ohm
Implantable Lead Implant Date: 20050810
Implantable Lead Location: 753859
Implantable Lead Model: 4194
Implantable Lead Model: 6949
Lead Channel Impedance Value: 4047 Ohm
Lead Channel Impedance Value: 570 Ohm
Lead Channel Pacing Threshold Amplitude: 0.5 V
Lead Channel Pacing Threshold Amplitude: 1.5 V
Lead Channel Pacing Threshold Pulse Width: 0.4 ms
Lead Channel Pacing Threshold Pulse Width: 1 ms
Lead Channel Sensing Intrinsic Amplitude: 10.5 mV
Lead Channel Setting Pacing Amplitude: 2 V
Lead Channel Setting Pacing Amplitude: 2 V
Lead Channel Setting Pacing Pulse Width: 1 ms
Lead Channel Setting Sensing Sensitivity: 0.6 mV
MDC IDC LEAD IMPLANT DT: 20050810
MDC IDC LEAD IMPLANT DT: 20071015
MDC IDC LEAD LOCATION: 753858
MDC IDC LEAD LOCATION: 753860
MDC IDC MSMT LEADCHNL LV IMPEDANCE VALUE: 4047 Ohm
MDC IDC MSMT LEADCHNL LV IMPEDANCE VALUE: 513 Ohm
MDC IDC MSMT LEADCHNL LV PACING THRESHOLD AMPLITUDE: 1.5 V
MDC IDC MSMT LEADCHNL RA PACING THRESHOLD PULSEWIDTH: 0.4 ms
MDC IDC MSMT LEADCHNL RA SENSING INTR AMPL: 5.75 mV
MDC IDC MSMT LEADCHNL RV IMPEDANCE VALUE: 608 Ohm
MDC IDC SESS DTM: 20170713165455
MDC IDC SET LEADCHNL RV PACING AMPLITUDE: 3 V
MDC IDC SET LEADCHNL RV PACING PULSEWIDTH: 0.4 ms
MDC IDC STAT BRADY AS VP PERCENT: 34.92 %
MDC IDC STAT BRADY AS VS PERCENT: 0.14 %
MDC IDC STAT BRADY RA PERCENT PACED: 64.93 %

## 2016-04-28 LAB — CBC WITH DIFFERENTIAL/PLATELET
BASOS ABS: 0 {cells}/uL (ref 0–200)
BASOS PCT: 0 %
EOS PCT: 1 %
Eosinophils Absolute: 76 cells/uL (ref 15–500)
HCT: 39.5 % (ref 38.5–50.0)
HEMOGLOBIN: 13.4 g/dL (ref 13.2–17.1)
LYMPHS ABS: 988 {cells}/uL (ref 850–3900)
Lymphocytes Relative: 13 %
MCH: 30 pg (ref 27.0–33.0)
MCHC: 33.9 g/dL (ref 32.0–36.0)
MCV: 88.4 fL (ref 80.0–100.0)
MONOS PCT: 8 %
MPV: 9.1 fL (ref 7.5–12.5)
Monocytes Absolute: 608 cells/uL (ref 200–950)
NEUTROS ABS: 5928 {cells}/uL (ref 1500–7800)
Neutrophils Relative %: 78 %
PLATELETS: 183 10*3/uL (ref 140–400)
RBC: 4.47 MIL/uL (ref 4.20–5.80)
RDW: 14.2 % (ref 11.0–15.0)
WBC: 7.6 10*3/uL (ref 3.8–10.8)

## 2016-04-28 NOTE — Patient Instructions (Addendum)
Medication Instructions:  Your physician recommends that you continue on your current medications as directed. Please refer to the Current Medication list given to you today.   Labwork: Your physician recommends that you return for lab work today: BMP/CBC   Testing/Procedures: Bi V Pacemaker generator change out 05/03/16 Please arrive at the [place] [facility] at [place] not eat or drink after midnight the night before your procedure No medications the morning of the procedure Use scrub as directed  Follow-Up: Your physician recommends that you schedule a follow-up appointment in: 10-14 days from 05/03/16 with devcie clinic for wound check and  3 months from 7/18/17with Dr Lovena Le    Any Other Special Instructions Will Be Listed Below (If Applicable).     If you need a refill on your cardiac medications before your next appointment, please call your pharmacy.

## 2016-04-28 NOTE — Progress Notes (Signed)
HPI [name] returns today for followup. He is a pleasant 75 yo man with a h/o an ICM, chronic systolic CHF, s/p BiV ICD implant. He has had severe LV dysfunction since 2005 but did have improvement in 2009. His QRS duration has gradually increased over the past 6 years. He c/o feeling fatigued, sob, and weak. At other times, he will have some chest pressure. Unclear whether exertion related or not. He is sedentary. He has not had an ICD shock. He denies medical or dietary compliance issues. With these symptoms he had a stress test which demonstrated intermediate risk, then a cath which demonstrated no new obstruction, followed by a pulmonary referral which demonstrated severe COPD with obstructive disease. He has been on medical therapy under the direction of Dr. Lenna Gilford. The patient has improved. He has reached ERI on his BiV ICD. He has never had an ICD shock, despite inducible PMVT at EP testing when his EF was 20%. It is now thought to be 50-55%.     No Known Allergies   Current Outpatient Prescriptions  Medication Sig Dispense Refill  . albuterol (PROVENTIL HFA;VENTOLIN HFA) 108 (90 Base) MCG/ACT inhaler Inhale 2 puffs into the lungs every 6 (six) hours as needed for wheezing or shortness of breath. 1 Inhaler 11  . aspirin 81 MG tablet Take 81 mg by mouth daily.     . calcium-vitamin D (OSCAL WITH D) 500-200 MG-UNIT tablet Take 1 tablet by mouth daily.    . carvedilol (COREG) 25 MG tablet Take 1 tablet (25 mg total) by mouth 2 (two) times daily with a meal. 180 tablet 3  . eplerenone (INSPRA) 25 MG tablet Take 1 tablet (25 mg total) by mouth daily. 90 tablet 3  . fluticasone furoate-vilanterol (BREO ELLIPTA) 100-25 MCG/INH AEPB Inhale 1 puff into the lungs daily. 60 each 11  . furosemide (LASIX) 20 MG tablet Take 3 tablets (60 mg total) by mouth daily. 360 tablet 3  . Hydrocortisone-Aloe Vera (CORTIZONE-10 PLUS EX) Apply 1 application topically as directed. Use as needed    . levothyroxine  (SYNTHROID, LEVOTHROID) 112 MCG tablet Take 112 mcg by mouth daily before breakfast.    . Multiple Vitamins-Minerals (MULTIVITAMIN WITH MINERALS) tablet Take 1 tablet by mouth daily.    . niacin 500 MG tablet Take 500 mg by mouth daily with breakfast.      . omeprazole (PRILOSEC) 40 MG capsule Take 40 mg by mouth daily.    . simvastatin (ZOCOR) 20 MG tablet Take 1 tablet (20 mg total) by mouth at bedtime. 90 tablet 3  . trandolapril (MAVIK) 2 MG tablet Take 3 mg by mouth daily.     No current facility-administered medications for this visit.     Past Medical History  Diagnosis Date  . Ischemic cardiomyopathy      status post non-ST elevation microinfarction stent to the right coronary artery is a   . LV dysfunction      NYHA class I/prior ejection fraction 20-25% and an ejection fraction 7 2009 50-55%, ejection fraction 60% bedside echocardiogram July 2012   . Ventricular tachycardia ([place])     Inducible ventricular polymorphic tachycardia status post ICD followed by upgrade with CRT-D 2007, battery change at 01/31/2011  . Drug-induced gynecomastia      secondary to spironolactone  . Carotid artery disease (HCC)      less than 50% stenosis bilaterally  . Aortic insufficiency      moderate aortic ins moderate/asymptomatic/normal LV cavity size.   Marland Kitchen  Dyslipidemia   . Pulmonary embolism (HCC)     Prior history of pulmonary embolism off Coumadin.  . ICD (implantable cardiac defibrillator), biventricular, in situ      Medtronic model  [id number]  . AICD (automatic cardioverter/defibrillator) present   . PONV (postoperative nausea and vomiting)     ROS:   All systems reviewed and negative except as noted in the HPI.   Past Surgical History  Procedure Laterality Date  . Thoracotomy      left  . Coronary stent placement    . Retropubic prostatectomy    . Inguinal hernia repair    . Cardiac defibrillator placement      medtronic, remote - yes  . Left breast biopsy    . Cardiac  catheterization    . Thyroidectomy    . Cataract extraction w/phaco Left 08/25/2014    Procedure: CATARACT EXTRACTION PHACO AND INTRAOCULAR LENS PLACEMENT (IOC);  Surgeon: Tonny Branch, MD;  Location: [facility];  Service: Ophthalmology;  Laterality: Left;  CDE 5.79  . Cataract extraction w/phaco Right 09/18/2014    Procedure: CATARACT EXTRACTION PHACO AND INTRAOCULAR LENS PLACEMENT RIGHT EYE;  Surgeon: Tonny Branch, MD;  Location: [facility];  Service: Ophthalmology;  Laterality: Right;  CDE:3.35  . Cardiac catheterization N/A 12/10/2015    Procedure: Right/Left Heart Cath and Coronary Angiography;  Surgeon: Peter M Martinique, MD;  Location: [facility];  Service: Cardiovascular;  Laterality: N/A;     Family History  Problem Relation Age of Onset  . Diabetes type I Sister     daughter   . Clotting disorder Neg Hx     also no repiratory disease   . Prostate cancer Neg Hx   . Lung cancer Brother     several types of cancer  . Lung cancer Brother     several types of cancer  . Lung cancer Brother      Social History   Social History  . Marital Status: Married    Spouse Name: N/A  . Number of Children: 2  . Years of Education: N/A   Occupational History  . plumber - self employed    Social History Main Topics  . Smoking status: Former Smoker -- 1.00 packs/day for 43 years    Types: Cigarettes    Start date: 10/17/1957    Quit date: 12/13/2000  . Smokeless tobacco: Never Used     Comment: started at age 3  . Alcohol Use: No  . Drug Use: No  . Sexual Activity: Not on file   Other Topics Concern  . Not on file   Social History Narrative   Self employed Development worker, community; 2 daughters.      BP 108/72 mmHg  Pulse 75  Ht 5\' 8"  (1.727 m)  Wt 135 lb (61.236 kg)  BMI 20.53 kg/m2  SpO2 94%  Physical Exam:  stable appearing 75 yo man, NAD HEENT: Unremarkable Neck:  6 cm JVD, no thyromegally Back:  No CVA tenderness Lungs:  Clear with no wheezes, well healed ICD incision. Reduced  lung sounds. HEART:  Regular rate rhythm, no murmurs, no rubs, no clicks Abd:  soft, positive bowel sounds, no organomegally, no rebound, no guarding Ext:  2 plus pulses, no edema, no cyanosis, no clubbing Skin:  No rashes no nodules Neuro:  CN II through XII intact, motor grossly intact   DEVICE  Normal device function.  See PaceArt for details. Device at [employer].  Assess/Plan: 1. ICD - his BiV device is  at [facility]. I have recommended change out to a Biv PPM. 2. Chronic systolic heart failure - his EF has nearly normalized. He no longer needs an ICD. He is still biv pacing. He will continue his current meds. 3. COPD - he will continue his bronchodilators. 4. CHB - he is paced 99% of the time.   Mikle Bosworth.D.

## 2016-04-29 ENCOUNTER — Telehealth: Payer: Self-pay | Admitting: Internal Medicine

## 2016-04-29 DIAGNOSIS — J069 Acute upper respiratory infection, unspecified: Secondary | ICD-10-CM | POA: Diagnosis not present

## 2016-04-29 DIAGNOSIS — J449 Chronic obstructive pulmonary disease, unspecified: Secondary | ICD-10-CM | POA: Diagnosis not present

## 2016-04-29 DIAGNOSIS — Z299 Encounter for prophylactic measures, unspecified: Secondary | ICD-10-CM | POA: Diagnosis not present

## 2016-04-29 DIAGNOSIS — R42 Dizziness and giddiness: Secondary | ICD-10-CM | POA: Diagnosis not present

## 2016-04-29 LAB — BASIC METABOLIC PANEL
BUN: 33 mg/dL — ABNORMAL HIGH (ref 7–25)
CALCIUM: 9.4 mg/dL (ref 8.6–10.3)
CO2: 26 mmol/L (ref 20–31)
Chloride: 102 mmol/L (ref 98–110)
Creat: 1.79 mg/dL — ABNORMAL HIGH (ref 0.70–1.18)
Glucose, Bld: 97 mg/dL (ref 65–99)
Potassium: 4.6 mmol/L (ref 3.5–5.3)
SODIUM: 140 mmol/L (ref 135–146)

## 2016-04-29 NOTE — Telephone Encounter (Signed)
Returned patient's daughter's call.  Spoke with [name], who reports she is a Marine scientist.  She is concerned that as patient already has "all of the wires" in place for an ICD, yet is having his device changed out to a CRT-P.  Reviewed Dr. Tanna Furry note from 04/28/16 with [name], advising that per Dr. Lovena Le, patient's EF has nearly normalized and he no longer needs an ICD.  [name] states that she would prefer the patient to have an ICD "just in case" and that the patient and her mother also do not understand why this is not possible.  She states that they are not "medical people", so they did not ask any questions at the appointment.  Attempted to explain that Dr. Tanna Furry recommendations are based on current guidelines/recommendations, [name] states she understands but that she would prefer to hear Dr. Tanna Furry recommendations directly as she was not able to come to patient's appointment on 7/13.  Advised that Dr. Lovena Le is not going to be back in the office until Monday, 7/17, and patient's daughter is agreeable to waiting until then.  She is appreciative of assistance and denies additional device-related questions or concerns at this time.  Routed to Dr. Lovena Le and Janan Halter, RN for review.

## 2016-04-29 NOTE — Telephone Encounter (Signed)
New message      Pt is due to have a pacemaker replaced on tues----daughter has questions about the procedure

## 2016-05-02 ENCOUNTER — Other Ambulatory Visit: Payer: Self-pay | Admitting: Physician Assistant

## 2016-05-02 ENCOUNTER — Ambulatory Visit (INDEPENDENT_AMBULATORY_CARE_PROVIDER_SITE_OTHER): Payer: Medicare Other | Admitting: *Deleted

## 2016-05-02 DIAGNOSIS — I472 Ventricular tachycardia, unspecified: Secondary | ICD-10-CM

## 2016-05-02 DIAGNOSIS — I255 Ischemic cardiomyopathy: Secondary | ICD-10-CM

## 2016-05-02 DIAGNOSIS — Z95810 Presence of automatic (implantable) cardiac defibrillator: Secondary | ICD-10-CM

## 2016-05-02 NOTE — Telephone Encounter (Signed)
Dr. Lovena Le called and spoke with dtr

## 2016-05-02 NOTE — Telephone Encounter (Signed)
Follow up     Pt is due to have a device replacement tomorrow.  Family want to know why he is having this procedure tomorrow----no one can answer that question.  Please call

## 2016-05-03 ENCOUNTER — Encounter (HOSPITAL_COMMUNITY)
Admission: RE | Disposition: A | Discharge status: Home / Self Care | Payer: Self-pay | Source: Ambulatory Visit | Attending: Internal Medicine

## 2016-05-03 ENCOUNTER — Ambulatory Visit (HOSPITAL_COMMUNITY)
Admission: RE | Admit: 2016-05-03 | Discharge: 2016-05-03 | Disposition: A | Discharge status: Home / Self Care | Payer: Medicare Other | Source: Ambulatory Visit | Attending: Internal Medicine | Admitting: Internal Medicine

## 2016-05-03 DIAGNOSIS — J449 Chronic obstructive pulmonary disease, unspecified: Secondary | ICD-10-CM | POA: Diagnosis not present

## 2016-05-03 DIAGNOSIS — I447 Left bundle-branch block, unspecified: Secondary | ICD-10-CM | POA: Diagnosis not present

## 2016-05-03 DIAGNOSIS — Z955 Presence of coronary angioplasty implant and graft: Secondary | ICD-10-CM | POA: Diagnosis not present

## 2016-05-03 DIAGNOSIS — I472 Ventricular tachycardia: Secondary | ICD-10-CM | POA: Diagnosis not present

## 2016-05-03 DIAGNOSIS — Z95810 Presence of automatic (implantable) cardiac defibrillator: Secondary | ICD-10-CM

## 2016-05-03 DIAGNOSIS — Z833 Family history of diabetes mellitus: Secondary | ICD-10-CM | POA: Diagnosis not present

## 2016-05-03 DIAGNOSIS — Z7982 Long term (current) use of aspirin: Secondary | ICD-10-CM | POA: Insufficient documentation

## 2016-05-03 DIAGNOSIS — E785 Hyperlipidemia, unspecified: Secondary | ICD-10-CM | POA: Insufficient documentation

## 2016-05-03 DIAGNOSIS — I481 Persistent atrial fibrillation: Secondary | ICD-10-CM | POA: Insufficient documentation

## 2016-05-03 DIAGNOSIS — Z4502 Encounter for adjustment and management of automatic implantable cardiac defibrillator: Secondary | ICD-10-CM | POA: Insufficient documentation

## 2016-05-03 DIAGNOSIS — Z87891 Personal history of nicotine dependence: Secondary | ICD-10-CM | POA: Insufficient documentation

## 2016-05-03 DIAGNOSIS — Z801 Family history of malignant neoplasm of trachea, bronchus and lung: Secondary | ICD-10-CM | POA: Insufficient documentation

## 2016-05-03 DIAGNOSIS — I255 Ischemic cardiomyopathy: Secondary | ICD-10-CM | POA: Diagnosis not present

## 2016-05-03 DIAGNOSIS — I5022 Chronic systolic (congestive) heart failure: Secondary | ICD-10-CM | POA: Diagnosis not present

## 2016-05-03 DIAGNOSIS — Z86711 Personal history of pulmonary embolism: Secondary | ICD-10-CM | POA: Diagnosis not present

## 2016-05-03 HISTORY — PX: EP IMPLANTABLE DEVICE: SHX172B

## 2016-05-03 LAB — SURGICAL PCR SCREEN
MRSA, PCR: NEGATIVE
STAPHYLOCOCCUS AUREUS: NEGATIVE

## 2016-05-03 SURGERY — PPM/BIV PPM GENERATOR CHANGEOUT
Anesthesia: LOCAL

## 2016-05-03 MED ORDER — OXYCODONE-ACETAMINOPHEN 5-325 MG PO TABS
1.0000 | ORAL_TABLET | Freq: Four times a day (QID) | ORAL | Status: DC | PRN
  Administered 2016-05-03: 1 via ORAL

## 2016-05-03 MED ORDER — SODIUM CHLORIDE 0.9 % IV SOLN
INTRAVENOUS | Status: DC
  Administered 2016-05-03: 09:00:00 via INTRAVENOUS

## 2016-05-03 MED ORDER — SODIUM CHLORIDE 0.9 % IR SOLN
Status: AC
  Filled 2016-05-03: qty 2

## 2016-05-03 MED ORDER — FENTANYL CITRATE (PF) 100 MCG/2ML IJ SOLN
INTRAMUSCULAR | Status: DC | PRN
  Administered 2016-05-03 (×5): 12.5 ug via INTRAVENOUS

## 2016-05-03 MED ORDER — CEFAZOLIN SODIUM-DEXTROSE 2-4 GM/100ML-% IV SOLN
INTRAVENOUS | Status: AC
  Filled 2016-05-03: qty 100

## 2016-05-03 MED ORDER — ACETAMINOPHEN 325 MG PO TABS: 325.0000 mg | ORAL_TABLET | ORAL | Status: DC | PRN

## 2016-05-03 MED ORDER — SODIUM CHLORIDE 0.9 % IR SOLN
Status: DC | PRN
  Administered 2016-05-03 (×2)

## 2016-05-03 MED ORDER — LIDOCAINE HCL (PF) 1 % IJ SOLN
INTRAMUSCULAR | Status: AC
  Filled 2016-05-03: qty 30

## 2016-05-03 MED ORDER — MIDAZOLAM HCL 5 MG/5ML IJ SOLN
INTRAMUSCULAR | Status: AC
Start: 2016-05-03 — End: 2016-05-03
  Filled 2016-05-03: qty 5

## 2016-05-03 MED ORDER — LIDOCAINE HCL (PF) 1 % IJ SOLN
INTRAMUSCULAR | Status: AC
Start: 2016-05-03 — End: 2016-05-03
  Filled 2016-05-03: qty 30

## 2016-05-03 MED ORDER — MUPIROCIN 2 % EX OINT: 1.0000 "application " | TOPICAL_OINTMENT | Freq: Once | CUTANEOUS | Status: DC

## 2016-05-03 MED ORDER — ONDANSETRON HCL 4 MG/2ML IJ SOLN: 4.0000 mg | Freq: Four times a day (QID) | INTRAMUSCULAR | Status: DC | PRN

## 2016-05-03 MED ORDER — CEFAZOLIN SODIUM-DEXTROSE 2-4 GM/100ML-% IV SOLN
2.0000 g | INTRAVENOUS | Status: AC
  Administered 2016-05-03: 2 g via INTRAVENOUS
  Filled 2016-05-03: qty 100

## 2016-05-03 MED ORDER — LIDOCAINE HCL (PF) 1 % IJ SOLN
INTRAMUSCULAR | Status: DC | PRN
  Administered 2016-05-03: 64 mL via INTRADERMAL

## 2016-05-03 MED ORDER — MUPIROCIN 2 % EX OINT
TOPICAL_OINTMENT | CUTANEOUS | Status: AC
  Administered 2016-05-03: 1
  Filled 2016-05-03: qty 22

## 2016-05-03 MED ORDER — FENTANYL CITRATE (PF) 100 MCG/2ML IJ SOLN
INTRAMUSCULAR | Status: AC
  Filled 2016-05-03: qty 2

## 2016-05-03 MED ORDER — CHLORHEXIDINE GLUCONATE 4 % EX LIQD
60.0000 mL | Freq: Once | CUTANEOUS | Status: DC
  Filled 2016-05-03: qty 60

## 2016-05-03 MED ORDER — MIDAZOLAM HCL 5 MG/5ML IJ SOLN
INTRAMUSCULAR | Status: DC | PRN
  Administered 2016-05-03 (×5): 1 mg via INTRAVENOUS

## 2016-05-03 MED ORDER — SODIUM CHLORIDE 0.9 % IR SOLN
80.0000 mg | Status: AC
  Administered 2016-05-03: 80 mg
  Filled 2016-05-03: qty 2

## 2016-05-03 MED ORDER — OXYCODONE-ACETAMINOPHEN 5-325 MG PO TABS
ORAL_TABLET | ORAL | Status: AC
  Filled 2016-05-03: qty 1

## 2016-05-03 SURGICAL SUPPLY — 4 items
CABLE SURGICAL S-101-97-12 (CABLE) ×2 IMPLANT
PAD DEFIB LIFELINK (PAD) ×2 IMPLANT
PPM CONSULTA CRT-P C4TR01 (Pacemaker) ×2 IMPLANT
TRAY PACEMAKER INSERTION (PACKS) ×2 IMPLANT

## 2016-05-03 NOTE — H&P (View-Only) (Signed)
HPI [name] returns today for followup. He is a pleasant 75 yo man with a h/o an ICM, chronic systolic CHF, s/p BiV ICD implant. He has had severe LV dysfunction since 2005 but did have improvement in 2009. His QRS duration has gradually increased over the past 6 years. He c/o feeling fatigued, sob, and weak. At other times, he will have some chest pressure. Unclear whether exertion related or not. He is sedentary. He has not had an ICD shock. He denies medical or dietary compliance issues. With these symptoms he had a stress test which demonstrated intermediate risk, then a cath which demonstrated no new obstruction, followed by a pulmonary referral which demonstrated severe COPD with obstructive disease. He has been on medical therapy under the direction of Dr. Lenna Gilford. The patient has improved. He has reached ERI on his BiV ICD. He has never had an ICD shock, despite inducible PMVT at EP testing when his EF was 20%. It is now thought to be 50-55%.     No Known Allergies   Current Outpatient Prescriptions  Medication Sig Dispense Refill  . albuterol (PROVENTIL HFA;VENTOLIN HFA) 108 (90 Base) MCG/ACT inhaler Inhale 2 puffs into the lungs every 6 (six) hours as needed for wheezing or shortness of breath. 1 Inhaler 11  . aspirin 81 MG tablet Take 81 mg by mouth daily.     . calcium-vitamin D (OSCAL WITH D) 500-200 MG-UNIT tablet Take 1 tablet by mouth daily.    . carvedilol (COREG) 25 MG tablet Take 1 tablet (25 mg total) by mouth 2 (two) times daily with a meal. 180 tablet 3  . eplerenone (INSPRA) 25 MG tablet Take 1 tablet (25 mg total) by mouth daily. 90 tablet 3  . fluticasone furoate-vilanterol (BREO ELLIPTA) 100-25 MCG/INH AEPB Inhale 1 puff into the lungs daily. 60 each 11  . furosemide (LASIX) 20 MG tablet Take 3 tablets (60 mg total) by mouth daily. 360 tablet 3  . Hydrocortisone-Aloe Vera (CORTIZONE-10 PLUS EX) Apply 1 application topically as directed. Use as needed    . levothyroxine  (SYNTHROID, LEVOTHROID) 112 MCG tablet Take 112 mcg by mouth daily before breakfast.    . Multiple Vitamins-Minerals (MULTIVITAMIN WITH MINERALS) tablet Take 1 tablet by mouth daily.    . niacin 500 MG tablet Take 500 mg by mouth daily with breakfast.      . omeprazole (PRILOSEC) 40 MG capsule Take 40 mg by mouth daily.    . simvastatin (ZOCOR) 20 MG tablet Take 1 tablet (20 mg total) by mouth at bedtime. 90 tablet 3  . trandolapril (MAVIK) 2 MG tablet Take 3 mg by mouth daily.     No current facility-administered medications for this visit.     Past Medical History  Diagnosis Date  . Ischemic cardiomyopathy      status post non-ST elevation microinfarction stent to the right coronary artery is a   . LV dysfunction      NYHA class I/prior ejection fraction 20-25% and an ejection fraction 7 2009 50-55%, ejection fraction 60% bedside echocardiogram July 2012   . Ventricular tachycardia ([place])     Inducible ventricular polymorphic tachycardia status post ICD followed by upgrade with CRT-D 2007, battery change at 01/31/2011  . Drug-induced gynecomastia      secondary to spironolactone  . Carotid artery disease (HCC)      less than 50% stenosis bilaterally  . Aortic insufficiency      moderate aortic ins moderate/asymptomatic/normal LV cavity size.   Marland Kitchen  Dyslipidemia   . Pulmonary embolism (HCC)     Prior history of pulmonary embolism off Coumadin.  . ICD (implantable cardiac defibrillator), biventricular, in situ      Medtronic model  D314TRG  . AICD (automatic cardioverter/defibrillator) present   . PONV (postoperative nausea and vomiting)     ROS:   All systems reviewed and negative except as noted in the HPI.   Past Surgical History  Procedure Laterality Date  . Thoracotomy      left  . Coronary stent placement    . Retropubic prostatectomy    . Inguinal hernia repair    . Cardiac defibrillator placement      medtronic, remote - yes  . Left breast biopsy    . Cardiac  catheterization    . Thyroidectomy    . Cataract extraction w/phaco Left 08/25/2014    Procedure: CATARACT EXTRACTION PHACO AND INTRAOCULAR LENS PLACEMENT (IOC);  Surgeon: Tonny Branch, MD;  Location: [facility];  Service: Ophthalmology;  Laterality: Left;  CDE 5.79  . Cataract extraction w/phaco Right 09/18/2014    Procedure: CATARACT EXTRACTION PHACO AND INTRAOCULAR LENS PLACEMENT RIGHT EYE;  Surgeon: Tonny Branch, MD;  Location: [facility];  Service: Ophthalmology;  Laterality: Right;  CDE:3.35  . Cardiac catheterization N/A 12/10/2015    Procedure: Right/Left Heart Cath and Coronary Angiography;  Surgeon: Peter M Martinique, MD;  Location: [place] [facility];  Service: Cardiovascular;  Laterality: N/A;     Family History  Problem Relation Age of Onset  . Diabetes type I Sister     daughter   . Clotting disorder Neg Hx     also no repiratory disease   . Prostate cancer Neg Hx   . Lung cancer Brother     several types of cancer  . Lung cancer Brother     several types of cancer  . Lung cancer Brother      Social History   Social History  . Marital Status: Married    Spouse Name: N/A  . Number of Children: 2  . Years of Education: N/A   Occupational History  . plumber - self employed    Social History Main Topics  . Smoking status: Former Smoker -- 1.00 packs/day for 43 years    Types: Cigarettes    Start date: 10/17/1957    Quit date: 12/13/2000  . Smokeless tobacco: Never Used     Comment: started at age 59  . Alcohol Use: No  . Drug Use: No  . Sexual Activity: Not on file   Other Topics Concern  . Not on file   Social History Narrative   Self employed Development worker, community; 2 daughters.      BP 108/72 mmHg  Pulse 75  Ht 5\' 8"  (1.727 m)  Wt 135 lb (61.236 kg)  BMI 20.53 kg/m2  SpO2 94%  Physical Exam:  stable appearing 75 yo man, NAD HEENT: Unremarkable Neck:  6 cm JVD, no thyromegally Back:  No CVA tenderness Lungs:  Clear with no wheezes, well healed ICD incision. Reduced  lung sounds. HEART:  Regular rate rhythm, no murmurs, no rubs, no clicks Abd:  soft, positive bowel sounds, no organomegally, no rebound, no guarding Ext:  2 plus pulses, no edema, no cyanosis, no clubbing Skin:  No rashes no nodules Neuro:  CN II through XII intact, motor grossly intact   DEVICE  Normal device function.  See PaceArt for details. Device at [employer].  Assess/Plan: 1. ICD - his BiV device is  at [facility]. I have recommended change out to a Biv PPM. 2. Chronic systolic heart failure - his EF has nearly normalized. He no longer needs an ICD. He is still biv pacing. He will continue his current meds. 3. COPD - he will continue his bronchodilators. 4. CHB - he is paced 99% of the time.   Mikle Bosworth.D.

## 2016-05-03 NOTE — Interval H&P Note (Signed)
History and Physical Interval Note:  05/03/2016 10:30 AM  [name]  has presented today for surgery, with the diagnosis of ERI  The various methods of treatment have been discussed with the patient and family. After consideration of risks, benefits and other options for treatment, the patient has consented to  Procedure(s): BIV PPM Generator Changeout (N/A) as a surgical intervention .  The patient's history has been reviewed, patient examined, no change in status, stable for surgery.  I have reviewed the patient's chart and labs.  Questions were answered to the patient's satisfaction.     Cristopher Peru

## 2016-05-03 NOTE — H&P (Signed)
  ICD Criteria  Current LVEF:50%. Within 12 months prior to implant: Yes   Heart failure history: Yes, Class II  Cardiomyopathy history: Yes, Ischemic Cardiomyopathy.  Atrial Fibrillation/Atrial Flutter: Yes, Persistent (> 7 Days).  Ventricular tachycardia history: No.  Cardiac arrest history: No.  History of syndromes with risk of sudden death: No.  Previous ICD: Yes, Reason for ICD:  Primary prevention.  Current ICD indication: none - he will undergo downgraded to a BiV PPM  PPM indication: Yes. Pacing type: Both. Greater than 40% RV pacing requirement anticipated. Indication: Sick Sinus Syndrome   Class I or II Bradycardia indication present: Yes  Beta Blocker therapy for 3 or more months: Yes, prescribed.   Ace Inhibitor/ARB therapy for 3 or more months: Yes, prescribed.

## 2016-05-03 NOTE — Progress Notes (Signed)
Pt in Cath Lab Holding awaiting bed assignment in short stay.  Pt awake and oriented.  Pt has pressure dressing on site, site level 0.

## 2016-05-03 NOTE — Discharge Instructions (Signed)

## 2016-05-04 ENCOUNTER — Encounter (HOSPITAL_COMMUNITY): Payer: Self-pay | Admitting: Internal Medicine

## 2016-05-04 LAB — CUP PACEART REMOTE DEVICE CHECK
Battery Voltage: 2.62 V
Brady Statistic AP VP Percent: 70.24 %
Brady Statistic AS VP Percent: 29.46 %
Brady Statistic RA Percent Paced: 70.26 %
HIGH POWER IMPEDANCE MEASURED VALUE: 64 Ohm
HighPow Impedance: 513 Ohm
HighPow Impedance: 82 Ohm
Lead Channel Impedance Value: 4047 Ohm
Lead Channel Impedance Value: 608 Ohm
Lead Channel Pacing Threshold Amplitude: 0.5 V
Lead Channel Pacing Threshold Amplitude: 1.5 V
Lead Channel Pacing Threshold Pulse Width: 0.4 ms
Lead Channel Sensing Intrinsic Amplitude: 4.875 mV
Lead Channel Sensing Intrinsic Amplitude: 4.875 mV
Lead Channel Setting Pacing Amplitude: 2 V
Lead Channel Setting Sensing Sensitivity: 0.6 mV
MDC IDC MSMT LEADCHNL LV IMPEDANCE VALUE: 4047 Ohm
MDC IDC MSMT LEADCHNL LV IMPEDANCE VALUE: 570 Ohm
MDC IDC MSMT LEADCHNL LV PACING THRESHOLD AMPLITUDE: 1.5 V
MDC IDC MSMT LEADCHNL LV PACING THRESHOLD PULSEWIDTH: 1 ms
MDC IDC MSMT LEADCHNL RA PACING THRESHOLD PULSEWIDTH: 0.4 ms
MDC IDC MSMT LEADCHNL RV IMPEDANCE VALUE: 646 Ohm
MDC IDC MSMT LEADCHNL RV SENSING INTR AMPL: 9.75 mV
MDC IDC MSMT LEADCHNL RV SENSING INTR AMPL: 9.75 mV
MDC IDC SESS DTM: 20170717041808
MDC IDC SET LEADCHNL LV PACING AMPLITUDE: 2 V
MDC IDC SET LEADCHNL LV PACING PULSEWIDTH: 1 ms
MDC IDC SET LEADCHNL RV PACING AMPLITUDE: 3 V
MDC IDC SET LEADCHNL RV PACING PULSEWIDTH: 0.4 ms
MDC IDC STAT BRADY AP VS PERCENT: 0.02 %
MDC IDC STAT BRADY AS VS PERCENT: 0.28 %
MDC IDC STAT BRADY RV PERCENT PACED: 99.7 %

## 2016-05-05 ENCOUNTER — Ambulatory Visit: Payer: Medicare Other | Admitting: Pulmonary Disease

## 2016-05-16 ENCOUNTER — Ambulatory Visit (INDEPENDENT_AMBULATORY_CARE_PROVIDER_SITE_OTHER): Payer: Medicare Other | Admitting: *Deleted

## 2016-05-16 ENCOUNTER — Encounter: Payer: Self-pay | Admitting: Internal Medicine

## 2016-05-16 ENCOUNTER — Telehealth: Payer: Self-pay | Admitting: *Deleted

## 2016-05-16 VITALS — BP 99/52

## 2016-05-16 DIAGNOSIS — I5022 Chronic systolic (congestive) heart failure: Secondary | ICD-10-CM | POA: Diagnosis not present

## 2016-05-16 DIAGNOSIS — Z950 Presence of cardiac pacemaker: Secondary | ICD-10-CM | POA: Diagnosis not present

## 2016-05-16 LAB — CUP PACEART INCLINIC DEVICE CHECK
Brady Statistic AP VP Percent: 47.32 %
Brady Statistic AP VS Percent: 0 %
Brady Statistic AS VP Percent: 52.57 %
Brady Statistic AS VS Percent: 0.11 %
Implantable Lead Implant Date: 20050810
Implantable Lead Location: 753859
Implantable Lead Model: 5076
Lead Channel Impedance Value: 399 Ohm
Lead Channel Impedance Value: 551 Ohm
Lead Channel Impedance Value: 722 Ohm
Lead Channel Pacing Threshold Amplitude: 0.5 V
Lead Channel Pacing Threshold Pulse Width: 0.4 ms
Lead Channel Sensing Intrinsic Amplitude: 10 mV
Lead Channel Setting Pacing Amplitude: 1.5 V
Lead Channel Setting Pacing Amplitude: 2.5 V
Lead Channel Setting Pacing Pulse Width: 1 ms
Lead Channel Setting Sensing Sensitivity: 2.8 mV
MDC IDC LEAD IMPLANT DT: 20050810
MDC IDC LEAD IMPLANT DT: 20071015
MDC IDC LEAD LOCATION: 753858
MDC IDC LEAD LOCATION: 753860
MDC IDC LEAD MODEL: 4194
MDC IDC LEAD MODEL: 6949
MDC IDC MSMT BATTERY VOLTAGE: 3.07 V
MDC IDC MSMT LEADCHNL LV IMPEDANCE VALUE: 247 Ohm
MDC IDC MSMT LEADCHNL LV IMPEDANCE VALUE: 627 Ohm
MDC IDC MSMT LEADCHNL LV PACING THRESHOLD AMPLITUDE: 2 V
MDC IDC MSMT LEADCHNL LV PACING THRESHOLD PULSEWIDTH: 1 ms
MDC IDC MSMT LEADCHNL RA IMPEDANCE VALUE: 475 Ohm
MDC IDC MSMT LEADCHNL RA IMPEDANCE VALUE: 532 Ohm
MDC IDC MSMT LEADCHNL RA SENSING INTR AMPL: 6 mV
MDC IDC MSMT LEADCHNL RV IMPEDANCE VALUE: 456 Ohm
MDC IDC MSMT LEADCHNL RV IMPEDANCE VALUE: 570 Ohm
MDC IDC MSMT LEADCHNL RV PACING THRESHOLD AMPLITUDE: 1.25 V
MDC IDC MSMT LEADCHNL RV PACING THRESHOLD PULSEWIDTH: 0.4 ms
MDC IDC SESS DTM: 20170731142359
MDC IDC SET LEADCHNL LV PACING AMPLITUDE: 3 V
MDC IDC SET LEADCHNL RV PACING PULSEWIDTH: 0.4 ms
MDC IDC STAT BRADY RA PERCENT PACED: 47.32 %
MDC IDC STAT BRADY RV PERCENT PACED: 99.89 %

## 2016-05-16 LAB — BASIC METABOLIC PANEL
BUN: 24 mg/dL (ref 7–25)
CALCIUM: 9.4 mg/dL (ref 8.6–10.3)
CO2: 32 mmol/L — ABNORMAL HIGH (ref 20–31)
CREATININE: 1.09 mg/dL (ref 0.70–1.18)
Chloride: 100 mmol/L (ref 98–110)
Glucose, Bld: 73 mg/dL (ref 65–99)
Potassium: 4.1 mmol/L (ref 3.5–5.3)
Sodium: 140 mmol/L (ref 135–146)

## 2016-05-16 NOTE — Telephone Encounter (Signed)
Called patient to make him aware that I reviewed his symptoms and today's B/P of 99/52 with Dr. Caryl Comes, who recommended that he schedule a follow-up appointment with Tommye Standard, PA.  Patient is agreeable to an appointment to discuss his hypotension and fatigue on 05/19/16 at 2:00pm.  He is aware that this appointment is at the [facility] office.  Patient is aware to call if his symptoms worsen in the meantime.  Patient is appreciative of call and denies additional questions or concerns at this tim.

## 2016-05-16 NOTE — Progress Notes (Signed)
Wound check appointment, s/p downgrade to CRT-P on 05/03/16. Steri-strips removed. Wound without redness. Slight edema noted over site, soft and nontender to palpation. Incision edges approximated, wound well healed. Normal device function. Thresholds, sensing, and impedances consistent with implant measurements. Device programmed at chronic outputs. Histogram distribution appropriate for patient and level of activity. No mode switches. 2 VT episodes--longest 2sec, episodes do not correlate with symptoms. Patient educated about wound care, arm mobility, lifting restrictions. ROV with GT/R in 3 months.  Patient reports dizziness with positional changes which he feels has worsened since his device changeout.  B/P 99/52 today (sitting).  He has also had days recently where he has just felt "bad".  Patient does not correlate these dates/times to NSVT episodes noted on device check.  Advised patient to ensure he is taking in enough fluids as he has not had much to drink yet today.  Patient had BMET drawn as ordered.  Patient and wife aware to call if his symptoms worsen.  Will review with Dr. Caryl Comes (DOD) for further recommendations.

## 2016-05-19 ENCOUNTER — Ambulatory Visit (INDEPENDENT_AMBULATORY_CARE_PROVIDER_SITE_OTHER): Payer: Medicare Other | Admitting: Physician Assistant

## 2016-05-19 ENCOUNTER — Encounter: Payer: Self-pay | Admitting: Physician Assistant

## 2016-05-19 ENCOUNTER — Encounter (INDEPENDENT_AMBULATORY_CARE_PROVIDER_SITE_OTHER): Payer: Self-pay

## 2016-05-19 VITALS — BP 108/60 | HR 62 | Ht 69.0 in | Wt 140.0 lb

## 2016-05-19 DIAGNOSIS — I255 Ischemic cardiomyopathy: Secondary | ICD-10-CM

## 2016-05-19 DIAGNOSIS — I2510 Atherosclerotic heart disease of native coronary artery without angina pectoris: Secondary | ICD-10-CM

## 2016-05-19 DIAGNOSIS — I5022 Chronic systolic (congestive) heart failure: Secondary | ICD-10-CM | POA: Diagnosis not present

## 2016-05-19 NOTE — Progress Notes (Signed)
Cardiology Office Note Date:  05/19/2016  Patient ID:  [name], Service August 21, 1941, MRN [id number] PCP:  Glenda Chroman, MD  Cardiologist/Electrophysiologist: Dr. Lovena Le Pulmonary: Dr. Lenna Gilford    Chief Complaint: low BP  History of Present Illness: [name] is a 75 y.o. male with history of ICM, chronic systolic CHF w/ICD, CAD with PCI to RCA remotely, severe COPD.  He comes in today to be seen for Dr. Lovena Le.  He recently underwent ICD gen change  05/03/16.  He reported at his wound check weakness and observed low BP at that visit, and comes in for evaluation.    At his last out patient visit with Dr. Lovena Le, he noted he has had severe LV dysfunction since 2005 but did have improvement in 2009. His QRS duration has gradually increased over the past 6 years. He had c/o feeling fatigued, sob, and weak. At other times, he will have some chest pressure. Unclear whether exertion related or not. He is sedentary. He has not had an ICD shock. He denies medical or dietary compliance issues. With these symptoms he had a stress test which demonstrated intermediate risk, then a cath 12/10/15 which demonstrated no new obstruction, followed by a pulmonary referral which demonstrated severe COPD   He reports the day of his wound check particularly had felt weak and tired, since then though has tried to keep better hydrated and has felt better.  He has not had any near syncope or syncope.  He is accompanied by his wife today, they report his breathing is significantly better with pulmonary management, he will occasionaly at night get some slight twinges of the same chest discomfort, no exertional pain, and this dates  Back prior to his cath with no obstructive CAD.  He denies any pain at his PPM site, no fever chills, or signs of infection.   Device information:  MDT downgrade to CRT-P at his gen change 05/03/16, Dr. Lovena Le, [place] lead 07/31/06, original implant 05/26/04 NOTE: LV lead in in RV port for  sensing.  RV lead (6949 lead) is in LV port with chronic high threshold    Past Medical History:  Diagnosis Date  . AICD (automatic cardioverter/defibrillator) present   . Aortic insufficiency     moderate aortic ins moderate/asymptomatic/normal LV cavity size.   . Carotid artery disease ([place])     less than 50% stenosis bilaterally  . Drug-induced gynecomastia     secondary to spironolactone  . Dyslipidemia   . ICD (implantable cardiac defibrillator), biventricular, in situ     Medtronic model  [id number]  . Ischemic cardiomyopathy     status post non-ST elevation microinfarction stent to the right coronary artery is a   . LV dysfunction     NYHA class I/prior ejection fraction 20-25% and an ejection fraction 7 2009 50-55%, ejection fraction 60% bedside echocardiogram July 2012   . PONV (postoperative nausea and vomiting)   . Pulmonary embolism ([facility])    Prior history of pulmonary embolism off Coumadin.  . Ventricular tachycardia ([place])    Inducible ventricular polymorphic tachycardia status post ICD followed by upgrade with CRT-D 2007, battery change at 01/31/2011    Past Surgical History:  Procedure Laterality Date  . CARDIAC CATHETERIZATION    . CARDIAC CATHETERIZATION N/A 12/10/2015   Procedure: Right/Left Heart Cath and Coronary Angiography;  Surgeon: Peter M Martinique, MD;  Location: [facility];  Service: Cardiovascular;  Laterality: N/A;  . CARDIAC DEFIBRILLATOR PLACEMENT  medtronic, remote - yes  . CATARACT EXTRACTION W/PHACO Left 08/25/2014   Procedure: CATARACT EXTRACTION PHACO AND INTRAOCULAR LENS PLACEMENT (IOC);  Surgeon: Tonny Branch, MD;  Location: [facility];  Service: Ophthalmology;  Laterality: Left;  CDE 5.79  . CATARACT EXTRACTION W/PHACO Right 09/18/2014   Procedure: CATARACT EXTRACTION PHACO AND INTRAOCULAR LENS PLACEMENT RIGHT EYE;  Surgeon: Tonny Branch, MD;  Location: [facility];  Service: Ophthalmology;  Laterality: Right;  CDE:3.35  . CORONARY STENT PLACEMENT      . EP IMPLANTABLE DEVICE N/A 05/03/2016   Procedure: BIV PPM Generator Changeout;  Surgeon: Evans Lance, MD;  Location: [facility];  Service: Cardiovascular;  Laterality: N/A;  . INGUINAL HERNIA REPAIR    . left breast biopsy    . RETROPUBIC PROSTATECTOMY    . THORACOTOMY     left  . THYROIDECTOMY      Current Outpatient Prescriptions  Medication Sig Dispense Refill  . aspirin 81 MG tablet Take 81 mg by mouth daily.     . calcium-vitamin D (OSCAL WITH D) 500-200 MG-UNIT tablet Take 1 tablet by mouth daily.    . carvedilol (COREG) 25 MG tablet Take 1 tablet (25 mg total) by mouth 2 (two) times daily with a meal. 180 tablet 3  . eplerenone (INSPRA) 25 MG tablet Take 1 tablet (25 mg total) by mouth daily. 90 tablet 3  . fluticasone furoate-vilanterol (BREO ELLIPTA) 100-25 MCG/INH AEPB Inhale 1 puff into the lungs daily. 60 each 11  . furosemide (LASIX) 20 MG tablet Take 3 tablets (60 mg total) by mouth daily. (Patient taking differently: Take 40 mg by mouth daily. ) 360 tablet 3  . Hydrocortisone-Aloe Vera (CORTIZONE-10 PLUS EX) Apply 1 application topically as directed. Use as needed    . levothyroxine (SYNTHROID, LEVOTHROID) 112 MCG tablet Take 112 mcg by mouth daily before breakfast.    . Multiple Vitamins-Minerals (MULTIVITAMIN WITH MINERALS) tablet Take 1 tablet by mouth daily.    . niacin 500 MG tablet Take 500 mg by mouth daily with breakfast.      . omeprazole (PRILOSEC) 40 MG capsule Take 40 mg by mouth daily.    . simvastatin (ZOCOR) 20 MG tablet Take 1 tablet (20 mg total) by mouth at bedtime. 90 tablet 3  . trandolapril (MAVIK) 2 MG tablet Take 3 mg by mouth daily.     No current facility-administered medications for this visit.     Allergies:   Review of patient's allergies indicates no known allergies.   Social History:  The patient  reports that he quit smoking about 15 years ago. His smoking use included Cigarettes. He started smoking about 58 years ago. He has  a 43.00 pack-year smoking history. He has never used smokeless tobacco. He reports that he does not drink alcohol or use drugs.   Family History:  The patient's family history includes Diabetes type I in his sister; Lung cancer in his brother, brother, and brother.  ROS:  Please see the history of present illness.  All other systems are reviewed and otherwise negative.   PHYSICAL EXAM:  VS:  BP 108/60   Pulse 62   Ht 5\' 9"  (1.753 m)   Wt 140 lb (63.5 kg)   BMI 20.67 kg/m  BMI: Body mass index is 20.67 kg/m. Well nourished, well developed, in no acute distress  HEENT: normocephalic, atraumatic  Neck: no JVD, carotid bruits or masses Cardiac:  RRR; no significant murmurs, no rubs, or gallops Lungs:  clear  to auscultation bilaterally, no wheezing, rhonchi or rales  Abd: soft, nontender MS: no deformity or atrophy Ext: no edema  Skin: warm and dry, no rash Neuro:  No gross deficits appreciated Psych: euthymic mood, full affect  ICD site is stable, no tethering or discomfort, well healed, there is a small amount of fluid accumulation/edema at the device , is soft, nontender, no erythema, not increased heat to the tissues/skin.   EKG:  05/03/16 is AV paced ICD interrogation today: functioning as programmed, no events/observations since his check Monday, at that visit noted to have 2 very brief NSVT episodes, did not correlate with his symptoms, kasted one and two seconds duration only  12/10/15: [facility], Dr. Martinique Conclusion   Prox RCA to Mid RCA lesion, 20% stenosed.  Prox Cx lesion, 30% stenosed.  The left ventricular systolic function is normal.   1. Mild nonobstructive CAD 2. Normal LV function 3. Mild pulmonary HTN with normal LV filling pressures.  Plan: findings suggest his dyspnea is more pulmonary related. Continue medical therapy.   11/10/15: Echocardiogram Study Conclusions - Left ventricle: The cavity size was mildly dilated. There was   mild focal basal  hypertrophy of the posterior wall. Systolic   function was normal. The estimated ejection fraction was in the   range of 55% to 60%. Wall motion was normal; there were no   regional wall motion abnormalities. Doppler parameters are   consistent with abnormal left ventricular relaxation (grade 1   diastolic dysfunction). - Aortic valve: There was mild to moderate regurgitation. - Right ventricle: The cavity size was normal. Wall thickness was   normal. Systolic function was normal. - Tricuspid valve: There was no regurgitation. - Pulmonic valve: There was moderate regurgitation. - Pulmonary arteries: PA peak pressure: 22 mm Hg (S). - Inferior vena cava: The vessel was normal in size. The   respirophasic diameter changes were in the normal range (= 50%),   consistent with normal central venous pressure.   Recent Labs: 04/28/2016: Hemoglobin 13.4; Platelets 183 05/16/2016: BUN 24; Creat 1.09; Potassium 4.1; Sodium 140  No results found for requested labs within last 8760 hours.   Estimated Creatinine Clearance: 53.4 mL/min (by C-G formula based on SCr of 1.09 mg/dL).   Wt Readings from Last 3 Encounters:  05/19/16 140 lb (63.5 kg)  05/03/16 136 lb (61.7 kg)  04/28/16 135 lb (61.2 kg)     Other studies reviewed: Additional studies/records reviewed today include: summarized above  ASSESSMENT AND PLAN:  1. CAD/ICM with improved EF     Atypical sounding chest c/o    Stable non-obstructive CAD by cath Feb 2017       2. CRT-ICD >>> now with CRT-P at his gen change     site is stable, minimal fluid accumulation, no hematoma, does not appear infected     q 46mo remotes  3. Weakness, hypotension at his wound check visit earlier this week     Has felt well since, 106/60 today     Given normalization of his LVEF, will decrease his lasix to BID from TID     Weight is stable, her reports his avg home weight is usually 140-145            Disposition: he was planned for routine visit  with Dr. Lovena Le, will keep that visit, he is instructedto monitor for any edema and weight gain, keep an eye on his BP.      Current medicines are reviewed at length with the  patient today.  The patient did not have any concerns regarding medicines.  Haywood Lasso, PA-C 05/19/2016 5:38 PM     [place] [address] [phone] (office)  [phone] (fax)

## 2016-05-19 NOTE — Patient Instructions (Signed)
Medication Instructions:   START TAKING LASIX TWICE A DAY   If you need a refill on your cardiac medications before your next appointment, please call your pharmacy.  Labwork:  NONE ORDER TODAY    Testing/Procedures: NONE ORDER TODAY    Follow-Up: DR Lovena Le IN 2 MONTHS    Any Other Special Instructions Will Be Listed Below (If Applicable).

## 2016-05-20 ENCOUNTER — Encounter: Payer: Self-pay | Admitting: Internal Medicine

## 2016-05-23 DIAGNOSIS — I10 Essential (primary) hypertension: Secondary | ICD-10-CM | POA: Diagnosis not present

## 2016-05-23 DIAGNOSIS — M109 Gout, unspecified: Secondary | ICD-10-CM | POA: Diagnosis not present

## 2016-05-23 DIAGNOSIS — E039 Hypothyroidism, unspecified: Secondary | ICD-10-CM | POA: Diagnosis not present

## 2016-05-23 DIAGNOSIS — J449 Chronic obstructive pulmonary disease, unspecified: Secondary | ICD-10-CM | POA: Diagnosis not present

## 2016-05-23 DIAGNOSIS — Z299 Encounter for prophylactic measures, unspecified: Secondary | ICD-10-CM | POA: Diagnosis not present

## 2016-06-08 NOTE — Progress Notes (Signed)
Order(s) created erroneously. Erroneous order ID: [id number]  Order moved by: Milas Hock  Order move date/time: 06/08/2016 4:19 PM  Source Patient: [id number]  Source Contact: 05/16/2016  Destination Patient: [id number]  Destination Contact: 01/01/2013

## 2016-06-23 ENCOUNTER — Ambulatory Visit: Payer: Medicare Other | Admitting: Pulmonary Disease

## 2016-06-24 ENCOUNTER — Telehealth: Payer: Self-pay | Admitting: Internal Medicine

## 2016-06-24 NOTE — Telephone Encounter (Signed)
New message       Wife states on Wednesday, pt states that he had heart flutters and did not feel good.  He feels ok now.  Should he send in a remote check?

## 2016-06-24 NOTE — Telephone Encounter (Signed)
Returned call and instructed pt and wife to send a manual transmission. Upon reviewing transmission I informed pt and wife that there were no episodes that correlated to the time of his symptoms on 06/22/2016. Wife was appreciative and will call with any additional concerns.

## 2016-07-04 ENCOUNTER — Ambulatory Visit (INDEPENDENT_AMBULATORY_CARE_PROVIDER_SITE_OTHER): Payer: Medicare Other | Admitting: Pulmonary Disease

## 2016-07-04 ENCOUNTER — Encounter (INDEPENDENT_AMBULATORY_CARE_PROVIDER_SITE_OTHER): Payer: Self-pay

## 2016-07-04 ENCOUNTER — Encounter: Payer: Self-pay | Admitting: Pulmonary Disease

## 2016-07-04 VITALS — BP 90/60 | HR 108 | Temp 98.2°F | Ht 69.0 in | Wt 139.1 lb

## 2016-07-04 DIAGNOSIS — I2510 Atherosclerotic heart disease of native coronary artery without angina pectoris: Secondary | ICD-10-CM

## 2016-07-04 DIAGNOSIS — I5022 Chronic systolic (congestive) heart failure: Secondary | ICD-10-CM

## 2016-07-04 DIAGNOSIS — I272 Other secondary pulmonary hypertension: Secondary | ICD-10-CM | POA: Diagnosis not present

## 2016-07-04 DIAGNOSIS — J432 Centrilobular emphysema: Secondary | ICD-10-CM

## 2016-07-04 DIAGNOSIS — I255 Ischemic cardiomyopathy: Secondary | ICD-10-CM

## 2016-07-04 MED ORDER — ACLIDINIUM BROMIDE 400 MCG/ACT IN AEPB: 1.0000 | INHALATION_SPRAY | Freq: Every day | RESPIRATORY_TRACT | 6 refills | Status: DC

## 2016-07-04 NOTE — Progress Notes (Signed)
Subjective:    Patient ID: [name], male    DOB: October 18, 1940, 75 y.o.   MRN: [id number]  HPI   OLDER DATA in Epic>>   CTChest 12/29/10 showed borderline cardiomeg, pacer device on left, no adenopathy, extensive centrilobular emphysema, atx or scarring at left base, NAD...   Last CXR 02/08/11 showed cardiomeg, AICD/pacer on left, hyperinflated lungs, clear, NAD...  ~  December 14, 2015:  Initial pulmonary evaluation by SN>  His PCP is DrVyas in [place], [place] and his Cardiologist is Medical sales representative...    75 y/o WM referred for pulmonary evaluation due to dyspnea>  [name] is an ex-smoker having started in his teens, smoked for 40 yrs up to 1ppd and says he quit in the early 2000's when he had an MI;  He denies hx lung problems as a young man- no hx asthma, bronchitis, pneumonia, etc; he has had a mild cough, not much sput, no hemoptysis; he developed SOB/DOE over the last 10 yrs and sl progressive by his history; we do not have records from his PCP but he has been on BREO but only using it prn w/ minimal benefit=> he requested a pulmonary evaluation to see if we could help w/ his dyspnea... He has mult somatic complaints- sinus problems, can't breathe thru is nose well, etc; he gets choked occas when eating & his GI is in [place], we do not have records...     He was Adm 12/10/15 for CATH> known CAD w/ remote PCI to the RCA, ischemic cardiomyopathy, CHF, hx of VT w/ BiV-ICD in place; Cath showed patent stent in distal RCA, & min CAD w/ 20% midRCA and 30%proxCIRC, norm LVF (EF=55-65% & no wall motion abnormalities), mild pulmHTN (PAsys=44) w/ norm filling pressures... Since his increased dyspnea was not related to cardiac issue he requested pulmonary evaluation...  Smoking Hx>  40 pack-yr hx and quit in early 2000s...  Pulmonary Hx>  He had a remote PE (details unknown) & was on Coumadin in the past; not prev told about COPD but was started on Breo prn last yr...  Medical Hx>  Hx CAD, AI,  cardiomyopathy w/ prev LV dysfunction, hx VTach w/ ICD placed, HL, hx thyroid cancer w/ surg~2000,   Family Hx>  3 brothers have all had lung cancer!  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  Current Meds> Breo (only using prn), ASA81, Coreg25Bid, Mavik3mg /d, Lasix20-3/d, Inspra25/d, Simva20, Niacin500, Synthroid112, Prilosec40 EXAM shows Afeb, VSS, O2sat=96% on RA at rest;  HEENT- neg, mallapmpati1;  Chest- decr BS bilat w/o w/r/r;  Heart- RR w/o m/r/g heard;  Abd- soft, nontender, neg;  Ext- neg w/o c/c/e;  Neuro- intact...   CXR 12/13/14 showed norm heart size, tortuous thor Ao, AICD device on left, 1.2cm nodular density in LLL (not seen on last film 2012), otherw clear lungs...   Spirometry 12/13/14>  FVC=1.74 (41%), FEV1=0.86 (26%), %1sec=49%, mid-flows reduced at 12% predicted;  This is c/w severe obstructive ventilatory defect & GOLD Stage4 COPD...  Ambulatory Oximetry 12/13/14>  O2sat=93% on RA at rest w/ pulse=61;  He ambulated 2 Laps & stopped w/ dyspnea & nadir O2sat=88% w/ pulse=96/min...  LAB here 12/13/25>  Alpha-1-Antitrypsin level = 171 (83-199), and Phenotype= MM  CT Chest done 12/22/15> atherosclerotic vasc dis, AICD noted, prior thyroidectomy, severe emphysema & atx in lingula but NO LLL nodule seen (?prev artifact?), patchy interstitial & airsp opacities in RML & RLL w/ airway thickening & subsolid nodularity,  no adenopathy, end-plate compression T3, 70% compression T7 => needs BMD IMP >>     Severe COPD/ Emphysema w/ GOLD Stage 4 disease and severe dyspnea>      Ex-smoker, quit in 2000 w/ 40 pack-yr history    Mild pulmonary hypertension> PAsys=86mmHg on cath 11/2015    ? RUL pulm nodule on CXR, CT Chest w/o lesion seen    Hx pulmonary embolism> remote history, details are unknown to [place]...    CARDIAC issues>  CAD w/ prev PCI to RCA, ischemic cardiomyopathy, CHF, AI, Hx VT w/ BiV-ICD in place; followed by DrTaylor; pt states he had Rheumatic  fever as a child...    MEDICAL issues>  Pt w/ mult somatic complaints, HBP, HL, Hx thyroid ca & surg, Hx prostate cancer & surg, Compression fx T7 & T3; his PCP is DrVyas in [place]. PLAN >>     [name] has severe COPD/ emphysema w/ GOLD Stage 4 dis & FEV1=0.86 (26% predicted);  He is not on any regular breathing meds & we will start BREO once daily everyday, and INCRUSE once daily;  He needs exercise/ pulm rehab and we will pursue this in follow up;  He also has mild pulmHTN on his cath 11/2015 and he desaturates to 88% w/ ambulation=> therefore he needs HomeO2 portable system but he declines to start this now & we will have to ease him into the idea of using O2 and the benefits to his system;  He also c/o some dysphagia/ choking & I'm concerned about poss aspiration => we will arrange for a Barium Swallow & discussed te need to take the PPI 90min before dinner, NPO after dinner, Elev HOB 6", and f/u w/ his GI... we plan ROV in 6-8wks w/ FullPFTs to check LV & DLCO...  ~  February 04, 2016:  51mo ROV w/ SN>  60 y/o man w/ GOLD Stage 4 COPD (FEV1=0.86, 26% pred), ex-smoker quit in 2000, w/ SOB/DOE and mult somatic complaints; he has mild pulmHTN on Cath 11/2015 w/ PAsys=44 and he desaturated to 88% w/ ambulation (he declined to start HomeO2); CT Chest showed severe emphysema w/ some atx & scarring; he has known cardiac dis w/ prev PCI to the RCA, ischemic cardiomyopathy, CHF, AI, Hx VT w/ BiV-ICD in place; we were concerned about poss aspiration w/ c/o dysphagia/ choking & requested a Ba swallow but he cancelled the test; he is on BREO & INCRUSE, along w/ Omep40 & an antireflux regimen; we discussed the need for an exercise program & strongly encouraged Pulm Rehab program...  He states he could not tolerate the Incruse, so we had called in [place] but he never picked this med up due to cost;  We had a long discussion about the avail meds and limited options for rx!    Severe COPD/ Emphysema w/ GOLD Stage 4  disease and severe dyspnea>  On Breo alone, states "intol" to Incruse, never picked up the Spiriva; we discussed need for an anticholinergic & he will try the Spiriva Respimat- 2sp/d...    Ex-smoker, quit in 2000 w/ 40 pack-yr history    Mild pulmonary hypertension> PAsys=38mmHg on cath 11/2015, hehas declined to start Home O2 at this time & we will follow...    ? RUL pulm nodule on CXR, CT Chest w/o lesion seen    Hx pulmonary embolism> remote history, details are unknown to [place]...    CARDIAC issues>  CAD w/ prev PCI to RCA, ischemic cardiomyopathy, CHF, AI, Hx VT  w/ BiV-ICD in place; followed by DrTaylor; pt states he had Rheumatic fever as a child...    MEDICAL issues>  Pt w/ mult somatic complaints, HBP, HL, Hx thyroid ca & surg, Hx prostate cancer & surg, Compression fx T7 & T3; his PCP is DrVyas in [place]. EXAM shows Afeb, VSS, O2sat=93% on RA at rest;  HEENT- neg, mallapmpati1;  Chest- decr BS bilat w/o w/r/r;  Heart- RR w/o m/r/g heard;  Abd- soft, nontender, neg;  Ext- neg w/o c/c/e;  Neuro- intact, anxious...   FullPFT 02/04/16>  FVC=2.99 (72%), FEV1=1.05 (35%), %1sec=35, mid-flows reduced at 11% predicted; post bronchodil there was a 19% incr in FEV1; TLC=7.52 (109%), RV=4.48 (181), RV/TLC=60%;  DLCO=27% predicted... This is c/w a severe obstructive ventilatory defect w/ air trapping, and a marked decr in DLCO c/w emphysema but there was also a signif reversible component post bronchodil... IMP/PLAN>>  I explained how he needs the ICS/LABA and Anticholinergic w/ his emphysema, + ProventilHFA rescue inhaler prn; this time he aslo needs home O2 but declines at present and PulmRehab (he will consider this)...   ~  July 04, 2016:  70mo ROV w/ SN>  Ezel says he's breathing better & attributes the improvement to "short breathing thru my mouth"... 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     Severe COPD/ Emphysema w/ GOLD Stage 4 disease and severe dyspnea>  On Breo alone, states "intol" to Incruse, never picked up the Spiriva; we discussed need for an anticholinergic & he will try the Spiriva Respimat- 2sp/d (never got it- too $$);  Asked to try [place] one inhalation daily (sample provided)...    Ex-smoker, quit in 2000 w/ 40+ pack-yr history...    Mild pulmonary hypertension>  PAsys=42mmHg on cath 11/2015, prev 2DEcho 10/2015 est PAsys=42mmHg; he has declined to start Home O2 at this time & we will follow...    ? RUL pulm nodule on CXR, CT Chest w/o lesion seen    Hx pulmonary embolism> remote history, details are unknown to [place]... EXAM shows Afeb, VSS, O2sat=97% on RA at rest;  HEENT- neg, mallapmpati1;  Chest- decr BS bilat w/o w/r/r;  Heart- RR w/o m/r/g heard;  Abd- soft, nontender, neg;  Ext- neg w/o c/c/e;  Neuro- intact, anxious...   Ambulatory oximetry9/18/17>  O2sat=93% on RA at rest w/ pulse=108/min;  He walked 3 laps (185'@) in the office w/ lowest O2sat=87% w/ pulse=96/min... Rec to start O2 w/ exercise BUT HE REFUSES OXYGEN AT THIS TIME.  IMP/PLAN>>  In addition to the above prob list- [name] has mild exercise induced hypoxemia w/ desat to 87% but refuses to start O2 w/ exercise (he is very stoic 7 points to his excellent exercise tolerance;  I have again asked him to start a LAMA med & wrote for Caprice Renshaw one inhalation daily;  Continue Breo, cardiac meds, etc...     Past Medical History:  Diagnosis Date  . AICD (automatic cardioverter/defibrillator)  present     . Aortic insufficiency     moderate aortic ins moderate/asymptomatic/normal LV cavity size.   . Carotid artery disease ([place])     less than 50% stenosis bilaterally  . Drug-induced gynecomastia     secondary to spironolactone  . Dyslipidemia   . ICD (implantable cardiac defibrillator), biventricular, in situ     Medtronic model  [id number]  . Ischemic cardiomyopathy     status post non-ST elevation microinfarction stent to the right coronary artery is a   . LV dysfunction     NYHA class I/prior ejection fraction 20-25% and an ejection fraction 7 2009 50-55%, ejection fraction 60% bedside echocardiogram July 2012   . PONV (postoperative nausea and vomiting)   . Pulmonary embolism ([facility])    Prior history of pulmonary embolism off Coumadin.  . Ventricular tachycardia ([place])    Inducible ventricular polymorphic tachycardia status post ICD followed by upgrade with CRT-D 2007, battery change at 01/31/2011    Past Surgical History:  Procedure Laterality Date  . CARDIAC CATHETERIZATION    . CARDIAC CATHETERIZATION N/A 12/10/2015   Procedure: Right/Left Heart Cath and Coronary Angiography;  Surgeon: Peter M Martinique, MD;  Location: [facility];  Service: Cardiovascular;  Laterality: N/A;  . CARDIAC DEFIBRILLATOR PLACEMENT     medtronic, remote - yes  . CATARACT EXTRACTION W/PHACO Left 08/25/2014   Procedure: CATARACT EXTRACTION PHACO AND INTRAOCULAR LENS PLACEMENT (IOC);  Surgeon: Tonny Branch, MD;  Location: [facility];  Service: Ophthalmology;  Laterality: Left;  CDE 5.79  . CATARACT EXTRACTION W/PHACO Right 09/18/2014   Procedure: CATARACT EXTRACTION PHACO AND INTRAOCULAR LENS PLACEMENT RIGHT EYE;  Surgeon: Tonny Branch, MD;  Location: [facility];  Service: Ophthalmology;  Laterality: Right;  CDE:3.35  . CORONARY STENT PLACEMENT    . EP IMPLANTABLE DEVICE N/A 05/03/2016   Procedure: BIV PPM Generator Changeout;  Surgeon: Evans Lance, MD;  Location: [facility];  Service: Cardiovascular;   Laterality: N/A;  . INGUINAL HERNIA REPAIR    . left breast biopsy    . RETROPUBIC PROSTATECTOMY    . THORACOTOMY     left  . THYROIDECTOMY      Outpatient Encounter Prescriptions as of 07/04/2016  Medication Sig  . aspirin 81 MG tablet Take 81 mg by mouth daily.   . calcium-vitamin D (OSCAL WITH D) 500-200 MG-UNIT tablet Take 1 tablet by mouth daily.  . carvedilol (COREG) 25 MG tablet Take 1 tablet (25 mg total) by mouth 2 (two) times daily with a meal.  . eplerenone (INSPRA) 25 MG tablet Take 1 tablet (25 mg total) by mouth daily.    ALBUTEROL-HFA rescue inhaler  Inhale 1-2 puffs every 6H as needed  . fluticasone furoate-vilanterol (BREO ELLIPTA) 100-25 MCG/INH AEPB Inhale 1 puff into the lungs daily.  . furosemide (LASIX) 20 MG tablet Take 3 tablets (60 mg total) by mouth daily. (Patient taking differently: No sig reported)  . Hydrocortisone-Aloe Vera (CORTIZONE-10 PLUS EX) Apply 1 application topically as directed. Use as needed  . levothyroxine (SYNTHROID, LEVOTHROID) 112 MCG tablet Take 112 mcg by mouth daily before breakfast.  . Multiple Vitamins-Minerals (MULTIVITAMIN WITH MINERALS) tablet Take 1 tablet by mouth daily.  . niacin 500 MG tablet Take 500 mg by mouth daily with breakfast.    . omeprazole (PRILOSEC) 40 MG capsule Take 40 mg by mouth daily.  . simvastatin (ZOCOR) 20 MG tablet Take 1 tablet (20 mg total) by mouth at bedtime.  . trandolapril ([place])  2 MG tablet Take 3 mg by mouth daily.    No Known Allergies   Immunization History  Administered Date(s) Administered  . Influenza, High Dose Seasonal PF 10/01/2015  . Pneumococcal Polysaccharide-23 10/17/2013    Family History  Problem Relation Age of Onset  . Diabetes type I Sister     daughter   . Lung cancer Brother     several types of cancer  . Lung cancer Brother     several types of cancer  . Lung cancer Brother   . Clotting disorder Neg Hx     also no repiratory disease   . Prostate cancer Neg Hx       Social History   Social History  . Marital status: Married    Spouse name: N/A  . Number of children: 2  . Years of education: N/A   Occupational History  . plumber - self employed Retired   Social History Main Topics  . Smoking status: Former Smoker    Packs/day: 1.00    Years: 43.00    Types: Cigarettes    Start date: 10/17/1957    Quit date: 12/13/2000  . Smokeless tobacco: Never Used     Comment: started at age 10  . Alcohol use No  . Drug use: No  . Sexual activity: Not on file   Other Topics Concern  . Not on file   Social History Narrative   Self employed Development worker, community; 2 daughters.     Current Medications, Allergies, Past Medical History, Past Surgical History, Family History, and Social History were reviewed in [employer] record.   Review of Systems  Constitutional: Negative for fever and unexpected weight change.  HENT: Positive for congestion, sneezing and trouble swallowing. Negative for dental problem, ear pain, nosebleeds, postnasal drip, rhinorrhea, sinus pressure and sore throat.   Eyes: Negative for redness and itching.  Respiratory: Positive for shortness of breath. Negative for cough, chest tightness and wheezing.   Cardiovascular: Negative for palpitations and leg swelling.  Gastrointestinal: Negative for nausea and vomiting.  Genitourinary: Negative for dysuria.  Musculoskeletal: Positive for joint swelling.  Skin: Positive for rash ( itching).  Neurological: Negative for headaches.  Hematological: Does not bruise/bleed easily.  Psychiatric/Behavioral: Negative for dysphoric mood. The patient is not nervous/anxious.       Objective:   Physical Exam   Vital Signs:  Reviewed...   General:  WD, WN, 75 y/o WM in NAD; alert & oriented; pleasant & cooperative... HEENT:  [place]/AT; Conjunctiva- pink, Sclera- nonicteric, EOM-wnl, PERRLA, EACs-clear, TMs-wnl; NOSE-clear; THROAT-clear & wnl.  Neck:  Supple w/ fair ROM; no JVD; normal  carotid impulses w/o bruits; no thyromegaly or nodules palpated; no lymphadenopathy.  Chest:  Decr BS bilat, clear to P & A; without wheezes, rales, or rhonchi heard. Heart:  Regular Rhythm; AICD on left; Gr 1/6 murmur, no rubs or gallops detected. Abdomen:  Soft & nontender- no guarding or rebound; normal bowel sounds; no organomegaly or masses palpated. Ext:  DecrROM; without deformities +arthritic changes; no varicose veins, venous insuffic, or edema;  Pulses intact w/o bruits. Neuro:  No focal neuro deficits; sensory testing normal; gait normal & balance OK; he is anxious. Derm:  No lesions noted; no rash etc. Lymph:  No cervical, supraclavicular, axillary, or inguinal adenopathy palpated.     Assessment & Plan:    IMP >>     Severe COPD/ Emphysema w/ GOLD Stage 4 disease and severe dyspnea>      Ex-smoker,  quit in 2000 w/ 40 pack-yr history    Mild pulmonary hypertension> PAsys=52mmHg on cath 11/2015    ? RUL pulm nodule on CXR, CT Chest w/o lesion seen    Hx pulmonary embolism> remote history, details are unknown to [place]...    CARDIAC issues>  CAD w/ prev PCI to RCA, ischemic cardiomyopathy, CHF, AI, Hx VT w/ BiV-ICD in place; followed by DrTaylor; pt states he had Rheumatic fever as a child...    MEDICAL issues>  Pt w/ mult somatic complaints, HBP, HL, Hx thyroid ca & surg, Hx prostate cancer & surg, Compression fx T7 & T3; his PCP is DrVyas in [place].  PLAN >>  12/14/15>   [name] has severe COPD/ emphysema w/ GOLD Stage 4 dis & FEV1=0.86 (26% predicted);  He is not on any regular breathing meds & we will start BREO once daily everyday, and INCRUSE once daily;  He needs exercise/ pulm rehab and we will pursue this in follow up;  He also has mild pulmHTN on his cath 11/2015 and he desaturates to 88% w/ ambulation=> therefore he needs HomeO2 portable system but he declines to start this now & we will have to ease him into the idea of using O2 and the benefits to his system;  He also c/o some  dysphagia/ choking & I'm concerned about poss aspiration => we will arrange for a Barium Swallow & discussed te need to take the PPI 27min before dinner, NPO after dinner, Elev HOB 6", and f/u w/ his GI... 4/20>   I explained how he needs the ICS/LABA and Anticholinergic w/ his emphysema, + ProventilHFA rescue inhaler prn;this time); he aslo needs home O2 but declines at present and PulmRehab (he will consider this)... 07/04/16>   In addition to the above prob list- [name] has mild exercise induced hypoxemia w/ desat to 87% but refuses to start O2 w/ exercise (he is very stoic 7 points to his excellent exercise tolerance;  I have again asked him to start a LAMA med & wrote for Caprice Renshaw one inhalation daily;  Continue Breo, cardiac meds, etc.   Patient's Medications  New Prescriptions   ACLIDINIUM BROMIDE (TUDORZA PRESSAIR) 400 MCG/ACT AEPB    Inhale 1 puff into the lungs daily.  Previous Medications   ASPIRIN 81 MG TABLET    Take 81 mg by mouth daily.    CALCIUM-VITAMIN D (OSCAL WITH D) 500-200 MG-UNIT TABLET    Take 1 tablet by mouth daily.   CARVEDILOL (COREG) 25 MG TABLET    Take 1 tablet (25 mg total) by mouth 2 (two) times daily with a meal.   EPLERENONE (INSPRA) 25 MG TABLET    Take 1 tablet (25 mg total) by mouth daily.   FLUTICASONE FUROATE-VILANTEROL (BREO ELLIPTA) 100-25 MCG/INH AEPB    Inhale 1 puff into the lungs daily.   FUROSEMIDE (LASIX) 20 MG TABLET    Take 3 tablets (60 mg total) by mouth daily.   HYDROCORTISONE-ALOE VERA (CORTIZONE-10 PLUS EX)    Apply 1 application topically as directed. Use as needed   LEVOTHYROXINE (SYNTHROID, LEVOTHROID) 112 MCG TABLET    Take 112 mcg by mouth daily before breakfast.   MULTIPLE VITAMINS-MINERALS (MULTIVITAMIN WITH MINERALS) TABLET    Take 1 tablet by mouth daily.   NIACIN 500 MG TABLET    Take 500 mg by mouth daily with breakfast.     OMEPRAZOLE (PRILOSEC) 40 MG CAPSULE    Take 40 mg by mouth daily.   SIMVASTATIN (ZOCOR) 20  MG TABLET    Take  1 tablet (20 mg total) by mouth at bedtime.   TRANDOLAPRIL (MAVIK) 2 MG TABLET    Take 3 mg by mouth daily.  Modified Medications   No medications on file  Discontinued Medications   No medications on file

## 2016-07-04 NOTE — Patient Instructions (Signed)
Today we updated your med list in our EPIC system...    Continue your current medications the same...  I believe it would still be beneficial to your breathing to be on a combination of inhalers--    Continue the BREO 100- one inhalation daily...    Try to add-in the new TUDORZA - start w/ one inhalation daily        (do one in the AM & the other in the PM)  Continue your rescue inhaler as needed...  Stay as active as possible- walking, treadmill, mowing, & your plumbing work, etc... but don't push too hard!  Be sure to get your Flu shot from your PCP this fall...  Call for any questions...  Let's plan a follow up visit in 73mo, sooner if needed for problems.Marland KitchenMarland Kitchen

## 2016-07-06 ENCOUNTER — Encounter: Payer: Self-pay | Admitting: Internal Medicine

## 2016-07-20 ENCOUNTER — Encounter: Payer: Self-pay | Admitting: Internal Medicine

## 2016-07-20 ENCOUNTER — Ambulatory Visit (INDEPENDENT_AMBULATORY_CARE_PROVIDER_SITE_OTHER): Payer: Medicare Other | Admitting: Internal Medicine

## 2016-07-20 VITALS — BP 92/60 | HR 88 | Ht 69.0 in | Wt 140.0 lb

## 2016-07-20 DIAGNOSIS — I255 Ischemic cardiomyopathy: Secondary | ICD-10-CM

## 2016-07-20 DIAGNOSIS — I5022 Chronic systolic (congestive) heart failure: Secondary | ICD-10-CM

## 2016-07-20 DIAGNOSIS — Z950 Presence of cardiac pacemaker: Secondary | ICD-10-CM | POA: Diagnosis not present

## 2016-07-20 LAB — CUP PACEART INCLINIC DEVICE CHECK
Battery Voltage: 3.01 V
Brady Statistic AP VP Percent: 50.89 %
Brady Statistic RA Percent Paced: 50.93 %
Date Time Interrogation Session: 20171004122550
Implantable Lead Implant Date: 20050810
Implantable Lead Location: 753858
Implantable Lead Location: 753860
Implantable Lead Model: 4194
Implantable Lead Model: 5076
Lead Channel Impedance Value: 247 Ohm
Lead Channel Impedance Value: 475 Ohm
Lead Channel Impedance Value: 589 Ohm
Lead Channel Impedance Value: 665 Ohm
Lead Channel Pacing Threshold Amplitude: 0.5 V
Lead Channel Pacing Threshold Amplitude: 1.75 V
Lead Channel Pacing Threshold Pulse Width: 0.4 ms
Lead Channel Pacing Threshold Pulse Width: 0.6 ms
Lead Channel Pacing Threshold Pulse Width: 1 ms
Lead Channel Sensing Intrinsic Amplitude: 5.5 mV
Lead Channel Setting Pacing Amplitude: 1.5 V
Lead Channel Setting Pacing Pulse Width: 0.6 ms
Lead Channel Setting Sensing Sensitivity: 2.8 mV
MDC IDC LEAD IMPLANT DT: 20050810
MDC IDC LEAD IMPLANT DT: 20071015
MDC IDC LEAD LOCATION: 753859
MDC IDC LEAD MODEL: 6949
MDC IDC MSMT BATTERY REMAINING LONGEVITY: 66 mo
MDC IDC MSMT LEADCHNL LV IMPEDANCE VALUE: 399 Ohm
MDC IDC MSMT LEADCHNL LV IMPEDANCE VALUE: 513 Ohm
MDC IDC MSMT LEADCHNL LV PACING THRESHOLD AMPLITUDE: 2.5 V
MDC IDC MSMT LEADCHNL RA IMPEDANCE VALUE: 532 Ohm
MDC IDC MSMT LEADCHNL RV IMPEDANCE VALUE: 475 Ohm
MDC IDC MSMT LEADCHNL RV IMPEDANCE VALUE: 589 Ohm
MDC IDC MSMT LEADCHNL RV PACING THRESHOLD AMPLITUDE: 1.5 V
MDC IDC MSMT LEADCHNL RV PACING THRESHOLD PULSEWIDTH: 0.4 ms
MDC IDC MSMT LEADCHNL RV SENSING INTR AMPL: 10.5 mV
MDC IDC SET LEADCHNL LV PACING AMPLITUDE: 3.5 V
MDC IDC SET LEADCHNL LV PACING PULSEWIDTH: 1 ms
MDC IDC SET LEADCHNL RV PACING AMPLITUDE: 2.5 V
MDC IDC STAT BRADY AP VS PERCENT: 0.04 %
MDC IDC STAT BRADY AS VP PERCENT: 47.94 %
MDC IDC STAT BRADY AS VS PERCENT: 1.13 %
MDC IDC STAT BRADY RV PERCENT PACED: 98.82 %

## 2016-07-20 NOTE — Progress Notes (Signed)
HPI [name] returns today for followup. He is a pleasant 75 yo man with a h/o an ICM, chronic systolic CHF, s/p BiV ICD implant. He has had severe LV dysfunction since 2005 but did have improvement in 2009. He has severe COPD. He underwent ICD removal and insertion of a BiV PPM several months ago. In the interim, he has had periods were he felt poorly. He does not feel palpitations.    No Known Allergies   Current Outpatient Prescriptions  Medication Sig Dispense Refill  . Aclidinium Bromide (TUDORZA PRESSAIR) 400 MCG/ACT AEPB Inhale 1 puff into the lungs daily. 1 each 6  . aspirin 81 MG tablet Take 81 mg by mouth daily.     . calcium-vitamin D (OSCAL WITH D) 500-200 MG-UNIT tablet Take 1 tablet by mouth daily.    . carvedilol (COREG) 25 MG tablet Take 1 tablet (25 mg total) by mouth 2 (two) times daily with a meal. 180 tablet 3  . eplerenone (INSPRA) 25 MG tablet Take 1 tablet (25 mg total) by mouth daily. 90 tablet 3  . fluticasone furoate-vilanterol (BREO ELLIPTA) 100-25 MCG/INH AEPB Inhale 1 puff into the lungs daily. 60 each 11  . furosemide (LASIX) 20 MG tablet Take 3 tablets (60 mg total) by mouth daily. (Patient taking differently: No sig reported) 360 tablet 3  . Hydrocortisone-Aloe Vera (CORTIZONE-10 PLUS EX) Apply 1 application topically as directed. Use as needed    . levothyroxine (SYNTHROID, LEVOTHROID) 112 MCG tablet Take 112 mcg by mouth daily before breakfast.    . Multiple Vitamins-Minerals (MULTIVITAMIN WITH MINERALS) tablet Take 1 tablet by mouth daily.    . niacin 500 MG tablet Take 500 mg by mouth daily with breakfast.      . omeprazole (PRILOSEC) 40 MG capsule Take 40 mg by mouth daily.    . simvastatin (ZOCOR) 20 MG tablet Take 1 tablet (20 mg total) by mouth at bedtime. 90 tablet 3  . trandolapril (MAVIK) 2 MG tablet Take 3 mg by mouth daily.     No current facility-administered medications for this visit.      Past Medical History:  Diagnosis Date  . AICD  (automatic cardioverter/defibrillator) present   . Aortic insufficiency     moderate aortic ins moderate/asymptomatic/normal LV cavity size.   . Carotid artery disease ([place])     less than 50% stenosis bilaterally  . Drug-induced gynecomastia     secondary to spironolactone  . Dyslipidemia   . ICD (implantable cardiac defibrillator), biventricular, in situ     Medtronic model  [id number]  . Ischemic cardiomyopathy     status post non-ST elevation microinfarction stent to the right coronary artery is a   . LV dysfunction     NYHA class I/prior ejection fraction 20-25% and an ejection fraction 7 2009 50-55%, ejection fraction 60% bedside echocardiogram July 2012   . PONV (postoperative nausea and vomiting)   . Pulmonary embolism ([facility])    Prior history of pulmonary embolism off Coumadin.  . Ventricular tachycardia ([place])    Inducible ventricular polymorphic tachycardia status post ICD followed by upgrade with CRT-D 2007, battery change at 01/31/2011    ROS:   All systems reviewed and negative except as noted in the HPI.   Past Surgical History:  Procedure Laterality Date  . CARDIAC CATHETERIZATION    . CARDIAC CATHETERIZATION N/A 12/10/2015   Procedure: Right/Left Heart Cath and Coronary Angiography;  Surgeon: Peter M Martinique, MD;  Location: [facility];  Service:  Cardiovascular;  Laterality: N/A;  . CARDIAC DEFIBRILLATOR PLACEMENT     medtronic, remote - yes  . CATARACT EXTRACTION W/PHACO Left 08/25/2014   Procedure: CATARACT EXTRACTION PHACO AND INTRAOCULAR LENS PLACEMENT (IOC);  Surgeon: Tonny Branch, MD;  Location: [facility];  Service: Ophthalmology;  Laterality: Left;  CDE 5.79  . CATARACT EXTRACTION W/PHACO Right 09/18/2014   Procedure: CATARACT EXTRACTION PHACO AND INTRAOCULAR LENS PLACEMENT RIGHT EYE;  Surgeon: Tonny Branch, MD;  Location: [facility];  Service: Ophthalmology;  Laterality: Right;  CDE:3.35  . CORONARY STENT PLACEMENT    . EP IMPLANTABLE DEVICE N/A 05/03/2016   Procedure:  BIV PPM Generator Changeout;  Surgeon: Evans Lance, MD;  Location: [facility];  Service: Cardiovascular;  Laterality: N/A;  . INGUINAL HERNIA REPAIR    . left breast biopsy    . RETROPUBIC PROSTATECTOMY    . THORACOTOMY     left  . THYROIDECTOMY       Family History  Problem Relation Age of Onset  . Diabetes type I Sister     daughter   . Lung cancer Brother     several types of cancer  . Lung cancer Brother     several types of cancer  . Lung cancer Brother   . Clotting disorder Neg Hx     also no repiratory disease   . Prostate cancer Neg Hx      Social History   Social History  . Marital status: Married    Spouse name: N/A  . Number of children: 2  . Years of education: N/A   Occupational History  . plumber - self employed Retired   Social History Main Topics  . Smoking status: Former Smoker    Packs/day: 1.00    Years: 43.00    Types: Cigarettes    Start date: 10/17/1957    Quit date: 12/13/2000  . Smokeless tobacco: Never Used     Comment: started at age 70  . Alcohol use No  . Drug use: No  . Sexual activity: Not on file   Other Topics Concern  . Not on file   Social History Narrative   Self employed Development worker, community; 2 daughters.      BP 92/60   Pulse 88  P- 140 by me Physical Exam:  stable appearing 75 yo man, NAD HEENT: Unremarkable Neck:  6 cm JVD, no thyromegally Back:  No CVA tenderness Lungs:  Clear with no wheezes, well healed ICD incision. Reduced lung sounds. HEART:  Regular tachy rhythm, no murmurs, no rubs, no clicks Abd:  soft, positive bowel sounds, no organomegally, no rebound, no guarding Ext:  2 plus pulses, no edema, no cyanosis, no clubbing Skin:  No rashes no nodules Neuro:  CN II through XII intact, motor grossly intact  ECG - atrial tachycardia with variable AV conduction.  DEVICE  Normal device function.  See PaceArt for details.   Assess/Plan: 1. BiV PPM - his device is working normally. Will recheck in  several months. 2. Chronic systolic heart failure - his EF has nearly normalized. His symptoms have recently worsened which correlate with his SVT.  3. COPD - he will continue his bronchodilators. 4. SVT - he is conducting mostly today and is in atrial tachycardia. I have paced him back to NSR at 63/min. He immediately felt better. I have asked the patient to record his BP and HR daily.  Mikle Bosworth.D.

## 2016-07-20 NOTE — Patient Instructions (Addendum)
Medication Instructions:  Your physician recommends that you continue on your current medications as directed. Please refer to the Current Medication list given to you today.   Labwork: None Ordered   Testing/Procedures: None Ordered   Follow-Up: Your physician recommends that you schedule a follow-up appointment in: 6 weeks with Dr. Lovena Le.     Any Other Special Instructions Will Be Listed Below (If Applicable). 1) Record  Blood Pressure and heart rate daily   2) Margarita Grizzle will call you to follow-up on your fluid levels   If you need a refill on your cardiac medications before your next appointment, please call your pharmacy.

## 2016-07-21 ENCOUNTER — Telehealth: Payer: Self-pay | Admitting: Internal Medicine

## 2016-07-21 ENCOUNTER — Telehealth: Payer: Self-pay

## 2016-07-21 NOTE — Telephone Encounter (Signed)
New message   Pts dtr is calling to get an understanding about what is going on with her dad. She states that her parents came up for a visit and they do not have a clear understanding of what is going on. Please call.

## 2016-07-21 NOTE — Telephone Encounter (Signed)
Spoke with patient's daughter and answered her questions. She is a Marine scientist and is going to try and come to her Dad's next appointment in Nov.

## 2016-07-21 NOTE — Telephone Encounter (Signed)
Received referral from [facility], device RN/Dr Lovena Le.  ICM Intro letter given to patient at office visit.   Returned call to wife, Hassan Rowan [facility]) after receiving voice mail message.   She asked about the date for the 1st ICM transmission and advised will be 08/09/2016 and patient has office appointment on 09/02/2016 with Dr Lovena Le.  Reviewed fluid symptoms and encouraged to call should patient experience symptoms.  Patient weighs daily.

## 2016-07-26 ENCOUNTER — Ambulatory Visit (INDEPENDENT_AMBULATORY_CARE_PROVIDER_SITE_OTHER): Payer: Medicare Other

## 2016-07-26 ENCOUNTER — Telehealth: Payer: Self-pay

## 2016-07-26 DIAGNOSIS — I5022 Chronic systolic (congestive) heart failure: Secondary | ICD-10-CM

## 2016-07-26 DIAGNOSIS — Z950 Presence of cardiac pacemaker: Secondary | ICD-10-CM

## 2016-07-26 MED ORDER — CARVEDILOL 25 MG PO TABS: ORAL_TABLET | ORAL | 3 refills | Status: DC

## 2016-07-26 NOTE — Telephone Encounter (Addendum)
Received call from wife.  She stated patient has not been feeling well.  He feels weak and dizzy.  BP has been low 83/49.  Advised to send remote transmission for review.  See ICM note

## 2016-07-26 NOTE — Progress Notes (Signed)
Call to wife.  Advised reviewed with Chanetta Marshall, NP in the office. Provided recommendations to decrease Carvedilol (Coreg) 25 mg to 1/2 tablet bid.   Temporary decrease of Furosemide 20 mg to 1 tablet daily x 3 days and then resume prescribed dosage of 3 tablets (60 mg total) daily.  BMET and CBC within the next couple of days.  Patient referred to cardiologist Dr End.  She verbalized understanding of medication changes.  She stated she will have labs drawn either tomorrow or Thursday, 07/28/2016.  Requested she call back on 07/28/2016 to let me know how he is doing and if BP has increased.  She stated she would do so.

## 2016-07-26 NOTE — Progress Notes (Signed)
EPIC Encounter for ICM Monitoring  Patient Name: [name] is a 75 y.o. male Date: 07/26/2016 Primary Care Physican: Glenda Chroman, MD Primary Cardiologist: Lovena Le Electrophysiologist: Lovena Le Dry Weight:  unknown Bi-V Pacing:  84.1%       Received call from patient's wife.  She stated patient has not been feeling well.  He feels weak and dizzy.  BP has been low 83/49.  Wife stated she tried to get patient a regular cardiologist after patients last hospitalization and was told Dr Burt Knack would not be able to see him.  She knows that Dr Lovena Le is a specialist for his pacemaker.   Thoracic impedance normal   Recommendations: Reviewed with [name] Marshall, NP in the office. Recommendation to decrease Carvedilol (Coreg) 25 mg to 1/2 tablet bid.   Temporary decrease of Furosemide 20 mg to 1 tablet daily x 3 days and then resume prescribed dosage of 3 tablets (60 mg total) daily.  BMET and CBC within the next couple of days.  Patient referred to cardiologist Dr End and will advise wife of recommendation.    Follow-up plan: ICM clinic phone appointment on 08/09/2016.  Copy of ICM check sent to device physician.   ICM trend: 07/26/2016       Rosalene Billings, RN 07/26/2016 2:35 PM

## 2016-07-27 ENCOUNTER — Other Ambulatory Visit: Payer: Medicare Other

## 2016-07-28 ENCOUNTER — Other Ambulatory Visit: Payer: Medicare Other | Admitting: *Deleted

## 2016-07-28 DIAGNOSIS — I5022 Chronic systolic (congestive) heart failure: Secondary | ICD-10-CM | POA: Diagnosis not present

## 2016-07-28 LAB — CBC
HCT: 41.4 % (ref 38.5–50.0)
Hemoglobin: 13.8 g/dL (ref 13.2–17.1)
MCH: 29.9 pg (ref 27.0–33.0)
MCHC: 33.3 g/dL (ref 32.0–36.0)
MCV: 89.6 fL (ref 80.0–100.0)
MPV: 8.8 fL (ref 7.5–12.5)
PLATELETS: 174 10*3/uL (ref 140–400)
RBC: 4.62 MIL/uL (ref 4.20–5.80)
RDW: 13.5 % (ref 11.0–15.0)
WBC: 5.3 10*3/uL (ref 3.8–10.8)

## 2016-07-28 LAB — BASIC METABOLIC PANEL
BUN: 12 mg/dL (ref 7–25)
CALCIUM: 9 mg/dL (ref 8.6–10.3)
CO2: 31 mmol/L (ref 20–31)
CREATININE: 0.98 mg/dL (ref 0.70–1.18)
Chloride: 102 mmol/L (ref 98–110)
Glucose, Bld: 70 mg/dL (ref 65–99)
Potassium: 4.5 mmol/L (ref 3.5–5.3)
SODIUM: 141 mmol/L (ref 135–146)

## 2016-07-29 NOTE — Progress Notes (Signed)
Follow up call to patient's wife today.  She stated patient felt weak yesterday but is feeling better today.     10/12 BP:   90/60 p 117 and 102/69 p 125.  10/13 BP:  152/66 pulse 84 - only meds taken today have been furosemide and thyroid med.    Reviewed labs with wife and all within normal limits.  Patient has appointment with Dr Lovena Le 09/02/2016.   Dr Lovena Le instructed patient at last visit to take BP and HR x 6 weeks.  Patient and wife discussed high heart rate with Dr Lovena Le at 07/20/2016 visit.    Advised would provide Chanetta Marshall, NP for Dr Lovena Le update on how patient is feeling.  If any recommendations, will call her back.   Next ICM remote transmission scheduled for 10/03/2016 and office appointment with Dr Lovena Le 09/02/2016.

## 2016-08-01 DIAGNOSIS — I509 Heart failure, unspecified: Secondary | ICD-10-CM | POA: Diagnosis not present

## 2016-08-01 DIAGNOSIS — I252 Old myocardial infarction: Secondary | ICD-10-CM | POA: Diagnosis not present

## 2016-08-01 DIAGNOSIS — I471 Supraventricular tachycardia: Secondary | ICD-10-CM | POA: Diagnosis not present

## 2016-08-01 DIAGNOSIS — Z950 Presence of cardiac pacemaker: Secondary | ICD-10-CM | POA: Diagnosis not present

## 2016-08-01 DIAGNOSIS — Z299 Encounter for prophylactic measures, unspecified: Secondary | ICD-10-CM | POA: Diagnosis not present

## 2016-08-01 DIAGNOSIS — I110 Hypertensive heart disease with heart failure: Secondary | ICD-10-CM | POA: Diagnosis not present

## 2016-08-01 DIAGNOSIS — Z79899 Other long term (current) drug therapy: Secondary | ICD-10-CM | POA: Diagnosis not present

## 2016-08-01 DIAGNOSIS — M25561 Pain in right knee: Secondary | ICD-10-CM | POA: Diagnosis not present

## 2016-08-01 DIAGNOSIS — J449 Chronic obstructive pulmonary disease, unspecified: Secondary | ICD-10-CM | POA: Diagnosis not present

## 2016-08-01 DIAGNOSIS — Z7982 Long term (current) use of aspirin: Secondary | ICD-10-CM | POA: Diagnosis not present

## 2016-08-02 ENCOUNTER — Telehealth: Payer: Self-pay | Admitting: Internal Medicine

## 2016-08-02 NOTE — Telephone Encounter (Signed)
Spoke with pt wife, pt went to the er last night with fast heart rate. He reports he has had this before but was unable to get it to break. The wife reported they gave him some medicine to bring down the heart rate in the er. The medtronic rep checked his device while in the er. According to the device clinic the strips from last night will be discussed with dr taylor and they will follow up with the patient. mwessage forwarded to the device clinic.

## 2016-08-02 NOTE — Telephone Encounter (Signed)
New message      Pt was seen at [facility] ER yesterday.  His heart was racing---pulse 141.  Should he follow up in device clinic?

## 2016-08-03 ENCOUNTER — Encounter: Payer: Self-pay | Admitting: Internal Medicine

## 2016-08-03 ENCOUNTER — Ambulatory Visit (INDEPENDENT_AMBULATORY_CARE_PROVIDER_SITE_OTHER): Payer: Medicare Other | Admitting: Internal Medicine

## 2016-08-03 VITALS — BP 98/58 | HR 108 | Ht 69.0 in | Wt 144.5 lb

## 2016-08-03 DIAGNOSIS — Z79899 Other long term (current) drug therapy: Secondary | ICD-10-CM | POA: Diagnosis not present

## 2016-08-03 DIAGNOSIS — I5022 Chronic systolic (congestive) heart failure: Secondary | ICD-10-CM | POA: Diagnosis not present

## 2016-08-03 DIAGNOSIS — I255 Ischemic cardiomyopathy: Secondary | ICD-10-CM

## 2016-08-03 MED ORDER — FLECAINIDE ACETATE 50 MG PO TABS: 50.0000 mg | ORAL_TABLET | Freq: Two times a day (BID) | ORAL | 3 refills | Status: DC

## 2016-08-03 NOTE — Progress Notes (Signed)
HPI [name] returns today for an unscheduled followup. He is a pleasant 75 yo man with a h/o an ICM, chronic systolic CHF, s/p BiV ICD implant. He has had severe LV dysfunction since 2005 but did have improvement in 2009. He has severe COPD. He underwent ICD removal and insertion of a BiV PPM several months ago. In the interim, he has had periods were he felt poorly. He does not feel palpitations. The patient had a heart cath less than a year ago where he was found to have only mild non-obstructive disease and normal LV function with no segmental wall motion abnormalities.   When I saw him 2 weeks ago he was feeling bad and was in atrial tachycardia at 150/min. We paced him back to NSR. He was found today to have reverted back to his atrial tachycardia. He does not feel palpitations. He simply feels bad.    No Known Allergies   Current Outpatient Prescriptions  Medication Sig Dispense Refill  . aspirin 81 MG tablet Take 81 mg by mouth daily.     . calcium-vitamin D (OSCAL WITH D) 500-200 MG-UNIT tablet Take 1 tablet by mouth daily.    . carvedilol (COREG) 25 MG tablet Take .5 (12.5 mg total) by mouth 2 (two) times daily with a meal. 180 tablet 3  . eplerenone (INSPRA) 25 MG tablet Take 1 tablet (25 mg total) by mouth daily. 90 tablet 3  . fluticasone furoate-vilanterol (BREO ELLIPTA) 100-25 MCG/INH AEPB Inhale 1 puff into the lungs daily. 60 each 11  . furosemide (LASIX) 20 MG tablet Take 3 tablets (60 mg total) by mouth daily. (Patient taking differently: No sig reported) 360 tablet 3  . Hydrocortisone-Aloe Vera (CORTIZONE-10 PLUS EX) Apply 1 application topically as directed. Use as needed    . levothyroxine (SYNTHROID, LEVOTHROID) 112 MCG tablet Take 112 mcg by mouth daily before breakfast.    . Multiple Vitamins-Minerals (MULTIVITAMIN WITH MINERALS) tablet Take 1 tablet by mouth daily.    . niacin 500 MG tablet Take 500 mg by mouth daily with breakfast.      . omeprazole (PRILOSEC) 40 MG  capsule Take 40 mg by mouth daily.    . simvastatin (ZOCOR) 20 MG tablet Take 1 tablet (20 mg total) by mouth at bedtime. 90 tablet 3  . trandolapril (MAVIK) 2 MG tablet Take 3 mg by mouth daily.    . flecainide (TAMBOCOR) 50 MG tablet Take 1 tablet (50 mg total) by mouth 2 (two) times daily. 60 tablet 3   No current facility-administered medications for this visit.      Past Medical History:  Diagnosis Date  . AICD (automatic cardioverter/defibrillator) present   . Aortic insufficiency     moderate aortic ins moderate/asymptomatic/normal LV cavity size.   . Carotid artery disease ([place])     less than 50% stenosis bilaterally  . Drug-induced gynecomastia     secondary to spironolactone  . Dyslipidemia   . ICD (implantable cardiac defibrillator), biventricular, in situ     Medtronic model  [id number]  . Ischemic cardiomyopathy     status post non-ST elevation microinfarction stent to the right coronary artery is a   . LV dysfunction     NYHA class I/prior ejection fraction 20-25% and an ejection fraction 7 2009 50-55%, ejection fraction 60% bedside echocardiogram July 2012   . PONV (postoperative nausea and vomiting)   . Pulmonary embolism (HCC)    Prior history of pulmonary embolism off Coumadin.  . Ventricular tachycardia (  [place])    Inducible ventricular polymorphic tachycardia status post ICD followed by upgrade with CRT-D 2007, battery change at 01/31/2011    ROS:   All systems reviewed and negative except as noted in the HPI.   Past Surgical History:  Procedure Laterality Date  . CARDIAC CATHETERIZATION    . CARDIAC CATHETERIZATION N/A 12/10/2015   Procedure: Right/Left Heart Cath and Coronary Angiography;  Surgeon: Peter M Martinique, MD;  Location: [facility];  Service: Cardiovascular;  Laterality: N/A;  . CARDIAC DEFIBRILLATOR PLACEMENT     medtronic, remote - yes  . CATARACT EXTRACTION W/PHACO Left 08/25/2014   Procedure: CATARACT EXTRACTION PHACO AND INTRAOCULAR LENS  PLACEMENT (IOC);  Surgeon: Tonny Branch, MD;  Location: [facility];  Service: Ophthalmology;  Laterality: Left;  CDE 5.79  . CATARACT EXTRACTION W/PHACO Right 09/18/2014   Procedure: CATARACT EXTRACTION PHACO AND INTRAOCULAR LENS PLACEMENT RIGHT EYE;  Surgeon: Tonny Branch, MD;  Location: [facility];  Service: Ophthalmology;  Laterality: Right;  CDE:3.35  . CORONARY STENT PLACEMENT    . EP IMPLANTABLE DEVICE N/A 05/03/2016   Procedure: BIV PPM Generator Changeout;  Surgeon: Evans Lance, MD;  Location: [facility];  Service: Cardiovascular;  Laterality: N/A;  . INGUINAL HERNIA REPAIR    . left breast biopsy    . RETROPUBIC PROSTATECTOMY    . THORACOTOMY     left  . THYROIDECTOMY       Family History  Problem Relation Age of Onset  . Diabetes type I Sister     daughter   . Lung cancer Brother     several types of cancer  . Lung cancer Brother     several types of cancer  . Lung cancer Brother   . Clotting disorder Neg Hx     also no repiratory disease   . Prostate cancer Neg Hx      Social History   Social History  . Marital status: Married    Spouse name: N/A  . Number of children: 2  . Years of education: N/A   Occupational History  . plumber - self employed Retired   Social History Main Topics  . Smoking status: Former Smoker    Packs/day: 1.00    Years: 43.00    Types: Cigarettes    Start date: 10/17/1957    Quit date: 12/13/2000  . Smokeless tobacco: Never Used     Comment: started at age 44  . Alcohol use No  . Drug use: No  . Sexual activity: Not on file   Other Topics Concern  . Not on file   Social History Narrative   Self employed Development worker, community; 2 daughters.      BP (!) 98/58   Pulse (!) 108   Ht 5\' 9"  (1.753 m)   Wt 144 lb 8 oz (65.5 kg)   BMI 21.34 kg/m  P- 150 by me Physical Exam:  stable appearing 75 yo man, NAD HEENT: Unremarkable Neck:  6 cm JVD, no thyromegally Back:  No CVA tenderness Lungs:  Clear with no wheezes, well healed ICD  incision. Reduced lung sounds. HEART:  Regular tachy rhythm, no murmurs, no rubs, no clicks Abd:  soft, positive bowel sounds, no organomegally, no rebound, no guarding Ext:  2 plus pulses, no edema, no cyanosis, no clubbing Skin:  No rashes no nodules Neuro:  CN II through XII intact, motor grossly intac  DEVICE  Normal device function.  See PaceArt for details. Underlying rhythm is atrial  tachycardia  Assess/Plan: 1. BiV PPM - his device is working normally. Will recheck in several months. 2. Chronic systolic heart failure - his EF has nearly normalized. His symptoms have recently worsened which correlate with his SVT.  3. COPD - he will continue his bronchodilators. 4. SVT - he is conducting mostly today and is in atrial tachycardia. I have paced him back to NSR at 63/min. He immediately felt better. I have discussed his treatment options. He has fairly severe lung disease. I have recommended a trial of flecainide. He carries a diagnosis of CAD but at cardiac cath had normal LV function and minimal non-obstructive disease. I have recommended low dose flecainide. Will plan a stress test to rule out proarrhythmia and we will watch him carefully. If has any issues, then catheter ablation would be an option as well.  Gregg Taylor,M.D.  Mikle Bosworth.D.

## 2016-08-03 NOTE — Telephone Encounter (Signed)
Called pts wife back and let her know that Dr. Lovena Le would like the pt to send in another transmission, to evaluate pts HR rhythm at present moment. Pts wife voiced understanding and stated that she would.

## 2016-08-03 NOTE — Telephone Encounter (Signed)
Called pts wife, to check pts BP, pts wife stated that BP 89/78, HR 108. She stated that pt did not feel dizzy or faint. Informed pts wife that his information would be reviewed with Dr. Lovena Le and would call back with recommendations. Pt's wife voiced understanding.

## 2016-08-03 NOTE — Telephone Encounter (Signed)
Called pts wife back and let her know that Dr. Lovena Le advised that if pt was feeling bad he could come to the office for Dr. Lovena Le to pace terminate him otherwise the rhythm would stop on its own. Pts wife stated that pt would come to the office.

## 2016-08-03 NOTE — Telephone Encounter (Signed)
Pt wife called back and stated that pt heart rate is 143 and has been this way for 3 hours. Requested pt wife to send a remote transmission. Pt wife verbalized understanding.

## 2016-08-03 NOTE — Patient Instructions (Addendum)
Medication Instructions:  Your physician has recommended you make the following change in your medication:  1) START Flecainide 50 mg twice a day  Labwork: None Ordered   Testing/Procedures: Your physician has requested that you have an exercise tolerance test in 3 weeks when Dr. Lovena Le is scheduled.   Follow-Up: Follow-up to be determined after stress test. Remote monitoring continue as scheduled.  Any Other Special Instructions Will Be Listed Below (If Applicable).     If you need a refill on your cardiac medications before your next appointment, please call your pharmacy.

## 2016-08-03 NOTE — Telephone Encounter (Signed)
Spoke w/ pt wife and informed her that the Medtronic rep was going to follow up w/ MD about interrogation from ER visit. Informed her once we were informed on what to do we would call her back pt wife verbalized understanding.

## 2016-08-05 LAB — CUP PACEART INCLINIC DEVICE CHECK
Brady Statistic AP VS Percent: 0.03 %
Brady Statistic AS VP Percent: 41.74 %
Brady Statistic RA Percent Paced: 42 %
Implantable Lead Implant Date: 20050810
Implantable Lead Implant Date: 20050810
Implantable Lead Implant Date: 20071015
Implantable Lead Location: 753859
Implantable Lead Model: 5076
Lead Channel Impedance Value: 247 Ohm
Lead Channel Impedance Value: 456 Ohm
Lead Channel Impedance Value: 494 Ohm
Lead Channel Impedance Value: 513 Ohm
Lead Channel Impedance Value: 646 Ohm
Lead Channel Pacing Threshold Amplitude: 1.625 V
Lead Channel Pacing Threshold Pulse Width: 0.4 ms
Lead Channel Setting Pacing Amplitude: 1.5 V
Lead Channel Setting Pacing Amplitude: 2.5 V
Lead Channel Setting Pacing Amplitude: 3.5 V
Lead Channel Setting Pacing Pulse Width: 0.6 ms
MDC IDC LEAD LOCATION: 753858
MDC IDC LEAD LOCATION: 753860
MDC IDC LEAD MODEL: 4194
MDC IDC LEAD MODEL: 6949
MDC IDC MSMT BATTERY REMAINING LONGEVITY: 61 mo
MDC IDC MSMT BATTERY VOLTAGE: 3.01 V
MDC IDC MSMT LEADCHNL LV IMPEDANCE VALUE: 399 Ohm
MDC IDC MSMT LEADCHNL LV IMPEDANCE VALUE: 589 Ohm
MDC IDC MSMT LEADCHNL RA IMPEDANCE VALUE: 513 Ohm
MDC IDC MSMT LEADCHNL RA PACING THRESHOLD AMPLITUDE: 0.5 V
MDC IDC MSMT LEADCHNL RA SENSING INTR AMPL: 5.375 mV
MDC IDC MSMT LEADCHNL RV IMPEDANCE VALUE: 399 Ohm
MDC IDC MSMT LEADCHNL RV PACING THRESHOLD PULSEWIDTH: 0.4 ms
MDC IDC MSMT LEADCHNL RV SENSING INTR AMPL: 10.125 mV
MDC IDC SESS DTM: 20171019023128
MDC IDC SET LEADCHNL LV PACING PULSEWIDTH: 1 ms
MDC IDC SET LEADCHNL RV SENSING SENSITIVITY: 2.8 mV
MDC IDC STAT BRADY AP VP PERCENT: 41.97 %
MDC IDC STAT BRADY AS VS PERCENT: 16.26 %
MDC IDC STAT BRADY RV PERCENT PACED: 83.71 %

## 2016-08-08 ENCOUNTER — Encounter: Payer: Medicare Other | Admitting: Internal Medicine

## 2016-08-09 ENCOUNTER — Telehealth: Payer: Self-pay | Admitting: Cardiology

## 2016-08-09 DIAGNOSIS — J449 Chronic obstructive pulmonary disease, unspecified: Secondary | ICD-10-CM | POA: Diagnosis not present

## 2016-08-09 DIAGNOSIS — I10 Essential (primary) hypertension: Secondary | ICD-10-CM | POA: Diagnosis not present

## 2016-08-09 DIAGNOSIS — Z299 Encounter for prophylactic measures, unspecified: Secondary | ICD-10-CM | POA: Diagnosis not present

## 2016-08-09 DIAGNOSIS — M25569 Pain in unspecified knee: Secondary | ICD-10-CM | POA: Diagnosis not present

## 2016-08-09 NOTE — Telephone Encounter (Signed)
Spoke with pt and reminded pt of remote transmission that is due today. Pt verbalized understanding.   

## 2016-08-12 NOTE — Telephone Encounter (Signed)
No ICM remote transmission received.  Patient has office appointment with Dr Lovena Le on 09/02/2016 for follow up.  Next ICM remote transmission scheduled for 10/03/2016.

## 2016-08-19 ENCOUNTER — Ambulatory Visit (INDEPENDENT_AMBULATORY_CARE_PROVIDER_SITE_OTHER): Payer: Medicare Other | Admitting: Internal Medicine

## 2016-08-19 ENCOUNTER — Encounter: Payer: Self-pay | Admitting: Internal Medicine

## 2016-08-19 DIAGNOSIS — Z79899 Other long term (current) drug therapy: Secondary | ICD-10-CM | POA: Diagnosis not present

## 2016-09-02 ENCOUNTER — Encounter: Payer: Medicare Other | Admitting: Internal Medicine

## 2016-09-05 DIAGNOSIS — Z23 Encounter for immunization: Secondary | ICD-10-CM | POA: Diagnosis not present

## 2016-09-06 ENCOUNTER — Telehealth: Payer: Self-pay | Admitting: Internal Medicine

## 2016-09-06 NOTE — Telephone Encounter (Signed)
Follow up  Pt voiced to call anytime before 1 pm.

## 2016-09-06 NOTE — Telephone Encounter (Signed)
New message       Pt c/o medication issue:  1. Name of Medication: flecainide  2. How are you currently taking this medication (dosage and times per day)? 50mg  bid 3. Are you having a reaction (difficulty breathing--STAT)? no 4. What is your medication issue? Medication is making pt dizzy.  Can he start taking 1 pill daily instead of 2 pills daily?

## 2016-09-07 MED ORDER — FLECAINIDE ACETATE 50 MG PO TABS
ORAL_TABLET | ORAL | 3 refills | Status: DC
Start: 2016-09-07 — End: 2016-11-24

## 2016-09-07 NOTE — Telephone Encounter (Signed)
Pt's wife calling fu on message from yesterday, message stated to call before 1pm, nurse was in clinic and didn't get this message until late pm. Pt can be reached on cell today @ [phone]

## 2016-09-07 NOTE — Telephone Encounter (Signed)
Called, spoke with pt. Informed Dr. Lovena Le recommended to decrease amount of Flecainide to 50 mg once daily. Pt may take another tablet (50 mg) if heart is racing. Pt verbalized understanding and agreed with plan. Informed to call our office if dizziness does not improve, or if symptoms persist.

## 2016-09-21 DIAGNOSIS — M25569 Pain in unspecified knee: Secondary | ICD-10-CM | POA: Diagnosis not present

## 2016-09-21 DIAGNOSIS — I10 Essential (primary) hypertension: Secondary | ICD-10-CM | POA: Diagnosis not present

## 2016-09-21 DIAGNOSIS — J449 Chronic obstructive pulmonary disease, unspecified: Secondary | ICD-10-CM | POA: Diagnosis not present

## 2016-09-21 DIAGNOSIS — Z299 Encounter for prophylactic measures, unspecified: Secondary | ICD-10-CM | POA: Diagnosis not present

## 2016-10-03 ENCOUNTER — Telehealth: Payer: Self-pay

## 2016-10-03 NOTE — Telephone Encounter (Signed)
Attempted ICM call to request remote transmission and no answer.  No answering machine.

## 2016-10-05 ENCOUNTER — Emergency Department (HOSPITAL_COMMUNITY)
Admission: EM | Admit: 2016-10-05 | Discharge: 2016-10-05 | Disposition: A | Discharge status: Home / Self Care | Payer: Medicare Other | Source: Home / Self Care | Attending: Emergency Medicine | Admitting: Emergency Medicine

## 2016-10-05 ENCOUNTER — Encounter (HOSPITAL_COMMUNITY): Payer: Self-pay | Admitting: Vascular Surgery

## 2016-10-05 ENCOUNTER — Emergency Department (HOSPITAL_COMMUNITY): Payer: Medicare Other

## 2016-10-05 DIAGNOSIS — W228XXA Striking against or struck by other objects, initial encounter: Secondary | ICD-10-CM

## 2016-10-05 DIAGNOSIS — R079 Chest pain, unspecified: Secondary | ICD-10-CM | POA: Diagnosis not present

## 2016-10-05 DIAGNOSIS — Z955 Presence of coronary angioplasty implant and graft: Secondary | ICD-10-CM | POA: Insufficient documentation

## 2016-10-05 DIAGNOSIS — Z79899 Other long term (current) drug therapy: Secondary | ICD-10-CM

## 2016-10-05 DIAGNOSIS — Z87891 Personal history of nicotine dependence: Secondary | ICD-10-CM | POA: Insufficient documentation

## 2016-10-05 DIAGNOSIS — R0602 Shortness of breath: Secondary | ICD-10-CM | POA: Diagnosis not present

## 2016-10-05 DIAGNOSIS — Y999 Unspecified external cause status: Secondary | ICD-10-CM

## 2016-10-05 DIAGNOSIS — J432 Centrilobular emphysema: Secondary | ICD-10-CM | POA: Diagnosis not present

## 2016-10-05 DIAGNOSIS — S2222XA Fracture of body of sternum, initial encounter for closed fracture: Secondary | ICD-10-CM | POA: Insufficient documentation

## 2016-10-05 DIAGNOSIS — I5022 Chronic systolic (congestive) heart failure: Secondary | ICD-10-CM | POA: Insufficient documentation

## 2016-10-05 DIAGNOSIS — Z95810 Presence of automatic (implantable) cardiac defibrillator: Secondary | ICD-10-CM

## 2016-10-05 DIAGNOSIS — Y9389 Activity, other specified: Secondary | ICD-10-CM

## 2016-10-05 DIAGNOSIS — J189 Pneumonia, unspecified organism: Secondary | ICD-10-CM | POA: Diagnosis not present

## 2016-10-05 DIAGNOSIS — I110 Hypertensive heart disease with heart failure: Secondary | ICD-10-CM

## 2016-10-05 DIAGNOSIS — Y929 Unspecified place or not applicable: Secondary | ICD-10-CM | POA: Insufficient documentation

## 2016-10-05 DIAGNOSIS — S20219A Contusion of unspecified front wall of thorax, initial encounter: Secondary | ICD-10-CM

## 2016-10-05 DIAGNOSIS — I2510 Atherosclerotic heart disease of native coronary artery without angina pectoris: Secondary | ICD-10-CM

## 2016-10-05 DIAGNOSIS — Z7982 Long term (current) use of aspirin: Secondary | ICD-10-CM | POA: Insufficient documentation

## 2016-10-05 DIAGNOSIS — I5042 Chronic combined systolic (congestive) and diastolic (congestive) heart failure: Secondary | ICD-10-CM | POA: Diagnosis not present

## 2016-10-05 DIAGNOSIS — A419 Sepsis, unspecified organism: Secondary | ICD-10-CM | POA: Diagnosis not present

## 2016-10-05 LAB — CBC
HCT: 41.7 % (ref 39.0–52.0)
HEMOGLOBIN: 14 g/dL (ref 13.0–17.0)
MCH: 30.2 pg (ref 26.0–34.0)
MCHC: 33.6 g/dL (ref 30.0–36.0)
MCV: 89.9 fL (ref 78.0–100.0)
Platelets: 179 10*3/uL (ref 150–400)
RBC: 4.64 MIL/uL (ref 4.22–5.81)
RDW: 14 % (ref 11.5–15.5)
WBC: 8.1 10*3/uL (ref 4.0–10.5)

## 2016-10-05 LAB — BASIC METABOLIC PANEL
ANION GAP: 9 (ref 5–15)
BUN: 30 mg/dL — AB (ref 6–20)
CALCIUM: 9.3 mg/dL (ref 8.9–10.3)
CO2: 29 mmol/L (ref 22–32)
Chloride: 102 mmol/L (ref 101–111)
Creatinine, Ser: 1.28 mg/dL — ABNORMAL HIGH (ref 0.61–1.24)
GFR calc Af Amer: >60 mL/min (ref >60)
GFR, EST NON AFRICAN AMERICAN: 53 mL/min — AB (ref >60)
GLUCOSE: 134 mg/dL — AB (ref 65–99)
Potassium: 3.8 mmol/L (ref 3.5–5.1)
SODIUM: 140 mmol/L (ref 135–145)

## 2016-10-05 LAB — I-STAT TROPONIN, ED: TROPONIN I, POC: 0.01 ng/mL (ref 0.00–0.08)

## 2016-10-05 MED ORDER — ONDANSETRON 4 MG PO TBDP: 4.0000 mg | ORAL_TABLET | Freq: Three times a day (TID) | ORAL | 0 refills | Status: DC | PRN

## 2016-10-05 MED ORDER — HYDROCODONE-ACETAMINOPHEN 5-325 MG PO TABS: 1.0000 | ORAL_TABLET | ORAL | 0 refills | Status: DC | PRN

## 2016-10-05 MED ORDER — ONDANSETRON 4 MG PO TBDP
4.0000 mg | ORAL_TABLET | Freq: Once | ORAL | Status: AC
  Administered 2016-10-05: 4 mg via ORAL
  Filled 2016-10-05: qty 1

## 2016-10-05 MED ORDER — HYDROCODONE-ACETAMINOPHEN 5-325 MG PO TABS
2.0000 | ORAL_TABLET | Freq: Once | ORAL | Status: AC
  Administered 2016-10-05: 2 via ORAL
  Filled 2016-10-05: qty 2

## 2016-10-05 NOTE — Discharge Instructions (Signed)
Take deep breaths, or cough 10 times per hour to keep lungs inflated. Holding a small pillow against chest helps with pain.  [place] for pain.  Recheck with PCP as needed.  Return to Er with fever or Shortness of breath.

## 2016-10-05 NOTE — ED Provider Notes (Signed)
[facility] DEPT Provider Note   CSN: [id number] Arrival date & time: 10/05/16  1705  By signing my name below, I, [name], attest that this documentation has been prepared under the direction and in the presence of Tanna Furry, MD. Electronically Signed: Reola Andrews, ED Scribe. 10/06/16. 5:28 PM.  History   Chief Complaint Chief Complaint  Patient presents with  . Chest Injury   The history is provided by the patient. No language interpreter was used.    HPI Comments: [name] is a 75 y.o. male with a PMHx AICD placement, CHF, and prior cardiac catheterization, who presents to the Emergency Department complaining of sudden onset, gradually worsening centralized chest pain onset s/p injury which occurred just prior to arrival. Pt states that he was working on a ladder with a drill this evening when the drill suddenly jerked backwards, striking him mid-sternally. Pt denies any LOC, head injury, or falling event occurring during this. Pt notes associated swelling over the area secondary to this incident. His pain to the area is exacerbated with palpation over the area. No noted treatments for his symptoms were tried prior to coming into the ED. He denies shortness of breath, or any other associated symptoms.   Past Medical History:  Diagnosis Date  . AICD (automatic cardioverter/defibrillator) present   . Aortic insufficiency     moderate aortic ins moderate/asymptomatic/normal LV cavity size.   . Carotid artery disease ([place])     less than 50% stenosis bilaterally  . Drug-induced gynecomastia     secondary to spironolactone  . Dyslipidemia   . ICD (implantable cardiac defibrillator), biventricular, in situ     Medtronic model  [id number]  . Ischemic cardiomyopathy     status post non-ST elevation microinfarction stent to the right coronary artery is a   . LV dysfunction     NYHA class I/prior ejection fraction 20-25% and an ejection fraction 7 2009 50-55%,  ejection fraction 60% bedside echocardiogram July 2012   . PONV (postoperative nausea and vomiting)   . Pulmonary embolism (HCC)    Prior history of pulmonary embolism off Coumadin.  . Ventricular tachycardia ([place])    Inducible ventricular polymorphic tachycardia status post ICD followed by upgrade with CRT-D 2007, battery change at 01/31/2011   Patient Active Problem List   Diagnosis Date Noted  . Pulmonary hypertension 12/14/2015  . Coronary artery disease involving native coronary artery without angina pectoris 12/14/2015  . Laryngopharyngeal reflux (LPR) 12/14/2015  . Abnormal nuclear stress test 12/10/2015  . Dyspnea 12/10/2015  . Atrial tachycardia ([place]) 10/06/2015  . Chronic systolic heart failure ([place]) 05/22/2012  . Ischemic cardiomyopathy   . LV dysfunction   . Ventricular tachycardia ([place])   . Drug-induced gynecomastia   . Carotid artery disease ([place])   . Aortic insufficiency   . Dyslipidemia   . Pulmonary embolism ([place])   . Biventricular implantable cardioverter-defibrillator in situ   . DYSLIPIDEMIA 08/23/2010  . CAROTID BRUIT 08/23/2010  . Centrilobular emphysema ([place]) 04/10/2009  . NEPHROLITHIASIS 04/10/2009  . RENAL CALCULUS, HX OF 04/10/2009   Past Surgical History:  Procedure Laterality Date  . CARDIAC CATHETERIZATION    . CARDIAC CATHETERIZATION N/A 12/10/2015   Procedure: Right/Left Heart Cath and Coronary Angiography;  Surgeon: Peter M Martinique, MD;  Location: [facility];  Service: Cardiovascular;  Laterality: N/A;  . CARDIAC DEFIBRILLATOR PLACEMENT     medtronic, remote - yes  . CATARACT EXTRACTION W/PHACO Left 08/25/2014   Procedure: CATARACT EXTRACTION  PHACO AND INTRAOCULAR LENS PLACEMENT (IOC);  Surgeon: Tonny Branch, MD;  Location: [facility];  Service: Ophthalmology;  Laterality: Left;  CDE 5.79  . CATARACT EXTRACTION W/PHACO Right 09/18/2014   Procedure: CATARACT EXTRACTION PHACO AND INTRAOCULAR LENS PLACEMENT RIGHT EYE;  Surgeon: Tonny Branch, MD;   Location: [facility];  Service: Ophthalmology;  Laterality: Right;  CDE:3.35  . CORONARY STENT PLACEMENT    . EP IMPLANTABLE DEVICE N/A 05/03/2016   Procedure: BIV PPM Generator Changeout;  Surgeon: Evans Lance, MD;  Location: [facility];  Service: Cardiovascular;  Laterality: N/A;  . INGUINAL HERNIA REPAIR    . left breast biopsy    . RETROPUBIC PROSTATECTOMY    . THORACOTOMY     left  . THYROIDECTOMY      Home Medications    Prior to Admission medications   Medication Sig Start Date End Date Taking? Authorizing Provider  aspirin 81 MG tablet Take 81 mg by mouth daily.     Historical Provider, MD  calcium-vitamin D (OSCAL WITH D) 500-200 MG-UNIT tablet Take 1 tablet by mouth daily.    Historical Provider, MD  carvedilol (COREG) 25 MG tablet Take .5 (12.5 mg total) by mouth 2 (two) times daily with a meal. 07/26/16   Amber Sena Slate, NP  eplerenone (INSPRA) 25 MG tablet Take 1 tablet (25 mg total) by mouth daily. 10/25/11   Ezra Sites, MD  flecainide (TAMBOCOR) 50 MG tablet Take 1 tablet (50 mg) daily. May take additional tablet (50 mg) if heart is racing. 09/07/16   Evans Lance, MD  fluticasone furoate-vilanterol (BREO ELLIPTA) 100-25 MCG/INH AEPB Inhale 1 puff into the lungs daily. 02/04/16   Noralee Space, MD  furosemide (LASIX) 20 MG tablet Take 3 tablets (60 mg total) by mouth daily. Patient taking differently: No sig reported 01/11/16   Evans Lance, MD  HYDROcodone-acetaminophen (NORCO/VICODIN) 5-325 MG tablet Take 1 tablet by mouth every 4 (four) hours as needed. 10/05/16   Tanna Furry, MD  Hydrocortisone-Aloe Vera (CORTIZONE-10 PLUS EX) Apply 1 application topically as directed. Use as needed    Historical Provider, MD  levothyroxine (SYNTHROID, LEVOTHROID) 112 MCG tablet Take 112 mcg by mouth daily before breakfast.    Historical Provider, MD  Multiple Vitamins-Minerals (MULTIVITAMIN WITH MINERALS) tablet Take 1 tablet by mouth daily.    Historical Provider, MD  niacin  500 MG tablet Take 500 mg by mouth daily with breakfast.      Historical Provider, MD  omeprazole (PRILOSEC) 40 MG capsule Take 40 mg by mouth daily.    Historical Provider, MD  ondansetron (ZOFRAN ODT) 4 MG disintegrating tablet Take 1 tablet (4 mg total) by mouth every 8 (eight) hours as needed for nausea. 10/05/16   Tanna Furry, MD  simvastatin (ZOCOR) 20 MG tablet Take 1 tablet (20 mg total) by mouth at bedtime. 10/25/11   Ezra Sites, MD  trandolapril (MAVIK) 2 MG tablet Take 3 mg by mouth daily. 11/27/15   Historical Provider, MD   Family History Family History  Problem Relation Age of Onset  . Diabetes type I Sister     daughter   . Lung cancer Brother     several types of cancer  . Lung cancer Brother     several types of cancer  . Lung cancer Brother   . Clotting disorder Neg Hx     also no repiratory disease   . Prostate cancer Neg Hx  Social History Social History  Substance Use Topics  . Smoking status: Former Smoker    Packs/day: 1.00    Years: 43.00    Types: Cigarettes    Start date: 10/17/1957    Quit date: 12/13/2000  . Smokeless tobacco: Never Used     Comment: started at age 34  . Alcohol use No   Allergies   Patient has no known allergies.  Review of Systems Review of Systems  Constitutional: Negative for appetite change, chills, diaphoresis, fatigue and fever.  HENT: Negative for mouth sores, sore throat and trouble swallowing.   Eyes: Negative for visual disturbance.  Respiratory: Negative for cough, chest tightness, shortness of breath and wheezing.   Cardiovascular: Positive for chest pain (wall).  Gastrointestinal: Negative for abdominal distention, abdominal pain, diarrhea, nausea and vomiting.  Endocrine: Negative for polydipsia, polyphagia and polyuria.  Genitourinary: Negative for dysuria, frequency and hematuria.  Musculoskeletal: Positive for myalgias. Negative for gait problem.  Skin: Negative for color change, pallor and rash.    Neurological: Negative for dizziness, syncope, light-headedness and headaches.  Hematological: Does not bruise/bleed easily.  Psychiatric/Behavioral: Negative for behavioral problems and confusion.   Physical Exam Updated Vital Signs BP 134/66 (BP Location: Left Arm)   Pulse 70   Temp 97.5 F (36.4 C) (Oral)   Resp 16   SpO2 93%   Physical Exam  Constitutional: He is oriented to person, place, and time. He appears well-developed and well-nourished. No distress.  HENT:  Head: Normocephalic.  Eyes: Conjunctivae are normal. Pupils are equal, round, and reactive to light. No scleral icterus.  Neck: Normal range of motion. Neck supple. No thyromegaly present.  Cardiovascular: Normal rate and regular rhythm.  Exam reveals no gallop and no friction rub.   No murmur heard. Pulmonary/Chest: Effort normal and breath sounds normal. No respiratory distress. He has no wheezes. He has no rales. He exhibits tenderness.  Soft tissue and swelling and marked tenderness mid-sternum 6cm above xyphoid process. ?sternal fx. No crepitus.   Abdominal: Soft. Bowel sounds are normal. He exhibits no distension. There is no tenderness. There is no rebound.  Musculoskeletal: Normal range of motion.  Neurological: He is alert and oriented to person, place, and time.  Skin: Skin is warm and dry. No rash noted.  Psychiatric: He has a normal mood and affect. His behavior is normal.   ED Treatments / Results  DIAGNOSTIC STUDIES: Oxygen Saturation is 93% on RA, adequate by my interpretation.   COORDINATION OF CARE: 7:17 PM-Discussed next steps with pt. Pt verbalized understanding and is agreeable with the plan.   Labs (all labs ordered are listed, but only abnormal results are displayed) Labs Reviewed  BASIC METABOLIC PANEL - Abnormal; Notable for the following:       Result Value   Glucose, Bld 134 (*)    BUN 30 (*)    Creatinine, Ser 1.28 (*)    GFR calc non Af Amer 53 (*)    All other components  within normal limits  CBC  I-STAT TROPOININ, ED    EKG  EKG Interpretation  Date/Time:  Wednesday October 05 2016 17:18:39 EST Ventricular Rate:  71 PR Interval:  128 QRS Duration: 174 QT Interval:  494 QTC Calculation: 536 R Axis:   -109 Text Interpretation:  Atrial-sensed ventricular-paced rhythm with occasional Premature ventricular complexes Abnormal ECG Confirmed by Jeneen Rinks  MD, [facility] ([id number]) on 10/05/2016 6:45:50 PM      Radiology Dg Chest 2 View  Result Date: 10/05/2016 CLINICAL  DATA:  Central chest pain after drilling above head, dropped drill onto chest. History of COPD, ischemic cardiomyopathy and pulmonary embolism. EXAM: CHEST  2 VIEW COMPARISON:  Chest radiograph August 01, 2016 and CT chest December 22, 2015 FINDINGS: The cardiac silhouette is mildly enlarged. Coronary artery calcifications versus stent. Similar fullness of LEFT pulmonary hila. Increased lung volumes with strandy densities in lung bases. Blunting of the posterior costophrenic angles. No pneumothorax. Three lead LEFT cardiac pacemaker in situ. Surgical clips in the neck most compatible with thyroidectomy. Old single level mid thoracic severe compression fracture. IMPRESSION: Mild cardiomegaly. COPD and bibasilar atelectasis/ scarring. Small posterior pleural effusions versus pleural thickening. Fullness of the hila suggest vascular engorgement. Electronically Signed   By: Elon Alas M.D.   On: 10/05/2016 17:54   Procedures Procedures  Medications Ordered in ED Medications  ondansetron (ZOFRAN-ODT) disintegrating tablet 4 mg (4 mg Oral Given 10/05/16 1953)  HYDROcodone-acetaminophen (NORCO/VICODIN) 5-325 MG per tablet 2 tablet (2 tablets Oral Given 10/05/16 1954)    Initial Impression / Assessment and Plan / ED Course  I have reviewed the triage vital signs and the nursing notes.  Pertinent labs & imaging results that were available during my care of the patient were reviewed by me and considered in  my medical decision making (see chart for details).  Clinical Course    X-ray show no acute abnormality's. Normal EKG. Heart rate plan will be symptom control. Although sternal fracture not visualized on AP or lateral chest x-ray clinically this is possible if not likely. Plan will be home a toilet, this was discussed with him at length, as well as pain control.  Final Clinical Impressions(s) / ED Diagnoses   Final diagnoses:  Contusion of chest wall, unspecified laterality, initial encounter  Closed fracture of body of sternum, initial encounter   New Prescriptions Discharge Medication List as of 10/05/2016  7:35 PM    START taking these medications   Details  HYDROcodone-acetaminophen (NORCO/VICODIN) 5-325 MG tablet Take 1 tablet by mouth every 4 (four) hours as needed., Starting Wed 10/05/2016, Print    ondansetron (ZOFRAN ODT) 4 MG disintegrating tablet Take 1 tablet (4 mg total) by mouth every 8 (eight) hours as needed for nausea., Starting Wed 10/05/2016, Print           Tanna Furry, MD 10/06/16 1729

## 2016-10-05 NOTE — ED Triage Notes (Signed)
Pt reports to the ED for eval of injury to his chest. Reports he was standing on a ladder and he was drilling something and the drill came back and hit him in the chest. Pt reports pain in the midsternal region of his chest where the impact was. Pt denies any SOB. Some swelling noted to the midsternal region. Pt has significant cardiac hx and pacemaker. Denies any head injury, fall from ladder, or LOC.

## 2016-10-07 DIAGNOSIS — Z713 Dietary counseling and surveillance: Secondary | ICD-10-CM | POA: Diagnosis not present

## 2016-10-07 DIAGNOSIS — Z6821 Body mass index (BMI) 21.0-21.9, adult: Secondary | ICD-10-CM | POA: Diagnosis not present

## 2016-10-07 DIAGNOSIS — S20219A Contusion of unspecified front wall of thorax, initial encounter: Secondary | ICD-10-CM | POA: Diagnosis not present

## 2016-10-07 DIAGNOSIS — Z299 Encounter for prophylactic measures, unspecified: Secondary | ICD-10-CM | POA: Diagnosis not present

## 2016-10-07 DIAGNOSIS — J449 Chronic obstructive pulmonary disease, unspecified: Secondary | ICD-10-CM | POA: Diagnosis not present

## 2016-10-08 ENCOUNTER — Emergency Department (HOSPITAL_COMMUNITY): Payer: Medicare Other

## 2016-10-08 ENCOUNTER — Encounter (HOSPITAL_COMMUNITY): Payer: Self-pay | Admitting: Emergency Medicine

## 2016-10-08 ENCOUNTER — Inpatient Hospital Stay (HOSPITAL_COMMUNITY): Payer: Medicare Other

## 2016-10-08 ENCOUNTER — Inpatient Hospital Stay (HOSPITAL_COMMUNITY)
Admission: EM | Admit: 2016-10-08 | Discharge: 2016-10-10 | DRG: 183 | Disposition: A | Discharge status: Home / Self Care | Payer: Medicare Other | Attending: Internal Medicine | Admitting: Internal Medicine

## 2016-10-08 DIAGNOSIS — J181 Lobar pneumonia, unspecified organism: Secondary | ICD-10-CM | POA: Diagnosis not present

## 2016-10-08 DIAGNOSIS — I2510 Atherosclerotic heart disease of native coronary artery without angina pectoris: Secondary | ICD-10-CM | POA: Diagnosis present

## 2016-10-08 DIAGNOSIS — A419 Sepsis, unspecified organism: Secondary | ICD-10-CM | POA: Diagnosis not present

## 2016-10-08 DIAGNOSIS — Z955 Presence of coronary angioplasty implant and graft: Secondary | ICD-10-CM

## 2016-10-08 DIAGNOSIS — J189 Pneumonia, unspecified organism: Secondary | ICD-10-CM | POA: Diagnosis not present

## 2016-10-08 DIAGNOSIS — Z833 Family history of diabetes mellitus: Secondary | ICD-10-CM | POA: Diagnosis not present

## 2016-10-08 DIAGNOSIS — I5022 Chronic systolic (congestive) heart failure: Secondary | ICD-10-CM | POA: Diagnosis not present

## 2016-10-08 DIAGNOSIS — Z7982 Long term (current) use of aspirin: Secondary | ICD-10-CM

## 2016-10-08 DIAGNOSIS — Z87891 Personal history of nicotine dependence: Secondary | ICD-10-CM

## 2016-10-08 DIAGNOSIS — Z9842 Cataract extraction status, left eye: Secondary | ICD-10-CM | POA: Diagnosis not present

## 2016-10-08 DIAGNOSIS — I5042 Chronic combined systolic (congestive) and diastolic (congestive) heart failure: Secondary | ICD-10-CM | POA: Diagnosis present

## 2016-10-08 DIAGNOSIS — Z95810 Presence of automatic (implantable) cardiac defibrillator: Secondary | ICD-10-CM

## 2016-10-08 DIAGNOSIS — E039 Hypothyroidism, unspecified: Secondary | ICD-10-CM | POA: Diagnosis present

## 2016-10-08 DIAGNOSIS — Z801 Family history of malignant neoplasm of trachea, bronchus and lung: Secondary | ICD-10-CM | POA: Diagnosis not present

## 2016-10-08 DIAGNOSIS — I739 Peripheral vascular disease, unspecified: Secondary | ICD-10-CM

## 2016-10-08 DIAGNOSIS — J432 Centrilobular emphysema: Secondary | ICD-10-CM | POA: Diagnosis not present

## 2016-10-08 DIAGNOSIS — W228XXA Striking against or struck by other objects, initial encounter: Secondary | ICD-10-CM | POA: Diagnosis present

## 2016-10-08 DIAGNOSIS — I472 Ventricular tachycardia, unspecified: Secondary | ICD-10-CM

## 2016-10-08 DIAGNOSIS — Z7952 Long term (current) use of systemic steroids: Secondary | ICD-10-CM | POA: Diagnosis not present

## 2016-10-08 DIAGNOSIS — Z961 Presence of intraocular lens: Secondary | ICD-10-CM | POA: Diagnosis present

## 2016-10-08 DIAGNOSIS — R0602 Shortness of breath: Secondary | ICD-10-CM | POA: Diagnosis not present

## 2016-10-08 DIAGNOSIS — S2222XA Fracture of body of sternum, initial encounter for closed fracture: Secondary | ICD-10-CM | POA: Diagnosis present

## 2016-10-08 DIAGNOSIS — Y9389 Activity, other specified: Secondary | ICD-10-CM

## 2016-10-08 DIAGNOSIS — I779 Disorder of arteries and arterioles, unspecified: Secondary | ICD-10-CM | POA: Diagnosis present

## 2016-10-08 DIAGNOSIS — E785 Hyperlipidemia, unspecified: Secondary | ICD-10-CM | POA: Diagnosis present

## 2016-10-08 DIAGNOSIS — I255 Ischemic cardiomyopathy: Secondary | ICD-10-CM | POA: Diagnosis present

## 2016-10-08 DIAGNOSIS — K219 Gastro-esophageal reflux disease without esophagitis: Secondary | ICD-10-CM | POA: Diagnosis present

## 2016-10-08 DIAGNOSIS — Z9841 Cataract extraction status, right eye: Secondary | ICD-10-CM | POA: Diagnosis not present

## 2016-10-08 DIAGNOSIS — Z86711 Personal history of pulmonary embolism: Secondary | ICD-10-CM

## 2016-10-08 DIAGNOSIS — I471 Supraventricular tachycardia: Secondary | ICD-10-CM | POA: Diagnosis not present

## 2016-10-08 LAB — I-STAT CHEM 8, ED
BUN: 31 mg/dL — AB (ref 6–20)
Calcium, Ion: 1.16 mmol/L (ref 1.15–1.40)
Chloride: 98 mmol/L — ABNORMAL LOW (ref 101–111)
Creatinine, Ser: 1.1 mg/dL (ref 0.61–1.24)
Glucose, Bld: 127 mg/dL — ABNORMAL HIGH (ref 65–99)
HEMATOCRIT: 46 % (ref 39.0–52.0)
Hemoglobin: 15.6 g/dL (ref 13.0–17.0)
POTASSIUM: 4.3 mmol/L (ref 3.5–5.1)
SODIUM: 140 mmol/L (ref 135–145)
TCO2: 33 mmol/L (ref 0–100)

## 2016-10-08 LAB — COMPREHENSIVE METABOLIC PANEL
ALBUMIN: 4.3 g/dL (ref 3.5–5.0)
ALT: 9 U/L — AB (ref 17–63)
AST: 23 U/L (ref 15–41)
Alkaline Phosphatase: 64 U/L (ref 38–126)
Anion gap: 10 (ref 5–15)
BUN: 25 mg/dL — AB (ref 6–20)
CHLORIDE: 99 mmol/L — AB (ref 101–111)
CO2: 31 mmol/L (ref 22–32)
CREATININE: 1.13 mg/dL (ref 0.61–1.24)
Calcium: 9.2 mg/dL (ref 8.9–10.3)
GFR calc Af Amer: >60 mL/min (ref >60)
GFR calc non Af Amer: >60 mL/min (ref >60)
GLUCOSE: 131 mg/dL — AB (ref 65–99)
POTASSIUM: 4 mmol/L (ref 3.5–5.1)
Sodium: 140 mmol/L (ref 135–145)
Total Bilirubin: 1.4 mg/dL — ABNORMAL HIGH (ref 0.3–1.2)
Total Protein: 7.1 g/dL (ref 6.5–8.1)

## 2016-10-08 LAB — APTT: APTT: 25 s (ref 24–36)

## 2016-10-08 LAB — LACTIC ACID, PLASMA
LACTIC ACID, VENOUS: 1.7 mmol/L (ref 0.5–1.9)
LACTIC ACID, VENOUS: 2.2 mmol/L — AB (ref 0.5–1.9)

## 2016-10-08 LAB — BRAIN NATRIURETIC PEPTIDE: B NATRIURETIC PEPTIDE 5: 240 pg/mL — AB (ref 0.0–100.0)

## 2016-10-08 LAB — CBC WITH DIFFERENTIAL/PLATELET
BASOS ABS: 0 10*3/uL (ref 0.0–0.1)
BASOS PCT: 0 %
EOS PCT: 0 %
Eosinophils Absolute: 0 10*3/uL (ref 0.0–0.7)
HCT: 46.7 % (ref 39.0–52.0)
Hemoglobin: 15.2 g/dL (ref 13.0–17.0)
Lymphocytes Relative: 6 %
Lymphs Abs: 0.9 10*3/uL (ref 0.7–4.0)
MCH: 30 pg (ref 26.0–34.0)
MCHC: 32.5 g/dL (ref 30.0–36.0)
MCV: 92.1 fL (ref 78.0–100.0)
MONO ABS: 0.4 10*3/uL (ref 0.1–1.0)
Monocytes Relative: 3 %
Neutro Abs: 12.8 10*3/uL — ABNORMAL HIGH (ref 1.7–7.7)
Neutrophils Relative %: 91 %
PLATELETS: 172 10*3/uL (ref 150–400)
RBC: 5.07 MIL/uL (ref 4.22–5.81)
RDW: 14.1 % (ref 11.5–15.5)
WBC: 14.1 10*3/uL — AB (ref 4.0–10.5)

## 2016-10-08 LAB — I-STAT TROPONIN, ED: Troponin i, poc: 0.01 ng/mL (ref 0.00–0.08)

## 2016-10-08 LAB — I-STAT CG4 LACTIC ACID, ED: Lactic Acid, Venous: 2.8 mmol/L (ref 0.5–1.9)

## 2016-10-08 LAB — PROTIME-INR
INR: 1.01
PROTHROMBIN TIME: 13.3 s (ref 11.4–15.2)

## 2016-10-08 LAB — PROCALCITONIN: PROCALCITONIN: 1.08 ng/mL

## 2016-10-08 MED ORDER — IPRATROPIUM BROMIDE 0.02 % IN SOLN
RESPIRATORY_TRACT | Status: AC
  Filled 2016-10-08: qty 5

## 2016-10-08 MED ORDER — BISACODYL 5 MG PO TBEC: 5.0000 mg | DELAYED_RELEASE_TABLET | Freq: Every day | ORAL | Status: DC | PRN

## 2016-10-08 MED ORDER — FLUTICASONE FUROATE-VILANTEROL 100-25 MCG/INH IN AEPB
1.0000 | INHALATION_SPRAY | Freq: Every day | RESPIRATORY_TRACT | Status: DC
  Filled 2016-10-08: qty 28

## 2016-10-08 MED ORDER — ALBUTEROL (5 MG/ML) CONTINUOUS INHALATION SOLN
10.0000 mg/h | INHALATION_SOLUTION | RESPIRATORY_TRACT | Status: AC
  Administered 2016-10-08: 10 mg/h via RESPIRATORY_TRACT

## 2016-10-08 MED ORDER — FUROSEMIDE 10 MG/ML IJ SOLN
40.0000 mg | Freq: Once | INTRAMUSCULAR | Status: AC
  Administered 2016-10-08: 40 mg via INTRAVENOUS
  Filled 2016-10-08: qty 4

## 2016-10-08 MED ORDER — DEXTROSE 5 % IV SOLN
INTRAVENOUS | Status: AC
  Filled 2016-10-08: qty 500

## 2016-10-08 MED ORDER — IPRATROPIUM BROMIDE 0.02 % IN SOLN
1.0000 mg | Freq: Once | RESPIRATORY_TRACT | Status: AC
  Administered 2016-10-08: 1 mg via RESPIRATORY_TRACT

## 2016-10-08 MED ORDER — ACETAMINOPHEN 325 MG PO TABS: 650.0000 mg | ORAL_TABLET | Freq: Four times a day (QID) | ORAL | Status: DC | PRN

## 2016-10-08 MED ORDER — ONDANSETRON HCL 4 MG/2ML IJ SOLN: 4.0000 mg | Freq: Four times a day (QID) | INTRAMUSCULAR | Status: DC | PRN

## 2016-10-08 MED ORDER — ALBUTEROL (5 MG/ML) CONTINUOUS INHALATION SOLN
INHALATION_SOLUTION | RESPIRATORY_TRACT | Status: AC
  Filled 2016-10-08: qty 20

## 2016-10-08 MED ORDER — CARVEDILOL 12.5 MG PO TABS
12.5000 mg | ORAL_TABLET | Freq: Two times a day (BID) | ORAL | Status: DC
  Administered 2016-10-08 – 2016-10-10 (×4): 12.5 mg via ORAL
  Filled 2016-10-08 (×5): qty 1

## 2016-10-08 MED ORDER — PREDNISONE 20 MG PO TABS: 20.0000 mg | ORAL_TABLET | Freq: Every day | ORAL | Status: DC

## 2016-10-08 MED ORDER — SODIUM CHLORIDE 0.9 % IV SOLN
INTRAVENOUS | Status: AC
  Filled 2016-10-08: qty 3

## 2016-10-08 MED ORDER — ALBUTEROL SULFATE (2.5 MG/3ML) 0.083% IN NEBU: 5.0000 mg | INHALATION_SOLUTION | Freq: Once | RESPIRATORY_TRACT | Status: DC

## 2016-10-08 MED ORDER — HYDROCODONE-ACETAMINOPHEN 5-325 MG PO TABS
1.0000 | ORAL_TABLET | Freq: Four times a day (QID) | ORAL | Status: DC | PRN
  Administered 2016-10-08 – 2016-10-10 (×4): 1 via ORAL
  Filled 2016-10-08 (×4): qty 1

## 2016-10-08 MED ORDER — AZITHROMYCIN 500 MG IV SOLR
500.0000 mg | Freq: Once | INTRAVENOUS | Status: AC
  Administered 2016-10-08: 500 mg via INTRAVENOUS
  Filled 2016-10-08: qty 500

## 2016-10-08 MED ORDER — SIMVASTATIN 20 MG PO TABS
20.0000 mg | ORAL_TABLET | Freq: Every day | ORAL | Status: DC
  Administered 2016-10-08 – 2016-10-09 (×2): 20 mg via ORAL
  Filled 2016-10-08 (×2): qty 1

## 2016-10-08 MED ORDER — DEXTROSE 5 % IV SOLN
500.0000 mg | INTRAVENOUS | Status: DC
  Administered 2016-10-09: 500 mg via INTRAVENOUS
  Filled 2016-10-08 (×2): qty 500

## 2016-10-08 MED ORDER — SODIUM CHLORIDE 0.9 % IV SOLN
3.0000 g | Freq: Three times a day (TID) | INTRAVENOUS | Status: DC
  Administered 2016-10-08 – 2016-10-10 (×5): 3 g via INTRAVENOUS
  Filled 2016-10-08 (×9): qty 3

## 2016-10-08 MED ORDER — ALBUTEROL SULFATE (2.5 MG/3ML) 0.083% IN NEBU: 2.5000 mg | INHALATION_SOLUTION | RESPIRATORY_TRACT | Status: DC | PRN

## 2016-10-08 MED ORDER — PANTOPRAZOLE SODIUM 40 MG PO TBEC
40.0000 mg | DELAYED_RELEASE_TABLET | Freq: Every day | ORAL | Status: DC
  Administered 2016-10-09: 40 mg via ORAL
  Filled 2016-10-08 (×2): qty 1

## 2016-10-08 MED ORDER — FLUTICASONE FUROATE-VILANTEROL 100-25 MCG/INH IN AEPB
1.0000 | INHALATION_SPRAY | Freq: Every day | RESPIRATORY_TRACT | Status: DC
  Administered 2016-10-10: 08:00:00 1 via RESPIRATORY_TRACT
  Filled 2016-10-08 (×2): qty 28

## 2016-10-08 MED ORDER — ASPIRIN EC 81 MG PO TBEC
81.0000 mg | DELAYED_RELEASE_TABLET | Freq: Every day | ORAL | Status: DC
  Administered 2016-10-09: 81 mg via ORAL
  Filled 2016-10-08 (×2): qty 1

## 2016-10-08 MED ORDER — ALBUTEROL SULFATE (2.5 MG/3ML) 0.083% IN NEBU: 2.5000 mg | INHALATION_SOLUTION | Freq: Once | RESPIRATORY_TRACT | Status: DC

## 2016-10-08 MED ORDER — IPRATROPIUM-ALBUTEROL 0.5-2.5 (3) MG/3ML IN SOLN: 3.0000 mL | Freq: Once | RESPIRATORY_TRACT | Status: DC

## 2016-10-08 MED ORDER — SODIUM CHLORIDE 0.9 % IV BOLUS (SEPSIS): 500.0000 mL | INTRAVENOUS | Status: DC | PRN

## 2016-10-08 MED ORDER — HEPARIN SODIUM (PORCINE) 5000 UNIT/ML IJ SOLN
5000.0000 [IU] | Freq: Three times a day (TID) | INTRAMUSCULAR | Status: DC
  Administered 2016-10-08 – 2016-10-10 (×5): 5000 [IU] via SUBCUTANEOUS
  Filled 2016-10-08 (×5): qty 1

## 2016-10-08 MED ORDER — SODIUM CHLORIDE 0.9 % IV BOLUS (SEPSIS)
500.0000 mL | Freq: Once | INTRAVENOUS | Status: AC
  Administered 2016-10-08: 500 mL via INTRAVENOUS

## 2016-10-08 MED ORDER — PIPERACILLIN-TAZOBACTAM 3.375 G IVPB 30 MIN
3.3750 g | Freq: Once | INTRAVENOUS | Status: AC
  Administered 2016-10-08: 3.375 g via INTRAVENOUS
  Filled 2016-10-08: qty 50

## 2016-10-08 MED ORDER — HYDROCORTISONE NA SUCCINATE PF 100 MG IJ SOLR
50.0000 mg | Freq: Two times a day (BID) | INTRAMUSCULAR | Status: DC
  Administered 2016-10-09 – 2016-10-10 (×3): 50 mg via INTRAVENOUS
  Filled 2016-10-08 (×4): qty 2

## 2016-10-08 MED ORDER — CALCIUM CARBONATE-VITAMIN D 500-200 MG-UNIT PO TABS
1.0000 | ORAL_TABLET | Freq: Every day | ORAL | Status: DC
  Filled 2016-10-08 (×3): qty 1

## 2016-10-08 MED ORDER — TRANDOLAPRIL 2 MG PO TABS
2.0000 mg | ORAL_TABLET | Freq: Every day | ORAL | Status: DC
  Administered 2016-10-09: 2 mg via ORAL
  Filled 2016-10-08 (×3): qty 1

## 2016-10-08 MED ORDER — SODIUM CHLORIDE 0.9 % IV SOLN
INTRAVENOUS | Status: DC
  Administered 2016-10-08: 18:00:00 via INTRAVENOUS

## 2016-10-08 MED ORDER — FLECAINIDE ACETATE 100 MG PO TABS
50.0000 mg | ORAL_TABLET | Freq: Every day | ORAL | Status: DC
  Administered 2016-10-09: 50 mg via ORAL
  Filled 2016-10-08 (×5): qty 0.5

## 2016-10-08 MED ORDER — LEVOTHYROXINE SODIUM 112 MCG PO TABS
112.0000 ug | ORAL_TABLET | Freq: Every day | ORAL | Status: DC
  Administered 2016-10-09 – 2016-10-10 (×2): 112 ug via ORAL
  Filled 2016-10-08 (×4): qty 1

## 2016-10-08 MED ORDER — SPIRONOLACTONE 25 MG PO TABS
25.0000 mg | ORAL_TABLET | Freq: Every day | ORAL | Status: DC
  Administered 2016-10-09: 25 mg via ORAL
  Filled 2016-10-08 (×3): qty 1

## 2016-10-08 MED ORDER — VANCOMYCIN HCL IN DEXTROSE 1-5 GM/200ML-% IV SOLN
1000.0000 mg | Freq: Once | INTRAVENOUS | Status: AC
  Administered 2016-10-08: 1000 mg via INTRAVENOUS
  Filled 2016-10-08: qty 200

## 2016-10-08 NOTE — ED Triage Notes (Addendum)
Pt c/o SOB, labored breathing, cyanosis noted to fingertips, wheezing heard, and weak, non-productive cough noted. Started 30 minutes ago. Pt was dx with a fx sternum.

## 2016-10-08 NOTE — Progress Notes (Signed)
Pharmacy Antibiotic Note  [name] is a 75 y.o. male admitted on 10/08/2016 with pneumonia.  Pharmacy has been consulted for Unasyn and Azithromycin dosing.  Plan: Azithromycin 500 mg IV every 24 hours Unasyn 3 GM IV every 8 hours Labs per protocol     Temp (24hrs), Avg:99.7 F (37.6 C), Min:99.7 F (37.6 C), Max:99.7 F (37.6 C)   Recent Labs Lab 10/05/16 1717 10/08/16 1340 10/08/16 1347 10/08/16 1348  WBC 8.1  --   --  14.1*  CREATININE 1.28* 1.10  --  1.13  LATICACIDVEN  --   --  2.80*  --     CrCl cannot be calculated (Unknown ideal weight.).    No Known Allergies  Antimicrobials this admission: Azithromycin  12/23 >>  Unasyn 12/23 >>  Vancomycin 12/23 x1  ED Zosyn            12/23 x1  ED      Thank you for allowing pharmacy to be a part of this patient's care.  Chriss Czar 10/08/2016 2:51 PM

## 2016-10-08 NOTE — ED Provider Notes (Signed)
[facility] DEPT Provider Note   CSN: [id number] Arrival date & time: 10/08/16  1305  By signing my name below, I, [name], attest that this documentation has been prepared under the direction and in the presence of [name] Ferguson, MD. Electronically Signed: Soijett Andrews, ED Scribe. 10/08/16. 1:28 PM.  History   Chief Complaint Chief Complaint  Patient presents with  . Shortness of Breath    HPI [name] is a 75 y.o. male with a PMHx of COPD, CAD, PE, who presents to the Emergency Department complaining of progressively worsening SOB onset 30 minutes ago PTA. Wife notes that the pt was seen in the ED 3 days ago and diagnosed with a sternal fracture. Pt is having associated symptoms of mild productive cough. He has not tried any medications for the relief of his symptoms. He denies fever, chills, and any other symptoms.     The history is provided by the patient and the spouse. No language interpreter was used.  Shortness of Breath  This is a recurrent problem. The average episode lasts 30 minutes. The current episode started less than 1 hour ago. The problem has been gradually worsening. Associated symptoms include cough. Pertinent negatives include no fever, no headaches, no chest pain, no abdominal pain and no rash. He has tried nothing for the symptoms. The treatment provided no relief. Associated medical issues include COPD, PE and CAD.    Past Medical History:  Diagnosis Date  . AICD (automatic cardioverter/defibrillator) present   . Aortic insufficiency     moderate aortic ins moderate/asymptomatic/normal LV cavity size.   . Carotid artery disease ([place])     less than 50% stenosis bilaterally  . Drug-induced gynecomastia     secondary to spironolactone  . Dyslipidemia   . ICD (implantable cardiac defibrillator), biventricular, in situ     Medtronic model  [id number]  . Ischemic cardiomyopathy     status post non-ST elevation microinfarction stent to the right  coronary artery is a   . LV dysfunction     NYHA class I/prior ejection fraction 20-25% and an ejection fraction 7 2009 50-55%, ejection fraction 60% bedside echocardiogram July 2012   . PONV (postoperative nausea and vomiting)   . Pulmonary embolism (HCC)    Prior history of pulmonary embolism off Coumadin.  . Ventricular tachycardia ([place])    Inducible ventricular polymorphic tachycardia status post ICD followed by upgrade with CRT-D 2007, battery change at 01/31/2011    Patient Active Problem List   Diagnosis Date Noted  . Pulmonary hypertension 12/14/2015  . Coronary artery disease involving native coronary artery without angina pectoris 12/14/2015  . Laryngopharyngeal reflux (LPR) 12/14/2015  . Abnormal nuclear stress test 12/10/2015  . Dyspnea 12/10/2015  . Atrial tachycardia ([place]) 10/06/2015  . Chronic systolic heart failure ([place]) 05/22/2012  . Ischemic cardiomyopathy   . LV dysfunction   . Ventricular tachycardia ([place])   . Drug-induced gynecomastia   . Carotid artery disease ([place])   . Aortic insufficiency   . Dyslipidemia   . Pulmonary embolism ([place])   . Biventricular implantable cardioverter-defibrillator in situ   . DYSLIPIDEMIA 08/23/2010  . CAROTID BRUIT 08/23/2010  . Centrilobular emphysema ([place]) 04/10/2009  . NEPHROLITHIASIS 04/10/2009  . RENAL CALCULUS, HX OF 04/10/2009    Past Surgical History:  Procedure Laterality Date  . CARDIAC CATHETERIZATION    . CARDIAC CATHETERIZATION N/A 12/10/2015   Procedure: Right/Left Heart Cath and Coronary Angiography;  Surgeon: Peter M Martinique, MD;  Location: [facility] INVASIVE CV  LAB;  Service: Cardiovascular;  Laterality: N/A;  . CARDIAC DEFIBRILLATOR PLACEMENT     medtronic, remote - yes  . CATARACT EXTRACTION W/PHACO Left 08/25/2014   Procedure: CATARACT EXTRACTION PHACO AND INTRAOCULAR LENS PLACEMENT (IOC);  Surgeon: Tonny Branch, MD;  Location: [facility] ORS;  Service: Ophthalmology;  Laterality: Left;  CDE 5.79  . CATARACT EXTRACTION  W/PHACO Right 09/18/2014   Procedure: CATARACT EXTRACTION PHACO AND INTRAOCULAR LENS PLACEMENT RIGHT EYE;  Surgeon: Tonny Branch, MD;  Location: [facility] ORS;  Service: Ophthalmology;  Laterality: Right;  CDE:3.35  . CORONARY STENT PLACEMENT    . EP IMPLANTABLE DEVICE N/A 05/03/2016   Procedure: BIV PPM Generator Changeout;  Surgeon: Evans Lance, MD;  Location: [place] CV LAB;  Service: Cardiovascular;  Laterality: N/A;  . INGUINAL HERNIA REPAIR    . left breast biopsy    . RETROPUBIC PROSTATECTOMY    . THORACOTOMY     left  . THYROIDECTOMY         Home Medications    Prior to Admission medications   Medication Sig Start Date End Date Taking? Authorizing Provider  aspirin 81 MG tablet Take 81 mg by mouth daily.     Historical Provider, MD  calcium-vitamin D (OSCAL WITH D) 500-200 MG-UNIT tablet Take 1 tablet by mouth daily.    Historical Provider, MD  carvedilol (COREG) 25 MG tablet Take .5 (12.5 mg total) by mouth 2 (two) times daily with a meal. 07/26/16   Amber Sena Slate, NP  eplerenone (INSPRA) 25 MG tablet Take 1 tablet (25 mg total) by mouth daily. 10/25/11   Ezra Sites, MD  flecainide (TAMBOCOR) 50 MG tablet Take 1 tablet (50 mg) daily. May take additional tablet (50 mg) if heart is racing. 09/07/16   Evans Lance, MD  fluticasone furoate-vilanterol (BREO ELLIPTA) 100-25 MCG/INH AEPB Inhale 1 puff into the lungs daily. 02/04/16   Noralee Space, MD  furosemide (LASIX) 20 MG tablet Take 3 tablets (60 mg total) by mouth daily. Patient taking differently: No sig reported 01/11/16   Evans Lance, MD  HYDROcodone-acetaminophen (NORCO/VICODIN) 5-325 MG tablet Take 1 tablet by mouth every 4 (four) hours as needed. 10/05/16   Tanna Furry, MD  Hydrocortisone-Aloe Vera (CORTIZONE-10 PLUS EX) Apply 1 application topically as directed. Use as needed    Historical Provider, MD  levothyroxine (SYNTHROID, LEVOTHROID) 112 MCG tablet Take 112 mcg by mouth daily before breakfast.    Historical  Provider, MD  Multiple Vitamins-Minerals (MULTIVITAMIN WITH MINERALS) tablet Take 1 tablet by mouth daily.    Historical Provider, MD  niacin 500 MG tablet Take 500 mg by mouth daily with breakfast.      Historical Provider, MD  omeprazole (PRILOSEC) 40 MG capsule Take 40 mg by mouth daily.    Historical Provider, MD  ondansetron (ZOFRAN ODT) 4 MG disintegrating tablet Take 1 tablet (4 mg total) by mouth every 8 (eight) hours as needed for nausea. 10/05/16   Tanna Furry, MD  simvastatin (ZOCOR) 20 MG tablet Take 1 tablet (20 mg total) by mouth at bedtime. 10/25/11   Ezra Sites, MD  trandolapril (MAVIK) 2 MG tablet Take 3 mg by mouth daily. 11/27/15   Historical Provider, MD    Family History Family History  Problem Relation Age of Onset  . Diabetes type I Sister     daughter   . Lung cancer Brother     several types of cancer  . Lung cancer Brother  several types of cancer  . Lung cancer Brother   . Clotting disorder Neg Hx     also no repiratory disease   . Prostate cancer Neg Hx     Social History Social History  Substance Use Topics  . Smoking status: Former Smoker    Packs/day: 1.00    Years: 43.00    Types: Cigarettes    Start date: 10/17/1957    Quit date: 12/13/2000  . Smokeless tobacco: Never Used     Comment: started at age 30  . Alcohol use No     Allergies   Patient has no known allergies.   Review of Systems Review of Systems  Constitutional: Negative for appetite change, fatigue and fever.  HENT: Negative for congestion, ear discharge and sinus pressure.   Eyes: Negative for discharge.  Respiratory: Positive for cough and shortness of breath.   Cardiovascular: Negative for chest pain.  Gastrointestinal: Negative for abdominal pain and diarrhea.  Genitourinary: Negative for frequency and hematuria.  Musculoskeletal: Negative for back pain.  Skin: Negative for rash.  Neurological: Negative for seizures and headaches.  Psychiatric/Behavioral: Negative  for hallucinations.     Physical Exam Updated Vital Signs BP (!) 201/83 (BP Location: Left Arm)   Pulse 100   Temp 99.7 F (37.6 C) (Oral)   Resp (!) 40   SpO2 97%   Physical Exam  Constitutional: He is oriented to person, place, and time. He appears well-developed.  HENT:  Head: Normocephalic.  Eyes: Conjunctivae and EOM are normal. No scleral icterus.  Neck: Neck supple. No thyromegaly present.  Cardiovascular: Normal rate and regular rhythm.  Exam reveals no gallop and no friction rub.   No murmur heard. Pulmonary/Chest: No stridor. He has no wheezes. He has no rales. He exhibits no tenderness.  Crackles bilaterally to lungs  Abdominal: He exhibits no distension. There is no tenderness. There is no rebound.  Musculoskeletal: Normal range of motion. He exhibits no edema.  Lymphadenopathy:    He has no cervical adenopathy.  Neurological: He is oriented to person, place, and time. He exhibits normal muscle tone. Coordination normal.  Skin: No rash noted. No erythema.  Psychiatric: He has a normal mood and affect. His behavior is normal.  Nursing note and vitals reviewed.    ED Treatments / Results  DIAGNOSTIC STUDIES: Oxygen Saturation is 97% on [place], nl by my interpretation.    COORDINATION OF CARE: 1:23 PM Discussed treatment plan with pt at bedside which includes breathing treatment, KEG, CXR, and pt agreed to plan.   Labs (all labs ordered are listed, but only abnormal results are displayed) Labs Reviewed - No data to display  EKG  EKG Interpretation None       Radiology No results found.  Procedures Procedures (including critical care time)  Medications Ordered in ED Medications  albuterol (PROVENTIL) (2.5 MG/3ML) 0.083% nebulizer solution 5 mg (not administered)     Initial Impression / Assessment and Plan / ED Course  I have reviewed the triage vital signs and the nursing notes.  Pertinent labs & imaging results that were available during my  care of the patient were reviewed by me and considered in my medical decision making (see chart for details).  Clinical Course     Pneumonia.  admit Final Clinical Impressions(s) / ED Diagnoses   Final diagnoses:  None    New Prescriptions New Prescriptions   No medications on file   The chart was scribed for me under my direct  supervision.  I personally performed the history, physical, and medical decision making and all procedures in the evaluation of this patient.[name] Ferguson, MD 10/08/16 [phone]

## 2016-10-08 NOTE — ED Notes (Signed)
Respiratory at bedside, pt having retractions, 100% on NRB and switched to [place] 4L at this time 97%.

## 2016-10-08 NOTE — H&P (Signed)
[facility] H&P   Patient Demographics:    [name], is a 75 y.o. male  MRN: [id number]   DOB - [birth year]  Admit Date - 10/08/2016  Outpatient Primary MD for the patient is Glenda Chroman, MD  Patient coming from: Home  Chief Complaint  Patient presents with  . Shortness of Breath      HPI:    [name]  is a 75 y.o. male, With ischemic cardiomyopathy with V. tach status post AICD placement, chronic diastolic heart failure however EF now 55% on latest echocardiogram, CAD, dyslipidemia, COPD, remote history of smoking, who stays at home and had her accident where he hurt his chest wall while using a drill and a ladder, this happened a few days ago he came to the ER was diagnosed with musculoskeletal rib cage injury and sent home. He now comes back with gradually progressive shortness of breath, productive cough and low-grade subjective fevers. In the ER he was diagnosed with pneumonia, possible early sepsis and I was called to admit.  Besides shortness of breath, pleuritic chest pain on taking deep breaths, productive cough and subjective fevers his review of systems is negative, he did get flu shot this year, denies any sick contacts along driver travel, after breathing treatment he feels a whole lot better.    Review of systems:    In addition to the HPI above, No Fever-chills, No Headache, No changes with Vision or hearing, No problems swallowing food or Liquids,  + Chest pain + Cough & Shortness of Breath, No Abdominal pain, No Nausea or Vommitting, Bowel movements are regular, No Blood in stool or Urine, No dysuria, No new skin rashes or bruises, No new joints pains-aches,  No new weakness, tingling,  numbness in any extremity, No recent weight gain or loss, No polyuria, polydypsia or polyphagia, No significant Mental Stressors.  A full 10 point Review of Systems was done, except as stated above, all other Review of Systems were negative.   With Past History of the following :    Past Medical History:  Diagnosis Date  . AICD (automatic cardioverter/defibrillator) present   . Aortic insufficiency     moderate aortic ins moderate/asymptomatic/normal LV cavity size.   . Carotid artery disease ([place])     less than 50% stenosis bilaterally  . Drug-induced gynecomastia  secondary to spironolactone  . Dyslipidemia   . ICD (implantable cardiac defibrillator), biventricular, in situ     Medtronic model  [id number]  . Ischemic cardiomyopathy     status post non-ST elevation microinfarction stent to the right coronary artery is a   . LV dysfunction     NYHA class I/prior ejection fraction 20-25% and an ejection fraction 7 2009 50-55%, ejection fraction 60% bedside echocardiogram July 2012   . PONV (postoperative nausea and vomiting)   . Pulmonary embolism (HCC)    Prior history of pulmonary embolism off Coumadin.  . Ventricular tachycardia ([place])    Inducible ventricular polymorphic tachycardia status post ICD followed by upgrade with CRT-D 2007, battery change at 01/31/2011      Past Surgical History:  Procedure Laterality Date  . CARDIAC CATHETERIZATION    . CARDIAC CATHETERIZATION N/A 12/10/2015   Procedure: Right/Left Heart Cath and Coronary Angiography;  Surgeon: Peter M Martinique, MD;  Location: [facility] CV LAB;  Service: Cardiovascular;  Laterality: N/A;  . CARDIAC DEFIBRILLATOR PLACEMENT     medtronic, remote - yes  . CATARACT EXTRACTION W/PHACO Left 08/25/2014   Procedure: CATARACT EXTRACTION PHACO AND INTRAOCULAR LENS PLACEMENT (IOC);  Surgeon: Tonny Branch, MD;  Location: [facility] ORS;  Service: Ophthalmology;  Laterality: Left;  CDE 5.79  . CATARACT EXTRACTION W/PHACO Right  09/18/2014   Procedure: CATARACT EXTRACTION PHACO AND INTRAOCULAR LENS PLACEMENT RIGHT EYE;  Surgeon: Tonny Branch, MD;  Location: [facility] ORS;  Service: Ophthalmology;  Laterality: Right;  CDE:3.35  . CORONARY STENT PLACEMENT    . EP IMPLANTABLE DEVICE N/A 05/03/2016   Procedure: BIV PPM Generator Changeout;  Surgeon: Evans Lance, MD;  Location: [facility] CV LAB;  Service: Cardiovascular;  Laterality: N/A;  . INGUINAL HERNIA REPAIR    . left breast biopsy    . RETROPUBIC PROSTATECTOMY    . THORACOTOMY     left  . THYROIDECTOMY        Social History:     Social History  Substance Use Topics  . Smoking status: Former Smoker    Packs/day: 1.00    Years: 43.00    Types: Cigarettes    Start date: 10/17/1957    Quit date: 12/13/2000  . Smokeless tobacco: Never Used     Comment: started at age 41  . Alcohol use No         Family History :     Family History  Problem Relation Age of Onset  . Diabetes type I Sister     daughter   . Lung cancer Brother     several types of cancer  . Lung cancer Brother     several types of cancer  . Lung cancer Brother   . Clotting disorder Neg Hx     also no repiratory disease   . Prostate cancer Neg Hx       Home Medications:   Prior to Admission medications   Medication Sig Start Date End Date Taking? Authorizing Provider  aspirin 81 MG tablet Take 81 mg by mouth daily.     Historical Provider, MD  calcium-vitamin D (OSCAL WITH D) 500-200 MG-UNIT tablet Take 1 tablet by mouth daily.    Historical Provider, MD  carvedilol (COREG) 25 MG tablet Take .5 (12.5 mg total) by mouth 2 (two) times daily with a meal. 07/26/16   Amber Sena Slate, NP  eplerenone (INSPRA) 25 MG tablet Take 1 tablet (25 mg total) by mouth daily. 10/25/11   Luvenia Heller  Denna Haggard, MD  flecainide (TAMBOCOR) 50 MG tablet Take 1 tablet (50 mg) daily. May take additional tablet (50 mg) if heart is racing. 09/07/16   Evans Lance, MD  fluticasone furoate-vilanterol (BREO ELLIPTA) 100-25  MCG/INH AEPB Inhale 1 puff into the lungs daily. 02/04/16   Noralee Space, MD  furosemide (LASIX) 20 MG tablet Take 3 tablets (60 mg total) by mouth daily. Patient taking differently: No sig reported 01/11/16   Evans Lance, MD  HYDROcodone-acetaminophen (NORCO/VICODIN) 5-325 MG tablet Take 1 tablet by mouth every 4 (four) hours as needed. 10/05/16   Tanna Furry, MD  Hydrocortisone-Aloe Vera (CORTIZONE-10 PLUS EX) Apply 1 application topically as directed. Use as needed    Historical Provider, MD  levothyroxine (SYNTHROID, LEVOTHROID) 112 MCG tablet Take 112 mcg by mouth daily before breakfast.    Historical Provider, MD  Multiple Vitamins-Minerals (MULTIVITAMIN WITH MINERALS) tablet Take 1 tablet by mouth daily.    Historical Provider, MD  niacin 500 MG tablet Take 500 mg by mouth daily with breakfast.      Historical Provider, MD  omeprazole (PRILOSEC) 40 MG capsule Take 40 mg by mouth daily.    Historical Provider, MD  ondansetron (ZOFRAN ODT) 4 MG disintegrating tablet Take 1 tablet (4 mg total) by mouth every 8 (eight) hours as needed for nausea. 10/05/16   Tanna Furry, MD  predniSONE (DELTASONE) 10 MG tablet Take 20 mg by mouth daily. 09/21/16   Historical Provider, MD  simvastatin (ZOCOR) 20 MG tablet Take 1 tablet (20 mg total) by mouth at bedtime. 10/25/11   Ezra Sites, MD  trandolapril (MAVIK) 2 MG tablet Take 3 mg by mouth daily. 11/27/15   Historical Provider, MD     Allergies:    No Known Allergies   Physical Exam:   Vitals  Blood pressure 106/58, pulse 96, temperature 99.7 F (37.6 C), temperature source Oral, resp. rate 24, SpO2 95 %.   1. General Frail elderly white male lying in bed in NAD,     2. Normal affect and insight, Not Suicidal or Homicidal, Awake Alert, Oriented X 3.  3. No F.N deficits, ALL C.Nerves Intact, Strength 5/5 all 4 extremities, Sensation intact all 4 extremities, Plantars down going.  4. Ears and Eyes appear Normal, Conjunctivae clear, PERRLA.  Moist Oral Mucosa.  5. Supple Neck, No JVD, No cervical lymphadenopathy appriciated, No Carotid Bruits.  6. Symmetrical Chest wall movement, Good air movement bilaterally, coarse right-sided breath sounds. Mild tenderness to palpate in the sternal area  7. RRR, No Gallops, Rubs or Murmurs, No Parasternal Heave.  8. Positive Bowel Sounds, Abdomen Soft, No tenderness, No organomegaly appriciated,No rebound -guarding or rigidity.  9.  No Cyanosis, Normal Skin Turgor, No Skin Rash or Bruise.  10. Good muscle tone,  joints appear normal , no effusions, Normal ROM.  11. No Palpable Lymph Nodes in Neck or Axillae      Data Review:    CBC  Recent Labs Lab 10/05/16 1717 10/08/16 1340 10/08/16 1348  WBC 8.1  --  14.1*  HGB 14.0 15.6 15.2  HCT 41.7 46.0 46.7  PLT 179  --  172  MCV 89.9  --  92.1  MCH 30.2  --  30.0  MCHC 33.6  --  32.5  RDW 14.0  --  14.1  LYMPHSABS  --   --  0.9  MONOABS  --   --  0.4  EOSABS  --   --  0.0  BASOSABS  --   --  0.0   ------------------------------------------------------------------------------------------------------------------  Chemistries   Recent Labs Lab 10/05/16 1717 10/08/16 1340 10/08/16 1348  NA 140 140 140  K 3.8 4.3 4.0  CL 102 98* 99*  CO2 29  --  31  GLUCOSE 134* 127* 131*  BUN 30* 31* 25*  CREATININE 1.28* 1.10 1.13  CALCIUM 9.3  --  9.2  AST  --   --  23  ALT  --   --  9*  ALKPHOS  --   --  64  BILITOT  --   --  1.4*   ------------------------------------------------------------------------------------------------------------------ CrCl cannot be calculated (Unknown ideal weight.). ------------------------------------------------------------------------------------------------------------------ No results for input(s): TSH, T4TOTAL, T3FREE, THYROIDAB in the last 72 hours.  Invalid input(s): FREET3  Coagulation profile No results for input(s): INR, PROTIME in the last 168  hours. ------------------------------------------------------------------------------------------------------------------- No results for input(s): DDIMER in the last 72 hours. -------------------------------------------------------------------------------------------------------------------  Cardiac Enzymes No results for input(s): CKMB, TROPONINI, MYOGLOBIN in the last 168 hours.  Invalid input(s): CK ------------------------------------------------------------------------------------------------------------------ No results found for: BNP   ---------------------------------------------------------------------------------------------------------------  Urinalysis No results found for: COLORURINE, APPEARANCEUR, [place], [place], GLUCOSEU, [place], [place], [place], PROTEINUR, UROBILINOGEN, NITRITE, LEUKOCYTESUR  ----------------------------------------------------------------------------------------------------------------   Imaging Results:    Dg Chest Portable 1 View  Result Date: 10/08/2016 CLINICAL DATA:  Shortness of breath, labored breathing, sinuses, wheezing EXAM: PORTABLE CHEST 1 VIEW COMPARISON:  10/05/2016 FINDINGS: Patchy right lower lobe opacity, new, suspicious for pneumonia. Left lung is clear. No pleural effusion or pneumothorax. Cardiomegaly.  Left subclavian ICD. IMPRESSION: Patchy right lower lobe opacity, new, suspicious for pneumonia. Electronically Signed   By: Julian Hy M.D.   On: 10/08/2016 13:43    My personal review of EKG: Paced rhythm   Assessment & Plan:     1. Early sepsis due to community-acquired pneumonia. He is in the setting of substernal injury which he incurred few days ago, blood and sputum cultures, he appears nontoxic and lactic acid is borderline, IV fluid 1 L bolus thereafter maintenance along with when necessary IV fluid bolus if needed, IV antibiotics which will be azithromycin and Unasyn, added incentive spirometer as I think  atelectasis is playing a role in his pneumonia. Counseled on IS use. Follow cultures.  2. Chronic ischemic cardiomyopathy status post V. tach/AICD placement. Currently stable. Continue flecainide, resume half home dose Coreg from this evening after blood pressure stabilizes. Hold diuretics today. He is currently compensated.  3. Running diastolic CHF EF now improved to 55-60% on recent echocardiogram. Compensated.  4. COPD. No wheezing, supportive care with nebulizer treatments and oxygen as needed.  5. Hypothyroidism. Continue home dose Synthroid.  6. GERD. On PPI.   DVT Prophylaxis Heparin   AM Labs Ordered, also please review Full Orders  Family Communication: Admission, patients condition and plan of care including tests being ordered have been discussed with the patient and family who indicate understanding and agree with the plan and Code Status.  Code Status Full  Likely DC to  Home 2-3 days  Condition GUARDED    Consults called: None    Admission status: Inpt    Time spent in minutes : 35   Malaki Koury K M.D on 10/08/2016 at 2:34 PM  Between 7am to 7pm - Pager - [phone]. After 7pm go to [email or web address] - password [email or web address]  [facility] - Office  [phone]

## 2016-10-08 NOTE — ED Notes (Signed)
Pt stable and ready for transport to ICU-03 stepdown. Report given to [facility] ICU, RN.

## 2016-10-09 ENCOUNTER — Inpatient Hospital Stay (HOSPITAL_COMMUNITY): Payer: Medicare Other

## 2016-10-09 LAB — EXPECTORATED SPUTUM ASSESSMENT W REFEX TO RESP CULTURE

## 2016-10-09 LAB — BASIC METABOLIC PANEL
ANION GAP: 5 (ref 5–15)
BUN: 22 mg/dL — ABNORMAL HIGH (ref 6–20)
CALCIUM: 8.4 mg/dL — AB (ref 8.9–10.3)
CHLORIDE: 102 mmol/L (ref 101–111)
CO2: 33 mmol/L — AB (ref 22–32)
CREATININE: 1.03 mg/dL (ref 0.61–1.24)
GFR calc non Af Amer: >60 mL/min (ref >60)
Glucose, Bld: 105 mg/dL — ABNORMAL HIGH (ref 65–99)
Potassium: 4.2 mmol/L (ref 3.5–5.1)
SODIUM: 140 mmol/L (ref 135–145)

## 2016-10-09 LAB — CBC
HEMATOCRIT: 38.8 % — AB (ref 39.0–52.0)
HEMOGLOBIN: 12.7 g/dL — AB (ref 13.0–17.0)
MCH: 30.5 pg (ref 26.0–34.0)
MCHC: 32.7 g/dL (ref 30.0–36.0)
MCV: 93 fL (ref 78.0–100.0)
Platelets: 143 10*3/uL — ABNORMAL LOW (ref 150–400)
RBC: 4.17 MIL/uL — ABNORMAL LOW (ref 4.22–5.81)
RDW: 14.4 % (ref 11.5–15.5)
WBC: 14.5 10*3/uL — AB (ref 4.0–10.5)

## 2016-10-09 LAB — LACTIC ACID, PLASMA: LACTIC ACID, VENOUS: 1.1 mmol/L (ref 0.5–1.9)

## 2016-10-09 LAB — MRSA PCR SCREENING: MRSA BY PCR: NEGATIVE

## 2016-10-09 MED ORDER — SODIUM CHLORIDE 0.9 % IV SOLN
INTRAVENOUS | Status: AC
  Filled 2016-10-09: qty 3

## 2016-10-09 MED ORDER — FUROSEMIDE 10 MG/ML IJ SOLN
40.0000 mg | Freq: Every day | INTRAMUSCULAR | Status: DC
  Administered 2016-10-09: 40 mg via INTRAVENOUS
  Filled 2016-10-09: qty 4

## 2016-10-09 NOTE — Progress Notes (Signed)
PROGRESS NOTE                                                                                                                                                                                                             Patient Demographics:    [name], is a 75 y.o. male, DOB - 08/14/1941, ZC:[id number]  Admit date - 10/08/2016   Admitting Physician Thurnell Lose, MD  Outpatient Primary MD for the patient is Glenda Chroman, MD  LOS - 1  Chief Complaint  Patient presents with  . Shortness of Breath       Brief Narrative     [name]  is a 75 y.o. male, With ischemic cardiomyopathy with V. tach status post AICD placement, chronic diastolic heart failure however EF now 55% on latest echocardiogram, CAD, dyslipidemia, COPD, remote history of smoking, who stays at home and had her accident where he hurt his chest wall while using a drill and a ladder, this happened a few days ago he came to the ER was diagnosed with musculoskeletal rib cage injury and sent home. He now comes back with gradually progressive shortness of breath, productive cough and low-grade subjective fevers. In the ER he was diagnosed with pneumonia, possible early sepsis and I was called to admit.  Besides shortness of breath, pleuritic chest pain on taking deep breaths, productive cough and subjective fevers his review of systems is negative, he did get flu shot this year, denies any sick contacts along driver travel, after breathing treatment he feels a whole lot better.   Subjective:    [name] today has, No headache, No chest pain, No abdominal pain - No Nausea, No new weakness tingling or numbness, Improved Cough - SOB.     Assessment  & Plan :     1. Early sepsis due to community-acquired pneumonia. He is in the setting of substernal injury which he incurred few days ago - With empiric antibiotics is much improved, sepsis physiology  has completely resolved, continue azithromycin and Unasyn, follow cultures, continue use of incentive spirometer due to the possibility of atelectasis contradicting 2 pneumonia. Increase activity, try to titrate off oxygen likely discharge in the morning.  2. Chronic ischemic cardiomyopathy status post V. tach/AICD placement. Currently stable. Continue flecainide, resume half home dose Coreg from this evening after blood  pressure stabilizes. Hold diuretics today. He is currently compensated.  3. Chronic diastolic CHF EF now improved to 55-60% on recent echocardiogram. Compensated.   4. COPD. No wheezing, supportive care with nebulizer treatments and oxygen as needed.  5. Hypothyroidism. Continue home dose Synthroid.  6. GERD. On PPI.    Family Communication  :  Wife  Code Status :  Full  Diet : Diet Heart Room service appropriate? Yes; Fluid consistency: Thin; Fluid restriction: 1500 mL Fluid   Disposition Plan  :  Tele  Consults  :  None  Procedures  :    DVT Prophylaxis  :  Heparin    Lab Results  Component Value Date   PLT 143 (L) 10/09/2016    Inpatient Medications  Scheduled Meds: . ampicillin-sulbactam (UNASYN) IV  3 g Intravenous Q8H  . aspirin EC  81 mg Oral Daily  . azithromycin  500 mg Intravenous Q24H  . calcium-vitamin D  1 tablet Oral Daily  . carvedilol  12.5 mg Oral BID WC  . flecainide  50 mg Oral Daily  . fluticasone furoate-vilanterol  1 puff Inhalation Daily  . heparin subcutaneous  5,000 Units Subcutaneous Q8H  . hydrocortisone sod succinate (SOLU-CORTEF) inj  50 mg Intravenous Q12H  . levothyroxine  112 mcg Oral QAC breakfast  . pantoprazole  40 mg Oral Daily  . simvastatin  20 mg Oral QHS  . spironolactone  25 mg Oral Daily  . trandolapril  2 mg Oral Daily   Continuous Infusions: . sodium chloride 75 mL/hr at 10/09/16 0900   PRN Meds:.acetaminophen, albuterol, bisacodyl, HYDROcodone-acetaminophen, ondansetron (ZOFRAN) IV, sodium  chloride  Antibiotics  :    Anti-infectives    Start     Dose/Rate Route Frequency Ordered Stop   10/09/16 1700  azithromycin (ZITHROMAX) 500 mg in dextrose 5 % 250 mL IVPB     500 mg 250 mL/hr over 60 Minutes Intravenous Every 24 hours 10/08/16 1652     10/08/16 1800  Ampicillin-Sulbactam (UNASYN) 3 g in sodium chloride 0.9 % 100 mL IVPB     3 g 200 mL/hr over 30 Minutes Intravenous Every 8 hours 10/08/16 1652     10/08/16 1700  azithromycin (ZITHROMAX) 500 mg in dextrose 5 % 250 mL IVPB     500 mg 250 mL/hr over 60 Minutes Intravenous  Once 10/08/16 1652 10/08/16 1915   10/08/16 1415  vancomycin (VANCOCIN) IVPB 1000 mg/200 mL premix     1,000 mg 200 mL/hr over 60 Minutes Intravenous  Once 10/08/16 1402 10/08/16 1621   10/08/16 1415  piperacillin-tazobactam (ZOSYN) IVPB 3.375 g     3.375 g 100 mL/hr over 30 Minutes Intravenous  Once 10/08/16 1402 10/08/16 1621         Objective:   Vitals:   10/09/16 0700 10/09/16 0730 10/09/16 0800 10/09/16 0900  BP: 136/65  (!) 147/72 120/64  Pulse: 62  69 69  Resp: 18  17 (!) 21  Temp:  98.4 F (36.9 C)    TempSrc:  Oral    SpO2: 97%  94% 93%  Weight:      Height:        Wt Readings from Last 3 Encounters:  10/09/16 61.4 kg (135 lb 5.8 oz)  08/19/16 61.6 kg (135 lb 12.8 oz)  08/03/16 65.5 kg (144 lb 8 oz)     Intake/Output Summary (Last 24 hours) at 10/09/16 0910 Last data filed at 10/09/16 0900  Gross per 24 hour  Intake  1871.25 ml  Output              865 ml  Net          1006.25 ml     Physical Exam  Awake Alert, Oriented X 3, No new F.N deficits, Normal affect [employer].AT,PERRAL Supple Neck,No JVD, No cervical lymphadenopathy appriciated.  Symmetrical Chest wall movement, Good air movement bilaterally, R>L rales RRR,No Gallops,Rubs or new Murmurs, No Parasternal Heave +ve B.Sounds, Abd Soft, No tenderness, No organomegaly appriciated, No rebound - guarding or rigidity. No Cyanosis, Clubbing or edema, No new  Rash or bruise       Data Review:    CBC  Recent Labs Lab 10/05/16 1717 10/08/16 1340 10/08/16 1348 10/09/16 0524  WBC 8.1  --  14.1* 14.5*  HGB 14.0 15.6 15.2 12.7*  HCT 41.7 46.0 46.7 38.8*  PLT 179  --  172 143*  MCV 89.9  --  92.1 93.0  MCH 30.2  --  30.0 30.5  MCHC 33.6  --  32.5 32.7  RDW 14.0  --  14.1 14.4  LYMPHSABS  --   --  0.9  --   MONOABS  --   --  0.4  --   EOSABS  --   --  0.0  --   BASOSABS  --   --  0.0  --     Chemistries   Recent Labs Lab 10/05/16 1717 10/08/16 1340 10/08/16 1348 10/09/16 0524  NA 140 140 140 140  K 3.8 4.3 4.0 4.2  CL 102 98* 99* 102  CO2 29  --  31 33*  GLUCOSE 134* 127* 131* 105*  BUN 30* 31* 25* 22*  CREATININE 1.28* 1.10 1.13 1.03  CALCIUM 9.3  --  9.2 8.4*  AST  --   --  23  --   ALT  --   --  9*  --   ALKPHOS  --   --  64  --   BILITOT  --   --  1.4*  --    ------------------------------------------------------------------------------------------------------------------ No results for input(s): CHOL, HDL, LDLCALC, TRIG, CHOLHDL, LDLDIRECT in the last 72 hours.  No results found for: HGBA1C ------------------------------------------------------------------------------------------------------------------ No results for input(s): TSH, T4TOTAL, T3FREE, THYROIDAB in the last 72 hours.  Invalid input(s): FREET3 ------------------------------------------------------------------------------------------------------------------ No results for input(s): VITAMINB12, FOLATE, FERRITIN, TIBC, IRON, RETICCTPCT in the last 72 hours.  Coagulation profile  Recent Labs Lab 10/08/16 1711  INR 1.01    No results for input(s): DDIMER in the last 72 hours.  Cardiac Enzymes No results for input(s): CKMB, TROPONINI, MYOGLOBIN in the last 168 hours.  Invalid input(s): CK ------------------------------------------------------------------------------------------------------------------    Component Value Date/Time   BNP 240.0  (H) 10/08/2016 1348    Micro Results Recent Results (from the past 240 hour(s))  Blood culture (routine x 2)     Status: None (Preliminary result)   Collection Time: 10/08/16  2:10 PM  Result Value Ref Range Status   Specimen Description BLOOD LEFT HAND  Final   Special Requests BOTTLES DRAWN AEROBIC AND ANAEROBIC 6CC EACH  Final   Culture NO GROWTH < 24 HOURS  Final   Report Status PENDING  Incomplete  Blood culture (routine x 2)     Status: None (Preliminary result)   Collection Time: 10/08/16  2:15 PM  Result Value Ref Range Status   Specimen Description BLOOD RIGHT HAND  Final   Special Requests BOTTLES DRAWN AEROBIC AND ANAEROBIC [place]  Final  Culture NO GROWTH < 24 HOURS  Final   Report Status PENDING  Incomplete  Culture, expectorated sputum-assessment     Status: None   Collection Time: 10/08/16  2:20 PM  Result Value Ref Range Status   Specimen Description SPU  Final   Special Requests NONE  Final   Sputum evaluation THIS SPECIMEN IS ACCEPTABLE FOR SPUTUM CULTURE  Final   Report Status 10/09/2016 FINAL  Final  MRSA PCR Screening     Status: None   Collection Time: 10/09/16  5:00 AM  Result Value Ref Range Status   MRSA by PCR NEGATIVE NEGATIVE Final    Comment:        The GeneXpert MRSA Assay (FDA approved for NASAL specimens only), is one component of a comprehensive MRSA colonization surveillance program. It is not intended to diagnose MRSA infection nor to guide or monitor treatment for MRSA infections.     Radiology Reports Dg Chest 2 View  Result Date: 10/05/2016 CLINICAL DATA:  Central chest pain after drilling above head, dropped drill onto chest. History of COPD, ischemic cardiomyopathy and pulmonary embolism. EXAM: CHEST  2 VIEW COMPARISON:  Chest radiograph August 01, 2016 and CT chest December 22, 2015 FINDINGS: The cardiac silhouette is mildly enlarged. Coronary artery calcifications versus stent. Similar fullness of LEFT pulmonary hila. Increased  lung volumes with strandy densities in lung bases. Blunting of the posterior costophrenic angles. No pneumothorax. Three lead LEFT cardiac pacemaker in situ. Surgical clips in the neck most compatible with thyroidectomy. Old single level mid thoracic severe compression fracture. IMPRESSION: Mild cardiomegaly. COPD and bibasilar atelectasis/ scarring. Small posterior pleural effusions versus pleural thickening. Fullness of the hila suggest vascular engorgement. Electronically Signed   By: Elon Alas M.D.   On: 10/05/2016 17:54   Dg Chest Portable 1 View  Result Date: 10/08/2016 CLINICAL DATA:  Shortness of breath, labored breathing, sinuses, wheezing EXAM: PORTABLE CHEST 1 VIEW COMPARISON:  10/05/2016 FINDINGS: Patchy right lower lobe opacity, new, suspicious for pneumonia. Left lung is clear. No pleural effusion or pneumothorax. Cardiomegaly.  Left subclavian ICD. IMPRESSION: Patchy right lower lobe opacity, new, suspicious for pneumonia. Electronically Signed   By: Julian Hy M.D.   On: 10/08/2016 13:43    Time Spent in minutes  30   SINGH,PRASHANT K M.D on 10/09/2016 at 9:10 AM  Between 7am to 7pm - Pager - [phone]  After 7pm go to [email or web address] - password [facility]  [facility] -  Office  [phone]

## 2016-10-09 NOTE — Progress Notes (Signed)
Patient ambulated twice around the floor with no oxygen. Able to maintain O2 saturations >92% without O2. Patient was still slightly short of breath but recovered quickly. Will continue to monitor.

## 2016-10-09 NOTE — Progress Notes (Signed)
Pt ambulated around the unit. O2 sats dropped to 85 on room air. Pt returned to room; recovered quickly after applying oxygen via [place] @ 2L. Continue to monitor.

## 2016-10-10 DIAGNOSIS — I471 Supraventricular tachycardia: Secondary | ICD-10-CM

## 2016-10-10 MED ORDER — AZITHROMYCIN 250 MG PO TABS: 250.0000 mg | ORAL_TABLET | Freq: Every day | ORAL | 0 refills | Status: DC

## 2016-10-10 MED ORDER — AMOXICILLIN-POT CLAVULANATE 500-125 MG PO TABS: 1.0000 | ORAL_TABLET | Freq: Three times a day (TID) | ORAL | 0 refills | Status: DC

## 2016-10-10 MED ORDER — ALBUTEROL SULFATE (2.5 MG/3ML) 0.083% IN NEBU: 2.5000 mg | INHALATION_SOLUTION | Freq: Four times a day (QID) | RESPIRATORY_TRACT | 0 refills | Status: DC | PRN

## 2016-10-10 NOTE — Progress Notes (Signed)
Patient being discharged to home. Paperwork and prescriptions given to patient. IV access removed without incident. Will take out by wheelchair and wife will drive patient home by car.

## 2016-10-10 NOTE — Discharge Instructions (Signed)
Follow with Primary MD Glenda Chroman, MD in 4 days , get final sputum culture results reviewed  Get CBC, CMP, 2 view Chest X ray checked  by Primary MD or SNF MD in 4 days ( we routinely change or add medications that can affect your baseline labs and fluid status, therefore we recommend that you get the mentioned basic workup next visit with your PCP, your PCP may decide not to get them or add new tests based on their clinical decision)   Activity: As tolerated with Full fall precautions use walker/cane & assistance as needed   Disposition Home     Diet:   Heart Healthy   Check your Weight same time everyday, if you gain over 2 pounds, or you develop in leg swelling, experience more shortness of breath or chest pain, call your Primary MD immediately. Follow Cardiac Low Salt Diet and 1.5 lit/day fluid restriction.   On your next visit with your primary care physician please Get Medicines reviewed and adjusted.   Please request your Prim.MD to go over all Hospital Tests and Procedure/Radiological results at the follow up, please get all Hospital records sent to your Prim MD by signing hospital release before you go home.   If you experience worsening of your admission symptoms, develop shortness of breath, life threatening emergency, suicidal or homicidal thoughts you must seek medical attention immediately by calling 911 or calling your MD immediately  if symptoms less severe.  You Must read complete instructions/literature along with all the possible adverse reactions/side effects for all the Medicines you take and that have been prescribed to you. Take any new Medicines after you have completely understood and accpet all the possible adverse reactions/side effects.   Do not drive, operate heavy machinery, perform activities at heights, swimming or participation in water activities or provide baby sitting services if your were admitted for syncope or siezures until you have seen by Primary MD  or a Neurologist and advised to do so again.  Do not drive when taking Pain medications.    Do not take more than prescribed Pain, Sleep and Anxiety Medications  Special Instructions: If you have smoked or chewed Tobacco  in the last 2 yrs please stop smoking, stop any regular Alcohol  and or any Recreational drug use.  Wear Seat belts while driving.   Please note  You were cared for by a hospitalist during your hospital stay. If you have any questions about your discharge medications or the care you received while you were in the hospital after you are discharged, you can call the unit and asked to speak with the hospitalist on call if the hospitalist that took care of you is not available. Once you are discharged, your primary care physician will handle any further medical issues. Please note that NO REFILLS for any discharge medications will be authorized once you are discharged, as it is imperative that you return to your primary care physician (or establish a relationship with a primary care physician if you do not have one) for your aftercare needs so that they can reassess your need for medications and monitor your lab values.

## 2016-10-10 NOTE — Discharge Summary (Signed)
[name] [id number] DOB: Feb 03, 1941 DOA: 10/08/2016  PCP: Glenda Chroman, MD  Admit date: 10/08/2016  Discharge date: 10/10/2016  Admitted From: Home   Disposition:  Home   Recommendations for Outpatient Follow-up:   Follow up with PCP in 1-2 weeks  PCP Please obtain BMP/CBC, 2 view CXR in 1week,  (see Discharge instructions)   PCP Please follow up on the following pending results: Follow final sputum culture results   Home Health: None   Equipment/Devices: Neb machine and Meds  Consultations: None Discharge Condition: Stable   CODE STATUS: Full   Diet Recommendation:  Heart Healthy    Chief Complaint  Patient presents with  . Shortness of Breath     Brief history of present illness from the day of admission and additional interim summary    [name] a 75 Andrews,With ischemic cardiomyopathy with V. tach status post AICD placement, chronic diastolic heart failure however EF now 55% on latest echocardiogram, CAD, dyslipidemia, COPD, remote history of smoking, who stays at home and had her accident where he hurt his chest wall while using a drill and a ladder, this happened a few days ago he came to the ER was diagnosed with musculoskeletal rib cage injury and sent home. He now comes back with gradually progressive shortness of breath, productive cough and low-grade subjective fevers. In the ER he was diagnosed with pneumonia, possible early sepsis and I was called to admit.  Besides shortness of breath, pleuritic chest pain on taking deep breaths, productive cough and subjective fevers his review of systems is negative, he did get flu shot this year, denies any sick contacts along driver travel, after breathing treatment he feels a whole lot better.  Hospital issues addressed     1.  Early sepsis due to community-acquired pneumonia. In the setting of substernal injury which he incurred few days ago - likely being brought on by atelectasis due to pleuritic chest pain from chest injury, responded well to empiric antibiotics will be placed on oral antibiotics for 4 more days as below, clinically pneumonia has resolved, he is symptom free no oxygen requirement. Will be discharged with PCP follow-up within 1 week request PCP to check CBC, BMP and a 2 view chest x-ray next visit.  2.Chronic ischemic cardiomyopathy status post V. tach/AICD placement. Currently stable. Continue flecainide, home dose beta blocker along with home dose diuretic.  3.Chronic diastolic CHF EF now improved to 55-60% on recent echocardiogram. Compensated continue home regimen unchanged.  4.COPD. No wheezing, supportive care with nebulizer treatments before.  5.Hypothyroidism. Continue home dose Synthroid.  6.GERD. On PPI.      Discharge diagnosis     Principal Problem:   CAP (community acquired pneumonia) Active Problems:   Centrilobular emphysema ([place])   Ischemic cardiomyopathy   Ventricular tachycardia ([place])   Carotid artery disease ([place])   Pulmonary embolism (H[facility])   Biventricular implantable cardioverter-defibrillator in situ   Chronic systolic heart failure ([place])   Atrial tachycardia ([place])   Coronary artery disease involving native coronary  artery without angina pectoris   Pneumonia   Sepsis [facility])    Discharge instructions    Discharge Instructions    DME Nebulizer/meds    Complete by:  As directed    Patient needs a nebulizer to treat with the following condition:  COPD (chronic obstructive pulmonary disease) (HCC)   Diet - low sodium heart healthy    Complete by:  As directed    Discharge instructions    Complete by:  As directed    Follow with Primary MD Glenda Chroman, MD in 4 days , get final sputum culture results reviewed  Get CBC, CMP, 2 view Chest X ray checked   by Primary MD or SNF MD in 4 days ( we routinely change or add medications that can affect your baseline labs and fluid status, therefore we recommend that you get the mentioned basic workup next visit with your PCP, your PCP may decide not to get them or add new tests based on their clinical decision)   Activity: As tolerated with Full fall precautions use walker/cane & assistance as needed   Disposition Home     Diet:   Heart Healthy   Check your Weight same time everyday, if you gain over 2 pounds, or you develop in leg swelling, experience more shortness of breath or chest pain, call your Primary MD immediately. Follow Cardiac Low Salt Diet and 1.5 lit/day fluid restriction.   On your next visit with your primary care physician please Get Medicines reviewed and adjusted.   Please request your Prim.MD to go over all Hospital Tests and Procedure/Radiological results at the follow up, please get all Hospital records sent to your Prim MD by signing hospital release before you go home.   If you experience worsening of your admission symptoms, develop shortness of breath, life threatening emergency, suicidal or homicidal thoughts you must seek medical attention immediately by calling 911 or calling your MD immediately  if symptoms less severe.  You Must read complete instructions/literature along with all the possible adverse reactions/side effects for all the Medicines you take and that have been prescribed to you. Take any new Medicines after you have completely understood and accpet all the possible adverse reactions/side effects.   Do not drive, operate heavy machinery, perform activities at heights, swimming or participation in water activities or provide baby sitting services if your were admitted for syncope or siezures until you have seen by Primary MD or a Neurologist and advised to do so again.  Do not drive when taking Pain medications.    Do not take more than prescribed Pain,  Sleep and Anxiety Medications  Special Instructions: If you have smoked or chewed Tobacco  in the last 2 yrs please stop smoking, stop any regular Alcohol  and or any Recreational drug use.  Wear Seat belts while driving.   Please note  You were cared for by a hospitalist during your hospital stay. If you have any questions about your discharge medications or the care you received while you were in the hospital after you are discharged, you can call the unit and asked to speak with the hospitalist on call if the hospitalist that took care of you is not available. Once you are discharged, your primary care physician will handle any further medical issues. Please note that NO REFILLS for any discharge medications will be authorized once you are discharged, as it is imperative that you return to your primary care physician (or establish a relationship with  a primary care physician if you do not have one) for your aftercare needs so that they can reassess your need for medications and monitor your lab values.   Increase activity slowly    Complete by:  As directed       Discharge Medications   Allergies as of 10/10/2016   No Known Allergies     Medication List    TAKE these medications   albuterol (2.5 MG/3ML) 0.083% nebulizer solution Commonly known as:  PROVENTIL Take 3 mLs (2.5 mg total) by nebulization every 6 (six) hours as needed for wheezing or shortness of breath.   amoxicillin-clavulanate 500-125 MG tablet Commonly known as:  AUGMENTIN Take 1 tablet (500 mg total) by mouth 3 (three) times daily.   aspirin 81 MG tablet Take 81 mg by mouth daily.   azithromycin 250 MG tablet Commonly known as:  ZITHROMAX Take 1 tablet (250 mg total) by mouth daily.   calcium-vitamin D 500-200 MG-UNIT tablet Commonly known as:  OSCAL WITH D Take 1 tablet by mouth daily.   carvedilol 25 MG tablet Commonly known as:  COREG Take .5 (12.5 mg total) by mouth 2 (two) times daily with a  meal.   CORTIZONE-10 PLUS EX Apply 1 application topically as directed. Use as needed   eplerenone 25 MG tablet Commonly known as:  INSPRA Take 1 tablet (25 mg total) by mouth daily.   flecainide 50 MG tablet Commonly known as:  TAMBOCOR Take 1 tablet (50 mg) daily. May take additional tablet (50 mg) if heart is racing.   fluticasone furoate-vilanterol 100-25 MCG/INH Aepb Commonly known as:  BREO ELLIPTA Inhale 1 puff into the lungs daily.   furosemide 20 MG tablet Commonly known as:  LASIX Take 3 tablets (60 mg total) by mouth daily. What changed:  how much to take  how to take this   HYDROcodone-acetaminophen 5-325 MG tablet Commonly known as:  NORCO/VICODIN Take 1 tablet by mouth every 4 (four) hours as needed.   levothyroxine 112 MCG tablet Commonly known as:  SYNTHROID, LEVOTHROID Take 112 mcg by mouth daily before breakfast.   multivitamin with minerals tablet Take 1 tablet by mouth daily.   niacin 500 MG tablet Take 500 mg by mouth daily with breakfast.   ondansetron 4 MG disintegrating tablet Commonly known as:  ZOFRAN ODT Take 1 tablet (4 mg total) by mouth every 8 (eight) hours as needed for nausea.   predniSONE 10 MG tablet Commonly known as:  DELTASONE Take 20 mg by mouth daily.   simvastatin 20 MG tablet Commonly known as:  ZOCOR Take 1 tablet (20 mg total) by mouth at bedtime.   trandolapril 2 MG tablet Commonly known as:  MAVIK Take 3 mg by mouth daily.            Durable Medical Equipment        Start     Ordered   10/10/16 0000  DME Nebulizer/meds    Question:  Patient needs a nebulizer to treat with the following condition  Answer:  COPD (chronic obstructive pulmonary disease) ([place])   10/10/16 0750      Follow-up Information    Glenda Chroman, MD. Schedule an appointment as soon as possible for a visit in 4 day(s).   Specialty:  Internal Medicine Contact information: [address] [place] [address] [phone]            Major procedures and Radiology Reports - PLEASE review detailed and final reports thoroughly  -  Dg Chest 2 View  Result Date: 10/09/2016 CLINICAL DATA:  Community acquired pneumonia, sternal fracture, ischemic cardiomyopathy, history emphysema, former smoker EXAM: CHEST  2 VIEW COMPARISON:  10/08/2016 FINDINGS: LEFT subclavian transvenous pacemaker/AICD leads project at RIGHT atrium, RIGHT ventricle, and coronary sinus, unchanged. Upper normal heart size. Mediastinal contours and pulmonary vascularity normal. Atherosclerotic calcification aorta. Emphysematous and bronchitic changes consistent with COPD. RIGHT basilar infiltrate, slightly improved. No definite pleural effusion or pneumothorax. Bones demineralized. IMPRESSION: COPD changes with improved RIGHT basilar infiltrate. Aortic atherosclerosis. Electronically Signed   By: Lavonia Dana M.D.   On: 10/09/2016 10:32   Dg Chest 2 View  Result Date: 10/05/2016 CLINICAL DATA:  Central chest pain after drilling above head, dropped drill onto chest. History of COPD, ischemic cardiomyopathy and pulmonary embolism. EXAM: CHEST  2 VIEW COMPARISON:  Chest radiograph August 01, 2016 and CT chest December 22, 2015 FINDINGS: The cardiac silhouette is mildly enlarged. Coronary artery calcifications versus stent. Similar fullness of LEFT pulmonary hila. Increased lung volumes with strandy densities in lung bases. Blunting of the posterior costophrenic angles. No pneumothorax. Three lead LEFT cardiac pacemaker in situ. Surgical clips in the neck most compatible with thyroidectomy. Old single level mid thoracic severe compression fracture. IMPRESSION: Mild cardiomegaly. COPD and bibasilar atelectasis/ scarring. Small posterior pleural effusions versus pleural thickening. Fullness of the hila suggest vascular engorgement. Electronically Signed   By: Elon Alas M.D.   On: 10/05/2016 17:54   Dg Chest Portable 1 View  Result Date: 10/08/2016 CLINICAL  DATA:  Shortness of breath, labored breathing, sinuses, wheezing EXAM: PORTABLE CHEST 1 VIEW COMPARISON:  10/05/2016 FINDINGS: Patchy right lower lobe opacity, new, suspicious for pneumonia. Left lung is clear. No pleural effusion or pneumothorax. Cardiomegaly.  Left subclavian ICD. IMPRESSION: Patchy right lower lobe opacity, new, suspicious for pneumonia. Electronically Signed   By: Julian Hy M.D.   On: 10/08/2016 13:43    Micro Results    Recent Results (from the past 240 hour(s))  Blood culture (routine x 2)     Status: None (Preliminary result)   Collection Time: 10/08/16  2:10 PM  Result Value Ref Range Status   Specimen Description BLOOD LEFT HAND  Final   Special Requests BOTTLES DRAWN AEROBIC AND ANAEROBIC 6CC EACH  Final   Culture NO GROWTH < 24 HOURS  Final   Report Status PENDING  Incomplete  Blood culture (routine x 2)     Status: None (Preliminary result)   Collection Time: 10/08/16  2:15 PM  Result Value Ref Range Status   Specimen Description BLOOD RIGHT HAND  Final   Special Requests BOTTLES DRAWN AEROBIC AND ANAEROBIC [place]  Final   Culture NO GROWTH < 24 HOURS  Final   Report Status PENDING  Incomplete  Culture, expectorated sputum-assessment     Status: None   Collection Time: 10/08/16  2:20 PM  Result Value Ref Range Status   Specimen Description SPU  Final   Special Requests NONE  Final   Sputum evaluation THIS SPECIMEN IS ACCEPTABLE FOR SPUTUM CULTURE  Final   Report Status 10/09/2016 FINAL  Final  Culture, respiratory (NON-Expectorated)     Status: None (Preliminary result)   Collection Time: 10/08/16  2:20 PM  Result Value Ref Range Status   Specimen Description SPU  Final   Special Requests NONE Reflexed from [id number]  Final   Gram Stain   Final    ABUNDANT WBC PRESENT, PREDOMINANTLY PMN RARE SQUAMOUS EPITHELIAL CELLS PRESENT ABUNDANT GRAM NEGATIVE  RODS MODERATE GRAM POSITIVE RODS RARE GRAM POSITIVE COCCI IN PAIRS Performed at [facility]    Culture PENDING  Incomplete   Report Status PENDING  Incomplete  MRSA PCR Screening     Status: None   Collection Time: 10/09/16  5:00 AM  Result Value Ref Range Status   MRSA by PCR NEGATIVE NEGATIVE Final    Comment:        The GeneXpert MRSA Assay (FDA approved for NASAL specimens only), is one component of a comprehensive MRSA colonization surveillance program. It is not intended to diagnose MRSA infection nor to guide or monitor treatment for MRSA infections.     Today   Subjective    [name] today has no headache,no chest abdominal pain,no new weakness tingling or numbness, feels much better wants to go home today.    Objective   Blood pressure (!) 153/79, pulse 76, temperature 98.6 F (37 C), temperature source Oral, resp. rate (!) 22, height 5\' 8"  (1.727 m), weight 62.4 kg (137 lb 9.1 oz), SpO2 95 %.   Intake/Output Summary (Last 24 hours) at 10/10/16 0750 Last data filed at 10/10/16 0734  Gross per 24 hour  Intake          1916.25 ml  Output                0 ml  Net          1916.25 ml    Exam Awake Alert, Oriented x 3, No new F.N deficits, Normal affect [place].AT,PERRAL Supple Neck,No JVD, No cervical lymphadenopathy appriciated.  Symmetrical Chest wall movement, Good air movement bilaterally, CTAB RRR,No Gallops,Rubs or new Murmurs, No Parasternal Heave +ve B.Sounds, Abd Soft, Non tender, No organomegaly appriciated, No rebound -guarding or rigidity. No Cyanosis, Clubbing or edema, No new Rash or bruise   Data Review   CBC w Diff:  Lab Results  Component Value Date   WBC 14.5 (H) 10/09/2016   HGB 12.7 (L) 10/09/2016   HCT 38.8 (L) 10/09/2016   PLT 143 (L) 10/09/2016   LYMPHOPCT 6 10/08/2016   MONOPCT 3 10/08/2016   EOSPCT 0 10/08/2016   BASOPCT 0 10/08/2016    CMP:  Lab Results  Component Value Date   NA 140 10/09/2016   K 4.2 10/09/2016   CL 102 10/09/2016   CO2 33 (H) 10/09/2016   BUN 22 (H) 10/09/2016   CREATININE  1.03 10/09/2016   CREATININE 0.98 07/28/2016   PROT 7.1 10/08/2016   ALBUMIN 4.3 10/08/2016   BILITOT 1.4 (H) 10/08/2016   ALKPHOS 64 10/08/2016   AST 23 10/08/2016   ALT 9 (L) 10/08/2016  .   Total Time in preparing paper work, data evaluation and todays exam - 35 minutes  Thurnell Lose M.D on 10/10/2016 at 7:50 AM  [facility]   Office  [phone]

## 2016-10-12 LAB — CULTURE, RESPIRATORY

## 2016-10-12 LAB — CULTURE, RESPIRATORY W GRAM STAIN

## 2016-10-12 LAB — EXERCISE TOLERANCE TEST
CHL CUP RESTING HR STRESS: 64 {beats}/min
CHL RATE OF PERCEIVED EXERTION: 17
CSEPEDS: 0 s
CSEPEW: 7 METS
Exercise duration (min): 12 min
MPHR: 146 {beats}/min
Peak HR: 111 {beats}/min
Percent HR: 76 %

## 2016-10-13 ENCOUNTER — Telehealth: Payer: Self-pay

## 2016-10-13 ENCOUNTER — Ambulatory Visit (INDEPENDENT_AMBULATORY_CARE_PROVIDER_SITE_OTHER): Payer: Medicare Other

## 2016-10-13 DIAGNOSIS — I10 Essential (primary) hypertension: Secondary | ICD-10-CM | POA: Diagnosis not present

## 2016-10-13 DIAGNOSIS — I5023 Acute on chronic systolic (congestive) heart failure: Secondary | ICD-10-CM | POA: Diagnosis not present

## 2016-10-13 DIAGNOSIS — Z09 Encounter for follow-up examination after completed treatment for conditions other than malignant neoplasm: Secondary | ICD-10-CM | POA: Diagnosis not present

## 2016-10-13 DIAGNOSIS — J449 Chronic obstructive pulmonary disease, unspecified: Secondary | ICD-10-CM | POA: Diagnosis not present

## 2016-10-13 DIAGNOSIS — J189 Pneumonia, unspecified organism: Secondary | ICD-10-CM | POA: Diagnosis not present

## 2016-10-13 DIAGNOSIS — Z950 Presence of cardiac pacemaker: Secondary | ICD-10-CM

## 2016-10-13 DIAGNOSIS — Z299 Encounter for prophylactic measures, unspecified: Secondary | ICD-10-CM | POA: Diagnosis not present

## 2016-10-13 DIAGNOSIS — I5022 Chronic systolic (congestive) heart failure: Secondary | ICD-10-CM

## 2016-10-13 NOTE — Progress Notes (Signed)
EPIC Encounter for ICM Monitoring  Patient Name: [name] is a 75 y.o. male Date: 10/13/2016 Primary Care Physican: Glenda Chroman, MD Primary Cardiologist: Lovena Le Electrophysiologist: Lovena Le Dry Weight:     unknown Bi-V Pacing:  99.4%        Received call from wife.  Heart Failure questions reviewed, pt symptomatic with leg swelling and non productive cough.  Hospital discharge on 12/25 with dx of pneumonia after injury to chest on 12/20.  He had a fracture of the sternum from a drill.   Thoracic impedance abnormal suggesting fluid accumulation starting 12/20.  Labs:  Wife reported labs will be drawn today at PCP office.  10/09/2016 Creatinine 1.03, BUN 22, Potassium 4.2, Sodium 140, EGFR >60 (during hospitalization) 10/08/2016 Creatinine 1.13, BUN 25, Potassium 4.0, Sodium 140, EGFR >60 (during hospitalization) 10/05/2016 Creatinine 1.28, BUN 30, Potassium 3.8, Sodium 140, EGFR 53-60 (during hospitalization) 07/28/2016 Creatinine 0.98, BUN 12, Potassium 4.5, Sodium 141  05/16/2016 Creatinine 1.09, BUN 24, Potassium 4.1, Sodium 140 04/28/2016 Creatinine 1.79, BUN 33, Potassium 4.6, Sodium 140  12/08/2015 Creatinine 1.00, BUN 16, Potassium 4.2, Sodium 141  11/10/2015 Creatinine 1.05, BUN 14, Potassium 3.8, Sodium 140   Recommendations: Confirmed Lasix dosages.  Recommended he take Furosemide 20 mg 3 tablets in am and 3 tablets in pm x 3 days only and then return to prescribed dosage of 3 tablets every morning.    Follow-up plan: ICM clinic phone appointment on 10/18/2016 for fluid recheck.  He has an appointment with Dr Woody Seller at 11:30 this am.  Advised wife if Dr Woody Seller has other recommendations regarding the Lasix to follow his direction since he will be seeing patient this morning.    Copy of ICM check sent to device physician for review.   3 month ICM trend : 10/13/2016   1 Year ICM trend:      Rosalene Billings, RN 10/13/2016 8:57 AM

## 2016-10-13 NOTE — Telephone Encounter (Signed)
Received call from wife.  She stated patient has been in hospital due to fractured sternum from a drill and pneumonia.  She stated his feet started swelling last night.  She asked to send remote transmission for review and they will be seeing PCP today.  Requested she send transmission now.  See ICM note.

## 2016-10-14 LAB — CULTURE, BLOOD (ROUTINE X 2)
Culture: NO GROWTH
Culture: NO GROWTH

## 2016-10-14 NOTE — Telephone Encounter (Signed)
This encounter was created in error - please disregard.

## 2016-10-21 ENCOUNTER — Ambulatory Visit (INDEPENDENT_AMBULATORY_CARE_PROVIDER_SITE_OTHER): Payer: Medicare Other

## 2016-10-21 DIAGNOSIS — Z87891 Personal history of nicotine dependence: Secondary | ICD-10-CM | POA: Diagnosis not present

## 2016-10-21 DIAGNOSIS — I5022 Chronic systolic (congestive) heart failure: Secondary | ICD-10-CM

## 2016-10-21 DIAGNOSIS — Z299 Encounter for prophylactic measures, unspecified: Secondary | ICD-10-CM | POA: Diagnosis not present

## 2016-10-21 DIAGNOSIS — Z950 Presence of cardiac pacemaker: Secondary | ICD-10-CM

## 2016-10-21 DIAGNOSIS — J189 Pneumonia, unspecified organism: Secondary | ICD-10-CM | POA: Diagnosis not present

## 2016-10-21 DIAGNOSIS — Z713 Dietary counseling and surveillance: Secondary | ICD-10-CM | POA: Diagnosis not present

## 2016-10-21 DIAGNOSIS — Z6821 Body mass index (BMI) 21.0-21.9, adult: Secondary | ICD-10-CM | POA: Diagnosis not present

## 2016-10-21 NOTE — Progress Notes (Signed)
EPIC Encounter for ICM Monitoring  Patient Name: [name] is a 76 y.o. male Date: 10/21/2016 Primary Care Physican: Glenda Chroman, MD Primary Cardiologist:Taylor Electrophysiologist: Druscilla Brownie Weight:unknown Bi-V Pacing: 99.4%      Spoke with wife.  Heart Failure questions reviewed, pt still has a productive cough (dx of pneumonia) but the color of phlegm is turning green today.  She plans on calling either urgent care or PCP to determine if he needs another round of antibiotics for the pneumonia  Thoracic impedance almost at baseline after taking extra Furosemide.   Labs:    10/09/2016 Creatinine 1.03, BUN 22, Potassium 4.2, Sodium 140, EGFR >60 (during hospitalization) 10/08/2016 Creatinine 1.13, BUN 25, Potassium 4.0, Sodium 140, EGFR >60 (during hospitalization) 10/05/2016 Creatinine 1.28, BUN 30, Potassium 3.8, Sodium 140, EGFR 53-60 (during hospitalization) 07/28/2016 Creatinine 0.98, BUN 12, Potassium 4.5, Sodium 141  05/16/2016 Creatinine 1.09, BUN 24, Potassium 4.1, Sodium 140 04/28/2016 Creatinine 1.79, BUN 33, Potassium 4.6, Sodium 140  12/08/2015 Creatinine 1.00, BUN 16, Potassium 4.2, Sodium 141  11/10/2015 Creatinine 1.05, BUN 14, Potassium 3.8, Sodium 140   Recommendations: No changes.  Reinforced to limit low salt food choices to 2000 mg day and limiting fluid intake to < 2 liters per day. Encouraged to call for fluid symptoms.    Follow-up plan: ICM clinic phone appointment on 10/31/2016 to recheck fluid levels post pneumonia.  Copy of ICM check sent to device physician.   3 month ICM trend : 10/18/2016   1 Year ICM trend:      Rosalene Billings, RN 10/21/2016 9:19 AM

## 2016-10-25 DIAGNOSIS — Z125 Encounter for screening for malignant neoplasm of prostate: Secondary | ICD-10-CM | POA: Diagnosis not present

## 2016-10-25 DIAGNOSIS — Z79899 Other long term (current) drug therapy: Secondary | ICD-10-CM | POA: Diagnosis not present

## 2016-10-25 DIAGNOSIS — Z7189 Other specified counseling: Secondary | ICD-10-CM | POA: Diagnosis not present

## 2016-10-25 DIAGNOSIS — R5383 Other fatigue: Secondary | ICD-10-CM | POA: Diagnosis not present

## 2016-10-25 DIAGNOSIS — Z0000 Encounter for general adult medical examination without abnormal findings: Secondary | ICD-10-CM | POA: Diagnosis not present

## 2016-10-25 DIAGNOSIS — Z1211 Encounter for screening for malignant neoplasm of colon: Secondary | ICD-10-CM | POA: Diagnosis not present

## 2016-10-25 DIAGNOSIS — E7800 Pure hypercholesterolemia, unspecified: Secondary | ICD-10-CM | POA: Diagnosis not present

## 2016-10-25 DIAGNOSIS — Z1389 Encounter for screening for other disorder: Secondary | ICD-10-CM | POA: Diagnosis not present

## 2016-10-25 DIAGNOSIS — Z87891 Personal history of nicotine dependence: Secondary | ICD-10-CM | POA: Diagnosis not present

## 2016-10-25 DIAGNOSIS — Z299 Encounter for prophylactic measures, unspecified: Secondary | ICD-10-CM | POA: Diagnosis not present

## 2016-10-31 ENCOUNTER — Telehealth: Payer: Self-pay | Admitting: Cardiology

## 2016-10-31 ENCOUNTER — Ambulatory Visit (INDEPENDENT_AMBULATORY_CARE_PROVIDER_SITE_OTHER): Payer: Medicare Other

## 2016-10-31 DIAGNOSIS — I5022 Chronic systolic (congestive) heart failure: Secondary | ICD-10-CM

## 2016-10-31 DIAGNOSIS — Z950 Presence of cardiac pacemaker: Secondary | ICD-10-CM

## 2016-10-31 NOTE — Telephone Encounter (Signed)
Confirmed remote transmission w/ pt wife.   

## 2016-10-31 NOTE — Progress Notes (Signed)
EPIC Encounter for ICM Monitoring  Patient Name: [name] is a 76 y.o. male Date: 10/31/2016 Primary Care Physican: Glenda Chroman, MD Primary Cardiologist:Taylor Electrophysiologist: Druscilla Brownie Weight:unknown Bi-V Pacing: 99.4%        Spoke with wife.  Heart Failure questions reviewed, pt has some chest congestion but has improved  Thoracic impedance abnormal suggesting fluid accumulation.  Labs:   10/09/2016 Creatinine 1.03, BUN 22, Potassium 4.2, Sodium 140, EGFR >60 (during hospitalization) 10/08/2016 Creatinine 1.13, BUN 25, Potassium 4.0, Sodium 140, EGFR >60 (during hospitalization) 10/05/2016 Creatinine 1.28, BUN 30, Potassium 3.8, Sodium 140, EGFR 53-60 (during hospitalization) 07/28/2016 Creatinine 0.98, BUN 12, Potassium 4.5, Sodium 141  05/16/2016 Creatinine 1.09, BUN 24, Potassium 4.1, Sodium 140 04/28/2016 Creatinine 1.79, BUN 33, Potassium 4.6, Sodium 140  12/08/2015 Creatinine 1.00, BUN 16, Potassium 4.2, Sodium 141  11/10/2015 Creatinine 1.05, BUN 14, Potassium 3.8, Sodium 140   Recommendations:  Increase Furosemide to 60 mg bid x 3 days and return to prescribed dosage of 60 mg daily.  She verbalized understanding.   Follow-up plan: ICM clinic phone appointment on 11/04/2016.  Copy of ICM check sent to device physician.   3 month ICM trend: 10/31/2016   1 Year ICM trend:      Rosalene Billings, RN 10/31/2016 3:38 PM

## 2016-11-04 ENCOUNTER — Ambulatory Visit (INDEPENDENT_AMBULATORY_CARE_PROVIDER_SITE_OTHER): Payer: Medicare Other

## 2016-11-04 DIAGNOSIS — I5022 Chronic systolic (congestive) heart failure: Secondary | ICD-10-CM

## 2016-11-04 DIAGNOSIS — Z950 Presence of cardiac pacemaker: Secondary | ICD-10-CM

## 2016-11-04 NOTE — Progress Notes (Signed)
EPIC Encounter for ICM Monitoring  Patient Name: [name] is a 76 y.o. male Date: 11/04/2016 Primary Care Physican: Glenda Chroman, MD Primary Cardiologist:Taylor Electrophysiologist: Druscilla Brownie Weight:unknown Bi-V Pacing: 99.8%        Spoke with wife.  Heart Failure questions reviewed, pt chest congestion has resolved.  Thoracic impedance returned to normal after 3 days of increased Furosemide.    Labs:  10/09/2016 Creatinine 1.03, BUN 22, Potassium 4.2, Sodium 140, EGFR >60 (during hospitalization) 10/08/2016 Creatinine 1.13, BUN 25, Potassium 4.0, Sodium 140, EGFR >60 (during hospitalization) 10/05/2016 Creatinine 1.28, BUN 30, Potassium 3.8, Sodium 140, EGFR 53-60 (during hospitalization) 07/28/2016 Creatinine 0.98, BUN 12, Potassium 4.5, Sodium 141  05/16/2016 Creatinine 1.09, BUN 24, Potassium 4.1, Sodium 140 04/28/2016 Creatinine 1.79, BUN 33, Potassium 4.6, Sodium 140  12/08/2015 Creatinine 1.00, BUN 16, Potassium 4.2, Sodium 141  11/10/2015 Creatinine 1.05, BUN 14, Potassium 3.8, Sodium 140   Recommendations: No changes.  Encouraged to call for fluid symptoms.  Follow-up plan: ICM clinic phone appointment on 11/22/2016.  Copy of ICM check sent to device physician.   3 month ICM trend: 11/03/2016   1 Year ICM trend:      Rosalene Billings, RN 11/04/2016 3:11 PM

## 2016-11-07 DIAGNOSIS — J441 Chronic obstructive pulmonary disease with (acute) exacerbation: Secondary | ICD-10-CM | POA: Diagnosis not present

## 2016-11-07 DIAGNOSIS — J449 Chronic obstructive pulmonary disease, unspecified: Secondary | ICD-10-CM | POA: Diagnosis not present

## 2016-11-07 DIAGNOSIS — R05 Cough: Secondary | ICD-10-CM | POA: Diagnosis not present

## 2016-11-07 DIAGNOSIS — J189 Pneumonia, unspecified organism: Secondary | ICD-10-CM | POA: Diagnosis not present

## 2016-11-07 DIAGNOSIS — J40 Bronchitis, not specified as acute or chronic: Secondary | ICD-10-CM | POA: Diagnosis not present

## 2016-11-07 DIAGNOSIS — Z95810 Presence of automatic (implantable) cardiac defibrillator: Secondary | ICD-10-CM | POA: Diagnosis not present

## 2016-11-07 DIAGNOSIS — R0981 Nasal congestion: Secondary | ICD-10-CM | POA: Diagnosis not present

## 2016-11-11 DIAGNOSIS — E7800 Pure hypercholesterolemia, unspecified: Secondary | ICD-10-CM | POA: Diagnosis not present

## 2016-11-11 DIAGNOSIS — J449 Chronic obstructive pulmonary disease, unspecified: Secondary | ICD-10-CM | POA: Diagnosis not present

## 2016-11-11 DIAGNOSIS — I2510 Atherosclerotic heart disease of native coronary artery without angina pectoris: Secondary | ICD-10-CM | POA: Diagnosis not present

## 2016-11-11 DIAGNOSIS — M159 Polyosteoarthritis, unspecified: Secondary | ICD-10-CM | POA: Diagnosis not present

## 2016-11-22 ENCOUNTER — Ambulatory Visit (INDEPENDENT_AMBULATORY_CARE_PROVIDER_SITE_OTHER): Payer: Medicare Other

## 2016-11-22 DIAGNOSIS — Z950 Presence of cardiac pacemaker: Secondary | ICD-10-CM | POA: Diagnosis not present

## 2016-11-22 DIAGNOSIS — I5022 Chronic systolic (congestive) heart failure: Secondary | ICD-10-CM

## 2016-11-22 NOTE — Progress Notes (Signed)
EPIC Encounter for ICM Monitoring  Patient Name: [name] is a 76 y.o. male Date: 11/22/2016 Primary Care Physican: Glenda Chroman, MD Primary Cardiologist:Taylor Electrophysiologist: Druscilla Brownie Weight:unknown Bi-V Pacing: 99.4%                                              Heart Failure questions reviewed, pt symptomatic with chest congestion and cough  Thoracic impedance abnormal suggesting fluid accumulation since 11/13/2016.  Labs:  10/09/2016 Creatinine 1.03, BUN 22, Potassium 4.2, Sodium 140, EGFR >60 (during hospitalization) 10/08/2016 Creatinine 1.13, BUN 25, Potassium 4.0, Sodium 140, EGFR >60 (during hospitalization) 10/05/2016 Creatinine 1.28, BUN 30, Potassium 3.8, Sodium 140, EGFR 53-60 (during hospitalization) 07/28/2016 Creatinine 0.98, BUN 12, Potassium 4.5, Sodium 141  05/16/2016 Creatinine 1.09, BUN 24, Potassium 4.1, Sodium 140 04/28/2016 Creatinine 1.79, BUN 33, Potassium 4.6, Sodium 140  12/08/2015 Creatinine 1.00, BUN 16, Potassium 4.2, Sodium 141  11/10/2015 Creatinine 1.05, BUN 14, Potassium 3.8, Sodium 140   Recommendations: Increase Furosemide to 60 mg bid x 3 days and then return to prior dosage of 60 mg daily.  He verbalized understanding  Follow-up plan: ICM clinic phone appointment on 11/25/2016.  Copy of ICM check sent to device physician.   3 month ICM trend: 11/22/2016   1 Year ICM trend:      Rosalene Billings, RN 11/22/2016 8:18 AM

## 2016-11-23 ENCOUNTER — Telehealth: Payer: Self-pay | Admitting: Internal Medicine
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
## 2016-11-24 MED ORDER — FLECAINIDE ACETATE 50 MG PO TABS
ORAL_TABLET | ORAL | 2 refills | Status: DC
Start: 2016-11-24 — End: 2017-02-21

## 2016-11-24 NOTE — Telephone Encounter (Signed)
Called, spoke with pt's wife, Hassan Rowan. Wie inquired if pt needs lab work. Wife stated pt had physical in January of this year. Informed pt likely had labs drawn already. Lab from PCP are not listed in Epic. I explain his PCP may have a different charting system. Inform we can obtain labs at our office, if pt wishes. Wife will call first to PCP, to see if they can fax over most recent lab. Informed pt needs f/u appt in 3 months with Dr. Lovena Le. Informed someone will call pt to set up. Informed Sharman Cheek, RN will continue ICM follow-up (pt should receive a f/u call tomorrow (11/25/16). Wife requested refill of Flecainide. Sent in Rx refill of Flecainide to [facility]. Wife verbalized understanding and agreed with plan.

## 2016-11-25 ENCOUNTER — Ambulatory Visit (INDEPENDENT_AMBULATORY_CARE_PROVIDER_SITE_OTHER): Payer: Medicare Other

## 2016-11-25 ENCOUNTER — Telehealth: Payer: Self-pay | Admitting: Cardiology

## 2016-11-25 DIAGNOSIS — Z950 Presence of cardiac pacemaker: Secondary | ICD-10-CM

## 2016-11-25 DIAGNOSIS — I5022 Chronic systolic (congestive) heart failure: Secondary | ICD-10-CM

## 2016-11-25 NOTE — Progress Notes (Signed)
EPIC Encounter for ICM Monitoring  Patient Name: [name] is a 76 y.o. male Date: 11/25/2016 Primary Care Physican: Glenda Chroman, MD Primary Cardiologist:Taylor Electrophysiologist: Druscilla Brownie Weight:unknown Bi-V Pacing: 99.8%      Heart Failure questions reviewed, pt asymptomatic   Thoracic impedance returned to normal after taking 3 days of increased Furosemide.  Confirmed he has returned to prior dosage of 60 mg daily.    Labs:  10/09/2016 Creatinine 1.03, BUN 22, Potassium 4.2, Sodium 140, EGFR >60 (during hospitalization) 10/08/2016 Creatinine 1.13, BUN 25, Potassium 4.0, Sodium 140, EGFR >60 (during hospitalization) 10/05/2016 Creatinine 1.28, BUN 30, Potassium 3.8, Sodium 140, EGFR 53-60 (during hospitalization) 07/28/2016 Creatinine 0.98, BUN 12, Potassium 4.5, Sodium 141  05/16/2016 Creatinine 1.09, BUN 24, Potassium 4.1, Sodium 140 04/28/2016 Creatinine 1.79, BUN 33, Potassium 4.6, Sodium 140  12/08/2015 Creatinine 1.00, BUN 16, Potassium 4.2, Sodium 141  11/10/2015 Creatinine 1.05, BUN 14, Potassium 3.8, Sodium 140   Recommendations: No changes. Reminded to limit dietary salt intake to 2000 mg/day and fluid intake to < 2 liters/day. Encouraged to call for fluid symptoms.  Follow-up plan: ICM clinic phone appointment on 12/26/2016.  Copy of ICM check sent to device physician.   3 month ICM trend: 11/25/2016   1 Year ICM trend:      Rosalene Billings, RN 11/25/2016 12:09 PM

## 2016-11-25 NOTE — Telephone Encounter (Signed)
Attempted to confirm remote transmission with pt. No answer and was unable to leave a message.   

## 2016-12-01 DIAGNOSIS — E7800 Pure hypercholesterolemia, unspecified: Secondary | ICD-10-CM | POA: Diagnosis not present

## 2016-12-01 DIAGNOSIS — M159 Polyosteoarthritis, unspecified: Secondary | ICD-10-CM | POA: Diagnosis not present

## 2016-12-01 DIAGNOSIS — J449 Chronic obstructive pulmonary disease, unspecified: Secondary | ICD-10-CM | POA: Diagnosis not present

## 2016-12-01 DIAGNOSIS — I2510 Atherosclerotic heart disease of native coronary artery without angina pectoris: Secondary | ICD-10-CM | POA: Diagnosis not present

## 2016-12-06 DIAGNOSIS — E039 Hypothyroidism, unspecified: Secondary | ICD-10-CM | POA: Diagnosis not present

## 2016-12-08 DIAGNOSIS — E039 Hypothyroidism, unspecified: Secondary | ICD-10-CM | POA: Diagnosis not present

## 2016-12-08 DIAGNOSIS — Z299 Encounter for prophylactic measures, unspecified: Secondary | ICD-10-CM | POA: Diagnosis not present

## 2016-12-08 DIAGNOSIS — J449 Chronic obstructive pulmonary disease, unspecified: Secondary | ICD-10-CM | POA: Diagnosis not present

## 2016-12-08 DIAGNOSIS — Z713 Dietary counseling and surveillance: Secondary | ICD-10-CM | POA: Diagnosis not present

## 2016-12-08 DIAGNOSIS — Z6822 Body mass index (BMI) 22.0-22.9, adult: Secondary | ICD-10-CM | POA: Diagnosis not present

## 2016-12-08 DIAGNOSIS — I10 Essential (primary) hypertension: Secondary | ICD-10-CM | POA: Diagnosis not present

## 2016-12-29 DIAGNOSIS — S20219A Contusion of unspecified front wall of thorax, initial encounter: Secondary | ICD-10-CM | POA: Diagnosis not present

## 2016-12-29 DIAGNOSIS — Z6821 Body mass index (BMI) 21.0-21.9, adult: Secondary | ICD-10-CM | POA: Diagnosis not present

## 2016-12-29 DIAGNOSIS — Z299 Encounter for prophylactic measures, unspecified: Secondary | ICD-10-CM | POA: Diagnosis not present

## 2016-12-29 DIAGNOSIS — R079 Chest pain, unspecified: Secondary | ICD-10-CM | POA: Diagnosis not present

## 2016-12-29 DIAGNOSIS — Z713 Dietary counseling and surveillance: Secondary | ICD-10-CM | POA: Diagnosis not present

## 2016-12-29 DIAGNOSIS — Z87891 Personal history of nicotine dependence: Secondary | ICD-10-CM | POA: Diagnosis not present

## 2016-12-29 DIAGNOSIS — J449 Chronic obstructive pulmonary disease, unspecified: Secondary | ICD-10-CM | POA: Diagnosis not present

## 2016-12-29 NOTE — Progress Notes (Signed)
No ICM remote transmission received for 12/26/2016 and next ICM transmission scheduled for 01/17/2017.

## 2017-01-02 ENCOUNTER — Encounter: Payer: Self-pay | Admitting: Pulmonary Disease

## 2017-01-02 ENCOUNTER — Ambulatory Visit (INDEPENDENT_AMBULATORY_CARE_PROVIDER_SITE_OTHER): Payer: Medicare Other | Admitting: Pulmonary Disease

## 2017-01-02 VITALS — BP 118/60 | HR 68 | Temp 98.2°F | Ht 69.0 in | Wt 133.2 lb

## 2017-01-02 DIAGNOSIS — Z86711 Personal history of pulmonary embolism: Secondary | ICD-10-CM | POA: Insufficient documentation

## 2017-01-02 DIAGNOSIS — Z950 Presence of cardiac pacemaker: Secondary | ICD-10-CM | POA: Insufficient documentation

## 2017-01-02 DIAGNOSIS — I272 Pulmonary hypertension, unspecified: Secondary | ICD-10-CM | POA: Diagnosis not present

## 2017-01-02 DIAGNOSIS — R937 Abnormal findings on diagnostic imaging of other parts of musculoskeletal system: Secondary | ICD-10-CM | POA: Diagnosis not present

## 2017-01-02 DIAGNOSIS — I255 Ischemic cardiomyopathy: Secondary | ICD-10-CM | POA: Diagnosis not present

## 2017-01-02 DIAGNOSIS — J439 Emphysema, unspecified: Secondary | ICD-10-CM | POA: Diagnosis not present

## 2017-01-02 DIAGNOSIS — I061 Rheumatic aortic insufficiency: Secondary | ICD-10-CM | POA: Diagnosis not present

## 2017-01-02 DIAGNOSIS — J432 Centrilobular emphysema: Secondary | ICD-10-CM

## 2017-01-02 DIAGNOSIS — I2510 Atherosclerotic heart disease of native coronary artery without angina pectoris: Secondary | ICD-10-CM

## 2017-01-02 DIAGNOSIS — I5022 Chronic systolic (congestive) heart failure: Secondary | ICD-10-CM | POA: Diagnosis not present

## 2017-01-02 DIAGNOSIS — R918 Other nonspecific abnormal finding of lung field: Secondary | ICD-10-CM | POA: Diagnosis not present

## 2017-01-02 DIAGNOSIS — J984 Other disorders of lung: Secondary | ICD-10-CM | POA: Diagnosis not present

## 2017-01-02 MED ORDER — TIOTROPIUM BROMIDE MONOHYDRATE 18 MCG IN CAPS: 18.0000 ug | ORAL_CAPSULE | Freq: Every day | RESPIRATORY_TRACT | 11 refills | Status: DC

## 2017-01-02 NOTE — Progress Notes (Signed)
Subjective:    Patient ID: [name], male    DOB: October 18, 1940, 76 y.o.   MRN: [id number]  HPI   OLDER DATA in Epic>>   CTChest 12/29/10 showed borderline cardiomeg, pacer device on left, no adenopathy, extensive centrilobular emphysema, atx or scarring at left base, NAD...   Last CXR 02/08/11 showed cardiomeg, AICD/pacer on left, hyperinflated lungs, clear, NAD...  ~  December 14, 2015:  Initial pulmonary evaluation by SN>  His PCP is DrVyas in [place], [place] and his Cardiologist is Medical sales representative...    76 y/o WM referred for pulmonary evaluation due to dyspnea>  [name] is an ex-smoker having started in his teens, smoked for 40 yrs up to 1ppd and says he quit in the early 2000's when he had an MI;  He denies hx lung problems as a young man- no hx asthma, bronchitis, pneumonia, etc; he has had a mild cough, not much sput, no hemoptysis; he developed SOB/DOE over the last 10 yrs and sl progressive by his history; we do not have records from his PCP but he has been on BREO but only using it prn w/ minimal benefit=> he requested a pulmonary evaluation to see if we could help w/ his dyspnea... He has mult somatic complaints- sinus problems, can't breathe thru is nose well, etc; he gets choked occas when eating & his GI is in [place], we do not have records...     He was Adm 12/10/15 for CATH> known CAD w/ remote PCI to the RCA, ischemic cardiomyopathy, CHF, hx of VT w/ BiV-ICD in place; Cath showed patent stent in distal RCA, & min CAD w/ 20% midRCA and 30%proxCIRC, norm LVF (EF=55-65% & no wall motion abnormalities), mild pulmHTN (PAsys=44) w/ norm filling pressures... Since his increased dyspnea was not related to cardiac issue he requested pulmonary evaluation...  Smoking Hx>  40 pack-yr hx and quit in early 2000s...  Pulmonary Hx>  He had a remote PE (details unknown) & was on Coumadin in the past; not prev told about COPD but was started on Breo prn last yr...  Medical Hx>  Hx CAD, AI,  cardiomyopathy w/ prev LV dysfunction, hx VTach w/ ICD placed, HL, hx thyroid cancer w/ surg~2000,   Family Hx>  3 brothers have all had lung cancer!  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  Current Meds> Breo (only using prn), ASA81, Coreg25Bid, Mavik3mg /d, Lasix20-3/d, Inspra25/d, Simva20, Niacin500, Synthroid112, Prilosec40 EXAM shows Afeb, VSS, O2sat=96% on RA at rest;  HEENT- neg, mallapmpati1;  Chest- decr BS bilat w/o w/r/r;  Heart- RR w/o m/r/g heard;  Abd- soft, nontender, neg;  Ext- neg w/o c/c/e;  Neuro- intact...   CXR 12/13/14 showed norm heart size, tortuous thor Ao, AICD device on left, 1.2cm nodular density in LLL (not seen on last film 2012), otherw clear lungs...   Spirometry 12/13/14>  FVC=1.74 (41%), FEV1=0.86 (26%), %1sec=49%, mid-flows reduced at 12% predicted;  This is c/w severe obstructive ventilatory defect & GOLD Stage4 COPD...  Ambulatory Oximetry 12/13/14>  O2sat=93% on RA at rest w/ pulse=61;  He ambulated 2 Laps & stopped w/ dyspnea & nadir O2sat=88% w/ pulse=96/min...  LAB here 12/13/25>  Alpha-1-Antitrypsin level = 171 (83-199), and Phenotype= MM  CT Chest done 12/22/15> atherosclerotic vasc dis, AICD noted, prior thyroidectomy, severe emphysema & atx in lingula but NO LLL nodule seen (?prev artifact?), patchy interstitial & airsp opacities in RML & RLL w/ airway thickening & subsolid nodularity,  no adenopathy, end-plate compression T3, 70% compression T7 => needs BMD IMP >>     Severe COPD/ Emphysema w/ GOLD Stage 4 disease and severe dyspnea>      Ex-smoker, quit in 2000 w/ 40 pack-yr history    Mild pulmonary hypertension> PAsys=86mmHg on cath 11/2015    ? RUL pulm nodule on CXR, CT Chest w/o lesion seen    Hx pulmonary embolism> remote history, details are unknown to [place]...    CARDIAC issues>  CAD w/ prev PCI to RCA, ischemic cardiomyopathy, CHF, AI, Hx VT w/ BiV-ICD in place; followed by DrTaylor; pt states he had Rheumatic  fever as a child...    MEDICAL issues>  Pt w/ mult somatic complaints, HBP, HL, Hx thyroid ca & surg, Hx prostate cancer & surg, Compression fx T7 & T3; his PCP is DrVyas in [place]. PLAN >>     [name] has severe COPD/ emphysema w/ GOLD Stage 4 dis & FEV1=0.86 (26% predicted);  He is not on any regular breathing meds & we will start BREO once daily everyday, and INCRUSE once daily;  He needs exercise/ pulm rehab and we will pursue this in follow up;  He also has mild pulmHTN on his cath 11/2015 and he desaturates to 88% w/ ambulation=> therefore he needs HomeO2 portable system but he declines to start this now & we will have to ease him into the idea of using O2 and the benefits to his system;  He also c/o some dysphagia/ choking & I'm concerned about poss aspiration => we will arrange for a Barium Swallow & discussed te need to take the PPI 90min before dinner, NPO after dinner, Elev HOB 6", and f/u w/ his GI... we plan ROV in 6-8wks w/ FullPFTs to check LV & DLCO...  ~  February 04, 2016:  51mo ROV w/ SN>  60 y/o man w/ GOLD Stage 4 COPD (FEV1=0.86, 26% pred), ex-smoker quit in 2000, w/ SOB/DOE and mult somatic complaints; he has mild pulmHTN on Cath 11/2015 w/ PAsys=44 and he desaturated to 88% w/ ambulation (he declined to start HomeO2); CT Chest showed severe emphysema w/ some atx & scarring; he has known cardiac dis w/ prev PCI to the RCA, ischemic cardiomyopathy, CHF, AI, Hx VT w/ BiV-ICD in place; we were concerned about poss aspiration w/ c/o dysphagia/ choking & requested a Ba swallow but he cancelled the test; he is on BREO & INCRUSE, along w/ Omep40 & an antireflux regimen; we discussed the need for an exercise program & strongly encouraged Pulm Rehab program...  He states he could not tolerate the Incruse, so we had called in [place] but he never picked this med up due to cost;  We had a long discussion about the avail meds and limited options for rx!    Severe COPD/ Emphysema w/ GOLD Stage 4  disease and severe dyspnea>  On Breo alone, states "intol" to Incruse, never picked up the Spiriva; we discussed need for an anticholinergic & he will try the Spiriva Respimat- 2sp/d...    Ex-smoker, quit in 2000 w/ 40 pack-yr history    Mild pulmonary hypertension> PAsys=38mmHg on cath 11/2015, hehas declined to start Home O2 at this time & we will follow...    ? RUL pulm nodule on CXR, CT Chest w/o lesion seen    Hx pulmonary embolism> remote history, details are unknown to [place]...    CARDIAC issues>  CAD w/ prev PCI to RCA, ischemic cardiomyopathy, CHF, AI, Hx VT  w/ BiV-ICD in place=> changed to BiV pacer 7/17; followed by DrTaylor; pt states he had Rheumatic fever as a child...    MEDICAL issues>  Pt w/ mult somatic complaints, HBP, HL, Hx thyroid ca & surg, Hx prostate cancer & surg, Compression fx T7 & T3; his PCP is DrVyas in [place]. EXAM shows Afeb, VSS, O2sat=93% on RA at rest;  HEENT- neg, mallapmpati1;  Chest- decr BS bilat w/o w/r/r;  Heart- RR w/o m/r/g heard;  Abd- soft, nontender, neg;  Ext- neg w/o c/c/e;  Neuro- intact, anxious...   FullPFT 02/04/16>  FVC=2.99 (72%), FEV1=1.05 (35%), %1sec=35, mid-flows reduced at 11% predicted; post bronchodil there was a 19% incr in FEV1; TLC=7.52 (109%), RV=4.48 (181), RV/TLC=60%;  DLCO=27% predicted... This is c/w a severe obstructive ventilatory defect w/ air trapping, and a marked decr in DLCO c/w emphysema but there was also a signif reversible component post bronchodil... IMP/PLAN>>  I explained how he needs the ICS/LABA and Anticholinergic w/ his emphysema, + ProventilHFA rescue inhaler prn; this time he aslo needs home O2 but declines at present and PulmRehab (he will consider this)...  ~  July 04, 2016:  2mo ROV w/ SN>  [name] says he's breathing better & attributes the improvement to "short breathing thru my mouth"... 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     Severe COPD/ Emphysema w/ GOLD Stage 4 disease and severe dyspnea>  On Breo alone, states "intol" to Incruse, never picked up the Spiriva; we discussed need for an anticholinergic & he will try the Spiriva Respimat- 2sp/d (never got it- too $$);  Asked to try [place] one inhalation daily (sample provided)...    Ex-smoker, quit in 2000 w/ 40+ pack-yr history...    Mild pulmonary hypertension>  PAsys=12mmHg on cath 11/2015, prev 2DEcho 10/2015 est PAsys=66mmHg; he has declined to start Home O2 at this time & we will follow...    ? RUL pulm nodule on CXR, CT Chest w/o lesion seen    Hx pulmonary embolism> remote history, details are unknown to [place]... EXAM shows Afeb, VSS, O2sat=97% on RA at rest;  HEENT- neg, mallapmpati1;  Chest- decr BS bilat w/o w/r/r;  Heart- RR w/o m/r/g heard;  Abd- soft, nontender, neg;  Ext- neg w/o c/c/e;  Neuro- intact, anxious...   Ambulatory oximetry9/18/17>  O2sat=93% on RA at rest w/ pulse=108/min;  He walked 3 laps (185'@) in the office w/ lowest O2sat=87% w/ pulse=96/min... Rec to start O2 w/ exercise BUT HE REFUSES OXYGEN AT THIS TIME.  IMP/PLAN>>  In addition to the above prob list- [name] has mild exercise induced hypoxemia w/ desat to 87% but refuses to start O2 w/ exercise (he is very stoic 7 points to his excellent exercise tolerance;  I have again asked him to start a LAMA med & wrote for Caprice Renshaw one inhalation daily;  Continue Breo, cardiac meds, etc...    ~  January 02, 2017:  43mo ROV &  [name] indicates he's doing well,  no problems, breathing well... Once again he has stopped the LAMA medication (this time it was [place]) added on to his medical regimen stating "it bothered my eyes" & he doesn't care that it will help his breathing in the long run... [name] never even mentioned that he was seen in the [facility] ER 10/05/16 after an electric drill that he was working with at home suddenly jerked back & struck him in the sternum; he did not fall or sustain any other injury; He was tender over the mid-sternum w/ swelling; O2sat was 93% on RA at rest; CXR did not show any sternal fx; EKG showed an atrial sensed and ventric paced rhythm; He was treated w/ Norco prn...  He was subseq ADM 12/23 - 10/10/16 by [facility] w/ increased SOB, chest discomfort, cough, ?fever;  CXR showed a patchy RLL opac suspicious for pneumonia;  Sputum grew a Stentrophomonas Maltophilia sens to [place];  Blood cultures NEG;  CBC was OK but WBC=14K;  He was treated w/ Zithromax & Unasyn=> switched to oral Zithromax & Augmentin & he responded nicely... He followed up w/ DrVyas (PCP) & had a f/u CXR 11/07/16 showing resolution of the right basilar infiltrate...     Severe COPD/ Emphysema w/ GOLD Stage 4 disease; Hx pneumonia RLL 12.2017 (sput + for Stentrophomonas Maltophilia sens to Levaquin & Sulfa) after chest wall trauma from a drill>  On Breo alone, states "intol" to Incruse, we discussed need for an anticholinergic & he will try the Spiriva Respimat- 2sp/d (never got it- too $$);  Asked to try TUDORZA one inhalation daily (but that "affected my eyes"); we will now offer Spiriva Handihaler Rx...     Ex-smoker, quit in 2000 w/ 40+ pack-yr history...    Mild pulmonary hypertension>  PAsys=63mmHg on cath 11/2015, prev 2DEcho 10/2015 est PAsys=71mmHg; he has declined to start Home O2 at this time; on [place]...    ?RUL pulm nodule on CXR, CT Chest w/o lesion seen    Hx pulmonary embolism> remote history, details are unknown to [place]...    CARDIAC  issues>  CAD w/ prev PCI to RCA, ischemic cardiomyopathy, CHF, AI, Hx VT w/ BiV-ICD=> changed to BiV pacer 7/17; followed by DrTaylor; He has hx Rheumatic fever as a child...    MEDICAL issues>  Pt w/ mult somatic complaints, HBP, HL, Hx thyroid ca & surg, Hypothy, Hx prostate cancer & surg, Compression fx T7 & T3; his PCP is DrVyas in [place]. EXAM shows Afeb, VSS, O2sat=93% on RA at rest;  HEENT- neg, mallapmpati1;  Chest- decr BS bilat (unable to augment) w/o w/r/r;  Heart- RR w/o m/r/g heard;  Abd- soft, nontender, neg;  Ext- neg w/o c/c/e;  Neuro- intact, anxious...   CXR 09/2016 showed borderline cardiomeg, Ao calcification, COPD/ emphysema, right basilar opac c/w RLL pneumonia...  CXR 11/07/16>  boderline cardiomeg, Ao calcif, biventric pacer on left, COPD, mild scarring bilat 7 resolved RLL infiltrate, compression fxs mid Tspine  EKG 10/08/16>  Atrial sensed, ventirc paced rhythm IMP/PLAN>>  [name] has severe COPD/ emphysema; he recovered from his RLL pneumonia 09/2016 w/o sequelae (his oxygenation was adeq & he did not require O2 at discharge); we discussed again the benefit of a LAMA medication for his lung dis & rec trial of Spiriva Handihaler (we are running out of options); I explained that this dual therapy might help him stay off of oxygen longer... He will call for any questions and we  plan rov in 40mo...    Past Medical History:  Diagnosis Date  . AICD (automatic cardioverter/defibrillator) present   . Aortic insufficiency     moderate aortic ins moderate/asymptomatic/normal LV cavity size.   . Carotid artery disease ([place])     less than 50% stenosis bilaterally  . Drug-induced gynecomastia     secondary to spironolactone  . Dyslipidemia   . ICD (implantable cardiac defibrillator), biventricular, in situ     Medtronic model  [id number]  . Ischemic cardiomyopathy     status post non-ST elevation microinfarction stent to the right coronary artery is a   . LV dysfunction     NYHA  class I/prior ejection fraction 20-25% and an ejection fraction 7 2009 50-55%, ejection fraction 60% bedside echocardiogram July 2012   . PONV (postoperative nausea and vomiting)   . Pulmonary embolism (H[facility])    Prior history of pulmonary embolism off Coumadin.  . Ventricular tachycardia ([place])    Inducible ventricular polymorphic tachycardia status post ICD followed by upgrade with CRT-D 2007, battery change at 01/31/2011    Past Surgical History:  Procedure Laterality Date  . CARDIAC CATHETERIZATION    . CARDIAC CATHETERIZATION N/A 12/10/2015   Procedure: Right/Left Heart Cath and Coronary Angiography;  Surgeon: Peter M Martinique, MD;  Location: [facility];  Service: Cardiovascular;  Laterality: N/A;  . CARDIAC DEFIBRILLATOR PLACEMENT     medtronic, remote - yes  . CATARACT EXTRACTION W/PHACO Left 08/25/2014   Procedure: CATARACT EXTRACTION PHACO AND INTRAOCULAR LENS PLACEMENT (IOC);  Surgeon: Tonny Branch, MD;  Location: [facility];  Service: Ophthalmology;  Laterality: Left;  CDE 5.79  . CATARACT EXTRACTION W/PHACO Right 09/18/2014   Procedure: CATARACT EXTRACTION PHACO AND INTRAOCULAR LENS PLACEMENT RIGHT EYE;  Surgeon: Tonny Branch, MD;  Location: [facility];  Service: Ophthalmology;  Laterality: Right;  CDE:3.35  . CORONARY STENT PLACEMENT    . EP IMPLANTABLE DEVICE N/A 05/03/2016   Procedure: BIV PPM Generator Changeout;  Surgeon: Evans Lance, MD;  Location: [facility];  Service: Cardiovascular;  Laterality: N/A;  . INGUINAL HERNIA REPAIR    . left breast biopsy    . RETROPUBIC PROSTATECTOMY    . THORACOTOMY     left  . THYROID SURGERY    . THYROIDECTOMY      Outpatient Encounter Prescriptions as of 01/02/2017  Medication Sig  . albuterol (PROVENTIL) (2.5 MG/3ML) 0.083% nebulizer solution Take 3 mLs (2.5 mg total) by nebulization every 6 (six) hours as needed for wheezing or shortness of breath.  Marland Kitchen aspirin 81 MG tablet Take 81 mg by mouth daily.   . calcium-vitamin D (OSCAL  WITH D) 500-200 MG-UNIT tablet Take 1 tablet by mouth daily.  . carvedilol (COREG) 25 MG tablet Take .5 (12.5 mg total) by mouth 2 (two) times daily with a meal.  . eplerenone (INSPRA) 25 MG tablet Take 1 tablet (25 mg total) by mouth daily.  . flecainide (TAMBOCOR) 50 MG tablet Take 1 tablet (50 mg) daily. May take additional tablet (50 mg) if heart is racing.  . fluticasone furoate-vilanterol (BREO ELLIPTA) 100-25 MCG/INH AEPB Inhale 1 puff into the lungs daily.  . furosemide (LASIX) 20 MG tablet Take 3 tablets (60 mg total) by mouth daily.  Marland Kitchen levothyroxine (SYNTHROID, LEVOTHROID) 137 MCG tablet Take 137 mcg by mouth daily before breakfast.  . Multiple Vitamins-Minerals (MULTIVITAMIN WITH MINERALS) tablet Take 1 tablet by mouth daily.  . niacin 500 MG tablet Take 500 mg by mouth daily  with breakfast.    . simvastatin (ZOCOR) 20 MG tablet Take 1 tablet (20 mg total) by mouth at bedtime.  . trandolapril (MAVIK) 2 MG tablet Take 3 mg by mouth daily.  Marland Kitchen amoxicillin-clavulanate (AUGMENTIN) 500-125 MG tablet Take 1 tablet (500 mg total) by mouth 3 (three) times daily. (Patient not taking: Reported on 01/02/2017)  . azithromycin (ZITHROMAX) 250 MG tablet Take 1 tablet (250 mg total) by mouth daily. (Patient not taking: Reported on 01/02/2017)  . HYDROcodone-acetaminophen (NORCO/VICODIN) 5-325 MG tablet Take 1 tablet by mouth every 4 (four) hours as needed. (Patient not taking: Reported on 01/02/2017)  . Hydrocortisone-Aloe Vera (CORTIZONE-10 PLUS EX) Apply 1 application topically as directed. Use as needed  . levothyroxine (SYNTHROID, LEVOTHROID) 112 MCG tablet Take 112 mcg by mouth daily before breakfast.  . ondansetron (ZOFRAN ODT) 4 MG disintegrating tablet Take 1 tablet (4 mg total) by mouth every 8 (eight) hours as needed for nausea. (Patient not taking: Reported on 01/02/2017)    No Known Allergies   Immunization History  Administered Date(s) Administered  . Influenza, High Dose Seasonal PF  10/01/2015, 08/29/2016  . Pneumococcal Polysaccharide-23 10/17/2013    Current Medications, Allergies, Past Medical History, Past Surgical History, Family History, and Social History were reviewed in [employer] record.   Review of Systems  Constitutional: Negative for fever and unexpected weight change.  HENT: Positive for congestion, sneezing and trouble swallowing. Negative for dental problem, ear pain, nosebleeds, postnasal drip, rhinorrhea, sinus pressure and sore throat.   Eyes: Negative for redness and itching.  Respiratory: Positive for shortness of breath. Negative for cough, chest tightness and wheezing.   Cardiovascular: Negative for palpitations and leg swelling.  Gastrointestinal: Negative for nausea and vomiting.  Genitourinary: Negative for dysuria.  Musculoskeletal: Positive for joint swelling.  Skin: Positive for rash ( itching).  Neurological: Negative for headaches.  Hematological: Does not bruise/bleed easily.  Psychiatric/Behavioral: Negative for dysphoric mood. The patient is not nervous/anxious.       Objective:   Physical Exam  Vital Signs:  Reviewed...   General:  WD, WN, 76 y/o WM in NAD; alert & oriented; pleasant & cooperative... HEENT:  [place]/AT; Conjunctiva- pink, Sclera- nonicteric, EOM-wnl, PERRLA, EACs-clear, TMs-wnl; NOSE-clear; THROAT-clear & wnl.  Neck:  Supple w/ fair ROM; no JVD; normal carotid impulses w/o bruits; no thyromegaly or nodules palpated; no lymphadenopathy.  Chest:  Decr BS bilat, clear to P & A; without wheezes, rales, or rhonchi heard. Heart:  Regular Rhythm; AICD on left; Gr 1/6 murmur, no rubs or gallops detected. Abdomen:  Soft & nontender- no guarding or rebound; normal bowel sounds; no organomegaly or masses palpated. Ext:  DecrROM; without deformities +arthritic changes; no varicose veins, venous insuffic, or edema;  Pulses intact w/o bruits. Neuro:  No focal neuro deficits; sensory testing normal; gait  normal & balance OK; he is anxious. Derm:  No lesions noted; no rash etc. Lymph:  No cervical, supraclavicular, axillary, or inguinal adenopathy palpated.     Assessment & Plan:    IMP >>     Severe COPD/ Emphysema w/ GOLD Stage 4 disease and severe dyspnea>      Ex-smoker, quit in 2000 w/ 40 pack-yr history    Mild pulmonary hypertension> PAsys=65mmHg on cath 11/2015    ? RUL pulm nodule on CXR, CT Chest w/o lesion seen    Hx pulmonary embolism> remote history, details are unknown to [place]...    CARDIAC issues>  CAD w/ prev PCI to RCA,  ischemic cardiomyopathy, CHF, AI, Hx VT w/ BiV-ICD in place; followed by DrTaylor; pt states he had Rheumatic fever as a child...    MEDICAL issues>  Pt w/ mult somatic complaints, HBP, HL, Hx thyroid ca & surg, Hx prostate cancer & surg, Compression fx T7 & T3; his PCP is DrVyas in [place].  PLAN >>  12/14/15>   [name] has severe COPD/ emphysema w/ GOLD Stage 4 dis & FEV1=0.86 (26% predicted);  He is not on any regular breathing meds & we will start BREO once daily everyday, and INCRUSE once daily;  He needs exercise/ pulm rehab and we will pursue this in follow up;  He also has mild pulmHTN on his cath 11/2015 and he desaturates to 88% w/ ambulation=> therefore he needs HomeO2 portable system but he declines to start this now & we will have to ease him into the idea of using O2 and the benefits to his system;  He also c/o some dysphagia/ choking & I'm concerned about poss aspiration => we will arrange for a Barium Swallow & discussed te need to take the PPI 62min before dinner, NPO after dinner, Elev HOB 6", and f/u w/ his GI... 4/20>   I explained how he needs the ICS/LABA and Anticholinergic w/ his emphysema, + ProventilHFA rescue inhaler prn; he aslo needs home O2 but declines at present and PulmRehab (he will consider this)... 07/04/16>   In addition to the above prob list- [name] has mild exercise induced hypoxemia w/ desat to 87% but refuses to start O2 w/  exercise (he is very stoic 7 points to his excellent exercise tolerance;  I have again asked him to start a LAMA med & wrote for Caprice Renshaw one inhalation daily;  Continue Breo, cardiac meds, etc. 01/02/17>   [name] has severe COPD/ emphysema; he recovered from his RLL pneumonia 09/2016 w/o sequelae (his oxygenation was adeq & he did not require O2 at discharge); we discussed again the benefit of a LAMA medication for his lung dis & rec trial of Spiriva Handihaler (we are running out of options); I explained that this dual therapy might help him stay off of oxygen longer...    Patient's Medications  New Prescriptions   TIOTROPIUM (SPIRIVA) 18 MCG INHALATION CAPSULE    Place 1 capsule (18 mcg total) into inhaler and inhale daily.  Previous Medications   ALBUTEROL (PROVENTIL) (2.5 MG/3ML) 0.083% NEBULIZER SOLUTION    Take 3 mLs (2.5 mg total) by nebulization every 6 (six) hours as needed for wheezing or shortness of breath.   ASPIRIN 81 MG TABLET    Take 81 mg by mouth daily.    CALCIUM-VITAMIN D (OSCAL WITH D) 500-200 MG-UNIT TABLET    Take 1 tablet by mouth daily.   CARVEDILOL (COREG) 25 MG TABLET    Take .5 (12.5 mg total) by mouth 2 (two) times daily with a meal.   EPLERENONE (INSPRA) 25 MG TABLET    Take 1 tablet (25 mg total) by mouth daily.   FLECAINIDE (TAMBOCOR) 50 MG TABLET    Take 1 tablet (50 mg) daily. May take additional tablet (50 mg) if heart is racing.   FLUTICASONE FUROATE-VILANTEROL (BREO ELLIPTA) 100-25 MCG/INH AEPB    Inhale 1 puff into the lungs daily.   FUROSEMIDE (LASIX) 20 MG TABLET    Take 3 tablets (60 mg total) by mouth daily.   HYDROCODONE-ACETAMINOPHEN (NORCO/VICODIN) 5-325 MG TABLET    Take 1 tablet by mouth every 4 (four) hours as needed.  HYDROCORTISONE-ALOE VERA (CORTIZONE-10 PLUS EX)    Apply 1 application topically as directed. Use as needed   LEVOTHYROXINE (SYNTHROID, LEVOTHROID) 137 MCG TABLET    Take 137 mcg by mouth daily before breakfast.   MULTIPLE  VITAMINS-MINERALS (MULTIVITAMIN WITH MINERALS) TABLET    Take 1 tablet by mouth daily.   NIACIN 500 MG TABLET    Take 500 mg by mouth daily with breakfast.     ONDANSETRON (ZOFRAN ODT) 4 MG DISINTEGRATING TABLET    Take 1 tablet (4 mg total) by mouth every 8 (eight) hours as needed for nausea.   SIMVASTATIN (ZOCOR) 20 MG TABLET    Take 1 tablet (20 mg total) by mouth at bedtime.   TRANDOLAPRIL (MAVIK) 2 MG TABLET    Take 3 mg by mouth daily.  Modified Medications   No medications on file  Discontinued Medications   No medications on file

## 2017-01-02 NOTE — Patient Instructions (Signed)
Today we updated your med list in our EPIC system...    Continue your current medications the same...  Let's try a different type of inhaler for his emphysema--   SPIRIVA HANDIHALER > inhale the contents of one ca[psule via the handihaler device daily...  Continue the BREO once daily...  Stay as active as possible and call for any increase shortness of breath...  Let's plan a follow up visit in 73mo, sooner if needed for any breathing problems.Marland KitchenMarland Kitchen

## 2017-01-17 ENCOUNTER — Telehealth: Payer: Self-pay

## 2017-01-17 ENCOUNTER — Ambulatory Visit (INDEPENDENT_AMBULATORY_CARE_PROVIDER_SITE_OTHER): Payer: Medicare Other

## 2017-01-17 DIAGNOSIS — I5022 Chronic systolic (congestive) heart failure: Secondary | ICD-10-CM | POA: Diagnosis not present

## 2017-01-17 DIAGNOSIS — Z950 Presence of cardiac pacemaker: Secondary | ICD-10-CM

## 2017-01-17 NOTE — Telephone Encounter (Signed)
Remote ICM transmission received.  Attempted patient call and left detailed message regarding transmission and next ICM scheduled for 03/24/2017.  Advised to return call for any fluid symptoms or questions.

## 2017-01-17 NOTE — Progress Notes (Signed)
EPIC Encounter for ICM Monitoring  Patient Name: [name] is a 76 y.o. male Date: 01/17/2017 Primary Care Physican: Glenda Chroman, MD Primary Cardiologist:Taylor Electrophysiologist: Druscilla Brownie Weight:unknown Bi-V Pacing:  98.4%   Clinical Status (25-Nov-2016 to 17-Jan-2017) V. Pacing 98.4%  AT/AF 48 episodes  Time in AT/AF 0.2 hr/day (0.7%)        Attempted call to patient and unable to reach.  Left detailed message regarding transmission.  Transmission reviewed.    Thoracic impedance normal.  Prescribed dosage: Furosemide 20 mg 3 tablets (60 mg total) daily.     Labs:  10/09/2016 Creatinine 1.03, BUN 22, Potassium 4.2, Sodium 140, EGFR >60 (during hospitalization) 10/08/2016 Creatinine 1.13, BUN 25, Potassium 4.0, Sodium 140, EGFR >60 (during hospitalization) 10/05/2016 Creatinine 1.28, BUN 30, Potassium 3.8, Sodium 140, EGFR 53-60 (during hospitalization) 07/28/2016 Creatinine 0.98, BUN 12, Potassium 4.5, Sodium 141  05/16/2016 Creatinine 1.09, BUN 24, Potassium 4.1, Sodium 140 04/28/2016 Creatinine 1.79, BUN 33, Potassium 4.6, Sodium 140  12/08/2015 Creatinine 1.00, BUN 16, Potassium 4.2, Sodium 141  11/10/2015 Creatinine 1.05, BUN 14, Potassium 3.8, Sodium 140   Recommendations: NONE - Unable to reach patient   Follow-up plan: ICM clinic phone appointment on 03/24/2017 since he has a defib office check scheduled on 02/21/2017 with Dr Lovena Le.  Copy of ICM check sent to device physician.   3 month ICM trend: 01/17/2017   1 Year ICM trend:      Rosalene Billings, RN 01/17/2017 8:11 AM

## 2017-01-30 DIAGNOSIS — J209 Acute bronchitis, unspecified: Secondary | ICD-10-CM | POA: Diagnosis not present

## 2017-01-30 DIAGNOSIS — I10 Essential (primary) hypertension: Secondary | ICD-10-CM | POA: Diagnosis not present

## 2017-01-30 DIAGNOSIS — Z6821 Body mass index (BMI) 21.0-21.9, adult: Secondary | ICD-10-CM | POA: Diagnosis not present

## 2017-01-30 DIAGNOSIS — J449 Chronic obstructive pulmonary disease, unspecified: Secondary | ICD-10-CM | POA: Diagnosis not present

## 2017-02-02 ENCOUNTER — Ambulatory Visit (INDEPENDENT_AMBULATORY_CARE_PROVIDER_SITE_OTHER): Payer: Self-pay

## 2017-02-02 ENCOUNTER — Telehealth: Payer: Self-pay

## 2017-02-02 DIAGNOSIS — Z950 Presence of cardiac pacemaker: Secondary | ICD-10-CM

## 2017-02-02 DIAGNOSIS — I5022 Chronic systolic (congestive) heart failure: Secondary | ICD-10-CM

## 2017-02-02 NOTE — Progress Notes (Signed)
EPIC Encounter for ICM Monitoring  Patient Name: [name] is a 76 y.o. male Date: 02/02/2017 Primary Care Physican: Glenda Chroman, MD Primary Cardiologist:Taylor Electrophysiologist: Druscilla Brownie Weight:unknown Bi-V Pacing:  99%    Clinical Status (17-Jan-2017 to 02-Feb-2017) V. Pacing 99.0%  AT/AF 33 episodes  Time in AT/AF 1.1 hr/day (4.7%)   Observations (1) (17-Jan-2017 to 02-Feb-2017)  1 days with more than 6 hr AT/AF.      Spoke with wife.  Heart Failure questions reviewed, pt symptomatic with shortness of breath.   Thoracic impedance abnormal suggesting fluid accumulation since 01/22/2017.  Will have device RN review transmission today for any changes.   Prescribed dosage: Furosemide 20 mg 3 tablets (60 mg total) daily.     Labs:  10/25/2016 Creatinine 1.12, BUN 16, Potassium 4.4, Sodium 142, EGFR 63-73 10/09/2016 Creatinine 1.03, BUN 22, Potassium 4.2, Sodium 140, EGFR >60 (during hospitalization) 10/08/2016 Creatinine 1.13, BUN 25, Potassium 4.0, Sodium 140, EGFR >60 (during hospitalization) 10/05/2016 Creatinine 1.28, BUN 30, Potassium 3.8, Sodium 140, EGFR 53-60 (during hospitalization) 07/28/2016 Creatinine 0.98, BUN 12, Potassium 4.5, Sodium 141  05/16/2016 Creatinine 1.09, BUN 24, Potassium 4.1, Sodium 140 04/28/2016 Creatinine 1.79, BUN 33, Potassium 4.6, Sodium 140  12/08/2015 Creatinine 1.00, BUN 16, Potassium 4.2, Sodium 141  11/10/2015 Creatinine 1.05, BUN 14, Potassium 3.8, Sodium 140   Recommendations:  Increase Furosemide to 60 mg twice a day x 3 days and after 3rd day return to Furosemide 60 mg daily.   She verbalized understanding.  Advised he his breathing worsens over the weekend to use emergency room if needed.   Follow-up plan: ICM clinic phone appointment on 02/07/2017.  Office appointment scheduled on 02/21/2017 with Dr Lovena Le.  Copy of ICM check sent to device physician.   3 month ICM trend: 02/02/2017        AT/AF   1 Year ICM  trend:      Rosalene Billings, RN 02/02/2017 8:39 AM

## 2017-02-02 NOTE — Telephone Encounter (Signed)
Returned call to wife as requested by voice mail.  She said patient is having shortness of breath and would like to send remote transmission for review.  Advised to send and will call her back after reviewed.

## 2017-02-06 DIAGNOSIS — E039 Hypothyroidism, unspecified: Secondary | ICD-10-CM | POA: Diagnosis not present

## 2017-02-07 ENCOUNTER — Ambulatory Visit (INDEPENDENT_AMBULATORY_CARE_PROVIDER_SITE_OTHER): Payer: Self-pay

## 2017-02-07 DIAGNOSIS — Z950 Presence of cardiac pacemaker: Secondary | ICD-10-CM

## 2017-02-07 DIAGNOSIS — I5022 Chronic systolic (congestive) heart failure: Secondary | ICD-10-CM

## 2017-02-07 NOTE — Progress Notes (Signed)
EPIC Encounter for ICM Monitoring  Patient Name: [name] is a 76 y.o. male Date: 02/07/2017 Primary Care Physican: Glenda Chroman, MD Primary Cardiologist:Taylor Electrophysiologist: Druscilla Brownie Weight:unknown Bi-V Pacing: 97.1%       Spoke with wife. Heart Failure questions reviewed, pt breathing has returned to baseline   Thoracic impedance returned to normal after increasing Furosemide to 60 mg twice a day x 3 days.  Prescribed dosage: Furosemide 20 mg 3 tablets (60 mg total) daily.   Labs:  10/25/2016 Creatinine 1.12, BUN 16, Potassium 4.4, Sodium 142, EGFR 63-73 10/09/2016 Creatinine 1.03, BUN 22, Potassium 4.2, Sodium 140, EGFR >60 (during hospitalization) 10/08/2016 Creatinine 1.13, BUN 25, Potassium 4.0, Sodium 140, EGFR >60 (during hospitalization) 10/05/2016 Creatinine 1.28, BUN 30, Potassium 3.8, Sodium 140, EGFR 53-60 (during hospitalization) 07/28/2016 Creatinine 0.98, BUN 12, Potassium 4.5, Sodium 141  05/16/2016 Creatinine 1.09, BUN 24, Potassium 4.1, Sodium 140 04/28/2016 Creatinine 1.79, BUN 33, Potassium 4.6, Sodium 140  12/08/2015 Creatinine 1.00, BUN 16, Potassium 4.2, Sodium 141  11/10/2015 Creatinine 1.05, BUN 14, Potassium 3.8, Sodium 140   Recommendations: No changes. Advised to limit salt intake to 2000 mg/day and fluid intake to < 2 liters/day.  Encouraged to call for fluid symptoms.  Follow-up plan: ICM clinic phone appointment on 03/24/2017 since he has a pacemaker office appointment scheduled on 5/8/2018with Dr Lovena Le.  Copy of ICM check sent to physician.   3 month ICM trend: 02/07/2017   1 Year ICM trend:      Rosalene Billings, RN 02/07/2017 9:35 AM

## 2017-02-08 ENCOUNTER — Other Ambulatory Visit: Payer: Self-pay | Admitting: Internal Medicine

## 2017-02-21 ENCOUNTER — Ambulatory Visit (INDEPENDENT_AMBULATORY_CARE_PROVIDER_SITE_OTHER): Payer: Medicare Other | Admitting: Internal Medicine

## 2017-02-21 ENCOUNTER — Encounter: Payer: Self-pay | Admitting: Internal Medicine

## 2017-02-21 DIAGNOSIS — I5022 Chronic systolic (congestive) heart failure: Secondary | ICD-10-CM | POA: Diagnosis not present

## 2017-02-21 DIAGNOSIS — I255 Ischemic cardiomyopathy: Secondary | ICD-10-CM

## 2017-02-21 MED ORDER — TRANDOLAPRIL 2 MG PO TABS: 2.0000 mg | ORAL_TABLET | Freq: Every day | ORAL | 3 refills | Status: DC

## 2017-02-21 MED ORDER — FLECAINIDE ACETATE 50 MG PO TABS: ORAL_TABLET | ORAL | 3 refills | Status: DC

## 2017-02-21 MED ORDER — CARVEDILOL 6.25 MG PO TABS: 6.2500 mg | ORAL_TABLET | Freq: Two times a day (BID) | ORAL | 3 refills | Status: DC

## 2017-02-21 NOTE — Patient Instructions (Addendum)
Medication Instructions:  Your physician has recommended you make the following change in your medication:  1) DECREASE Carvedilol to 6.25 mg twice daily 2) DECREASE Mavik to 2 mg once daily  3) INCREASE Flecainide to 1 tablet (50 mg) in the morning and 1/2 tablet (25 mg) in the evening   Labwork: BMET    Testing/Procedures: None ordered   Follow-Up: Your physician wants you to follow-up in: 6 months with Dr. Lovena Le. You will receive a reminder letter in the mail two months in advance. If you don't receive a letter, please call our office to schedule the follow-up appointment.  Remote monitoring is used to monitor your Pacemaker from home. This monitoring reduces the number of office visits required to check your device to one time per year. It allows [place] to keep an eye on the functioning of your device to ensure it is working properly. You are scheduled for a device check from home on 05/23/17. You may send your transmission at any time that day. If you have a wireless device, the transmission will be sent automatically. After your physician reviews your transmission, you will receive a postcard with your next transmission date.     Any Other Special Instructions Will Be Listed Below (If Applicable).     If you need a refill on your cardiac medications before your next appointment, please call your pharmacy.

## 2017-02-21 NOTE — Progress Notes (Signed)
HPI [name] returns today for followup. He is a pleasant 76 yo man with a h/o an ICM, chronic systolic CHF, s/p BiV ICD implant. He has had severe LV dysfunction since 2005 but did have improvement in 2009. He has severe COPD. He underwent ICD removal and insertion of a BiV PPM several months ago. In the interim, he has had periods were he felt poorly. He does not feel palpitations. The patient had a heart cath less than a year ago where he was found to have only mild non-obstructive disease and normal LV function with no segmental wall motion abnormalities.   When I saw him several weeks ago he was feeling bad and was in atrial tachycardia at 150/min. We paced him back to NSR. He was started on low dose flecainide. He thinks he could not tolerated bid flecainide and we reduced his dose to daily. Interogation of his PPM reveals breakthrough episoides of atrial tachy. He has increased his diuretic but labs have not been obtained.   No Known Allergies   Current Outpatient Prescriptions  Medication Sig Dispense Refill  . aspirin 81 MG tablet Take 81 mg by mouth daily.     . calcium-vitamin D (OSCAL WITH D) 500-200 MG-UNIT tablet Take 1 tablet by mouth daily.    . carvedilol (COREG) 25 MG tablet Take .5 (12.5 mg total) by mouth 2 (two) times daily with a meal. 180 tablet 3  . eplerenone (INSPRA) 25 MG tablet Take 1 tablet (25 mg total) by mouth daily. 90 tablet 3  . flecainide (TAMBOCOR) 50 MG tablet Take 1 tablet (50 mg) daily. May take additional tablet (50 mg) if heart is racing. 120 tablet 2  . fluticasone furoate-vilanterol (BREO ELLIPTA) 100-25 MCG/INH AEPB Inhale 1 puff into the lungs daily. 60 each 11  . furosemide (LASIX) 20 MG tablet TAKE 3 TABLETS BY MOUTH EVERY DAY    . levothyroxine (SYNTHROID, LEVOTHROID) 125 MCG tablet Take 125 mcg by mouth daily before breakfast.    . Multiple Vitamins-Minerals (MULTIVITAMIN WITH MINERALS) tablet Take 1 tablet by mouth daily.    . niacin 500 MG  tablet Take 500 mg by mouth daily with breakfast.      . simvastatin (ZOCOR) 20 MG tablet Take 1 tablet (20 mg total) by mouth at bedtime. 90 tablet 3  . trandolapril (MAVIK) 2 MG tablet Take 3 mg by mouth daily.     No current facility-administered medications for this visit.      Past Medical History:  Diagnosis Date  . AICD (automatic cardioverter/defibrillator) present   . Aortic insufficiency     moderate aortic ins moderate/asymptomatic/normal LV cavity size.   . Carotid artery disease ([facility])     less than 50% stenosis bilaterally  . Drug-induced gynecomastia     secondary to spironolactone  . Dyslipidemia   . ICD (implantable cardiac defibrillator), biventricular, in situ     Medtronic model  [id number]  . Ischemic cardiomyopathy     status post non-ST elevation microinfarction stent to the right coronary artery is a   . LV dysfunction     NYHA class I/prior ejection fraction 20-25% and an ejection fraction 7 2009 50-55%, ejection fraction 60% bedside echocardiogram July 2012   . PONV (postoperative nausea and vomiting)   . Pulmonary embolism ([facility])    Prior history of pulmonary embolism off Coumadin.  . Ventricular tachycardia ([place])    Inducible ventricular polymorphic tachycardia status post ICD followed by upgrade with CRT-D 2007, battery change  at 01/31/2011    ROS:   All systems reviewed and negative except as noted in the HPI.   Past Surgical History:  Procedure Laterality Date  . CARDIAC CATHETERIZATION    . CARDIAC CATHETERIZATION N/A 12/10/2015   Procedure: Right/Left Heart Cath and Coronary Angiography;  Surgeon: Peter M Martinique, MD;  Location: [facility];  Service: Cardiovascular;  Laterality: N/A;  . CARDIAC DEFIBRILLATOR PLACEMENT     medtronic, remote - yes  . CATARACT EXTRACTION W/PHACO Left 08/25/2014   Procedure: CATARACT EXTRACTION PHACO AND INTRAOCULAR LENS PLACEMENT (IOC);  Surgeon: Tonny Branch, MD;  Location: [facility];  Service: Ophthalmology;   Laterality: Left;  CDE 5.79  . CATARACT EXTRACTION W/PHACO Right 09/18/2014   Procedure: CATARACT EXTRACTION PHACO AND INTRAOCULAR LENS PLACEMENT RIGHT EYE;  Surgeon: Tonny Branch, MD;  Location: [facility];  Service: Ophthalmology;  Laterality: Right;  CDE:3.35  . CORONARY STENT PLACEMENT    . EP IMPLANTABLE DEVICE N/A 05/03/2016   Procedure: BIV PPM Generator Changeout;  Surgeon: Evans Lance, MD;  Location: [facility];  Service: Cardiovascular;  Laterality: N/A;  . INGUINAL HERNIA REPAIR    . left breast biopsy    . RETROPUBIC PROSTATECTOMY    . THORACOTOMY     left  . THYROID SURGERY    . THYROIDECTOMY       Family History  Problem Relation Age of Onset  . Diabetes type I Sister     daughter   . Lung cancer Brother     several types of cancer  . Lung cancer Brother     several types of cancer  . Lung cancer Brother   . Clotting disorder Neg Hx     also no repiratory disease   . Prostate cancer Neg Hx      Social History   Social History  . Marital status: Married    Spouse name: N/A  . Number of children: 2  . Years of education: N/A   Occupational History  . plumber - self employed Retired   Social History Main Topics  . Smoking status: Former Smoker    Packs/day: 1.00    Years: 43.00    Types: Cigarettes    Start date: 10/17/1957    Quit date: 12/13/2000  . Smokeless tobacco: Never Used     Comment: started at age 68  . Alcohol use No  . Drug use: No  . Sexual activity: Not Currently   Other Topics Concern  . Not on file   Social History Narrative   Self employed Development worker, community; 2 daughters.      BP 100/60   Pulse 62   Ht 5\' 9"  (1.753 m)   Wt 133 lb 3.2 oz (60.4 kg)   SpO2 95%   BMI 19.67 kg/m  P- 150 by me Physical Exam:  stable appearing 76 yo man, NAD HEENT: Unremarkable Neck:  6 cm JVD, no thyromegally Back:  No CVA tenderness Lungs:  Clear with no wheezes, well healed ICD incision. Reduced lung sounds. HEART:  Regular tachy rhythm, no  murmurs, no rubs, no clicks Abd:  soft, positive bowel sounds, no organomegally, no rebound, no guarding Ext:  2 plus pulses, no edema, no cyanosis, no clubbing Skin:  No rashes no nodules Neuro:  CN II through XII intact, motor grossly intac  DEVICE  Normal device function.  See PaceArt for details. Underlying rhythm is nsr today  Assess/Plan: 1. BiV PPM - his device is working normally.  Will recheck in several months. 2. Chronic systolic heart failure - his EF has nearly normalized. His symptoms have recently worsened which correlate with his SVT. He is encouraged to maintain a low salt diet. We will check labs today to be sure he is not being overdiuresed. I have asked him to reduce his dose of coreg and Mavik. 3. COPD - he will continue his bronchodilators. 4. SVT - he is in NSR today. Review of his PPM demonstrates episodes of SVT. We discussed going back to 50 mg bid but he thinks he cannot tolerate so we will try 50/25.   Mikle Bosworth.D.

## 2017-02-22 LAB — BASIC METABOLIC PANEL
BUN / CREAT RATIO: 19 (ref 10–24)
BUN: 19 mg/dL (ref 8–27)
CO2: 28 mmol/L (ref 18–29)
Calcium: 9.2 mg/dL (ref 8.6–10.2)
Chloride: 100 mmol/L (ref 96–106)
Creatinine, Ser: 1.02 mg/dL (ref 0.76–1.27)
GFR calc Af Amer: 83 mL/min/{1.73_m2} (ref >59)
GFR calc non Af Amer: 72 mL/min/{1.73_m2} (ref >59)
GLUCOSE: 72 mg/dL (ref 65–99)
POTASSIUM: 4 mmol/L (ref 3.5–5.2)
SODIUM: 147 mmol/L — AB (ref 134–144)

## 2017-02-27 ENCOUNTER — Telehealth: Payer: Self-pay | Admitting: Internal Medicine

## 2017-02-27 NOTE — Telephone Encounter (Signed)
New message   Pt wife is calling about blood work pt had done. She is asking for results.

## 2017-02-27 NOTE — Telephone Encounter (Signed)
I spoke with pt's wife, reviewed results of BMET done 02/21/17, she is aware that Dr Lovena Le has not reviewed yet.

## 2017-03-06 DIAGNOSIS — J209 Acute bronchitis, unspecified: Secondary | ICD-10-CM | POA: Diagnosis not present

## 2017-03-06 DIAGNOSIS — I10 Essential (primary) hypertension: Secondary | ICD-10-CM | POA: Diagnosis not present

## 2017-03-06 DIAGNOSIS — Z299 Encounter for prophylactic measures, unspecified: Secondary | ICD-10-CM | POA: Diagnosis not present

## 2017-03-06 DIAGNOSIS — I5023 Acute on chronic systolic (congestive) heart failure: Secondary | ICD-10-CM | POA: Diagnosis not present

## 2017-03-06 DIAGNOSIS — J449 Chronic obstructive pulmonary disease, unspecified: Secondary | ICD-10-CM | POA: Diagnosis not present

## 2017-03-06 DIAGNOSIS — E7800 Pure hypercholesterolemia, unspecified: Secondary | ICD-10-CM | POA: Diagnosis not present

## 2017-03-06 DIAGNOSIS — I2510 Atherosclerotic heart disease of native coronary artery without angina pectoris: Secondary | ICD-10-CM | POA: Diagnosis not present

## 2017-03-06 DIAGNOSIS — E039 Hypothyroidism, unspecified: Secondary | ICD-10-CM | POA: Diagnosis not present

## 2017-03-06 DIAGNOSIS — Z6821 Body mass index (BMI) 21.0-21.9, adult: Secondary | ICD-10-CM | POA: Diagnosis not present

## 2017-03-06 DIAGNOSIS — K219 Gastro-esophageal reflux disease without esophagitis: Secondary | ICD-10-CM | POA: Diagnosis not present

## 2017-03-24 ENCOUNTER — Ambulatory Visit (INDEPENDENT_AMBULATORY_CARE_PROVIDER_SITE_OTHER): Payer: Medicare Other

## 2017-03-24 ENCOUNTER — Telehealth: Payer: Self-pay

## 2017-03-24 DIAGNOSIS — I5022 Chronic systolic (congestive) heart failure: Secondary | ICD-10-CM

## 2017-03-24 DIAGNOSIS — Z950 Presence of cardiac pacemaker: Secondary | ICD-10-CM | POA: Diagnosis not present

## 2017-03-24 NOTE — Telephone Encounter (Signed)
ICM call to wife.  Requested patient send ICM remote transmission today to check fluid levels.  She said he will send in about an hour.

## 2017-03-24 NOTE — Progress Notes (Signed)
EPIC Encounter for ICM Monitoring  Patient Name: [name] is a 76 y.o. male Date: 03/24/2017 Primary Care Physican: Glenda Chroman, MD Primary Cardiologist:Taylor Electrophysiologist: Druscilla Brownie Weight:unknown Bi-V Pacing: 99.8%      Spoke with wife. Heart Failure questions reviewed, pt asymptomatic   Thoracic impedance normal   Prescribed dosage: Furosemide 20 mg 3 tablets (60 mg total) daily.   Labs:  02/22/2017 Creatinine 1.02, BUN 19, Potassium 4.0, Sodium 147, EGFR 72-83 10/25/2016 Creatinine 1.12, BUN 16, Potassium 4.4, Sodium 142, EGFR 63-73 10/09/2016 Creatinine 1.03, BUN 22, Potassium 4.2, Sodium 140, EGFR >60 (during hospitalization) 10/08/2016 Creatinine 1.13, BUN 25, Potassium 4.0, Sodium 140, EGFR >60 (during hospitalization) 10/05/2016 Creatinine 1.28, BUN 30, Potassium 3.8, Sodium 140, EGFR 53-60 (during hospitalization) 07/28/2016 Creatinine 0.98, BUN 12, Potassium 4.5, Sodium 141  05/16/2016 Creatinine 1.09, BUN 24, Potassium 4.1, Sodium 140 04/28/2016 Creatinine 1.79, BUN 33, Potassium 4.6, Sodium 140  12/08/2015 Creatinine 1.00, BUN 16, Potassium 4.2, Sodium 141  11/10/2015 Creatinine 1.05, BUN 14, Potassium 3.8, Sodium 140   Recommendations: No changes.  Encouraged to call for fluid symptoms.  Follow-up plan: ICM clinic phone appointment on 04/24/2017.    Copy of ICM check sent to device physician.   3 month ICM trend: 03/24/2017   1 Year ICM trend:      Rosalene Billings, RN 03/24/2017 10:33 AM

## 2017-04-13 DIAGNOSIS — Z6821 Body mass index (BMI) 21.0-21.9, adult: Secondary | ICD-10-CM | POA: Diagnosis not present

## 2017-04-13 DIAGNOSIS — Z299 Encounter for prophylactic measures, unspecified: Secondary | ICD-10-CM | POA: Diagnosis not present

## 2017-04-13 DIAGNOSIS — M25561 Pain in right knee: Secondary | ICD-10-CM | POA: Diagnosis not present

## 2017-04-24 ENCOUNTER — Ambulatory Visit (INDEPENDENT_AMBULATORY_CARE_PROVIDER_SITE_OTHER): Payer: Medicare Other

## 2017-04-24 DIAGNOSIS — I5022 Chronic systolic (congestive) heart failure: Secondary | ICD-10-CM

## 2017-04-24 DIAGNOSIS — Z950 Presence of cardiac pacemaker: Secondary | ICD-10-CM

## 2017-04-24 NOTE — Progress Notes (Signed)
EPIC Encounter for ICM Monitoring  Patient Name: [name] is a 76 y.o. male Date: 04/24/2017 Primary Care Physican: Glenda Chroman, MD Primary Cardiologist:Taylor Electrophysiologist: Druscilla Brownie Weight:unknown Bi-V Pacing: 99.8%                                                  Spoke with wife. Heart Failure questions reviewed, pt asymptomatic.  Does not consistently weigh.  Advised to weigh daily after emptying bladder with same clothes on daily to monitor for fluid weight which is 3 pounds overnight and 5 pounds in a week  Thoracic impedance normal   Prescribed dosage: Furosemide 20 mg 3 tablets (60 mg total) daily.   Labs:  02/22/2017 Creatinine 1.02, BUN 19, Potassium 4.0, Sodium 147, EGFR 72-83 10/25/2016 Creatinine 1.12, BUN 16, Potassium 4.4, Sodium 142, EGFR 63-73 10/09/2016 Creatinine 1.03, BUN 22, Potassium 4.2, Sodium 140, EGFR >60 (during hospitalization) 10/08/2016 Creatinine 1.13, BUN 25, Potassium 4.0, Sodium 140, EGFR >60 (during hospitalization) 10/05/2016 Creatinine 1.28, BUN 30, Potassium 3.8, Sodium 140, EGFR 53-60 (during hospitalization) 07/28/2016 Creatinine 0.98, BUN 12, Potassium 4.5, Sodium 141  05/16/2016 Creatinine 1.09, BUN 24, Potassium 4.1, Sodium 140 04/28/2016 Creatinine 1.79, BUN 33, Potassium 4.6, Sodium 140  12/08/2015 Creatinine 1.00, BUN 16, Potassium 4.2, Sodium 141  11/10/2015 Creatinine 1.05, BUN 14, Potassium 3.8, Sodium 140   Recommendations: No changes.  Advised to limit salt intake to 2000 mg/day and fluid intake to < 2 liters/day.  Encouraged to call for fluid symptoms.  Follow-up plan: ICM clinic phone appointment on 05/25/2017.   Copy of ICM check sent to device physician.   3 month ICM trend: 04/24/2017   1 Year ICM trend:      Rosalene Billings, RN 04/24/2017 10:49 AM

## 2017-05-05 DIAGNOSIS — Z6821 Body mass index (BMI) 21.0-21.9, adult: Secondary | ICD-10-CM | POA: Diagnosis not present

## 2017-05-05 DIAGNOSIS — M7989 Other specified soft tissue disorders: Secondary | ICD-10-CM | POA: Diagnosis not present

## 2017-05-05 DIAGNOSIS — I10 Essential (primary) hypertension: Secondary | ICD-10-CM | POA: Diagnosis not present

## 2017-05-05 DIAGNOSIS — M25561 Pain in right knee: Secondary | ICD-10-CM | POA: Diagnosis not present

## 2017-05-05 DIAGNOSIS — E039 Hypothyroidism, unspecified: Secondary | ICD-10-CM | POA: Diagnosis not present

## 2017-05-05 DIAGNOSIS — J439 Emphysema, unspecified: Secondary | ICD-10-CM | POA: Diagnosis not present

## 2017-05-05 DIAGNOSIS — J449 Chronic obstructive pulmonary disease, unspecified: Secondary | ICD-10-CM | POA: Diagnosis not present

## 2017-05-05 DIAGNOSIS — M79604 Pain in right leg: Secondary | ICD-10-CM | POA: Diagnosis not present

## 2017-05-05 DIAGNOSIS — M25461 Effusion, right knee: Secondary | ICD-10-CM | POA: Diagnosis not present

## 2017-05-05 DIAGNOSIS — Z299 Encounter for prophylactic measures, unspecified: Secondary | ICD-10-CM | POA: Diagnosis not present

## 2017-05-05 DIAGNOSIS — R600 Localized edema: Secondary | ICD-10-CM | POA: Diagnosis not present

## 2017-05-06 IMAGING — NM NM MISC PROCEDURE
4 series · 24 of 24 positions shown · non-contrast
Comparison: none

[Series 1: wbr_r-proj_st rest · 6.40mm/px · 6 of 64 frames shown]
[frame 6/64]
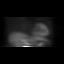
[frame 16/64]
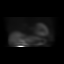
[frame 27/64]
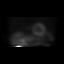
[frame 38/64]
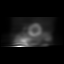
[frame 48/64]
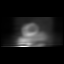
[frame 59/64]
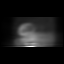

[Series 1: stress · 6.40mm/px · 6 of 64 frames shown]
[frame 6/64]
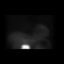
[frame 16/64]
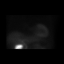
[frame 27/64]
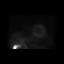
[frame 38/64]
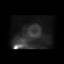
[frame 48/64]
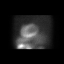
[frame 59/64]
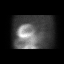

[Series 1: wbr_s-proj_st stress · 6.40mm/px · 6 of 64 frames shown]
[frame 6/64]
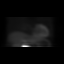
[frame 16/64]
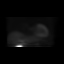
[frame 27/64]
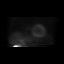
[frame 38/64]
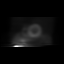
[frame 48/64]
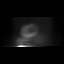
[frame 59/64]
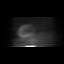

[Series 1: rest · 6.40mm/px · 6 of 64 frames shown]
[frame 6/64]
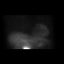
[frame 16/64]
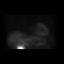
[frame 27/64]
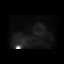
[frame 38/64]
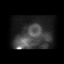
[frame 48/64]
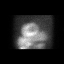
[frame 59/64]
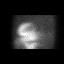

[24 of 24 positions shown; findings below may reference images not displayed]

Canned report from images found in remote index.

Refer to host system for actual result text.

## 2017-05-09 DIAGNOSIS — H43812 Vitreous degeneration, left eye: Secondary | ICD-10-CM | POA: Diagnosis not present

## 2017-05-09 DIAGNOSIS — D3132 Benign neoplasm of left choroid: Secondary | ICD-10-CM | POA: Diagnosis not present

## 2017-05-09 DIAGNOSIS — H43393 Other vitreous opacities, bilateral: Secondary | ICD-10-CM | POA: Diagnosis not present

## 2017-05-09 DIAGNOSIS — H04123 Dry eye syndrome of bilateral lacrimal glands: Secondary | ICD-10-CM | POA: Diagnosis not present

## 2017-05-11 ENCOUNTER — Ambulatory Visit (INDEPENDENT_AMBULATORY_CARE_PROVIDER_SITE_OTHER): Payer: Self-pay

## 2017-05-11 ENCOUNTER — Telehealth: Payer: Self-pay

## 2017-05-11 DIAGNOSIS — Z950 Presence of cardiac pacemaker: Secondary | ICD-10-CM

## 2017-05-11 DIAGNOSIS — I5022 Chronic systolic (congestive) heart failure: Secondary | ICD-10-CM

## 2017-05-11 NOTE — Telephone Encounter (Signed)
Returned call to wife as requested by voice mail message. Patient has had gout in one foot which has been swollen. He has chest congestion and weight has increased from 130.5 on 7/23 to 135 lbs yesterday and today is 133 lbs.  Advised to send remote transmission and will call her back and reviewed.  See ICM note for report.

## 2017-05-11 NOTE — Progress Notes (Signed)
EPIC Encounter for ICM Monitoring  Patient Name: [name] is a 76 y.o. male Date: 05/11/2017 Primary Care Physican: Glenda Chroman, MD Primary Cardiologist:Taylor Electrophysiologist: Druscilla Brownie Weight:unknown Bi-V Pacing: 99.8%  Monitored since Since 24-Apr-2017 VT (>4 beats) 1 AT/AF 7 and longest 5 hr Time in AT/AF 0.4 hr/day (1.6%)      Wife called reporting patient has gained 5 pounds this week but has lost 2 of the 5 lbs since yesterday, also has chest congestions and some swelling in feet.    Thoracic impedance abnormal suggesting fluid accumulation but starting to trend back toward baseline.  Prescribed dosage: Furosemide 20 mg 3 tablets (60 mg total) daily.   Labs:  02/22/2017 Creatinine 1.02, BUN 19, Potassium 4.0, Sodium 147, EGFR 72-83 10/25/2016 Creatinine 1.12, BUN 16, Potassium 4.4, Sodium 142, EGFR 63-73 10/09/2016 Creatinine 1.03, BUN 22, Potassium 4.2, Sodium 140, EGFR >60 (during hospitalization) 10/08/2016 Creatinine 1.13, BUN 25, Potassium 4.0, Sodium 140, EGFR >60 (during hospitalization) 10/05/2016 Creatinine 1.28, BUN 30, Potassium 3.8, Sodium 140, EGFR 53-60 (during hospitalization) 07/28/2016 Creatinine 0.98, BUN 12, Potassium 4.5, Sodium 141  05/16/2016 Creatinine 1.09, BUN 24, Potassium 4.1, Sodium 140 04/28/2016 Creatinine 1.79, BUN 33, Potassium 4.6, Sodium 140  12/08/2015 Creatinine 1.00, BUN 16, Potassium 4.2, Sodium 141  11/10/2015 Creatinine 1.05, BUN 14, Potassium 3.8, Sodium 140   Recommendations:  Advised to increase Furosemide 20 mg to take 3 tablets in am and 2 tablets in PM up to 2 days.  Advised if weight returns to baseline and chest congestion is resolved after 1st day then return to prescribed dosage of 3 tablets daily.    Follow-up plan: ICM clinic phone appointment on 05/16/2017 to recheck fluid levels.    Copy of ICM check sent to Dr Lovena Le.   3 month ICM trend: 05/11/2017   1 Year ICM trend:      Rosalene Billings, RN 05/11/2017 10:37 AM

## 2017-05-16 ENCOUNTER — Ambulatory Visit (INDEPENDENT_AMBULATORY_CARE_PROVIDER_SITE_OTHER): Payer: Self-pay

## 2017-05-16 DIAGNOSIS — M2241 Chondromalacia patellae, right knee: Secondary | ICD-10-CM | POA: Diagnosis not present

## 2017-05-16 DIAGNOSIS — M7121 Synovial cyst of popliteal space [Baker], right knee: Secondary | ICD-10-CM | POA: Diagnosis not present

## 2017-05-16 DIAGNOSIS — I5022 Chronic systolic (congestive) heart failure: Secondary | ICD-10-CM

## 2017-05-16 DIAGNOSIS — M25561 Pain in right knee: Secondary | ICD-10-CM | POA: Diagnosis not present

## 2017-05-16 DIAGNOSIS — Z950 Presence of cardiac pacemaker: Secondary | ICD-10-CM

## 2017-05-16 DIAGNOSIS — S83241A Other tear of medial meniscus, current injury, right knee, initial encounter: Secondary | ICD-10-CM | POA: Diagnosis not present

## 2017-05-16 NOTE — Progress Notes (Signed)
EPIC Encounter for ICM Monitoring  Patient Name: [name] is a 76 y.o. male Date: 05/16/2017 Primary Care Physican: Glenda Chroman, MD Primary Cardiologist:Taylor Electrophysiologist: Lovena Le Dry Weight:134.5 lbs Bi-V Pacing: 99.7%      Spoke with wife.  Heart Failure questions reviewed, patient's congestion improved and weight remains up by 2-3 lbs.   Thoracic impedance improved after taking extra Furosemide x 2 days but remains just below baseline suggesting fluid accumulation.  Prescribed dosage: Furosemide 20 mg 3 tablets (60 mg total) daily.   Labs:  02/22/2017 Creatinine 1.02, BUN 19, Potassium 4.0, Sodium 147, EGFR 72-83 10/25/2016 Creatinine 1.12, BUN 16, Potassium 4.4, Sodium 142, EGFR 63-73 10/09/2016 Creatinine 1.03, BUN 22, Potassium 4.2, Sodium 140, EGFR >60 (during hospitalization) 10/08/2016 Creatinine 1.13, BUN 25, Potassium 4.0, Sodium 140, EGFR >60 (during hospitalization) 10/05/2016 Creatinine 1.28, BUN 30, Potassium 3.8, Sodium 140, EGFR 53-60 (during hospitalization) 07/28/2016 Creatinine 0.98, BUN 12, Potassium 4.5, Sodium 141  05/16/2016 Creatinine 1.09, BUN 24, Potassium 4.1, Sodium 140 04/28/2016 Creatinine 1.79, BUN 33, Potassium 4.6, Sodium 140  12/08/2015 Creatinine 1.00, BUN 16, Potassium 4.2, Sodium 141  11/10/2015 Creatinine 1.05, BUN 14, Potassium 3.8, Sodium 140   Recommendations: No changes.  Advised to limit salt intake to 2000 mg/day and fluid intake to < 2 liters/day.  Advised to call if he has weight gain, congestion, swelling or SOB.   Follow-up plan: ICM clinic phone appointment on 05/25/2017.    Copy of ICM check sent to Dr Lovena Le.   3 month ICM trend: 05/16/2017   1 Year ICM trend:      Rosalene Billings, RN 05/16/2017 8:36 AM

## 2017-05-19 DIAGNOSIS — M2241 Chondromalacia patellae, right knee: Secondary | ICD-10-CM | POA: Diagnosis not present

## 2017-05-19 DIAGNOSIS — S83241A Other tear of medial meniscus, current injury, right knee, initial encounter: Secondary | ICD-10-CM | POA: Diagnosis not present

## 2017-05-19 DIAGNOSIS — M25561 Pain in right knee: Secondary | ICD-10-CM | POA: Diagnosis not present

## 2017-05-19 DIAGNOSIS — M7121 Synovial cyst of popliteal space [Baker], right knee: Secondary | ICD-10-CM | POA: Diagnosis not present

## 2017-05-25 ENCOUNTER — Telehealth: Payer: Self-pay

## 2017-05-25 ENCOUNTER — Ambulatory Visit (INDEPENDENT_AMBULATORY_CARE_PROVIDER_SITE_OTHER): Payer: Medicare Other | Admitting: *Deleted

## 2017-05-25 DIAGNOSIS — Z950 Presence of cardiac pacemaker: Secondary | ICD-10-CM | POA: Diagnosis not present

## 2017-05-25 DIAGNOSIS — I255 Ischemic cardiomyopathy: Secondary | ICD-10-CM | POA: Diagnosis not present

## 2017-05-25 DIAGNOSIS — I5022 Chronic systolic (congestive) heart failure: Secondary | ICD-10-CM | POA: Diagnosis not present

## 2017-05-25 NOTE — Progress Notes (Signed)
EPIC Encounter for ICM Monitoring  Patient Name: [name] is a 76 y.o. male Date: 05/25/2017 Primary Care Physican: Glenda Chroman, MD Primary Cardiologist:Taylor Electrophysiologist: Druscilla Brownie Weight:Previous ICM weight 134.5 lbs Bi-V Pacing: 98.9%       Attempted call to wife and unable to reach.  Left detailed message regarding transmission.  Transmission reviewed.    Thoracic impedance normal today but was abnormal suggesting fluid accumulation from 7/19 to 8/8.  Prescribed dosage: Furosemide 20 mg 3 tablets (60 mg total) daily.   Labs:  02/22/2017 Creatinine 1.02, BUN 19, Potassium 4.0, Sodium 147, EGFR 72-83 10/25/2016 Creatinine 1.12, BUN 16, Potassium 4.4, Sodium 142, EGFR 63-73 10/09/2016 Creatinine 1.03, BUN 22, Potassium 4.2, Sodium 140, EGFR >60 (during hospitalization) 10/08/2016 Creatinine 1.13, BUN 25, Potassium 4.0, Sodium 140, EGFR >60 (during hospitalization) 10/05/2016 Creatinine 1.28, BUN 30, Potassium 3.8, Sodium 140, EGFR 53-60 (during hospitalization) 07/28/2016 Creatinine 0.98, BUN 12, Potassium 4.5, Sodium 141  05/16/2016 Creatinine 1.09, BUN 24, Potassium 4.1, Sodium 140 04/28/2016 Creatinine 1.79, BUN 33, Potassium 4.6, Sodium 140  12/08/2015 Creatinine 1.00, BUN 16, Potassium 4.2, Sodium 141  Recommendations: Left voice mail with ICM number and encouraged to call for fluid symptoms.  Follow-up plan: ICM clinic phone appointment on 06/26/2017.    Copy of ICM check sent to device physician.   3 month ICM trend: 05/25/2017   1 Year ICM trend:      Rosalene Billings, RN 05/25/2017 10:03 AM

## 2017-05-25 NOTE — Telephone Encounter (Signed)
Remote ICM transmission received.  Attempted patient call and left detailed message regarding transmission and next ICM scheduled for 06/26/2017.  Advised to return call for any fluid symptoms or questions.

## 2017-05-26 DIAGNOSIS — M25561 Pain in right knee: Secondary | ICD-10-CM | POA: Diagnosis not present

## 2017-06-02 DIAGNOSIS — J449 Chronic obstructive pulmonary disease, unspecified: Secondary | ICD-10-CM | POA: Diagnosis not present

## 2017-06-02 DIAGNOSIS — I2510 Atherosclerotic heart disease of native coronary artery without angina pectoris: Secondary | ICD-10-CM | POA: Diagnosis not present

## 2017-06-02 DIAGNOSIS — M159 Polyosteoarthritis, unspecified: Secondary | ICD-10-CM | POA: Diagnosis not present

## 2017-06-02 DIAGNOSIS — E7800 Pure hypercholesterolemia, unspecified: Secondary | ICD-10-CM | POA: Diagnosis not present

## 2017-06-06 ENCOUNTER — Telehealth: Payer: Self-pay

## 2017-06-06 ENCOUNTER — Ambulatory Visit (INDEPENDENT_AMBULATORY_CARE_PROVIDER_SITE_OTHER): Payer: Self-pay

## 2017-06-06 DIAGNOSIS — I5022 Chronic systolic (congestive) heart failure: Secondary | ICD-10-CM

## 2017-06-06 DIAGNOSIS — Z950 Presence of cardiac pacemaker: Secondary | ICD-10-CM

## 2017-06-06 DIAGNOSIS — E039 Hypothyroidism, unspecified: Secondary | ICD-10-CM | POA: Diagnosis not present

## 2017-06-06 DIAGNOSIS — M7121 Synovial cyst of popliteal space [Baker], right knee: Secondary | ICD-10-CM | POA: Diagnosis not present

## 2017-06-06 DIAGNOSIS — E7800 Pure hypercholesterolemia, unspecified: Secondary | ICD-10-CM | POA: Diagnosis not present

## 2017-06-06 DIAGNOSIS — J441 Chronic obstructive pulmonary disease with (acute) exacerbation: Secondary | ICD-10-CM | POA: Diagnosis not present

## 2017-06-06 DIAGNOSIS — I5023 Acute on chronic systolic (congestive) heart failure: Secondary | ICD-10-CM | POA: Diagnosis not present

## 2017-06-06 DIAGNOSIS — I2510 Atherosclerotic heart disease of native coronary artery without angina pectoris: Secondary | ICD-10-CM | POA: Diagnosis not present

## 2017-06-06 DIAGNOSIS — I10 Essential (primary) hypertension: Secondary | ICD-10-CM | POA: Diagnosis not present

## 2017-06-06 DIAGNOSIS — Z299 Encounter for prophylactic measures, unspecified: Secondary | ICD-10-CM | POA: Diagnosis not present

## 2017-06-06 DIAGNOSIS — M25561 Pain in right knee: Secondary | ICD-10-CM | POA: Diagnosis not present

## 2017-06-06 DIAGNOSIS — H8110 Benign paroxysmal vertigo, unspecified ear: Secondary | ICD-10-CM | POA: Diagnosis not present

## 2017-06-06 NOTE — Progress Notes (Signed)
Reviewed with Tommye Standard, PA.  Call back to wife and advised patient to take Furosemide 20 mg 3 tablets in morning and 2 tablets in evening x 3 days and go back to prior dosage.  Advised he should be taking Furosemide 60 mg daily for long term but she reported he is taking 40 mg twice a day.  Explained the swelling of one leg is not always related to the heart but will see if extra Furosemide will help.  Advised to have BMET drawn Friday, 06/09/2017.  Encouraged her to make an appointment with Dr Lovena Le to discuss Flecainide side effects such as the blurry vision.  Will recheck fluid levels 06/09/2017

## 2017-06-06 NOTE — Telephone Encounter (Addendum)
Returned call to wife as requested by voice mail message.  She reported patient has swelling in right foot to knee and no swelling in left leg. Orthopedic MD diagnosed a bakers cyst. Gout and blood clot were ruled out.  He has another appointment with Ortho physician today and she thinks they may draw fluid from the knee like they did in June. Weight increased in the last 3 days from 130.5 to 134.5.   He also complains of blurry vision but eye physician said there have not been any changes in his eyes or glasses. She stated at last visit with Dr Lovena Le, patient started on Flecainide and wondered if they could cause any leg swelling.  Requested to send a transmission for review.  She said she was not sure if they had time to send today and if not, will send on Thursday.

## 2017-06-06 NOTE — Telephone Encounter (Signed)
See ICM note for further information

## 2017-06-06 NOTE — Progress Notes (Signed)
EPIC Encounter for ICM Monitoring  Patient Name: [name] is a 76 y.o. male Date: 06/06/2017 Primary Care Physican: Vyas, Dhruv B, MD Primary Cardiologist:Taylor Electrophysiologist: Taylor Dry Weight:134.5 lbs Bi-V Pacing: 99%              Returned called to report patient has swelling in right foot to knee (no swelling in left leg). Orthopedic MD diagnosed a bakers cyst and ruled out blood clot and gout.  He has appointment with Ortho physician         today and she thinks they may draw fluid from the knee like they did in June. Weight increased in the last 3 days from 130.5 to 134.5.   He complains of blurry vision but eye physician said there have         not been any changes in his eyes or glasses. She stated at last visit with Dr Taylor, patient started on Flecainide and wondered if that could cause leg swelling and blurry vision.     Thoracic impedance abnormal suggesting fluid accumulation since 05/30/2017.  Prescribed dosage:  Furosemide 20 mg 3 tablets daily.  He has not been taking it as prescribed.  Wife stated he has been taking Furosemide 40 mg twice a day after last visit with Dr Taylor in May.   Labs:  02/22/2017 Creatinine 1.02, BUN 19, Potassium 4.0, Sodium 147, EGFR 72-83 10/25/2016 Creatinine 1.12, BUN 16, Potassium 4.4, Sodium 142, EGFR 63-73 10/09/2016 Creatinine 1.03, BUN 22, Potassium 4.2, Sodium 140, EGFR >60 (during hospitalization) 10/08/2016 Creatinine 1.13, BUN 25, Potassium 4.0, Sodium 140, EGFR >60 (during hospitalization) 10/05/2016 Creatinine 1.28, BUN 30, Potassium 3.8, Sodium 140, EGFR 53-60 (during hospitalization) 07/28/2016 Creatinine 0.98, BUN 12, Potassium 4.5, Sodium 141  05/16/2016 Creatinine 1.09, BUN 24, Potassium 4.1, Sodium 140 04/28/2016 Creatinine 1.79, BUN 33, Potassium 4.6, Sodium 140  12/08/2015 Creatinine 1.00, BUN 16, Potassium 4.2, Sodium 141  Recommendations:  Reviewed with Renee Ursuy, PA  Follow-up plan: ICM  clinic phone appointment on 06/09/2017.    Copy of ICM check sent to Dr Taylor.  3 month ICM trend: 06/06/2017   1 Year ICM trend:       S , RN 06/06/2017 2:32 PM    

## 2017-06-09 ENCOUNTER — Encounter: Payer: Self-pay | Admitting: Cardiology

## 2017-06-09 ENCOUNTER — Ambulatory Visit (INDEPENDENT_AMBULATORY_CARE_PROVIDER_SITE_OTHER): Payer: Medicare Other

## 2017-06-09 ENCOUNTER — Other Ambulatory Visit: Payer: Medicare Other

## 2017-06-09 ENCOUNTER — Telehealth: Payer: Self-pay

## 2017-06-09 DIAGNOSIS — M7121 Synovial cyst of popliteal space [Baker], right knee: Secondary | ICD-10-CM | POA: Diagnosis not present

## 2017-06-09 DIAGNOSIS — I5022 Chronic systolic (congestive) heart failure: Secondary | ICD-10-CM

## 2017-06-09 DIAGNOSIS — Z950 Presence of cardiac pacemaker: Secondary | ICD-10-CM

## 2017-06-09 NOTE — Progress Notes (Signed)
EPIC Encounter for ICM Monitoring  Patient Name: [name] is a 76 y.o. male Date: 06/09/2017 Primary Care Physican: Glenda Chroman, MD Primary Cardiologist:Taylor Electrophysiologist: Lovena Le Dry Weight:133.5 lbs Bi-V Pacing: 99%       Spoke with wife.  Heart Failure questions reviewed, pt lost 1 pound and had slight decrease in right leg swelling.  She report orthopedic surgeon advised to drain the bakers cyst today and thinks that could cause some of the leg/foot swelling.    Thoracic impedance remains abnormal suggesting fluid accumulation since taking increased Furosemide dosage of 20 mg 3 tablets in morning and 2 tablets in evening x 3 days.  Prescribed dosage: Furosemide 20 mg 3 tablets daily.   Not taking as prescribed and has been taking Furosemide 40 mg twice a day after last visit with Dr Lovena Le in May.   Labs:  02/22/2017 Creatinine 1.02, BUN 19, Potassium 4.0, Sodium 147, EGFR 72-83 10/25/2016 Creatinine 1.12, BUN 16, Potassium 4.4, Sodium 142, EGFR 63-73 10/09/2016 Creatinine 1.03, BUN 22, Potassium 4.2, Sodium 140, EGFR >60 (during hospitalization) 10/08/2016 Creatinine 1.13, BUN 25, Potassium 4.0, Sodium 140, EGFR >60 (during hospitalization) 10/05/2016 Creatinine 1.28, BUN 30, Potassium 3.8, Sodium 140, EGFR 53-60 (during hospitalization) 07/28/2016 Creatinine 0.98, BUN 12, Potassium 4.5, Sodium 141  05/16/2016 Creatinine 1.09, BUN 24, Potassium 4.1, Sodium 140 04/28/2016 Creatinine 1.79, BUN 33, Potassium 4.6, Sodium 140  12/08/2015 Creatinine 1.00, BUN 16, Potassium 4.2, Sodium 141  Recommendations: No changes.  Advised to limit salt intake and she reported she tries to find lowest sodium foods she can.  Patient is not able to get BMET drawn today as planned due to procedure today but will on Monday 8/27.   Follow-up plan: ICM clinic phone appointment on 06/15/2017 to recheck fluid levels.    Copy of ICM check sent to Dr. Lovena Le and Tommye Standard, PA.   3  month ICM trend: 06/09/2017   1 Year ICM trend:      Rosalene Billings, RN 06/09/2017 11:44 AM

## 2017-06-09 NOTE — Telephone Encounter (Signed)
LMOVM requesting that pt send manual transmission 

## 2017-06-09 NOTE — Progress Notes (Addendum)
Wife called and reported patient will have labs drawn on Monday because she is getting ready to take patient to have bakers cyst drained.  Update send to Tommye Standard, [place] regarding labs.

## 2017-06-12 ENCOUNTER — Other Ambulatory Visit: Payer: Medicare Other | Admitting: *Deleted

## 2017-06-12 DIAGNOSIS — I5022 Chronic systolic (congestive) heart failure: Secondary | ICD-10-CM

## 2017-06-13 LAB — CUP PACEART REMOTE DEVICE CHECK
Battery Remaining Longevity: 54 mo
Battery Voltage: 3 V
Brady Statistic AP VP Percent: 24.12 %
Brady Statistic AP VS Percent: 0.01 %
Brady Statistic AS VS Percent: 0.39 %
Implantable Lead Implant Date: 20050810
Implantable Lead Location: 753858
Implantable Lead Location: 753860
Implantable Lead Model: 4194
Implantable Lead Model: 5076
Implantable Pulse Generator Implant Date: 20170718
Lead Channel Impedance Value: 247 Ohm
Lead Channel Impedance Value: 532 Ohm
Lead Channel Impedance Value: 551 Ohm
Lead Channel Impedance Value: 608 Ohm
Lead Channel Pacing Threshold Amplitude: 0.5 V
Lead Channel Pacing Threshold Amplitude: 1.125 V
Lead Channel Sensing Intrinsic Amplitude: 11.375 mV
Lead Channel Sensing Intrinsic Amplitude: 5.625 mV
Lead Channel Sensing Intrinsic Amplitude: 5.625 mV
Lead Channel Setting Sensing Sensitivity: 2.8 mV
MDC IDC LEAD IMPLANT DT: 20050810
MDC IDC LEAD IMPLANT DT: 20071015
MDC IDC LEAD LOCATION: 753859
MDC IDC MSMT LEADCHNL LV IMPEDANCE VALUE: 418 Ohm
MDC IDC MSMT LEADCHNL LV IMPEDANCE VALUE: 703 Ohm
MDC IDC MSMT LEADCHNL RA IMPEDANCE VALUE: 475 Ohm
MDC IDC MSMT LEADCHNL RA PACING THRESHOLD PULSEWIDTH: 0.4 ms
MDC IDC MSMT LEADCHNL RV IMPEDANCE VALUE: 475 Ohm
MDC IDC MSMT LEADCHNL RV IMPEDANCE VALUE: 608 Ohm
MDC IDC MSMT LEADCHNL RV PACING THRESHOLD PULSEWIDTH: 0.4 ms
MDC IDC MSMT LEADCHNL RV SENSING INTR AMPL: 14.75 mV
MDC IDC SESS DTM: 20180809173820
MDC IDC SET LEADCHNL LV PACING AMPLITUDE: 3.5 V
MDC IDC SET LEADCHNL LV PACING PULSEWIDTH: 1 ms
MDC IDC SET LEADCHNL RA PACING AMPLITUDE: 1.5 V
MDC IDC SET LEADCHNL RV PACING AMPLITUDE: 2.5 V
MDC IDC SET LEADCHNL RV PACING PULSEWIDTH: 0.6 ms
MDC IDC STAT BRADY AS VP PERCENT: 75.48 %
MDC IDC STAT BRADY RA PERCENT PACED: 23.94 %
MDC IDC STAT BRADY RV PERCENT PACED: 98.92 %

## 2017-06-13 LAB — BASIC METABOLIC PANEL WITH GFR
BUN/Creatinine Ratio: 21 (ref 10–24)
BUN: 23 mg/dL (ref 8–27)
CO2: 28 mmol/L (ref 20–29)
Calcium: 8.9 mg/dL (ref 8.6–10.2)
Chloride: 97 mmol/L (ref 96–106)
Creatinine, Ser: 1.08 mg/dL (ref 0.76–1.27)
GFR calc Af Amer: 77 mL/min/{1.73_m2}
GFR calc non Af Amer: 67 mL/min/{1.73_m2}
Glucose: 111 mg/dL — ABNORMAL HIGH (ref 65–99)
Potassium: 4.1 mmol/L (ref 3.5–5.2)
Sodium: 141 mmol/L (ref 134–144)

## 2017-06-15 ENCOUNTER — Ambulatory Visit (INDEPENDENT_AMBULATORY_CARE_PROVIDER_SITE_OTHER): Payer: Medicare Other

## 2017-06-15 DIAGNOSIS — I5022 Chronic systolic (congestive) heart failure: Secondary | ICD-10-CM

## 2017-06-15 DIAGNOSIS — Z950 Presence of cardiac pacemaker: Secondary | ICD-10-CM

## 2017-06-15 NOTE — Progress Notes (Addendum)
EPIC Encounter for ICM Monitoring  Patient Name: [name] is a 76 y.o. male Date: 06/15/2017 Primary Care Physican: Glenda Chroman, MD Primary Cardiologist:Taylor Electrophysiologist: Lovena Le Dry Weight:133 lbs Bi-V Pacing: 99.1%         Spoke with wife. Heart Failure questions reviewed, pt asymptomatic.  Reviewed lab results from 06/12/2017 with wife.   Thoracic impedance returned to normal after taking extra Furosemide.   Prescribed dosage: Furosemide 20 mg 3 tablets daily.  Not taking as prescribed and has been taking Furosemide 40 mg twice a day after last visit with Dr Lovena Le in May.  Per lab results note 8/27, Tommye Standard, [place] approved that patient can take extra lasix for a couple of days periodically.   Labs:  06/12/2017 Creatinine 1.08, BUN 23, Potassium 4.1, Sodium 141, EGFR 67-77 02/22/2017 Creatinine 1.02, BUN 19, Potassium 4.0, Sodium 147, EGFR 72-83 10/25/2016 Creatinine 1.12, BUN 16, Potassium 4.4, Sodium 142, EGFR 63-73 10/09/2016 Creatinine 1.03, BUN 22, Potassium 4.2, Sodium 140, EGFR >60 (during hospitalization) 10/08/2016 Creatinine 1.13, BUN 25, Potassium 4.0, Sodium 140, EGFR >60 (during hospitalization) 10/05/2016 Creatinine 1.28, BUN 30, Potassium 3.8, Sodium 140, EGFR 53-60 (during hospitalization) 07/28/2016 Creatinine 0.98, BUN 12, Potassium 4.5, Sodium 141  05/16/2016 Creatinine 1.09, BUN 24, Potassium 4.1, Sodium 140 04/28/2016 Creatinine 1.79, BUN 33, Potassium 4.6, Sodium 140  12/08/2015 Creatinine 1.00, BUN 16, Potassium 4.2, Sodium 141  Recommendations: No changes.   Encouraged to call for fluid symptoms.  Follow-up plan: ICM clinic phone appointment on 06/26/2017. .  Copy of ICM check sent to Dr. Lovena Le.   3 month ICM trend: 06/15/2017   1 Year ICM trend:      Rosalene Billings, RN 06/15/2017 10:22 AM

## 2017-06-23 ENCOUNTER — Encounter: Payer: Self-pay | Admitting: Cardiology

## 2017-06-25 ENCOUNTER — Other Ambulatory Visit: Payer: Self-pay | Admitting: Internal Medicine

## 2017-06-26 ENCOUNTER — Ambulatory Visit (INDEPENDENT_AMBULATORY_CARE_PROVIDER_SITE_OTHER): Payer: Medicare Other

## 2017-06-26 DIAGNOSIS — I5022 Chronic systolic (congestive) heart failure: Secondary | ICD-10-CM | POA: Diagnosis not present

## 2017-06-26 DIAGNOSIS — Z950 Presence of cardiac pacemaker: Secondary | ICD-10-CM | POA: Diagnosis not present

## 2017-06-26 NOTE — Progress Notes (Signed)
EPIC Encounter for ICM Monitoring  Patient Name: [name] is a 76 y.o. male Date: 06/26/2017 Primary Care Physican: Vyas, Dhruv B, MD Primary Cardiologist:Taylor Electrophysiologist: Taylor Dry Weight:Previous weight 133 lbs Bi-V Pacing: 99.3%  Clinical Status (15-Jun-2017 to 26-Jun-2017) AT/AF 16 episodes  Time in AT/AF 0.7 hr/day (3.0%)      Attempted call to wife and left detail message.  Transmission reviewed   Thoracic impedance normal.  Prescribed dosage: Furosemide 20 mg 3 tablets daily. Not taking as prescribed and has been taking Furosemide 40 mg twice a day after last visit with Dr Taylor in May.  Per lab results note 8/27, Renee Ursuy, PA approved that patient can take extra lasix for a couple of days periodically.   Labs:  06/12/2017 Creatinine 1.08, BUN 23, Potassium 4.1, Sodium 141, EGFR 67-77 02/22/2017 Creatinine 1.02, BUN 19, Potassium 4.0, Sodium 147, EGFR 72-83 10/25/2016 Creatinine 1.12, BUN 16, Potassium 4.4, Sodium 142, EGFR 63-73 10/09/2016 Creatinine 1.03, BUN 22, Potassium 4.2, Sodium 140, EGFR >60 (during hospitalization) 10/08/2016 Creatinine 1.13, BUN 25, Potassium 4.0, Sodium 140, EGFR >60 (during hospitalization) 10/05/2016 Creatinine 1.28, BUN 30, Potassium 3.8, Sodium 140, EGFR 53-60 (during hospitalization) 07/28/2016 Creatinine 0.98, BUN 12, Potassium 4.5, Sodium 141  05/16/2016 Creatinine 1.09, BUN 24, Potassium 4.1, Sodium 140 04/28/2016 Creatinine 1.79, BUN 33, Potassium 4.6, Sodium 140  12/08/2015 Creatinine 1.00, BUN 16, Potassium 4.2, Sodium 141  Recommendations: Left voice mail with ICM number and encouraged to call for fluid symptoms.  Follow-up plan: ICM clinic phone appointment on 07/27/2017.    Copy of ICM check sent to Dr. Taylor.   3 month ICM trend: 06/26/2017      1 Year ICM trend:              S , RN 06/26/2017 9:10 AM    

## 2017-06-29 ENCOUNTER — Telehealth: Payer: Self-pay

## 2017-06-29 ENCOUNTER — Telehealth: Payer: Self-pay | Admitting: Internal Medicine

## 2017-06-29 NOTE — Telephone Encounter (Signed)
New message    Pt c/o swelling: STAT is pt has developed SOB within 24 hours  1. How long have you been experiencing swelling? Off and on  2. Where is the swelling located? Feet and ankles  3.  Are you currently taking a "fluid pill"? Yes    4.  Are you currently SOB? No   5.  Have you traveled recently? No

## 2017-06-29 NOTE — Telephone Encounter (Signed)
Returned call to wife as requested by voice mail message.  She reported patient is having pain in both feet and ankles.  She said the orthopedic physicians were not able to draw any fluid from bakers cyst and she is unsure what else can be done.  Patient has an appointment with Dr Woody Seller tomorrow for follow up.  She said patient has minimal swelling in his feet but has had an increase in pain in bottom of his feet.  She said she was unsure if patient should see Dr Lovena Le earlier than his appointment on 09/15/2017.  Advised to call office number if she wants to check if patient can get an earlier appointment or she can ask for appointment with PA or NP.  She prefers Dr Lovena Le. Advised to discuss his symptoms with PCP tomorrow.

## 2017-06-30 DIAGNOSIS — I2510 Atherosclerotic heart disease of native coronary artery without angina pectoris: Secondary | ICD-10-CM | POA: Diagnosis not present

## 2017-06-30 DIAGNOSIS — Z6834 Body mass index (BMI) 34.0-34.9, adult: Secondary | ICD-10-CM | POA: Diagnosis not present

## 2017-06-30 DIAGNOSIS — E1165 Type 2 diabetes mellitus with hyperglycemia: Secondary | ICD-10-CM | POA: Diagnosis not present

## 2017-06-30 DIAGNOSIS — Z6821 Body mass index (BMI) 21.0-21.9, adult: Secondary | ICD-10-CM | POA: Diagnosis not present

## 2017-06-30 DIAGNOSIS — Z299 Encounter for prophylactic measures, unspecified: Secondary | ICD-10-CM | POA: Diagnosis not present

## 2017-06-30 DIAGNOSIS — M25561 Pain in right knee: Secondary | ICD-10-CM | POA: Diagnosis not present

## 2017-06-30 DIAGNOSIS — I10 Essential (primary) hypertension: Secondary | ICD-10-CM | POA: Diagnosis not present

## 2017-06-30 NOTE — Telephone Encounter (Signed)
Call received from Pt.  Pt with increased edema to ankles and feet.  Order received from APP.   -Have Pt increase furosemide to 40 mg (2 tabs) by mouth twice daily for 3 days.  Then have Pt go back to normal dose ( 60 mg -3 tabs- by mouth daily)  Left detailed message per DPR with new medication instruction.  Left this nurse name and # for call back if any questions.

## 2017-07-05 ENCOUNTER — Ambulatory Visit: Payer: Medicare Other | Admitting: Pulmonary Disease

## 2017-07-17 ENCOUNTER — Telehealth: Payer: Self-pay | Admitting: Internal Medicine

## 2017-07-17 NOTE — Telephone Encounter (Signed)
Appt's made as requested.  No further action needed.

## 2017-07-17 NOTE — Telephone Encounter (Signed)
New message    Patient spouse Hassan Rowan states pt has swelling. Pt c/o swelling: STAT is pt has developed SOB within 24 hours  1) How much weight have you gained and in what time span? Since July  103-135lbs  2) If swelling, where is the swelling located? Both ankles and feet  3) Are you currently taking a fluid pill? yes  4) Are you currently SOB? "A little at this moment"  5) Do you have a log of your daily weights (if so, list)?     10/01-132.5lbs ,    9/28 131.5lbs,         9/27-132.5lbs    6) Have you gained 3 pounds in a day or 5 pounds in a week? no  7) Have you traveled recently? No   Offered several times to schedule appointment with PA- Hassan Rowan declined. Wants to be seen this week only with Dr Lovena Le, no appointment available .

## 2017-07-18 ENCOUNTER — Ambulatory Visit (INDEPENDENT_AMBULATORY_CARE_PROVIDER_SITE_OTHER): Payer: Medicare Other | Admitting: Physician Assistant

## 2017-07-18 VITALS — BP 118/78 | HR 64 | Ht 69.0 in | Wt 134.0 lb

## 2017-07-18 DIAGNOSIS — I5032 Chronic diastolic (congestive) heart failure: Secondary | ICD-10-CM | POA: Diagnosis not present

## 2017-07-18 DIAGNOSIS — I2510 Atherosclerotic heart disease of native coronary artery without angina pectoris: Secondary | ICD-10-CM

## 2017-07-18 DIAGNOSIS — I471 Supraventricular tachycardia: Secondary | ICD-10-CM | POA: Diagnosis not present

## 2017-07-18 DIAGNOSIS — Z79899 Other long term (current) drug therapy: Secondary | ICD-10-CM

## 2017-07-18 DIAGNOSIS — Z950 Presence of cardiac pacemaker: Secondary | ICD-10-CM | POA: Diagnosis not present

## 2017-07-18 DIAGNOSIS — I255 Ischemic cardiomyopathy: Secondary | ICD-10-CM

## 2017-07-18 MED ORDER — CARVEDILOL 12.5 MG PO TABS: 12.5000 mg | ORAL_TABLET | Freq: Two times a day (BID) | ORAL | 3 refills | Status: DC

## 2017-07-18 MED ORDER — FUROSEMIDE 20 MG PO TABS: 40.0000 mg | ORAL_TABLET | Freq: Two times a day (BID) | ORAL | 2 refills | Status: DC

## 2017-07-18 NOTE — Progress Notes (Signed)
Cardiology Office Note Date:  07/18/2017  Patient ID:  [name], [name] 04/21/1941, MRN [id number] PCP:  Glenda Chroman, MD  Cardiologist/Electrophysiologist: Dr. Lovena Le Pulmonary: Dr. Lenna Gilford    Chief Complaint:  Fluid retention  History of Present Illness: [name] is a 76 y.o. male with history of ICM, chronic systolic CHF w/ICD, CAD with PCI to RCA remotely, severe COPD.  He comes in today to be seen for Dr. Lovena Le.    Historically noted severe LV dysfunction since 2005 but did have improvement in 2009. His QRS duration had gradually increased over the past 6 years. He had c/o feeling fatigued, sob, and weak. At other times, he will have some chest pressure. Unclear whether exertion related or not. He is sedentary. He has not had an ICD shock. He denies medical or dietary compliance issues. With these symptoms he had a stress test which demonstrated intermediate risk, then a cath 12/10/15 which demonstrated no new obstruction, followed by a pulmonary referral which demonstrated severe COPD   He comes today to be seen for Dr. Lovena Le, last seen by him in May, at that time noted he had been having AT that was symptomatic, he was paced out of it at a prior visit and started on Flecainide, though intolerant of BID dosing and reduced to daily with breakthrough tachycardia, and he had self increased his diuretic as well his fluid OL felt 2/2 tachycardia. Pt was very resistent to 50mg  BID of flecainide and agreed to try 50AM, 25PM.  The patient is accompanied by his wife.  He reports at/since about the time of flecainide starting he has had increasing change in his vision, mostly with blurring of distance vision, he has seen his eye MD reportedly without change in his vision to explain this, his feet remain swollen despite increased lasix since seeing Dr. Lovena Le, and more so in the last few weeks with painful/tender skin 2/2 to the swelling.  Some component of DOE in the last few days, no rest  SOB, no symptoms of PND or orthopnea but trouble with restless sleep, difficulty falling asleep.  NO near syncope or syncope.  No dizzy spells   Device information:  MDT downgrade to CRT-P at his gen change 05/03/16, Dr. Lovena Le, [place] lead 07/31/06, original implant 05/26/04 NOTE: LV lead in in RV port for sensing.  RV lead (6949 lead) is in LV port with chronic high threshold    Past Medical History:  Diagnosis Date  . AICD (automatic cardioverter/defibrillator) present   . Aortic insufficiency     moderate aortic ins moderate/asymptomatic/normal LV cavity size.   . Carotid artery disease ([place])     less than 50% stenosis bilaterally  . Drug-induced gynecomastia     secondary to spironolactone  . Dyslipidemia   . ICD (implantable cardiac defibrillator), biventricular, in situ     Medtronic model  [id number]  . Ischemic cardiomyopathy     status post non-ST elevation microinfarction stent to the right coronary artery is a   . LV dysfunction     NYHA class I/prior ejection fraction 20-25% and an ejection fraction 7 2009 50-55%, ejection fraction 60% bedside echocardiogram July 2012   . PONV (postoperative nausea and vomiting)   . Pulmonary embolism (HCC)    Prior history of pulmonary embolism off Coumadin.  . Ventricular tachycardia ([place])    Inducible ventricular polymorphic tachycardia status post ICD followed by upgrade with CRT-D 2007, battery change at 01/31/2011    Past Surgical History:  Procedure Laterality Date  . CARDIAC CATHETERIZATION    . CARDIAC CATHETERIZATION N/A 12/10/2015   Procedure: Right/Left Heart Cath and Coronary Angiography;  Surgeon: Peter M Martinique, MD;  Location: [facility];  Service: Cardiovascular;  Laterality: N/A;  . CARDIAC DEFIBRILLATOR PLACEMENT     medtronic, remote - yes  . CATARACT EXTRACTION W/PHACO Left 08/25/2014   Procedure: CATARACT EXTRACTION PHACO AND INTRAOCULAR LENS PLACEMENT (IOC);  Surgeon: Tonny Branch, MD;  Location: [facility];  Service:  Ophthalmology;  Laterality: Left;  CDE 5.79  . CATARACT EXTRACTION W/PHACO Right 09/18/2014   Procedure: CATARACT EXTRACTION PHACO AND INTRAOCULAR LENS PLACEMENT RIGHT EYE;  Surgeon: Tonny Branch, MD;  Location: [facility];  Service: Ophthalmology;  Laterality: Right;  CDE:3.35  . CORONARY STENT PLACEMENT    . EP IMPLANTABLE DEVICE N/A 05/03/2016   Procedure: BIV PPM Generator Changeout;  Surgeon: Evans Lance, MD;  Location: [facility];  Service: Cardiovascular;  Laterality: N/A;  . INGUINAL HERNIA REPAIR    . left breast biopsy    . RETROPUBIC PROSTATECTOMY    . THORACOTOMY     left  . THYROID SURGERY    . THYROIDECTOMY      Current Outpatient Prescriptions  Medication Sig Dispense Refill  . aspirin 81 MG tablet Take 81 mg by mouth daily.     . calcium-vitamin D (OSCAL WITH D) 500-200 MG-UNIT tablet Take 1 tablet by mouth daily.    . carvedilol (COREG) 6.25 MG tablet Take 1 tablet (6.25 mg total) by mouth 2 (two) times daily. 180 tablet 3  . eplerenone (INSPRA) 25 MG tablet Take 1 tablet (25 mg total) by mouth daily. 90 tablet 3  . flecainide (TAMBOCOR) 50 MG tablet Take 50 mg (1 tablet) in the morning and 25 mg (1/2 tablet) in the evening. 180 tablet 3  . fluticasone furoate-vilanterol (BREO ELLIPTA) 100-25 MCG/INH AEPB Inhale 1 puff into the lungs daily. 60 each 11  . furosemide (LASIX) 20 MG tablet Take 3 tablets (60 mg total) by mouth daily. (Patient taking differently: Take 40 mg by mouth 2 (two) times daily. ) 270 tablet 2  . levothyroxine (SYNTHROID, LEVOTHROID) 125 MCG tablet Take 125 mcg by mouth daily before breakfast.    . Multiple Vitamins-Minerals (MULTIVITAMIN WITH MINERALS) tablet Take 1 tablet by mouth daily.    . simvastatin (ZOCOR) 20 MG tablet Take 1 tablet (20 mg total) by mouth at bedtime. 90 tablet 3  . trandolapril (MAVIK) 2 MG tablet Take 1 tablet (2 mg total) by mouth daily. 90 tablet 3   No current facility-administered medications for this visit.      Allergies:   Patient has no known allergies.   Social History:  The patient  reports that he quit smoking about 16 years ago. His smoking use included Cigarettes. He started smoking about 59 years ago. He has a 43.00 pack-year smoking history. He has never used smokeless tobacco. He reports that he does not drink alcohol or use drugs.   Family History:  The patient's family history includes Diabetes type I in his sister; Lung cancer in his brother, brother, and brother.  ROS:  Please see the history of present illness.  All other systems are reviewed and otherwise negative.   PHYSICAL EXAM:  VS:  BP 118/78   Pulse 64   Ht 5\' 9"  (1.753 m)   Wt 134 lb (60.8 kg)   BMI 19.79 kg/m  BMI: Body mass index is 19.79 kg/m. Well  nourished, well developed, in no acute distress  HEENT: normocephalic, atraumatic  Neck: no JVD, carotid bruits or masses Cardiac:   RRR; no significant murmurs, no rubs, or gallops Lungs:  Slightly diminished at the bases, no wheezing, rhonchi or rales  Abd: soft, nontender MS: no deformity, age appropriate atrophy Ext: 1+ edema to jus above the ankles b/l Skin: warm and dry, no rash Neuro:  No gross deficits appreciated Psych: euthymic mood, full affect  ICD site is stable, no tethering or discomfort.   EKG:  Done today and reviewed by myself is SR, Vpaced, PR 54ms, QRS 175ms, QTC 554ms  ICD interrogation today and reviewed by myself: battery and lead measurements unchanged from prior, AMS episodes are the same looking AT as prior % burden down slightly to .5%, 3 NSVT <3 seconds, 99% BiVe pacing Underlying today is 2:1 AVBlock  12/10/15: [facility], Dr. Martin[place] Conclusion   Prox RCA to Mid RCA lesion, 20% stenosed.  Prox Cx lesion, 30% stenosed.  The left ventricular systolic function is normal.   1. Mild nonobstructive CAD 2. Normal LV function 3. Mild pulmonary HTN with normal LV filling pressures.  Plan: findings suggest his dyspnea is more pulmonary  related. Continue medical therapy.   11/10/15: Echocardiogram Study Conclusions - Left ventricle: The cavity size was mildly dilated. There was   mild focal basal hypertrophy of the posterior wall. Systolic   function was normal. The estimated ejection fraction was in the   range of 55% to 60%. Wall motion was normal; there were no   regional wall motion abnormalities. Doppler parameters are   consistent with abnormal left ventricular relaxation (grade 1   diastolic dysfunction). - Aortic valve: There was mild to moderate regurgitation. - Right ventricle: The cavity size was normal. Wall thickness was   normal. Systolic function was normal. - Tricuspid valve: There was no regurgitation. - Pulmonic valve: There was moderate regurgitation. - Pulmonary arteries: PA peak pressure: 22 mm Hg (S). - Inferior vena cava: The vessel was normal in size. The   respirophasic diameter changes were in the normal range (= 50%),   consistent with normal central venous pressure.   Recent Labs: 10/08/2016: ALT 9; B Natriuretic Peptide 240.0 10/09/2016: Hemoglobin 12.7; Platelets 143 06/12/2017: BUN 23; Creatinine, Ser 1.08; Potassium 4.1; Sodium 141  No results found for requested labs within last 8760 hours.   CrCl cannot be calculated (Patient's most recent lab result is older than the maximum 21 days allowed.).   Wt Readings from Last 3 Encounters:  07/18/17 134 lb (60.8 kg)  02/21/17 133 lb 3.2 oz (60.4 kg)  01/02/17 133 lb 4 oz (60.4 kg)     Other studies reviewed: Additional studies/records reviewed today include: summarized above  ASSESSMENT AND PLAN:  1. CAD/ICM with improved EF     No anginal symptoms   Stable non-obstructive CAD by cath Feb 2017       2. CRT-ICD >>> now with CRT-P at his gen change     Stable findings, no changes made  3. Edema     ? Etiology, having minimal AT episodes, last of any duration was 5hours in July otherwise an hour here and there or shorter       Last echo Jan 2017 with normalized LVEF, grade 1 DD          4. AT/SVT       Low burden, concerns for flecainide side effects  The patient/wife strongly suspect his visual changes and edema  are 2/2 the flecainide, historically reporting known to  Be medication sensitive.  I discussed with our RPH Flecainide can in-fact cause both  For now, stop Flecainide, increase his coreg to 12,6 mg BID.  Increase his lasix to 40mg  TID for 2 days, elevate feet when seated.  Continue daily weights and monitor.         Disposition: I will see him back in 4 weeks, re-evaluate AT burden, ?ablation as optioin.      Current medicines are reviewed at length with the patient today.  The patient did not have any concerns regarding medicines.  Haywood Lasso, PA-C 07/18/2017 10:51 AM     [facility] [address] [place] [address] [phone] (office)  [phone] (fax)

## 2017-07-18 NOTE — Patient Instructions (Addendum)
Medication Instructions:   1. STOP TAKING FLECAINIDE  50 MG  2. START TAKING CARVEDILOL  12.5 MG TWICE A DAY    3 START TAKING LASIX 40 MG THREE TIME A DAY FOR 2 DAYS  ONLY       (THEN  CONTINUE TO TAKE LASIX  40 MG TWICE A DAY )  If you need a refill on your cardiac medications before your next appointment, please call your pharmacy.  Labwork:  NONE ORDERED  TODAY    Testing/Procedures: NONE ORDERED  TODAY   Follow-Up: 4 WEEKS WITH URSUY ( RECALL UN AVAILABLE)     Any Other Special Instructions Will Be Listed Below (If Applicable).

## 2017-07-27 ENCOUNTER — Ambulatory Visit (INDEPENDENT_AMBULATORY_CARE_PROVIDER_SITE_OTHER): Payer: Medicare Other

## 2017-07-27 ENCOUNTER — Telehealth: Payer: Self-pay | Admitting: Cardiology

## 2017-07-27 DIAGNOSIS — Z950 Presence of cardiac pacemaker: Secondary | ICD-10-CM | POA: Diagnosis not present

## 2017-07-27 DIAGNOSIS — I5032 Chronic diastolic (congestive) heart failure: Secondary | ICD-10-CM | POA: Diagnosis not present

## 2017-07-27 NOTE — Telephone Encounter (Signed)
Confirmed remote transmission w/ pt wife.   

## 2017-07-27 NOTE — Progress Notes (Signed)
EPIC Encounter for ICM Monitoring  Patient Name: [name] is a 76 y.o. male Date: 07/27/2017 Primary Care Physican: Glenda Chroman, MD Primary Cardiologist:Taylor Electrophysiologist: Lovena Le Dry Weight:131lbs Bi-V Pacing: 97.4%   Clinical Status (18-Jul-2017 to 27-Jul-2017) AT/AF 46 episodes  Time in AT/AF 1.5 hr/day (6.2%)      Heart Failure questions reviewed, pt asymptomatic.   Thoracic impedance normal.  Prescribed dosage: Furosemide 20 mg 2 tablets (40 mg total) twice a day.    Labs:  06/12/2017 Creatinine 1.08, BUN 23, Potassium 4.1, Sodium 141, EGFR 67-77 02/22/2017 Creatinine 1.02, BUN 19, Potassium 4.0, Sodium 147, EGFR 72-83 10/25/2016 Creatinine 1.12, BUN 16, Potassium 4.4, Sodium 142, EGFR 63-73 10/09/2016 Creatinine 1.03, BUN 22, Potassium 4.2, Sodium 140, EGFR >60 (during hospitalization) 10/08/2016 Creatinine 1.13, BUN 25, Potassium 4.0, Sodium 140, EGFR >60 (during hospitalization) 10/05/2016 Creatinine 1.28, BUN 30, Potassium 3.8, Sodium 140, EGFR 53-60 (during hospitalization) 07/28/2016 Creatinine 0.98, BUN 12, Potassium 4.5, Sodium 141  05/16/2016 Creatinine 1.09, BUN 24, Potassium 4.1, Sodium 140 04/28/2016 Creatinine 1.79, BUN 33, Potassium 4.6, Sodium 140  12/08/2015 Creatinine 1.00, BUN 16, Potassium 4.2, Sodium 141  Recommendations: No changes.  Advised to limit salt intake to 2000 mg/day and fluid intake to < 2 liters/day.  Encouraged to call for fluid symptoms.  Follow-up plan: ICM clinic phone appointment on 08/28/2017.  Office appointment scheduled 09/15/2017 with Dr. Lovena Le.  Copy of ICM check sent to Dr. Lovena Le.   3 month ICM trend: 07/27/2017   1 Year ICM trend:      Rosalene Billings, RN 07/27/2017 11:59 AM

## 2017-07-31 DIAGNOSIS — J40 Bronchitis, not specified as acute or chronic: Secondary | ICD-10-CM | POA: Diagnosis not present

## 2017-07-31 DIAGNOSIS — I10 Essential (primary) hypertension: Secondary | ICD-10-CM | POA: Diagnosis not present

## 2017-07-31 DIAGNOSIS — J449 Chronic obstructive pulmonary disease, unspecified: Secondary | ICD-10-CM | POA: Diagnosis not present

## 2017-07-31 DIAGNOSIS — Z299 Encounter for prophylactic measures, unspecified: Secondary | ICD-10-CM | POA: Diagnosis not present

## 2017-07-31 DIAGNOSIS — Z6820 Body mass index (BMI) 20.0-20.9, adult: Secondary | ICD-10-CM | POA: Diagnosis not present

## 2017-08-01 DIAGNOSIS — R52 Pain, unspecified: Secondary | ICD-10-CM | POA: Diagnosis not present

## 2017-08-01 DIAGNOSIS — M1120 Other chondrocalcinosis, unspecified site: Secondary | ICD-10-CM | POA: Diagnosis not present

## 2017-08-01 DIAGNOSIS — S83241A Other tear of medial meniscus, current injury, right knee, initial encounter: Secondary | ICD-10-CM | POA: Diagnosis not present

## 2017-08-08 DIAGNOSIS — Z23 Encounter for immunization: Secondary | ICD-10-CM | POA: Diagnosis not present

## 2017-08-14 ENCOUNTER — Other Ambulatory Visit: Payer: Self-pay | Admitting: Pulmonary Disease

## 2017-08-28 ENCOUNTER — Telehealth: Payer: Self-pay | Admitting: Cardiology

## 2017-08-28 ENCOUNTER — Ambulatory Visit (INDEPENDENT_AMBULATORY_CARE_PROVIDER_SITE_OTHER): Payer: Medicare Other | Admitting: *Deleted

## 2017-08-28 DIAGNOSIS — I5032 Chronic diastolic (congestive) heart failure: Secondary | ICD-10-CM | POA: Diagnosis not present

## 2017-08-28 DIAGNOSIS — Z950 Presence of cardiac pacemaker: Secondary | ICD-10-CM | POA: Diagnosis not present

## 2017-08-28 DIAGNOSIS — I255 Ischemic cardiomyopathy: Secondary | ICD-10-CM

## 2017-08-28 NOTE — Progress Notes (Signed)
EPIC Encounter for ICM Monitoring  Patient Name: [name] is a 76 y.o. male Date: 08/28/2017 Primary Care Physican: Glenda Chroman, MD Primary Cardiologist:Taylor Electrophysiologist: Lovena Le Dry Weight: 134lbs  Bi-V Pacing: 96.8%   Clinical Status (27-Jul-2017 to 28-Aug-2017) AT/AF 73 episodes  Time in AT/AF 1.1 hr/day (4.6%)  Observations  (27-Jul-2017 to 28-Aug-2017)  1 days with more than 6 hr AT/AF.       Spoke with wife.  Heart Failure questions reviewed, pt asymptomatic.  Sometimes he feels his heart racing.    Optivol: Thoracic impedance normal.  Prescribed dosage: Furosemide 20 mg 2 tablets (40 mg total) twice a day.    Labs:  06/12/2017 Creatinine 1.08, BUN 23, Potassium 4.1, Sodium 141, EGFR 67-77 02/22/2017 Creatinine 1.02, BUN 19, Potassium 4.0, Sodium 147, EGFR 72-83 10/25/2016 Creatinine 1.12, BUN 16, Potassium 4.4, Sodium 142, EGFR 63-73 10/09/2016 Creatinine 1.03, BUN 22, Potassium 4.2, Sodium 140, EGFR >60 (during hospitalization) 10/08/2016 Creatinine 1.13, BUN 25, Potassium 4.0, Sodium 140, EGFR >60 (during hospitalization) 10/05/2016 Creatinine 1.28, BUN 30, Potassium 3.8, Sodium 140, EGFR 53-60 (during hospitalization) 07/28/2016 Creatinine 0.98, BUN 12, Potassium 4.5, Sodium 141  05/16/2016 Creatinine 1.09, BUN 24, Potassium 4.1, Sodium 140 04/28/2016 Creatinine 1.79, BUN 33, Potassium 4.6, Sodium 140  12/08/2015 Creatinine 1.00, BUN 16, Potassium 4.2, Sodium 141  Recommendations: No changes.   Encouraged to call for fluid symptoms.  Follow-up plan: ICM clinic phone appointment on 09/28/2017.    Copy of ICM check sent to Dr. Lovena Le.   3 month ICM trend: 08/28/2017    AT/AF   1 Year ICM trend:       Rosalene Billings, RN 08/28/2017 4:13 PM

## 2017-08-28 NOTE — Telephone Encounter (Signed)
LMOVM reminding pt to send remote transmission.   

## 2017-08-29 LAB — CUP PACEART REMOTE DEVICE CHECK
Battery Remaining Longevity: 53 mo
Brady Statistic RA Percent Paced: 40.59 %
Brady Statistic RV Percent Paced: 96.83 %
Implantable Lead Implant Date: 20050810
Implantable Lead Implant Date: 20050810
Implantable Lead Location: 753860
Implantable Lead Model: 6949
Implantable Pulse Generator Implant Date: 20170718
Lead Channel Impedance Value: 475 Ohm
Lead Channel Impedance Value: 475 Ohm
Lead Channel Impedance Value: 532 Ohm
Lead Channel Pacing Threshold Amplitude: 1.375 V
Lead Channel Pacing Threshold Pulse Width: 0.4 ms
Lead Channel Sensing Intrinsic Amplitude: 10.625 mV
Lead Channel Sensing Intrinsic Amplitude: 10.625 mV
Lead Channel Setting Pacing Amplitude: 1.5 V
Lead Channel Setting Pacing Amplitude: 3.5 V
Lead Channel Setting Pacing Pulse Width: 1 ms
MDC IDC LEAD IMPLANT DT: 20071015
MDC IDC LEAD LOCATION: 753858
MDC IDC LEAD LOCATION: 753859
MDC IDC MSMT BATTERY VOLTAGE: 3 V
MDC IDC MSMT LEADCHNL LV IMPEDANCE VALUE: 247 Ohm
MDC IDC MSMT LEADCHNL LV IMPEDANCE VALUE: 418 Ohm
MDC IDC MSMT LEADCHNL LV IMPEDANCE VALUE: 475 Ohm
MDC IDC MSMT LEADCHNL LV IMPEDANCE VALUE: 570 Ohm
MDC IDC MSMT LEADCHNL LV IMPEDANCE VALUE: 646 Ohm
MDC IDC MSMT LEADCHNL RA PACING THRESHOLD AMPLITUDE: 0.625 V
MDC IDC MSMT LEADCHNL RA PACING THRESHOLD PULSEWIDTH: 0.4 ms
MDC IDC MSMT LEADCHNL RA SENSING INTR AMPL: 6.25 mV
MDC IDC MSMT LEADCHNL RA SENSING INTR AMPL: 6.25 mV
MDC IDC MSMT LEADCHNL RV IMPEDANCE VALUE: 608 Ohm
MDC IDC SESS DTM: 20181113031555
MDC IDC SET LEADCHNL RV PACING AMPLITUDE: 2.5 V
MDC IDC SET LEADCHNL RV PACING PULSEWIDTH: 0.6 ms
MDC IDC SET LEADCHNL RV SENSING SENSITIVITY: 2.8 mV
MDC IDC STAT BRADY AP VP PERCENT: 41.71 %
MDC IDC STAT BRADY AP VS PERCENT: 0.09 %
MDC IDC STAT BRADY AS VP PERCENT: 56.84 %
MDC IDC STAT BRADY AS VS PERCENT: 1.36 %

## 2017-08-29 NOTE — Progress Notes (Signed)
Remote pacemaker transmission.   

## 2017-08-30 DIAGNOSIS — H26493 Other secondary cataract, bilateral: Secondary | ICD-10-CM | POA: Diagnosis not present

## 2017-08-30 DIAGNOSIS — H31091 Other chorioretinal scars, right eye: Secondary | ICD-10-CM | POA: Diagnosis not present

## 2017-08-30 DIAGNOSIS — Z961 Presence of intraocular lens: Secondary | ICD-10-CM | POA: Diagnosis not present

## 2017-09-01 ENCOUNTER — Encounter: Payer: Self-pay | Admitting: Cardiology

## 2017-09-04 DIAGNOSIS — R112 Nausea with vomiting, unspecified: Secondary | ICD-10-CM | POA: Insufficient documentation

## 2017-09-04 DIAGNOSIS — Z95810 Presence of automatic (implantable) cardiac defibrillator: Secondary | ICD-10-CM | POA: Insufficient documentation

## 2017-09-04 DIAGNOSIS — I2699 Other pulmonary embolism without acute cor pulmonale: Secondary | ICD-10-CM | POA: Insufficient documentation

## 2017-09-04 DIAGNOSIS — Z9889 Other specified postprocedural states: Secondary | ICD-10-CM

## 2017-09-14 DIAGNOSIS — H26491 Other secondary cataract, right eye: Secondary | ICD-10-CM | POA: Diagnosis not present

## 2017-09-14 DIAGNOSIS — Z961 Presence of intraocular lens: Secondary | ICD-10-CM | POA: Diagnosis not present

## 2017-09-14 DIAGNOSIS — H31091 Other chorioretinal scars, right eye: Secondary | ICD-10-CM | POA: Diagnosis not present

## 2017-09-15 ENCOUNTER — Encounter: Payer: Self-pay | Admitting: Internal Medicine

## 2017-09-15 ENCOUNTER — Ambulatory Visit (INDEPENDENT_AMBULATORY_CARE_PROVIDER_SITE_OTHER): Payer: Medicare Other | Admitting: Internal Medicine

## 2017-09-15 VITALS — BP 142/72 | HR 66 | Ht 69.0 in | Wt 140.2 lb

## 2017-09-15 DIAGNOSIS — Z5181 Encounter for therapeutic drug level monitoring: Secondary | ICD-10-CM

## 2017-09-15 DIAGNOSIS — I472 Ventricular tachycardia, unspecified: Secondary | ICD-10-CM

## 2017-09-15 DIAGNOSIS — I5022 Chronic systolic (congestive) heart failure: Secondary | ICD-10-CM

## 2017-09-15 DIAGNOSIS — Z950 Presence of cardiac pacemaker: Secondary | ICD-10-CM | POA: Diagnosis not present

## 2017-09-15 DIAGNOSIS — Z79899 Other long term (current) drug therapy: Secondary | ICD-10-CM | POA: Diagnosis not present

## 2017-09-15 DIAGNOSIS — I255 Ischemic cardiomyopathy: Secondary | ICD-10-CM

## 2017-09-15 DIAGNOSIS — I471 Supraventricular tachycardia: Secondary | ICD-10-CM | POA: Diagnosis not present

## 2017-09-15 DIAGNOSIS — Z01812 Encounter for preprocedural laboratory examination: Secondary | ICD-10-CM

## 2017-09-15 DIAGNOSIS — I2589 Other forms of chronic ischemic heart disease: Secondary | ICD-10-CM

## 2017-09-15 NOTE — Progress Notes (Signed)
HPI [name] returns today for ongoing evaluation and management of his biV PPM. He also has a h/o atrial tachycardia. He does not have palpitations but has episodes where he feels like his heart is racing and he is fatigued. No syncope. He had initially been treated with flecainide but developed leg pain and numbness and ultimately had to stop the flecainide. Allergies  Allergen Reactions  . Spironolactone Other (See Comments)    gynecomastia     Current Outpatient Medications  Medication Sig Dispense Refill  . aspirin 81 MG tablet Take 81 mg by mouth daily.     . calcium-vitamin D (OSCAL WITH D) 500-200 MG-UNIT tablet Take 1 tablet by mouth daily.    . carvedilol (COREG) 12.5 MG tablet Take 1 tablet (12.5 mg total) by mouth 2 (two) times daily. 60 tablet 3  . eplerenone (INSPRA) 25 MG tablet Take 1 tablet (25 mg total) by mouth daily. 90 tablet 3  . fluticasone furoate-vilanterol (BREO ELLIPTA) 100-25 MCG/INH AEPB Inhale 1 puff into the lungs daily. 60 each 11  . furosemide (LASIX) 20 MG tablet Take 2 tablets (40 mg total) by mouth 2 (two) times daily. 120 tablet 2  . levothyroxine (SYNTHROID, LEVOTHROID) 125 MCG tablet Take 125 mcg by mouth daily before breakfast.    . Multiple Vitamins-Minerals (MULTIVITAMIN WITH MINERALS) tablet Take 1 tablet by mouth daily.    . niacin 500 MG tablet Take 500 mg by mouth daily.    . simvastatin (ZOCOR) 20 MG tablet Take 1 tablet (20 mg total) by mouth at bedtime. 90 tablet 3  . trandolapril (MAVIK) 2 MG tablet Take 1 tablet (2 mg total) by mouth daily. 90 tablet 3  . VENTOLIN HFA 108 (90 Base) MCG/ACT inhaler INHALE TWO PUFFS BY MOUTH EVERY 6 HOURS AS NEEDED FOR WHEEZING OR  SHORTNESS  OF  BREATH 18 each 3   No current facility-administered medications for this visit.      Past Medical History:  Diagnosis Date  . AICD (automatic cardioverter/defibrillator) present   . Aortic insufficiency     moderate aortic ins  moderate/asymptomatic/normal LV cavity size.   . Carotid artery disease ([place])     less than 50% stenosis bilaterally  . Drug-induced gynecomastia     secondary to spironolactone  . Dyslipidemia   . ICD (implantable cardiac defibrillator), biventricular, in situ     Medtronic model  [id number]  . Ischemic cardiomyopathy     status post non-ST elevation microinfarction stent to the right coronary artery is a   . LV dysfunction     NYHA class I/prior ejection fraction 20-25% and an ejection fraction 7 2009 50-55%, ejection fraction 60% bedside echocardiogram July 2012   . PONV (postoperative nausea and vomiting)   . Pulmonary embolism (H[facility])    Prior history of pulmonary embolism off Coumadin.  . Ventricular tachycardia ([place])    Inducible ventricular polymorphic tachycardia status post ICD followed by upgrade with CRT-D 2007, battery change at 01/31/2011    ROS:   All systems reviewed and negative except as noted in the HPI.   Past Surgical History:  Procedure Laterality Date  . CARDIAC CATHETERIZATION    . CARDIAC CATHETERIZATION N/A 12/10/2015   Procedure: Right/Left Heart Cath and Coronary Angiography;  Surgeon: Peter M Martinique, MD;  Location: [facility];  Service: Cardiovascular;  Laterality: N/A;  . CARDIAC DEFIBRILLATOR PLACEMENT     medtronic, remote - yes  . CATARACT EXTRACTION W/PHACO Left  08/25/2014   Procedure: CATARACT EXTRACTION PHACO AND INTRAOCULAR LENS PLACEMENT (IOC);  Surgeon: Tonny Branch, MD;  Location: [facility];  Service: Ophthalmology;  Laterality: Left;  CDE 5.79  . CATARACT EXTRACTION W/PHACO Right 09/18/2014   Procedure: CATARACT EXTRACTION PHACO AND INTRAOCULAR LENS PLACEMENT RIGHT EYE;  Surgeon: Tonny Branch, MD;  Location: [facility];  Service: Ophthalmology;  Laterality: Right;  CDE:3.35  . CORONARY STENT PLACEMENT    . EP IMPLANTABLE DEVICE N/A 05/03/2016   Procedure: BIV PPM Generator Changeout;  Surgeon: Evans Lance, MD;  Location: [facility];   Service: Cardiovascular;  Laterality: N/A;  . INGUINAL HERNIA REPAIR    . left breast biopsy    . RETROPUBIC PROSTATECTOMY    . THORACOTOMY     left  . THYROID SURGERY    . THYROIDECTOMY       Family History  Problem Relation Age of Onset  . Diabetes type I Sister        daughter   . Lung cancer Brother        several types of cancer  . Lung cancer Brother        several types of cancer  . Lung cancer Brother   . Clotting disorder Neg Hx        also no repiratory disease   . Prostate cancer Neg Hx      Social History   Socioeconomic History  . Marital status: Married    Spouse name: Not on file  . Number of children: 2  . Years of education: Not on file  . Highest education level: Not on file  Social Needs  . Financial resource strain: Not on file  . Food insecurity - worry: Not on file  . Food insecurity - inability: Not on file  . Transportation needs - medical: Not on file  . Transportation needs - non-medical: Not on file  Occupational History  . Occupation: Development worker, community - self employed    Employer: RETIRED  Tobacco Use  . Smoking status: Former Smoker    Packs/day: 1.00    Years: 43.00    Pack years: 43.00    Types: Cigarettes    Start date: 10/17/1957    Last attempt to quit: 12/13/2000    Years since quitting: 16.7  . Smokeless tobacco: Never Used  . Tobacco comment: started at age 86  Substance and Sexual Activity  . Alcohol use: No    Alcohol/week: 0.0 oz  . Drug use: No  . Sexual activity: Not Currently  Other Topics Concern  . Not on file  Social History Narrative   Self employed Development worker, community; 2 daughters.      BP (!) 142/72   Pulse 66   Ht 5\' 9"  (1.753 m)   Wt 140 lb 3.2 oz (63.6 kg)   SpO2 90%   BMI 20.70 kg/m   Physical Exam:  Well appearing 76 yo man, NAD HEENT: Unremarkable Neck:  No JVD, no thyromegally Lymphatics:  No adenopathy Back:  No CVA tenderness Lungs:  Clear except for rales in the bases. HEART:  Regular rate rhythm, no  murmurs, no rubs, no clicks Abd:  soft, positive bowel sounds, no organomegally, no rebound, no guarding Ext:  2 plus pulses, no edema, no cyanosis, no clubbing Skin:  No rashes no nodules Neuro:  CN II through XII intact, motor grossly intact  DEVICE  Normal device function.  See PaceArt for details.   Assess/Plan: 1. Atrial tachycardia - I have discussed  the treatment options with the patient and his wife and he would like to proceed with catheter ablation of his atrial tachycardia. We also discussed medical options with Propafenone. This will be scheduled in the next few weeks. 2. Chronic diastolic heart failure - he has class II when he is in sinus rhythm, and class III when he is in atrial tachycardia. He will continue a low-sodium diet and his current medical therapy. 3. Biventricular pacemaker - his Medtronic device has been interrogated today and found to work satisfactorily. We'll plan to recheck in several months. 4. COPD - he will continue his bronchodilators. I encouraged the patient to avoid patients who are sick.  Cristopher Peru, M.D.

## 2017-09-15 NOTE — Patient Instructions (Addendum)
Medication Instructions:  Your physician recommends that you continue on your current medications as directed. Please refer to the Current Medication list given to you today.  Labwork: You will get lab work today:  BMP and CBC.  Testing/Procedures: Your physician has recommended that you have an ablation. Catheter ablation is a medical procedure used to treat some cardiac arrhythmias (irregular heartbeats). During catheter ablation, a long, thin, flexible tube is put into a blood vessel in your groin (upper thigh), or neck. This tube is called an ablation catheter. It is then guided to your heart through the blood vessel. Radio frequency waves destroy small areas of heart tissue where abnormal heartbeats may cause an arrhythmia to start.   Follow-Up:  You will follow up with Dr. Lovena Le 4 weeks after your procedure.  Any Other Special Instructions Will Be Listed Below (If Applicable).  Please arrive at the [facility] main entrance of [facility] hospital at:  5:30 am on September 25, 2017. Do not eat or drink after midnight prior to procedure Do not take any medications the morning of the procedure Do NOT take your carvedilol the day before or day of your procedure Plan for one night stay-but you may be discharged home You will need someone to drive you home at discharge      If you need a refill on your cardiac medications before your next appointment, please call your pharmacy.

## 2017-09-16 LAB — BASIC METABOLIC PANEL
BUN / CREAT RATIO: 18 (ref 10–24)
BUN: 18 mg/dL (ref 8–27)
CO2: 30 mmol/L — ABNORMAL HIGH (ref 20–29)
CREATININE: 0.99 mg/dL (ref 0.76–1.27)
Calcium: 9.3 mg/dL (ref 8.6–10.2)
Chloride: 100 mmol/L (ref 96–106)
GFR calc non Af Amer: 74 mL/min/{1.73_m2} (ref >59)
GFR, EST AFRICAN AMERICAN: 86 mL/min/{1.73_m2} (ref >59)
Glucose: 72 mg/dL (ref 65–99)
Potassium: 4.1 mmol/L (ref 3.5–5.2)
SODIUM: 145 mmol/L — AB (ref 134–144)

## 2017-09-16 LAB — CBC WITH DIFFERENTIAL/PLATELET
BASOS: 1 %
Basophils Absolute: 0 10*3/uL (ref 0.0–0.2)
EOS (ABSOLUTE): 0.1 10*3/uL (ref 0.0–0.4)
EOS: 1 %
HEMATOCRIT: 42 % (ref 37.5–51.0)
HEMOGLOBIN: 13.6 g/dL (ref 13.0–17.7)
Immature Grans (Abs): 0.1 10*3/uL (ref 0.0–0.1)
Immature Granulocytes: 1 %
LYMPHS ABS: 1.7 10*3/uL (ref 0.7–3.1)
Lymphs: 22 %
MCH: 28.9 pg (ref 26.6–33.0)
MCHC: 32.4 g/dL (ref 31.5–35.7)
MCV: 89 fL (ref 79–97)
MONOCYTES: 10 %
MONOS ABS: 0.8 10*3/uL (ref 0.1–0.9)
NEUTROS ABS: 5.2 10*3/uL (ref 1.4–7.0)
Neutrophils: 65 %
Platelets: 263 10*3/uL (ref 150–379)
RBC: 4.7 x10E6/uL (ref 4.14–5.80)
RDW: 15.1 % (ref 12.3–15.4)
WBC: 7.8 10*3/uL (ref 3.4–10.8)

## 2017-09-21 ENCOUNTER — Other Ambulatory Visit: Payer: Self-pay | Admitting: Physician Assistant

## 2017-09-22 ENCOUNTER — Telehealth: Payer: Self-pay | Admitting: Internal Medicine

## 2017-09-22 DIAGNOSIS — I2510 Atherosclerotic heart disease of native coronary artery without angina pectoris: Secondary | ICD-10-CM | POA: Diagnosis not present

## 2017-09-22 DIAGNOSIS — J449 Chronic obstructive pulmonary disease, unspecified: Secondary | ICD-10-CM | POA: Diagnosis not present

## 2017-09-22 DIAGNOSIS — E7800 Pure hypercholesterolemia, unspecified: Secondary | ICD-10-CM | POA: Diagnosis not present

## 2017-09-22 DIAGNOSIS — M159 Polyosteoarthritis, unspecified: Secondary | ICD-10-CM | POA: Diagnosis not present

## 2017-09-22 DIAGNOSIS — Z01812 Encounter for preprocedural laboratory examination: Secondary | ICD-10-CM

## 2017-09-22 NOTE — Telephone Encounter (Signed)
Called and spoke to patient. Patient states that he wanted to cancel his ablation scheduled for 12/10. Case has already been cancelled in [place]. Patient states that he feels fine and has no complaints at this time. Patient would like for Dr. Tanna Furry RN to give him a call next week about rescheduling. Made patient aware that the information would be forwarded. Patient thanked me for the call.

## 2017-09-22 NOTE — Telephone Encounter (Signed)
New message    Patient wife calling to cancel procedure for Monday. Requesting to speak with a nurse prior to cancelling . Please call

## 2017-09-25 ENCOUNTER — Encounter (HOSPITAL_COMMUNITY): Admission: RE | Payer: Self-pay | Source: Ambulatory Visit

## 2017-09-25 ENCOUNTER — Ambulatory Visit (HOSPITAL_COMMUNITY): Admission: RE | Admit: 2017-09-25 | Payer: Medicare Other | Source: Ambulatory Visit | Admitting: Internal Medicine

## 2017-09-25 SURGERY — ATRIAL TACH ABLATION
Anesthesia: LOCAL

## 2017-09-27 DIAGNOSIS — E7800 Pure hypercholesterolemia, unspecified: Secondary | ICD-10-CM | POA: Diagnosis not present

## 2017-09-27 DIAGNOSIS — I2510 Atherosclerotic heart disease of native coronary artery without angina pectoris: Secondary | ICD-10-CM | POA: Diagnosis not present

## 2017-09-27 DIAGNOSIS — I10 Essential (primary) hypertension: Secondary | ICD-10-CM | POA: Diagnosis not present

## 2017-09-27 DIAGNOSIS — E039 Hypothyroidism, unspecified: Secondary | ICD-10-CM | POA: Diagnosis not present

## 2017-09-27 DIAGNOSIS — Z6820 Body mass index (BMI) 20.0-20.9, adult: Secondary | ICD-10-CM | POA: Diagnosis not present

## 2017-09-27 DIAGNOSIS — J449 Chronic obstructive pulmonary disease, unspecified: Secondary | ICD-10-CM | POA: Diagnosis not present

## 2017-09-27 DIAGNOSIS — Z299 Encounter for prophylactic measures, unspecified: Secondary | ICD-10-CM | POA: Diagnosis not present

## 2017-09-28 ENCOUNTER — Telehealth: Payer: Self-pay | Admitting: Cardiology

## 2017-09-28 ENCOUNTER — Ambulatory Visit (INDEPENDENT_AMBULATORY_CARE_PROVIDER_SITE_OTHER): Payer: Medicare Other

## 2017-09-28 DIAGNOSIS — Z95810 Presence of automatic (implantable) cardiac defibrillator: Secondary | ICD-10-CM | POA: Diagnosis not present

## 2017-09-28 DIAGNOSIS — I5022 Chronic systolic (congestive) heart failure: Secondary | ICD-10-CM | POA: Diagnosis not present

## 2017-09-28 DIAGNOSIS — Z950 Presence of cardiac pacemaker: Secondary | ICD-10-CM

## 2017-09-28 NOTE — Telephone Encounter (Signed)
Confirmed remote transmission w/ pt wife.   

## 2017-09-28 NOTE — Telephone Encounter (Signed)
Call made to Pt.  Pt rescheduled for 10/20/2017 @ 9:30 am.  Pt to redo lab work 10/18/2017.   Will redo letter-f/u appt rescheduled.

## 2017-09-29 LAB — CUP PACEART INCLINIC DEVICE CHECK
Brady Statistic AP VP Percent: 40.35 %
Brady Statistic AS VP Percent: 58.23 %
Brady Statistic AS VS Percent: 1.33 %
Date Time Interrogation Session: 20181201023030
Implantable Lead Implant Date: 20050810
Implantable Lead Location: 753859
Implantable Lead Location: 753860
Implantable Lead Model: 4194
Lead Channel Impedance Value: 399 Ohm
Lead Channel Impedance Value: 475 Ohm
Lead Channel Impedance Value: 532 Ohm
Lead Channel Impedance Value: 551 Ohm
Lead Channel Pacing Threshold Amplitude: 0.5 V
Lead Channel Pacing Threshold Pulse Width: 0.4 ms
Lead Channel Setting Pacing Amplitude: 1.5 V
Lead Channel Setting Pacing Amplitude: 2.5 V
Lead Channel Setting Pacing Pulse Width: 0.6 ms
Lead Channel Setting Sensing Sensitivity: 2.8 mV
MDC IDC LEAD IMPLANT DT: 20050810
MDC IDC LEAD IMPLANT DT: 20071015
MDC IDC LEAD LOCATION: 753858
MDC IDC MSMT BATTERY REMAINING LONGEVITY: 55 mo
MDC IDC MSMT BATTERY VOLTAGE: 3 V
MDC IDC MSMT LEADCHNL LV IMPEDANCE VALUE: 247 Ohm
MDC IDC MSMT LEADCHNL LV IMPEDANCE VALUE: 646 Ohm
MDC IDC MSMT LEADCHNL RA IMPEDANCE VALUE: 475 Ohm
MDC IDC MSMT LEADCHNL RA SENSING INTR AMPL: 5.5 mV
MDC IDC MSMT LEADCHNL RV IMPEDANCE VALUE: 456 Ohm
MDC IDC MSMT LEADCHNL RV IMPEDANCE VALUE: 589 Ohm
MDC IDC MSMT LEADCHNL RV PACING THRESHOLD AMPLITUDE: 1 V
MDC IDC MSMT LEADCHNL RV PACING THRESHOLD PULSEWIDTH: 0.4 ms
MDC IDC MSMT LEADCHNL RV SENSING INTR AMPL: 11.625 mV
MDC IDC PG IMPLANT DT: 20170718
MDC IDC SET LEADCHNL LV PACING AMPLITUDE: 3.5 V
MDC IDC SET LEADCHNL LV PACING PULSEWIDTH: 1 ms
MDC IDC STAT BRADY AP VS PERCENT: 0.1 %
MDC IDC STAT BRADY RA PERCENT PACED: 39.21 %
MDC IDC STAT BRADY RV PERCENT PACED: 96.69 %

## 2017-09-29 NOTE — Telephone Encounter (Signed)
New instruction letter sent via [employer].  Will also leave hard copy for family for pick up when here for labs 10/18/2017.  CVM left notifying of above.  This nurse name and # left if any further questions.

## 2017-10-03 NOTE — Progress Notes (Signed)
EPIC Encounter for ICM Monitoring  Patient Name: [name] is a 76 y.o. male Date: 10/03/2017 Primary Care Physican: Glenda Chroman, MD Primary Cardiologist:Taylor Electrophysiologist: Lovena Le Dry Weight: Previous weight 134lbs  Bi-V Pacing: 97.18%      Transmission received.   Thoracic impedance normal.  Prescribed dosage: Furosemide 20 mg2tablets (40 mg total) twice a day.  Labs:  09/15/2017 Creatinine 0.99, BUN 18, Potassium 4.1, Sodium 145, EGFR 74-86 06/12/2017 Creatinine 1.08, BUN 23, Potassium 4.1, Sodium 141, EGFR 67-77 02/22/2017 Creatinine 1.02, BUN 19, Potassium 4.0, Sodium 147, EGFR 72-83 10/25/2016 Creatinine 1.12, BUN 16, Potassium 4.4, Sodium 142, EGFR 63-73 10/09/2016 Creatinine 1.03, BUN 22, Potassium 4.2, Sodium 140, EGFR >60 (during hospitalization) 10/08/2016 Creatinine 1.13, BUN 25, Potassium 4.0, Sodium 140, EGFR >60 (during hospitalization) 10/05/2016 Creatinine 1.28, BUN 30, Potassium 3.8, Sodium 140, EGFR 53-60 (during hospitalization) 07/28/2016 Creatinine 0.98, BUN 12, Potassium 4.5, Sodium 141  05/16/2016 Creatinine 1.09, BUN 24, Potassium 4.1, Sodium 140 04/28/2016 Creatinine 1.79, BUN 33, Potassium 4.6, Sodium 140  12/08/2015 Creatinine 1.00, BUN 16, Potassium 4.2, Sodium 141  Recommendations: None.  Follow-up plan: ICM clinic phone appointment on 10/30/2017.  Office appointment scheduled 11/23/2017 with Dr. Lovena Le.  Copy of ICM check sent to Dr. Mel Almond.   3 month ICM trend: 09/28/2017    1 Year ICM trend:       Rosalene Billings, RN 10/03/2017 8:20 AM

## 2017-10-11 DIAGNOSIS — I2510 Atherosclerotic heart disease of native coronary artery without angina pectoris: Secondary | ICD-10-CM | POA: Diagnosis not present

## 2017-10-11 DIAGNOSIS — M25561 Pain in right knee: Secondary | ICD-10-CM | POA: Diagnosis not present

## 2017-10-11 DIAGNOSIS — Z299 Encounter for prophylactic measures, unspecified: Secondary | ICD-10-CM | POA: Diagnosis not present

## 2017-10-11 DIAGNOSIS — Z6821 Body mass index (BMI) 21.0-21.9, adult: Secondary | ICD-10-CM | POA: Diagnosis not present

## 2017-10-18 ENCOUNTER — Other Ambulatory Visit: Payer: Medicare Other | Admitting: *Deleted

## 2017-10-18 DIAGNOSIS — Z01812 Encounter for preprocedural laboratory examination: Secondary | ICD-10-CM

## 2017-10-18 DIAGNOSIS — I471 Supraventricular tachycardia: Secondary | ICD-10-CM | POA: Diagnosis not present

## 2017-10-19 LAB — BASIC METABOLIC PANEL
BUN / CREAT RATIO: 18 (ref 10–24)
BUN: 18 mg/dL (ref 8–27)
CO2: 26 mmol/L (ref 20–29)
CREATININE: 0.98 mg/dL (ref 0.76–1.27)
Calcium: 9.3 mg/dL (ref 8.6–10.2)
Chloride: 102 mmol/L (ref 96–106)
GFR calc Af Amer: 87 mL/min/{1.73_m2} (ref >59)
GFR calc non Af Amer: 75 mL/min/{1.73_m2} (ref >59)
GLUCOSE: 85 mg/dL (ref 65–99)
POTASSIUM: 4.2 mmol/L (ref 3.5–5.2)
SODIUM: 144 mmol/L (ref 134–144)

## 2017-10-19 LAB — CBC WITH DIFFERENTIAL/PLATELET
Basophils Absolute: 0 10*3/uL (ref 0.0–0.2)
Basos: 1 %
EOS (ABSOLUTE): 0.1 10*3/uL (ref 0.0–0.4)
EOS: 1 %
HEMATOCRIT: 42.7 % (ref 37.5–51.0)
Hemoglobin: 14 g/dL (ref 13.0–17.7)
Immature Grans (Abs): 0 10*3/uL (ref 0.0–0.1)
Immature Granulocytes: 1 %
Lymphocytes Absolute: 1.3 10*3/uL (ref 0.7–3.1)
Lymphs: 19 %
MCH: 29.5 pg (ref 26.6–33.0)
MCHC: 32.8 g/dL (ref 31.5–35.7)
MCV: 90 fL (ref 79–97)
MONOS ABS: 0.5 10*3/uL (ref 0.1–0.9)
Monocytes: 7 %
Neutrophils Absolute: 5.1 10*3/uL (ref 1.4–7.0)
Neutrophils: 71 %
Platelets: 259 10*3/uL (ref 150–379)
RBC: 4.75 x10E6/uL (ref 4.14–5.80)
RDW: 14.1 % (ref 12.3–15.4)
WBC: 7.1 10*3/uL (ref 3.4–10.8)

## 2017-10-20 ENCOUNTER — Other Ambulatory Visit: Payer: Self-pay

## 2017-10-20 ENCOUNTER — Ambulatory Visit (HOSPITAL_COMMUNITY)
Admission: RE | Admit: 2017-10-20 | Discharge: 2017-10-21 | Disposition: A | Discharge status: Home / Self Care | Payer: Medicare Other | Source: Ambulatory Visit | Attending: Internal Medicine | Admitting: Internal Medicine

## 2017-10-20 ENCOUNTER — Ambulatory Visit (HOSPITAL_COMMUNITY)
Admission: RE | Disposition: A | Discharge status: Home / Self Care | Payer: Self-pay | Source: Ambulatory Visit | Attending: Internal Medicine

## 2017-10-20 ENCOUNTER — Encounter (HOSPITAL_COMMUNITY): Payer: Self-pay | Admitting: General Practice

## 2017-10-20 DIAGNOSIS — I5022 Chronic systolic (congestive) heart failure: Secondary | ICD-10-CM | POA: Diagnosis not present

## 2017-10-20 DIAGNOSIS — E785 Hyperlipidemia, unspecified: Secondary | ICD-10-CM | POA: Diagnosis not present

## 2017-10-20 DIAGNOSIS — J449 Chronic obstructive pulmonary disease, unspecified: Secondary | ICD-10-CM | POA: Diagnosis not present

## 2017-10-20 DIAGNOSIS — Z79890 Hormone replacement therapy (postmenopausal): Secondary | ICD-10-CM | POA: Insufficient documentation

## 2017-10-20 DIAGNOSIS — Z86711 Personal history of pulmonary embolism: Secondary | ICD-10-CM | POA: Diagnosis not present

## 2017-10-20 DIAGNOSIS — Z87891 Personal history of nicotine dependence: Secondary | ICD-10-CM | POA: Insufficient documentation

## 2017-10-20 DIAGNOSIS — I4719 Other supraventricular tachycardia: Secondary | ICD-10-CM | POA: Diagnosis present

## 2017-10-20 DIAGNOSIS — I2510 Atherosclerotic heart disease of native coronary artery without angina pectoris: Secondary | ICD-10-CM | POA: Insufficient documentation

## 2017-10-20 DIAGNOSIS — Z79899 Other long term (current) drug therapy: Secondary | ICD-10-CM | POA: Diagnosis not present

## 2017-10-20 DIAGNOSIS — Z95810 Presence of automatic (implantable) cardiac defibrillator: Secondary | ICD-10-CM | POA: Insufficient documentation

## 2017-10-20 DIAGNOSIS — I471 Supraventricular tachycardia: Secondary | ICD-10-CM

## 2017-10-20 DIAGNOSIS — Z7982 Long term (current) use of aspirin: Secondary | ICD-10-CM | POA: Insufficient documentation

## 2017-10-20 DIAGNOSIS — I255 Ischemic cardiomyopathy: Secondary | ICD-10-CM | POA: Diagnosis not present

## 2017-10-20 HISTORY — DX: Chronic systolic (congestive) heart failure: I50.22

## 2017-10-20 HISTORY — PX: ABLATION OF DYSRHYTHMIC FOCUS: SHX254

## 2017-10-20 HISTORY — PX: ATRIAL TACH ABLATION: EP1192

## 2017-10-20 HISTORY — DX: Essential (primary) hypertension: I10

## 2017-10-20 HISTORY — DX: Atherosclerotic heart disease of native coronary artery without angina pectoris: I25.10

## 2017-10-20 LAB — POCT ACTIVATED CLOTTING TIME
ACTIVATED CLOTTING TIME: 208 s
Activated Clotting Time: 197 seconds

## 2017-10-20 SURGERY — ATRIAL TACH ABLATION

## 2017-10-20 MED ORDER — FENTANYL CITRATE (PF) 100 MCG/2ML IJ SOLN
INTRAMUSCULAR | Status: AC
  Filled 2017-10-20: qty 2

## 2017-10-20 MED ORDER — MIDAZOLAM HCL 5 MG/5ML IJ SOLN
INTRAMUSCULAR | Status: AC
  Filled 2017-10-20: qty 5

## 2017-10-20 MED ORDER — TRANDOLAPRIL 2 MG PO TABS
2.0000 mg | ORAL_TABLET | Freq: Every day | ORAL | Status: DC
  Administered 2017-10-21: 10:00:00 2 mg via ORAL
  Filled 2017-10-20: qty 1

## 2017-10-20 MED ORDER — FUROSEMIDE 40 MG PO TABS
40.0000 mg | ORAL_TABLET | Freq: Two times a day (BID) | ORAL | Status: DC
  Administered 2017-10-20 – 2017-10-21 (×2): 40 mg via ORAL
  Filled 2017-10-20 (×2): qty 1

## 2017-10-20 MED ORDER — SODIUM CHLORIDE 0.9 % IV SOLN
INTRAVENOUS | Status: DC
  Administered 2017-10-20: 08:00:00 via INTRAVENOUS

## 2017-10-20 MED ORDER — MIDAZOLAM HCL 5 MG/5ML IJ SOLN
INTRAMUSCULAR | Status: DC | PRN
  Administered 2017-10-20 (×7): 1 mg via INTRAVENOUS

## 2017-10-20 MED ORDER — IOPAMIDOL (ISOVUE-370) INJECTION 76%
INTRAVENOUS | Status: AC
  Filled 2017-10-20: qty 50

## 2017-10-20 MED ORDER — BUPIVACAINE HCL (PF) 0.25 % IJ SOLN
INTRAMUSCULAR | Status: AC
  Filled 2017-10-20: qty 30

## 2017-10-20 MED ORDER — SIMVASTATIN 20 MG PO TABS
20.0000 mg | ORAL_TABLET | Freq: Every day | ORAL | Status: DC
  Administered 2017-10-20: 20 mg via ORAL
  Filled 2017-10-20: qty 1

## 2017-10-20 MED ORDER — HEPARIN SODIUM (PORCINE) 1000 UNIT/ML IJ SOLN
INTRAMUSCULAR | Status: DC | PRN
  Administered 2017-10-20: 8000 [IU] via INTRAVENOUS

## 2017-10-20 MED ORDER — IOPAMIDOL (ISOVUE-370) INJECTION 76%
INTRAVENOUS | Status: DC | PRN
  Administered 2017-10-20: 3 mL

## 2017-10-20 MED ORDER — LEVOTHYROXINE SODIUM 125 MCG PO TABS
125.0000 ug | ORAL_TABLET | Freq: Every day | ORAL | Status: DC
  Administered 2017-10-21: 125 ug via ORAL
  Filled 2017-10-20: qty 1

## 2017-10-20 MED ORDER — ASPIRIN EC 81 MG PO TBEC
81.0000 mg | DELAYED_RELEASE_TABLET | Freq: Every day | ORAL | Status: DC
  Administered 2017-10-20: 21:00:00 81 mg via ORAL
  Filled 2017-10-20: qty 1

## 2017-10-20 MED ORDER — FENTANYL CITRATE (PF) 100 MCG/2ML IJ SOLN
INTRAMUSCULAR | Status: DC | PRN
  Administered 2017-10-20: 12.5 ug via INTRAVENOUS
  Administered 2017-10-20: 25 ug via INTRAVENOUS
  Administered 2017-10-20 (×4): 12.5 ug via INTRAVENOUS

## 2017-10-20 MED ORDER — HEPARIN (PORCINE) IN NACL 2-0.9 UNIT/ML-% IJ SOLN
INTRAMUSCULAR | Status: AC | PRN
  Administered 2017-10-20: 500 mL

## 2017-10-20 MED ORDER — ONDANSETRON HCL 4 MG/2ML IJ SOLN: 4.0000 mg | Freq: Four times a day (QID) | INTRAMUSCULAR | Status: DC | PRN

## 2017-10-20 MED ORDER — NAPROXEN SODIUM 220 MG PO TABS
220.0000 mg | ORAL_TABLET | Freq: Two times a day (BID) | ORAL | Status: DC | PRN
Start: 2017-10-20 — End: 2017-10-20

## 2017-10-20 MED ORDER — NIACIN 500 MG PO TABS
500.0000 mg | ORAL_TABLET | Freq: Every day | ORAL | Status: DC
  Administered 2017-10-20: 21:00:00 500 mg via ORAL
  Filled 2017-10-20: qty 1

## 2017-10-20 MED ORDER — CARVEDILOL 12.5 MG PO TABS
12.5000 mg | ORAL_TABLET | Freq: Two times a day (BID) | ORAL | Status: DC
  Administered 2017-10-20 – 2017-10-21 (×2): 12.5 mg via ORAL
  Filled 2017-10-20 (×3): qty 1

## 2017-10-20 MED ORDER — POLYETHYL GLYCOL-PROPYL GLYCOL 0.4-0.3 % OP SOLN: 1.0000 [drp] | Freq: Two times a day (BID) | OPHTHALMIC | Status: DC | PRN

## 2017-10-20 MED ORDER — SODIUM CHLORIDE 0.9 % IV SOLN: 250.0000 mL | INTRAVENOUS | Status: DC | PRN

## 2017-10-20 MED ORDER — ADULT MULTIVITAMIN W/MINERALS CH: 1.0000 | ORAL_TABLET | Freq: Every day | ORAL | Status: DC

## 2017-10-20 MED ORDER — SODIUM CHLORIDE 0.9% FLUSH
3.0000 mL | Freq: Two times a day (BID) | INTRAVENOUS | Status: DC
  Administered 2017-10-20: 21:00:00 3 mL via INTRAVENOUS

## 2017-10-20 MED ORDER — HEPARIN SODIUM (PORCINE) 1000 UNIT/ML IJ SOLN
INTRAMUSCULAR | Status: AC
  Filled 2017-10-20: qty 1

## 2017-10-20 MED ORDER — FLUTICASONE FUROATE-VILANTEROL 100-25 MCG/INH IN AEPB
1.0000 | INHALATION_SPRAY | Freq: Every day | RESPIRATORY_TRACT | Status: DC
  Administered 2017-10-21: 09:00:00 1 via RESPIRATORY_TRACT
  Filled 2017-10-20: qty 28

## 2017-10-20 MED ORDER — HEPARIN (PORCINE) IN NACL 2-0.9 UNIT/ML-% IJ SOLN
INTRAMUSCULAR | Status: AC
  Filled 2017-10-20: qty 500

## 2017-10-20 MED ORDER — SODIUM CHLORIDE 0.9% FLUSH: 3.0000 mL | INTRAVENOUS | Status: DC | PRN

## 2017-10-20 MED ORDER — ACETAMINOPHEN 325 MG PO TABS: 650.0000 mg | ORAL_TABLET | ORAL | Status: DC | PRN

## 2017-10-20 SURGICAL SUPPLY — 20 items
BAG SNAP BAND KOVER 36X36 (MISCELLANEOUS) ×3 IMPLANT
CATH EZ STEER NAV 4MM D-F CUR (ABLATOR) ×3 IMPLANT
CATH HEX JOS 2-5-2 65CM 6F REP (CATHETERS) ×3 IMPLANT
CATH JOSEPH QUAD ALLRED 6F REP (CATHETERS) ×3 IMPLANT
CATH JOSEPHSON QUAD-ALLRED 6FR (CATHETERS) ×3 IMPLANT
CATH QUAD JOSEPHSON 5FR REPROC (CATHETERS) IMPLANT
CATH SOUNDSTAR 3D IMAGING (CATHETERS) ×3 IMPLANT
COVER SWIFTLINK CONNECTOR (BAG) ×3 IMPLANT
NEEDLE TRANSEP BRK 71CM 407200 (NEEDLE) ×3 IMPLANT
PACK EP LATEX FREE (CUSTOM PROCEDURE TRAY) ×2
PACK EP LF (CUSTOM PROCEDURE TRAY) ×1 IMPLANT
PAD DEFIB LIFELINK (PAD) ×3 IMPLANT
PATCH CARTO3 (PAD) ×3 IMPLANT
SHEATH AVANTI 11F 11CM (SHEATH) ×3 IMPLANT
SHEATH PINNACLE 6F 10CM (SHEATH) ×6 IMPLANT
SHEATH PINNACLE 7F 10CM (SHEATH) ×3 IMPLANT
SHEATH PINNACLE 8F 10CM (SHEATH) ×3 IMPLANT
SHEATH PINNACLE 9F 10CM (SHEATH) ×3 IMPLANT
SHEATH SWARTZ TS SL2 63CM 8.5F (SHEATH) ×3 IMPLANT
SHIELD RADPAD SCOOP 12X17 (MISCELLANEOUS) ×3 IMPLANT

## 2017-10-20 NOTE — H&P (Signed)
HPI [name] returns today for ongoing evaluation and management of his biV PPM. He also has a h/o atrial tachycardia. He does not have palpitations but has episodes where he feels like his heart is racing and he is fatigued. No syncope. He had initially been treated with flecainide but developed leg pain and numbness and ultimately had to stop the flecainide. Allergies  Allergen Reactions  . Spironolactone Other (See Comments)    gynecomastia           Current Outpatient Medications  Medication Sig Dispense Refill  . aspirin 81 MG tablet Take 81 mg by mouth daily.     . calcium-vitamin D (OSCAL WITH D) 500-200 MG-UNIT tablet Take 1 tablet by mouth daily.    . carvedilol (COREG) 12.5 MG tablet Take 1 tablet (12.5 mg total) by mouth 2 (two) times daily. 60 tablet 3  . eplerenone (INSPRA) 25 MG tablet Take 1 tablet (25 mg total) by mouth daily. 90 tablet 3  . fluticasone furoate-vilanterol (BREO ELLIPTA) 100-25 MCG/INH AEPB Inhale 1 puff into the lungs daily. 60 each 11  . furosemide (LASIX) 20 MG tablet Take 2 tablets (40 mg total) by mouth 2 (two) times daily. 120 tablet 2  . levothyroxine (SYNTHROID, LEVOTHROID) 125 MCG tablet Take 125 mcg by mouth daily before breakfast.    . Multiple Vitamins-Minerals (MULTIVITAMIN WITH MINERALS) tablet Take 1 tablet by mouth daily.    . niacin 500 MG tablet Take 500 mg by mouth daily.    . simvastatin (ZOCOR) 20 MG tablet Take 1 tablet (20 mg total) by mouth at bedtime. 90 tablet 3  . trandolapril (MAVIK) 2 MG tablet Take 1 tablet (2 mg total) by mouth daily. 90 tablet 3  . VENTOLIN HFA 108 (90 Base) MCG/ACT inhaler INHALE TWO PUFFS BY MOUTH EVERY 6 HOURS AS NEEDED FOR WHEEZING OR  SHORTNESS  OF  BREATH 18 each 3   No current facility-administered medications for this visit.          Past Medical History:  Diagnosis Date  . AICD (automatic cardioverter/defibrillator) present   . Aortic insufficiency     moderate aortic  ins moderate/asymptomatic/normal LV cavity size.   . Carotid artery disease ([place])     less than 50% stenosis bilaterally  . Drug-induced gynecomastia     secondary to spironolactone  . Dyslipidemia   . ICD (implantable cardiac defibrillator), biventricular, in situ     Medtronic model  [id number]  . Ischemic cardiomyopathy     status post non-ST elevation microinfarction stent to the right coronary artery is a   . LV dysfunction     NYHA class I/prior ejection fraction 20-25% and an ejection fraction 7 2009 50-55%, ejection fraction 60% bedside echocardiogram July 2012   . PONV (postoperative nausea and vomiting)   . Pulmonary embolism ([facility])    Prior history of pulmonary embolism off Coumadin.  . Ventricular tachycardia ([place])    Inducible ventricular polymorphic tachycardia status post ICD followed by upgrade with CRT-D 2007, battery change at 01/31/2011    ROS:   All systems reviewed and negative except as noted in the HPI.        Past Surgical History:  Procedure Laterality Date  . CARDIAC CATHETERIZATION    . CARDIAC CATHETERIZATION N/A 12/10/2015   Procedure: Right/Left Heart Cath and Coronary Angiography;  Surgeon: Peter M Martinique, MD;  Location: [place] [facility];  Service: Cardiovascular;  Laterality: N/A;  . CARDIAC DEFIBRILLATOR PLACEMENT  medtronic, remote - yes  . CATARACT EXTRACTION W/PHACO Left 08/25/2014   Procedure: CATARACT EXTRACTION PHACO AND INTRAOCULAR LENS PLACEMENT (IOC);  Surgeon: Tonny Branch, MD;  Location: [facility];  Service: Ophthalmology;  Laterality: Left;  CDE 5.79  . CATARACT EXTRACTION W/PHACO Right 09/18/2014   Procedure: CATARACT EXTRACTION PHACO AND INTRAOCULAR LENS PLACEMENT RIGHT EYE;  Surgeon: Tonny Branch, MD;  Location: [facility];  Service: Ophthalmology;  Laterality: Right;  CDE:3.35  . CORONARY STENT PLACEMENT    . EP IMPLANTABLE DEVICE N/A 05/03/2016   Procedure: BIV PPM Generator Changeout;  Surgeon: Evans Lance,  MD;  Location: [place] [facility];  Service: Cardiovascular;  Laterality: N/A;  . INGUINAL HERNIA REPAIR    . left breast biopsy    . RETROPUBIC PROSTATECTOMY    . THORACOTOMY     left  . THYROID SURGERY    . THYROIDECTOMY            Family History  Problem Relation Age of Onset  . Diabetes type I Sister        daughter   . Lung cancer Brother        several types of cancer  . Lung cancer Brother        several types of cancer  . Lung cancer Brother   . Clotting disorder Neg Hx        also no repiratory disease   . Prostate cancer Neg Hx      Social History        Socioeconomic History  . Marital status: Married    Spouse name: Not on file  . Number of children: 2  . Years of education: Not on file  . Highest education level: Not on file  Social Needs  . Financial resource strain: Not on file  . Food insecurity - worry: Not on file  . Food insecurity - inability: Not on file  . Transportation needs - medical: Not on file  . Transportation needs - non-medical: Not on file  Occupational History  . Occupation: Development worker, community - self employed    Employer: RETIRED  Tobacco Use  . Smoking status: Former Smoker    Packs/day: 1.00    Years: 43.00    Pack years: 43.00    Types: Cigarettes    Start date: 10/17/1957    Last attempt to quit: 12/13/2000    Years since quitting: 16.7  . Smokeless tobacco: Never Used  . Tobacco comment: started at age 37  Substance and Sexual Activity  . Alcohol use: No    Alcohol/week: 0.0 oz  . Drug use: No  . Sexual activity: Not Currently  Other Topics Concern  . Not on file  Social History Narrative   Self employed Development worker, community; 2 daughters.      BP (!) 142/72   Pulse 66   Ht 5\' 9"  (1.753 m)   Wt 140 lb 3.2 oz (63.6 kg)   SpO2 90%   BMI 20.70 kg/m   Physical Exam:  Well appearing 77 yo man, NAD HEENT: Unremarkable Neck:  No JVD, no thyromegally Lymphatics:  No  adenopathy Back:  No CVA tenderness Lungs:  Clear except for rales in the bases. HEART:  Regular rate rhythm, no murmurs, no rubs, no clicks Abd:  soft, positive bowel sounds, no organomegally, no rebound, no guarding Ext:  2 plus pulses, no edema, no cyanosis, no clubbing Skin:  No rashes no nodules Neuro:  CN II through XII intact, motor grossly intact  DEVICE  Normal device function.  See PaceArt for details.   Assess/Plan: 1. Atrial tachycardia - I have discussed the treatment options with the patient and his wife and he would like to proceed with catheter ablation of his atrial tachycardia. We also discussed medical options with Propafenone. This will be scheduled in the next few weeks. 2. Chronic diastolic heart failure - he has class II when he is in sinus rhythm, and class III when he is in atrial tachycardia. He will continue a low-sodium diet and his current medical therapy. 3. Biventricular pacemaker - his Medtronic device has been interrogated today and found to work satisfactorily. We'll plan to recheck in several months. 4. COPD - he will continue his bronchodilators. I encouraged the patient to avoid patients who are sick.  [name], M.D.  EP Attending  Patient seen and examined. Since prior clinic visit, no change in the history, exam, assessment and plan.   [name].D.

## 2017-10-20 NOTE — Discharge Instructions (Signed)
No driving for 4 days. No lifting over 5 lbs for 1 week. No sexual activity for 1 week. You may return to work in 1 week. Keep procedure site clean & dry. If you notice increased pain, swelling, bleeding or pus, call/return!  You may shower, but no soaking baths/hot tubs/pools for 1 week.  ° ° °

## 2017-10-20 NOTE — Progress Notes (Signed)
Site area: 3 rt fv sheaths Site Prior to Removal:  Level 0 Pressure Applied For: 25 minutes Manual:   yes Patient Status During Pull:  stable Post Pull Site:  Level 0 Post Pull Instructions Given:  yes Post Pull Pulses Present: palpable Dressing Applied:  Gauze and tegaderm Bedrest begins @ 1310 Comments: IV saline locked

## 2017-10-20 NOTE — Discharge Summary (Signed)
ELECTROPHYSIOLOGY PROCEDURE DISCHARGE SUMMARY    Patient ID: [name],  MRN: [id number], DOB/AGE: Mar 09, 1941 77 y.o.  Admit date: 10/20/2017 Discharge date: 10/21/2017  Primary Care Physician: Glenda Chroman, MD Electrophysiologist: Lovena Le  Primary Discharge Diagnosis:  1.  Atrial tachycardia s/p EPS/ablation this admission  Secondary Diagnosis: 1.  ICM 2.  Chronic systolic heart failure s/p CRTD 3.  COPD 4.  CAD 5.  Prior PE   Allergies  Allergen Reactions  . Spironolactone Other (See Comments)    gynecomastia    Procedures This Admission:  1.  Electrophysiology study and radiofrequency catheter ablation on 10/20/17 by Dr Lovena Le.  See op not for full details. There were no early apparent complications.  Brief HPI:  [name] is a 77 y.o. male with the above past medical history.  He has had persistent atrial tachycardia that has been symptomatic. Risks, benefits to ablation were reviewed with the patient who wished to proceed.   Hospital Course:  The patient was admitted and underwent EPS/ablation on 10/20/17 by Dr Lovena Le with details as outlined above. He was monitored on telemetry overnight which demonstrated sinus with a paced rhythm. Groin and neck incisions were without complication. He was seen by Dr Wynonia Lawman and considered stable for discharge to home.  He will be seen by Dr Lovena Le in 4 weeks for post ablation follow up.   Physical Exam: Vitals:   10/21/17 0450 10/21/17 0657 10/21/17 0723 10/21/17 0832  BP: (!) 154/69  (!) 140/58   Pulse: 73  65   Resp: (!) 22  (!) 24   Temp: 97.8 F (36.6 C)  98.3 F (36.8 C)   TempSrc: Oral  Oral   SpO2: 93%  96% 98%  Weight:  61.3 kg (135 lb 1.6 oz)    Height:        GEN- The patient is well appearing, alert and oriented x 3 today.   Lungs- Clear to ausculation bilaterally, normal work of breathing.  No wheezes, rales, rhonchi Heart- Regular rate and rhythm, no murmurs, rubs or gallops  Extremities- no  clubbing, cyanosis, or edema; DP/PT/radial pulses 2+ bilaterally MS- no significant deformity or atrophy Neck and groin cath sites clean and dry.  Labs:   Lab Results  Component Value Date   WBC 7.1 10/18/2017   HGB 14.0 10/18/2017   HCT 42.7 10/18/2017   MCV 90 10/18/2017   PLT 259 10/18/2017    Recent Labs  Lab 10/18/17 1300  NA 144  K 4.2  CL 102  CO2 26  BUN 18  CREATININE 0.98  CALCIUM 9.3  GLUCOSE 85     Discharge Medications:  Allergies as of 10/21/2017      Reactions   Spironolactone Other (See Comments)   gynecomastia      Medication List    TAKE these medications   aspirin EC 81 MG tablet Take 81 mg by mouth at bedtime.   carvedilol 12.5 MG tablet Commonly known as:  COREG TAKE 1 TABLET (12.5 MG TOTAL) BY MOUTH 2 (TWO) TIMES DAILY.   eplerenone 25 MG tablet Commonly known as:  INSPRA Take 1 tablet (25 mg total) by mouth daily.   fluticasone furoate-vilanterol 100-25 MCG/INH Aepb Commonly known as:  BREO ELLIPTA Inhale 1 puff into the lungs daily.   furosemide 20 MG tablet Commonly known as:  LASIX Take 2 tablets (40 mg total) by mouth 2 (two) times daily.   levothyroxine 125 MCG tablet Commonly known as:  SYNTHROID, LEVOTHROID Take 125 mcg by mouth daily before breakfast.   LUBRICANT EYE DROPS 0.4-0.3 % Soln Generic drug:  Polyethyl Glycol-Propyl Glycol Place 1-2 drops into both eyes 2 (two) times daily as needed (for dry/irritated eyes.).   multivitamin with minerals tablet Take 1 tablet by mouth daily.   naproxen sodium 220 MG tablet Commonly known as:  ALEVE Take 220-440 mg by mouth 2 (two) times daily as needed (for pain.).   niacin 500 MG tablet Take 500 mg by mouth at bedtime.   simvastatin 20 MG tablet Commonly known as:  ZOCOR Take 1 tablet (20 mg total) by mouth at bedtime.   trandolapril 2 MG tablet Commonly known as:  MAVIK Take 1 tablet (2 mg total) by mouth daily.   VENTOLIN HFA 108 (90 Base) MCG/ACT  inhaler Generic drug:  albuterol INHALE TWO PUFFS BY MOUTH EVERY 6 HOURS AS NEEDED FOR WHEEZING OR  SHORTNESS  OF  BREATH       Disposition: Pt is being discharged home today in good condition. Discharge Instructions    Call MD for:  temperature >100.4   Complete by:  As directed    Diet - low sodium heart healthy   Complete by:  As directed    Increase activity slowly   Complete by:  As directed      Follow-up Information    Evans Lance, MD Follow up on 11/23/2017.   Specialty:  Cardiology Why:  at [place] information: [address] [place] [address] [phone]           Duration of Discharge Encounter: Greater than 30 minutes including physician time.  Signed, Chanetta Marshall, NP 10/21/2017 11:04 AM   W. Doristine Church MD [facility] 11:05 AM

## 2017-10-20 NOTE — Progress Notes (Signed)
Site area: rt ij venous sheath Site Prior to Removal:  Level 0 Pressure Applied For: 10 minutes Manual:   yes Patient Status During Pull:  stable Post Pull Site:  Level 0 Post Pull Instructions Given:  yes Post Pull Pulses Present: na Dressing Applied:  Gauze and tegaderm Bedrest begins @  Comments:   

## 2017-10-21 DIAGNOSIS — R000 Tachycardia, unspecified: Secondary | ICD-10-CM | POA: Diagnosis not present

## 2017-10-21 DIAGNOSIS — I2510 Atherosclerotic heart disease of native coronary artery without angina pectoris: Secondary | ICD-10-CM | POA: Diagnosis not present

## 2017-10-21 DIAGNOSIS — J449 Chronic obstructive pulmonary disease, unspecified: Secondary | ICD-10-CM | POA: Diagnosis not present

## 2017-10-21 DIAGNOSIS — Z87891 Personal history of nicotine dependence: Secondary | ICD-10-CM | POA: Diagnosis not present

## 2017-10-21 DIAGNOSIS — Z79899 Other long term (current) drug therapy: Secondary | ICD-10-CM | POA: Diagnosis not present

## 2017-10-21 DIAGNOSIS — I471 Supraventricular tachycardia: Secondary | ICD-10-CM | POA: Diagnosis not present

## 2017-10-21 DIAGNOSIS — Z86711 Personal history of pulmonary embolism: Secondary | ICD-10-CM | POA: Diagnosis not present

## 2017-10-21 DIAGNOSIS — I255 Ischemic cardiomyopathy: Secondary | ICD-10-CM | POA: Diagnosis not present

## 2017-10-21 DIAGNOSIS — Z95810 Presence of automatic (implantable) cardiac defibrillator: Secondary | ICD-10-CM | POA: Diagnosis not present

## 2017-10-21 DIAGNOSIS — Z7982 Long term (current) use of aspirin: Secondary | ICD-10-CM | POA: Diagnosis not present

## 2017-10-21 DIAGNOSIS — E785 Hyperlipidemia, unspecified: Secondary | ICD-10-CM | POA: Diagnosis not present

## 2017-10-21 DIAGNOSIS — Z79890 Hormone replacement therapy (postmenopausal): Secondary | ICD-10-CM | POA: Diagnosis not present

## 2017-10-21 DIAGNOSIS — I5022 Chronic systolic (congestive) heart failure: Secondary | ICD-10-CM | POA: Diagnosis not present

## 2017-10-21 MED ORDER — OFF THE BEAT BOOK
Freq: Once | Status: AC
  Administered 2017-10-21: 02:00:00
  Filled 2017-10-21: qty 1

## 2017-10-21 NOTE — Progress Notes (Signed)
Patient ambulating in hall with steady gait, independently. No s/s intolerance. No chest pain, shortness of breath, tolerated well. Ambulated from room to [employer] and back (approx 600 feet). Dressing checked before and after ambulation and remains dry and intact, site soft with no hemtoma, no bleeding.

## 2017-10-23 ENCOUNTER — Encounter (HOSPITAL_COMMUNITY): Payer: Self-pay | Admitting: Internal Medicine

## 2017-10-23 MED FILL — Bupivacaine HCl Preservative Free (PF) Inj 0.25%: INTRAMUSCULAR | Qty: 60 | Status: AC

## 2017-10-25 ENCOUNTER — Encounter: Payer: Medicare Other | Admitting: Internal Medicine

## 2017-10-27 ENCOUNTER — Telehealth: Payer: Self-pay

## 2017-10-27 NOTE — Telephone Encounter (Signed)
Returned call to patient as requested by voice mail message due to patient was having shortness of breath.  Spoke with patient.  He stated he feels fine now and only had a feeling of shortness of breath when he woke up this morning.  He said weight is stable at 130.5 lbs and no swelling.  Advised that he has the feeling of shortness of breath over the weekend to take extra 20 mg Furosemide if needed for 2 days only.  He verbalized understanding.  Remote transmission sent today and reviewed.  No fluid showing for the last 3 days but did have some fluid on 1/4/201919 and 11/05/2017.  Will repeat remote transmission on 10/30/2017.

## 2017-10-30 ENCOUNTER — Ambulatory Visit (INDEPENDENT_AMBULATORY_CARE_PROVIDER_SITE_OTHER): Payer: Medicare Other

## 2017-10-30 DIAGNOSIS — Z950 Presence of cardiac pacemaker: Secondary | ICD-10-CM | POA: Diagnosis not present

## 2017-10-30 DIAGNOSIS — I5022 Chronic systolic (congestive) heart failure: Secondary | ICD-10-CM | POA: Diagnosis not present

## 2017-10-31 DIAGNOSIS — Z6821 Body mass index (BMI) 21.0-21.9, adult: Secondary | ICD-10-CM | POA: Diagnosis not present

## 2017-10-31 DIAGNOSIS — I10 Essential (primary) hypertension: Secondary | ICD-10-CM | POA: Diagnosis not present

## 2017-10-31 DIAGNOSIS — I5023 Acute on chronic systolic (congestive) heart failure: Secondary | ICD-10-CM | POA: Diagnosis not present

## 2017-10-31 DIAGNOSIS — Z79899 Other long term (current) drug therapy: Secondary | ICD-10-CM | POA: Diagnosis not present

## 2017-10-31 DIAGNOSIS — E7800 Pure hypercholesterolemia, unspecified: Secondary | ICD-10-CM | POA: Diagnosis not present

## 2017-10-31 DIAGNOSIS — Z7189 Other specified counseling: Secondary | ICD-10-CM | POA: Diagnosis not present

## 2017-10-31 DIAGNOSIS — Z1331 Encounter for screening for depression: Secondary | ICD-10-CM | POA: Diagnosis not present

## 2017-10-31 DIAGNOSIS — Z125 Encounter for screening for malignant neoplasm of prostate: Secondary | ICD-10-CM | POA: Diagnosis not present

## 2017-10-31 DIAGNOSIS — E039 Hypothyroidism, unspecified: Secondary | ICD-10-CM | POA: Diagnosis not present

## 2017-10-31 DIAGNOSIS — Z1211 Encounter for screening for malignant neoplasm of colon: Secondary | ICD-10-CM | POA: Diagnosis not present

## 2017-10-31 DIAGNOSIS — Z1339 Encounter for screening examination for other mental health and behavioral disorders: Secondary | ICD-10-CM | POA: Diagnosis not present

## 2017-10-31 DIAGNOSIS — Z0000 Encounter for general adult medical examination without abnormal findings: Secondary | ICD-10-CM | POA: Diagnosis not present

## 2017-10-31 DIAGNOSIS — R5383 Other fatigue: Secondary | ICD-10-CM | POA: Diagnosis not present

## 2017-10-31 DIAGNOSIS — Z299 Encounter for prophylactic measures, unspecified: Secondary | ICD-10-CM | POA: Diagnosis not present

## 2017-10-31 DIAGNOSIS — J439 Emphysema, unspecified: Secondary | ICD-10-CM | POA: Diagnosis not present

## 2017-10-31 NOTE — Progress Notes (Signed)
EPIC Encounter for ICM Monitoring  Patient Name: [name] is a 77 y.o. male Date: 10/31/2017 Primary Care Physican: Glenda Chroman, MD Primary Cardiologist:Taylor Electrophysiologist: Lovena Le Dry Weight: 131lbs  Bi-V Pacing: 98.3%       Spoke with wife.  Heart Failure questions reviewed, pt asymptomatic.   Thoracic impedance normal.  Prescribed dosage: Furosemide 20 mg2tablets (40 mg total) twice a day.  Labs:  10/18/2017 Creatinine 0.98, BUN 18, Potassium 4.2, Sodium 144, EGFR 75-87 09/15/2017 Creatinine 0.99, BUN 18, Potassium 4.1, Sodium 145, EGFR 74-86 06/12/2017 Creatinine 1.08, BUN 23, Potassium 4.1, Sodium 141, EGFR 67-77 02/22/2017 Creatinine 1.02, BUN 19, Potassium 4.0, Sodium 147, EGFR 72-83 10/25/2016 Creatinine 1.12, BUN 16, Potassium 4.4, Sodium 142, EGFR 63-73 10/09/2016 Creatinine 1.03, BUN 22, Potassium 4.2, Sodium 140, EGFR >60 (during hospitalization) 10/08/2016 Creatinine 1.13, BUN 25, Potassium 4.0, Sodium 140, EGFR >60 (during hospitalization) 10/05/2016 Creatinine 1.28, BUN 30, Potassium 3.8, Sodium 140, EGFR 53-60 (during hospitalization) 07/28/2016 Creatinine 0.98, BUN 12, Potassium 4.5, Sodium 141  05/16/2016 Creatinine 1.09, BUN 24, Potassium 4.1, Sodium 140 04/28/2016 Creatinine 1.79, BUN 33, Potassium 4.6, Sodium 140  12/08/2015 Creatinine 1.00, BUN 16, Potassium 4.2, Sodium 141  Recommendations: No changes.   Encouraged to call for fluid symptoms.  Follow-up plan: ICM clinic phone appointment on 12/25/2017.  Office defib check scheduled for 11/23/2017 with Dr. Lovena Le.  Copy of ICM check sent to Dr. Lovena Le.   3 month ICM trend: 10/30/2017    AT/AF    1 Year ICM trend:       Rosalene Billings, RN 10/31/2017 11:07 AM

## 2017-11-09 DIAGNOSIS — H02112 Cicatricial ectropion of right lower eyelid: Secondary | ICD-10-CM | POA: Diagnosis not present

## 2017-11-09 DIAGNOSIS — H04562 Stenosis of left lacrimal punctum: Secondary | ICD-10-CM | POA: Diagnosis not present

## 2017-11-09 DIAGNOSIS — H04222 Epiphora due to insufficient drainage, left lacrimal gland: Secondary | ICD-10-CM | POA: Diagnosis not present

## 2017-11-09 DIAGNOSIS — H05222 Edema of left orbit: Secondary | ICD-10-CM | POA: Diagnosis not present

## 2017-11-09 DIAGNOSIS — H02115 Cicatricial ectropion of left lower eyelid: Secondary | ICD-10-CM | POA: Diagnosis not present

## 2017-11-09 DIAGNOSIS — H04221 Epiphora due to insufficient drainage, right lacrimal gland: Secondary | ICD-10-CM | POA: Diagnosis not present

## 2017-11-09 DIAGNOSIS — H02532 Eyelid retraction right lower eyelid: Secondary | ICD-10-CM | POA: Diagnosis not present

## 2017-11-09 DIAGNOSIS — D485 Neoplasm of uncertain behavior of skin: Secondary | ICD-10-CM | POA: Diagnosis not present

## 2017-11-09 DIAGNOSIS — H04561 Stenosis of right lacrimal punctum: Secondary | ICD-10-CM | POA: Diagnosis not present

## 2017-11-09 DIAGNOSIS — H02535 Eyelid retraction left lower eyelid: Secondary | ICD-10-CM | POA: Diagnosis not present

## 2017-11-09 DIAGNOSIS — H04223 Epiphora due to insufficient drainage, bilateral lacrimal glands: Secondary | ICD-10-CM | POA: Diagnosis not present

## 2017-11-23 ENCOUNTER — Encounter: Payer: Self-pay | Admitting: *Deleted

## 2017-11-23 ENCOUNTER — Telehealth: Payer: Self-pay | Admitting: Internal Medicine

## 2017-11-23 ENCOUNTER — Encounter: Payer: Self-pay | Admitting: Internal Medicine

## 2017-11-23 ENCOUNTER — Ambulatory Visit (INDEPENDENT_AMBULATORY_CARE_PROVIDER_SITE_OTHER): Payer: Medicare Other | Admitting: Internal Medicine

## 2017-11-23 VITALS — BP 132/70 | HR 66 | Ht 69.0 in | Wt 138.0 lb

## 2017-11-23 DIAGNOSIS — I5022 Chronic systolic (congestive) heart failure: Secondary | ICD-10-CM | POA: Diagnosis not present

## 2017-11-23 DIAGNOSIS — I255 Ischemic cardiomyopathy: Secondary | ICD-10-CM | POA: Diagnosis not present

## 2017-11-23 DIAGNOSIS — I2589 Other forms of chronic ischemic heart disease: Secondary | ICD-10-CM

## 2017-11-23 DIAGNOSIS — Z950 Presence of cardiac pacemaker: Secondary | ICD-10-CM | POA: Diagnosis not present

## 2017-11-23 DIAGNOSIS — I471 Supraventricular tachycardia: Secondary | ICD-10-CM

## 2017-11-23 DIAGNOSIS — R410 Disorientation, unspecified: Secondary | ICD-10-CM | POA: Diagnosis not present

## 2017-11-23 LAB — CUP PACEART INCLINIC DEVICE CHECK
Brady Statistic AP VS Percent: 0.04 %
Brady Statistic AS VP Percent: 51.7 %
Brady Statistic AS VS Percent: 0.26 %
Brady Statistic RV Percent Paced: 98.04 %
Date Time Interrogation Session: 20190207152841
Implantable Lead Implant Date: 20071015
Implantable Lead Location: 753859
Implantable Lead Model: 6949
Lead Channel Impedance Value: 228 Ohm
Lead Channel Impedance Value: 456 Ohm
Lead Channel Impedance Value: 589 Ohm
Lead Channel Impedance Value: 627 Ohm
Lead Channel Sensing Intrinsic Amplitude: 12.125 mV
Lead Channel Sensing Intrinsic Amplitude: 5.5 mV
Lead Channel Setting Pacing Amplitude: 3 V
Lead Channel Setting Pacing Pulse Width: 0.6 ms
Lead Channel Setting Pacing Pulse Width: 1 ms
Lead Channel Setting Sensing Sensitivity: 2.8 mV
MDC IDC LEAD IMPLANT DT: 20050810
MDC IDC LEAD IMPLANT DT: 20050810
MDC IDC LEAD LOCATION: 753858
MDC IDC LEAD LOCATION: 753860
MDC IDC MSMT BATTERY REMAINING LONGEVITY: 44 mo
MDC IDC MSMT BATTERY VOLTAGE: 2.99 V
MDC IDC MSMT LEADCHNL LV IMPEDANCE VALUE: 399 Ohm
MDC IDC MSMT LEADCHNL LV IMPEDANCE VALUE: 532 Ohm
MDC IDC MSMT LEADCHNL RA IMPEDANCE VALUE: 475 Ohm
MDC IDC MSMT LEADCHNL RA IMPEDANCE VALUE: 532 Ohm
MDC IDC MSMT LEADCHNL RA PACING THRESHOLD AMPLITUDE: 0.5 V
MDC IDC MSMT LEADCHNL RA PACING THRESHOLD PULSEWIDTH: 0.4 ms
MDC IDC MSMT LEADCHNL RA SENSING INTR AMPL: 4.75 mV
MDC IDC MSMT LEADCHNL RV IMPEDANCE VALUE: 456 Ohm
MDC IDC MSMT LEADCHNL RV PACING THRESHOLD AMPLITUDE: 1.5 V
MDC IDC MSMT LEADCHNL RV PACING THRESHOLD PULSEWIDTH: 0.4 ms
MDC IDC MSMT LEADCHNL RV SENSING INTR AMPL: 11.125 mV
MDC IDC PG IMPLANT DT: 20170718
MDC IDC SET LEADCHNL LV PACING AMPLITUDE: 3.75 V
MDC IDC SET LEADCHNL RA PACING AMPLITUDE: 1.5 V
MDC IDC STAT BRADY AP VP PERCENT: 48 %
MDC IDC STAT BRADY RA PERCENT PACED: 47.13 %

## 2017-11-23 MED ORDER — EPLERENONE 25 MG PO TABS: 25.0000 mg | ORAL_TABLET | Freq: Every day | ORAL | 11 refills | Status: DC

## 2017-11-23 NOTE — Progress Notes (Signed)
HPI [name] returns today for ongoing evaluation and management of CHB, HTN, chronic heart failure and atrial tachycardia. He underwent ablation of his atrial tachycardia several weeks ago. He was found at that time to have a Left atrial focal tachycardia and was successfully ablated after transeptal puncture. He has not felt his heart race. His main complaint today is that he has had weakness and confusion which has been interimittent. No syncope or other focal weaknesses. Allergies  Allergen Reactions  . Spironolactone Other (See Comments)    gynecomastia     Current Outpatient Medications  Medication Sig Dispense Refill  . aspirin EC 81 MG tablet Take 81 mg by mouth at bedtime.    . carvedilol (COREG) 12.5 MG tablet TAKE 1 TABLET (12.5 MG TOTAL) BY MOUTH 2 (TWO) TIMES DAILY. 180 tablet 3  . eplerenone (INSPRA) 25 MG tablet Take 1 tablet (25 mg total) by mouth daily. 30 tablet 11  . fluticasone furoate-vilanterol (BREO ELLIPTA) 100-25 MCG/INH AEPB Inhale 1 puff into the lungs daily. 60 each 11  . furosemide (LASIX) 20 MG tablet Take 2 tablets (40 mg total) by mouth 2 (two) times daily. 120 tablet 2  . levothyroxine (SYNTHROID, LEVOTHROID) 125 MCG tablet Take 125 mcg by mouth daily before breakfast.    . Multiple Vitamins-Minerals (MULTIVITAMIN WITH MINERALS) tablet Take 1 tablet by mouth daily.    . naproxen sodium (ALEVE) 220 MG tablet Take 220-440 mg by mouth 2 (two) times daily as needed (for pain.).    Marland Kitchen niacin 500 MG tablet Take 500 mg by mouth at bedtime.     Vladimir Faster Glycol-Propyl Glycol (LUBRICANT EYE DROPS) 0.4-0.3 % SOLN Place 1-2 drops into both eyes 2 (two) times daily as needed (for dry/irritated eyes.).    Marland Kitchen simvastatin (ZOCOR) 20 MG tablet Take 1 tablet (20 mg total) by mouth at bedtime. 90 tablet 3  . trandolapril (MAVIK) 2 MG tablet Take 1 tablet (2 mg total) by mouth daily. 90 tablet 3  . VENTOLIN HFA 108 (90 Base) MCG/ACT inhaler INHALE TWO PUFFS BY MOUTH  EVERY 6 HOURS AS NEEDED FOR WHEEZING OR  SHORTNESS  OF  BREATH 18 each 3   No current facility-administered medications for this visit.      Past Medical History:  Diagnosis Date  . AICD (automatic cardioverter/defibrillator) present   . Aortic insufficiency     moderate aortic ins moderate/asymptomatic/normal LV cavity size.   . Carotid artery disease ([place])     less than 50% stenosis bilaterally  . Carotid artery disease ([place])   . Chronic systolic CHF (congestive heart failure) ([place])   . Coronary artery disease   . Drug-induced gynecomastia     secondary to spironolactone  . Dyslipidemia   . Hypertension   . ICD (implantable cardiac defibrillator), biventricular, in situ     Medtronic model  [id number]  . Ischemic cardiomyopathy     status post non-ST elevation microinfarction stent to the right coronary artery is a   . LV dysfunction     NYHA class I/prior ejection fraction 20-25% and an ejection fraction 7 2009 50-55%, ejection fraction 60% bedside echocardiogram July 2012   . PONV (postoperative nausea and vomiting)   . Pulmonary embolism ([facility])    Prior history of pulmonary embolism off Coumadin.  . Ventricular tachycardia ([place])    Inducible ventricular polymorphic tachycardia status post ICD followed by upgrade with CRT-D 2007, battery change at 01/31/2011    ROS:  All systems reviewed and negative except as noted in the HPI.   Past Surgical History:  Procedure Laterality Date  . ABLATION OF DYSRHYTHMIC FOCUS  10/20/2017   atrial tach  . ATRIAL TACH ABLATION N/A 10/20/2017   Procedure: ATRIAL TACH ABLATION;  Surgeon: Evans Lance, MD;  Location: [place] [facility];  Service: Cardiovascular;  Laterality: N/A;  . CARDIAC CATHETERIZATION    . CARDIAC CATHETERIZATION N/A 12/10/2015   Procedure: Right/Left Heart Cath and Coronary Angiography;  Surgeon: Peter M Martinique, MD;  Location: [place] [facility];  Service: Cardiovascular;  Laterality: N/A;  . CARDIAC DEFIBRILLATOR  PLACEMENT     medtronic, remote - yes  . CATARACT EXTRACTION W/PHACO Left 08/25/2014   Procedure: CATARACT EXTRACTION PHACO AND INTRAOCULAR LENS PLACEMENT (IOC);  Surgeon: Tonny Branch, MD;  Location: [facility];  Service: Ophthalmology;  Laterality: Left;  CDE 5.79  . CATARACT EXTRACTION W/PHACO Right 09/18/2014   Procedure: CATARACT EXTRACTION PHACO AND INTRAOCULAR LENS PLACEMENT RIGHT EYE;  Surgeon: Tonny Branch, MD;  Location: [facility];  Service: Ophthalmology;  Laterality: Right;  CDE:3.35  . CORONARY STENT PLACEMENT    . EP IMPLANTABLE DEVICE N/A 05/03/2016   Procedure: BIV PPM Generator Changeout;  Surgeon: Evans Lance, MD;  Location: [place] [facility];  Service: Cardiovascular;  Laterality: N/A;  . INGUINAL HERNIA REPAIR    . left breast biopsy    . RETROPUBIC PROSTATECTOMY    . THORACOTOMY     left  . THYROID SURGERY    . THYROIDECTOMY       Family History  Problem Relation Age of Onset  . Diabetes type I Sister        daughter   . Lung cancer Brother        several types of cancer  . Lung cancer Brother        several types of cancer  . Lung cancer Brother   . Clotting disorder Neg Hx        also no repiratory disease   . Prostate cancer Neg Hx      Social History   Socioeconomic History  . Marital status: Married    Spouse name: Not on file  . Number of children: 2  . Years of education: Not on file  . Highest education level: Not on file  Social Needs  . Financial resource strain: Not on file  . Food insecurity - worry: Not on file  . Food insecurity - inability: Not on file  . Transportation needs - medical: Not on file  . Transportation needs - non-medical: Not on file  Occupational History  . Occupation: Development worker, community - self employed    Employer: RETIRED  Tobacco Use  . Smoking status: Former Smoker    Packs/day: 1.00    Years: 43.00    Pack years: 43.00    Types: Cigarettes    Start date: 10/17/1957    Last attempt to quit: 12/13/2000    Years since quitting:  16.9  . Smokeless tobacco: Never Used  . Tobacco comment: started at age 74  Substance and Sexual Activity  . Alcohol use: No    Alcohol/week: 0.0 oz  . Drug use: No  . Sexual activity: Not Currently  Other Topics Concern  . Not on file  Social History Narrative   Self employed Development worker, community; 2 daughters.      BP 132/70   Pulse 66   Ht 5\' 9"  (1.753 m)   Wt 138 lb (62.6  kg)   BMI 20.38 kg/m   Physical Exam:  Well appearing 77 yo man, NAD HEENT: Unremarkable Neck:  No JVD, no thyromegally Lymphatics:  No adenopathy Back:  No CVA tenderness Lungs:  Clear with no wheezes.  HEART:  Regular rate rhythm, no murmurs, no rubs, no clicks Abd:  soft, positive bowel sounds, no organomegally, no rebound, no guarding Ext:  2 plus pulses, no edema, no cyanosis, no clubbing Skin:  No rashes no nodules Neuro:  CN II through XII intact, motor grossly intact  EKG - NSR with biv pacing  DEVICE  Normal device function.  See PaceArt for details.   Assess/Plan: 1. Dizziness and confusion - his symptoms are transient raising a suspicion that he is having a neurological event. I have recommended he undergo CT of the head with contrast.  2. Atrial tachycardia - interogation of his device demonstrates that he is having no episodes since his ablation. 3. Chronic systolic and diastolic heart failure - his symptoms are class 2. He will continue his current meds. 4. Biv PPM - interogation of his device demonstrates normal function. Will recheck in several months.  Mikle Bosworth.D.

## 2017-11-23 NOTE — Patient Instructions (Addendum)
Medication Instructions:  Your physician recommends that you continue on your current medications as directed. Please refer to the Current Medication list given to you today.  Labwork: None ordered.  Testing/Procedures: Non-Cardiac CT scanning, (CAT scanning), is a noninvasive, special x-ray that produces cross-sectional images of the body using x-rays and a computer. CT scans help physicians diagnose and treat medical conditions. For some CT exams, a contrast material is used to enhance visibility in the area of the body being studied. CT scans provide greater clarity and reveal more details than regular x-ray exams.  Please schedule for a head CT.  Follow-Up: Your physician wants you to follow-up in:  3 months with Dr. Lovena Le.  Remote monitoring is used to monitor your Pacemaker from home. This monitoring reduces the number of office visits required to check your device to one time per year. It allows [place] to keep an eye on the functioning of your device to ensure it is working properly. You are scheduled for a device check from home on 11/27/2017. You may send your transmission at any time that day. If you have a wireless device, the transmission will be sent automatically. After your physician reviews your transmission, you will receive a postcard with your next transmission date.  Any Other Special Instructions Will Be Listed Below (If Applicable).  You are being referred to [facility].  If you need a refill on your cardiac medications before your next appointment, please call your pharmacy.

## 2017-11-23 NOTE — Telephone Encounter (Signed)
New message    Pt c/o medication issue:  1. Name of Medication: eplerenone (INSPRA) 25 MG tablet  2. How are you currently taking this medication (dosage and times per day)? Take 1 tablet (25 mg total) by mouth daily.  3. Are you having a reaction (difficulty breathing--STAT)? No  4. What is your medication issue? Too costly

## 2017-11-24 ENCOUNTER — Ambulatory Visit (INDEPENDENT_AMBULATORY_CARE_PROVIDER_SITE_OTHER)
Admission: RE | Admit: 2017-11-24 | Discharge: 2017-11-24 | Disposition: A | Discharge status: Home / Self Care | Payer: Medicare Other | Source: Ambulatory Visit | Attending: Internal Medicine | Admitting: Internal Medicine

## 2017-11-24 DIAGNOSIS — R410 Disorientation, unspecified: Secondary | ICD-10-CM | POA: Diagnosis not present

## 2017-11-24 MED ORDER — IOPAMIDOL (ISOVUE-370) INJECTION 76%
80.0000 mL | Freq: Once | INTRAVENOUS | Status: AC | PRN
  Administered 2017-11-24: 80 mL via INTRAVENOUS

## 2017-11-24 NOTE — Telephone Encounter (Signed)
Call returned to Pt.  Spoke with wife.  Notified per Dr. Lovena Le, to discontinue Inspra.  If SOB, increased swelling/weight, Pt to take extra lasix. Per wife, Pt is followed by Sharman Cheek, advises to take extra lasix from time to time.  Will get in touch with Margarita Grizzle, notified that Pt is discontinuing Inspira.  Will check back with Pt family in 2 weeks.

## 2017-11-27 ENCOUNTER — Telehealth: Payer: Self-pay | Admitting: Cardiology

## 2017-11-27 ENCOUNTER — Encounter: Payer: Medicare Other | Admitting: *Deleted

## 2017-11-27 NOTE — Telephone Encounter (Signed)
Margarita Grizzle to follow up with Pt 12/07/2017.  Will send update to this nurse at that time.  Will follow up as needed.

## 2017-11-27 NOTE — Telephone Encounter (Signed)
Confirmed remote transmission w/ pt wife.   

## 2017-11-28 DIAGNOSIS — I2510 Atherosclerotic heart disease of native coronary artery without angina pectoris: Secondary | ICD-10-CM | POA: Diagnosis not present

## 2017-11-28 DIAGNOSIS — M159 Polyosteoarthritis, unspecified: Secondary | ICD-10-CM | POA: Diagnosis not present

## 2017-11-28 DIAGNOSIS — J449 Chronic obstructive pulmonary disease, unspecified: Secondary | ICD-10-CM | POA: Diagnosis not present

## 2017-11-28 DIAGNOSIS — E7800 Pure hypercholesterolemia, unspecified: Secondary | ICD-10-CM | POA: Diagnosis not present

## 2017-11-29 ENCOUNTER — Telehealth: Payer: Self-pay | Admitting: Internal Medicine

## 2017-11-29 ENCOUNTER — Encounter: Payer: Self-pay | Admitting: Cardiology

## 2017-11-29 NOTE — Telephone Encounter (Signed)
New Message   Patients wife is calling on behalf of patient. She is inquiring about the CT results from 2/8. Please call to discuss.

## 2017-11-29 NOTE — Telephone Encounter (Signed)
Returned call to Pt wife.  Results of head CT given.  All questions answered. Wife asks about keeping neuro appt?  Notified it was up to her.  Wife indicates understanding.

## 2017-11-29 NOTE — Telephone Encounter (Signed)
Spoke with wife.  Discussed Inspra.  At this time Pt will be managed with furosemide.  Sharman Cheek also following.  Will see how Pt does.   Left message for Alena with [facility].  Notified Inspra has been discontinued at this time d/t copay.  Will manage with furosemide at this point.  Will reassess as needed.  Notified Alena wife in agreement with plan.  Left this nurse name and # if any further questions.

## 2017-11-29 NOTE — Telephone Encounter (Signed)
Pt c/o medication issue:  1. Name of Medication: Eplerenone (Inspra)     :Rep Alena spelled the medication and dosage  2. How are you currently taking this medication (dosage and times per day)?  25mg  1Xday  3. Are you having a reaction (difficulty breathing--STAT)? no  4. What is your medication issue? Can the order go from a 30 day to 90 day and refill amount

## 2017-12-01 ENCOUNTER — Ambulatory Visit (INDEPENDENT_AMBULATORY_CARE_PROVIDER_SITE_OTHER): Payer: Self-pay

## 2017-12-01 DIAGNOSIS — I5022 Chronic systolic (congestive) heart failure: Secondary | ICD-10-CM

## 2017-12-01 DIAGNOSIS — Z950 Presence of cardiac pacemaker: Secondary | ICD-10-CM

## 2017-12-01 NOTE — Progress Notes (Signed)
EPIC Encounter for ICM Monitoring  Patient Name: [name] is a 77 y.o. male Date: 12/01/2017 Primary Care Physican: Glenda Chroman, MD Primary Cardiologist:Taylor Electrophysiologist: Druscilla Brownie Weight: 135lbs (increased from 131 lbs) Bi-V Pacing: 97.9%       Wife called and reported patient sent remote due to he thinks he has some fluid accumulation.  Heart Failure questions reviewed, pt has shortness of breath and weight gain of 3-4 lbs.  Difficult to get straight answers from wife at times.    Thoracic impedance abnormal suggesting fluid accumulation since 11/15/2017.  Prescribed dosage: Furosemide 20 mg2tablets (40 mg total) twice a day.Reported on 2/15 taking differently-taking 1 tablet in the mornings.   Labs:  10/18/2017 Creatinine 0.98, BUN 18, Potassium 4.2, Sodium 144, EGFR 75-87 09/15/2017 Creatinine 0.99, BUN 18, Potassium 4.1, Sodium 145, EGFR 74-86 06/12/2017 Creatinine 1.08, BUN 23, Potassium 4.1, Sodium 141, EGFR 67-77 02/22/2017 Creatinine 1.02, BUN 19, Potassium 4.0, Sodium 147, EGFR 72-83 10/25/2016 Creatinine 1.12, BUN 16, Potassium 4.4, Sodium 142, EGFR 63-73 10/09/2016 Creatinine 1.03, BUN 22, Potassium 4.2, Sodium 140, EGFR >60 (during hospitalization) 10/08/2016 Creatinine 1.13, BUN 25, Potassium 4.0, Sodium 140, EGFR >60 (during hospitalization) 10/05/2016 Creatinine 1.28, BUN 30, Potassium 3.8, Sodium 140, EGFR 53-60 (during hospitalization) 07/28/2016 Creatinine 0.98, BUN 12, Potassium 4.5, Sodium 141  05/16/2016 Creatinine 1.09, BUN 24, Potassium 4.1, Sodium 140 04/28/2016 Creatinine 1.79, BUN 33, Potassium 4.6, Sodium 140  12/08/2015 Creatinine 1.00, BUN 16, Potassium 4.2, Sodium 141  Recommendations: Advised to take Furosemide 20 mg 2 tablets every AM and 1 tablet every PM x 3 days and then return to dosage that she discussed verbally at office visit with Dr Lovena Le on 2/7.  Advised if symptoms worsen to use ER over the weekend and call  office back if if patient is not better on 12/04/2016. Advised will update Dr Lovena Le and his nurse.  Follow-up plan: ICM clinic phone appointment on 12/07/2017.    Copy of ICM check sent to Dr. Lovena Le.   3 month ICM trend: 12/01/2017    1 Year ICM trend:       Rosalene Billings, RN 12/01/2017 9:20 AM

## 2017-12-05 DIAGNOSIS — Z299 Encounter for prophylactic measures, unspecified: Secondary | ICD-10-CM | POA: Diagnosis not present

## 2017-12-05 DIAGNOSIS — E039 Hypothyroidism, unspecified: Secondary | ICD-10-CM | POA: Diagnosis not present

## 2017-12-05 DIAGNOSIS — Z6837 Body mass index (BMI) 37.0-37.9, adult: Secondary | ICD-10-CM | POA: Diagnosis not present

## 2017-12-05 DIAGNOSIS — J069 Acute upper respiratory infection, unspecified: Secondary | ICD-10-CM | POA: Diagnosis not present

## 2017-12-05 DIAGNOSIS — J449 Chronic obstructive pulmonary disease, unspecified: Secondary | ICD-10-CM | POA: Diagnosis not present

## 2017-12-07 ENCOUNTER — Ambulatory Visit (INDEPENDENT_AMBULATORY_CARE_PROVIDER_SITE_OTHER): Payer: Self-pay

## 2017-12-07 DIAGNOSIS — Z950 Presence of cardiac pacemaker: Secondary | ICD-10-CM

## 2017-12-07 DIAGNOSIS — I5022 Chronic systolic (congestive) heart failure: Secondary | ICD-10-CM

## 2017-12-07 NOTE — Progress Notes (Signed)
EPIC Encounter for ICM Monitoring  Patient Name: [name] is a 77 y.o. male Date: 12/07/2017 Primary Care Physican: Glenda Chroman, MD Primary Cardiologist:Taylor Electrophysiologist: Lovena Le Dry Weight: 132.5lbs  Bi-V Pacing: 98.4%         Spoke with wife.  She said patient lost the 3 lbs and breathing improved since increasing Furosemide x 3 days (2/15-2/18).  He is back to taking prescribed 40 mg daily.  Each time I speak with wife she is confused on how much Furosemide he should be taking and gives different dosages.  She also has a recent Furosemide prescription from the PCP that says take 20 mg 4 times a day which is from a different pharmacy but did not know what the directions said on that bottle until we were talking.      Thoracic impedance remains abnormal suggesting fluid accumulation even after taking increased Furosemide dosage of 40 mg in AM and 20 in PM x 3 days.    Prescribed dosage: Furosemide 20 mg2tablets (40 mg total) twice a day.At this time, wife says patient is taking 2 tablets in the morning daily.    Labs:  10/18/2017 Creatinine 0.98, BUN 18, Potassium 4.2, Sodium 144, EGFR 75-87 09/15/2017 Creatinine 0.99, BUN 18, Potassium 4.1, Sodium 145, EGFR 74-86 06/12/2017 Creatinine 1.08, BUN 23, Potassium 4.1, Sodium 141, EGFR 67-77 02/22/2017 Creatinine 1.02, BUN 19, Potassium 4.0, Sodium 147, EGFR 72-83 10/25/2016 Creatinine 1.12, BUN 16, Potassium 4.4, Sodium 142, EGFR 63-73 10/09/2016 Creatinine 1.03, BUN 22, Potassium 4.2, Sodium 140, EGFR >60 (during hospitalization) 10/08/2016 Creatinine 1.13, BUN 25, Potassium 4.0, Sodium 140, EGFR >60 (during hospitalization) 10/05/2016 Creatinine 1.28, BUN 30, Potassium 3.8, Sodium 140, EGFR 53-60 (during hospitalization) 07/28/2016 Creatinine 0.98, BUN 12, Potassium 4.5, Sodium 141  05/16/2016 Creatinine 1.09, BUN 24, Potassium 4.1, Sodium 140 04/28/2016 Creatinine 1.79, BUN 33, Potassium 4.6, Sodium 140    12/08/2015 Creatinine 1.00, BUN 16, Potassium 4.2, Sodium 141  Recommendations:  Advised patient should be following the directions on 1 prescription of Furosemide.   Advised I would send copy of report to Dr Lovena Le and his nurse Sonia Baller regarding current [facility] report and patient Furosemide dosage.  Explained that it is difficult to know what Furosemide dosage is effective since he is not taking it the same way consistently.    Follow-up plan: ICM clinic phone appointment on 12/14/2017.    Copy of ICM check sent to Dr. Lovena Le.   3 month ICM trend: 12/05/2017    1 Year ICM trend:       Rosalene Billings, RN 12/07/2017 2:46 PM

## 2017-12-08 ENCOUNTER — Telehealth: Payer: Self-pay

## 2017-12-08 DIAGNOSIS — I5042 Chronic combined systolic (congestive) and diastolic (congestive) heart failure: Secondary | ICD-10-CM

## 2017-12-08 NOTE — Progress Notes (Signed)
Call to wife.  Advised to take the prescribed Furosemide 20 mg 2 tablets (40 mg total) every AM and 2 tablets (40 mg total) every PM.  Advised to call if patient has symptoms.  Will recheck fluid levels on 12/14/2017.  Advised Dr Lovena Le recommended he establish care with HF clinic and explained they specialize in patients condition.  Referral has been sent and she should get a call in the next week.

## 2017-12-08 NOTE — Telephone Encounter (Signed)
Ambulatory referral to CHF clinic made for [facility].  Will notifiy family.

## 2017-12-12 ENCOUNTER — Ambulatory Visit (INDEPENDENT_AMBULATORY_CARE_PROVIDER_SITE_OTHER): Payer: Medicare Other | Admitting: *Deleted

## 2017-12-12 DIAGNOSIS — G3184 Mild cognitive impairment, so stated: Secondary | ICD-10-CM | POA: Diagnosis not present

## 2017-12-12 DIAGNOSIS — R269 Unspecified abnormalities of gait and mobility: Secondary | ICD-10-CM | POA: Diagnosis not present

## 2017-12-12 DIAGNOSIS — J449 Chronic obstructive pulmonary disease, unspecified: Secondary | ICD-10-CM | POA: Diagnosis not present

## 2017-12-12 DIAGNOSIS — Z299 Encounter for prophylactic measures, unspecified: Secondary | ICD-10-CM | POA: Diagnosis not present

## 2017-12-12 DIAGNOSIS — I255 Ischemic cardiomyopathy: Secondary | ICD-10-CM

## 2017-12-12 DIAGNOSIS — J439 Emphysema, unspecified: Secondary | ICD-10-CM | POA: Diagnosis not present

## 2017-12-12 DIAGNOSIS — Z6821 Body mass index (BMI) 21.0-21.9, adult: Secondary | ICD-10-CM | POA: Diagnosis not present

## 2017-12-12 DIAGNOSIS — E039 Hypothyroidism, unspecified: Secondary | ICD-10-CM | POA: Diagnosis not present

## 2017-12-14 ENCOUNTER — Ambulatory Visit (INDEPENDENT_AMBULATORY_CARE_PROVIDER_SITE_OTHER): Payer: Self-pay

## 2017-12-14 ENCOUNTER — Telehealth: Payer: Self-pay | Admitting: Cardiology

## 2017-12-14 DIAGNOSIS — E538 Deficiency of other specified B group vitamins: Secondary | ICD-10-CM | POA: Diagnosis not present

## 2017-12-14 DIAGNOSIS — I5022 Chronic systolic (congestive) heart failure: Secondary | ICD-10-CM

## 2017-12-14 DIAGNOSIS — Z950 Presence of cardiac pacemaker: Secondary | ICD-10-CM

## 2017-12-14 NOTE — Telephone Encounter (Signed)
Confirmed remote transmission w/ pt wife.   

## 2017-12-14 NOTE — Progress Notes (Signed)
EPIC Encounter for ICM Monitoring  Patient Name: [name] is a 77 y.o. male Date: 12/14/2017 Primary Care Physican: Glenda Chroman, MD Primary Cardiologist:Taylor Electrophysiologist: Lovena Le Dry Weight: 132lbs (baseline)  Bi-V Pacing: 98.4%      Spoke with wife.  Heart Failure questions reviewed, pt asymptomatic.  Patient has been referred to HF clinic for evaluation.    Thoracic impedance returned to normal.  Prescribed dosage: Furosemide 20 mg2tablets (40 mg total) twice a day.  Labs:  10/18/2017 Creatinine 0.98, BUN 18, Potassium 4.2, Sodium 144, EGFR 75-87 09/15/2017 Creatinine 0.99, BUN 18, Potassium 4.1, Sodium 145, EGFR 74-86 06/12/2017 Creatinine 1.08, BUN 23, Potassium 4.1, Sodium 141, EGFR 67-77 02/22/2017 Creatinine 1.02, BUN 19, Potassium 4.0, Sodium 147, EGFR 72-83 10/25/2016 Creatinine 1.12, BUN 16, Potassium 4.4, Sodium 142, EGFR 63-73 10/09/2016 Creatinine 1.03, BUN 22, Potassium 4.2, Sodium 140, EGFR >60 (during hospitalization) 10/08/2016 Creatinine 1.13, BUN 25, Potassium 4.0, Sodium 140, EGFR >60 (during hospitalization) 10/05/2016 Creatinine 1.28, BUN 30, Potassium 3.8, Sodium 140, EGFR 53-60 (during hospitalization) 07/28/2016 Creatinine 0.98, BUN 12, Potassium 4.5, Sodium 141  05/16/2016 Creatinine 1.09, BUN 24, Potassium 4.1, Sodium 140 04/28/2016 Creatinine 1.79, BUN 33, Potassium 4.6, Sodium 140  12/08/2015 Creatinine 1.00, BUN 16, Potassium 4.2, Sodium 141  Recommendations: Advised to follow the Furosemide prescription instructions to take 40 mg twice a day.  Encouraged to call for fluid symptoms.  Follow-up plan: ICM clinic phone appointment on 01/16/2018.  Office appointment scheduled 02/19/2018 with Dr. Lovena Le.  Copy of ICM check sent to Dr. Lovena Le.   3 month ICM trend: 12/12/2017    1 Year ICM trend:       Rosalene Billings, RN 12/14/2017 3:21 PM

## 2017-12-15 ENCOUNTER — Encounter: Payer: Self-pay | Admitting: Cardiology

## 2017-12-15 NOTE — Progress Notes (Signed)
Remote pacemaker transmission.   

## 2017-12-19 DIAGNOSIS — E039 Hypothyroidism, unspecified: Secondary | ICD-10-CM | POA: Diagnosis not present

## 2017-12-19 DIAGNOSIS — I10 Essential (primary) hypertension: Secondary | ICD-10-CM | POA: Diagnosis not present

## 2017-12-19 DIAGNOSIS — M4802 Spinal stenosis, cervical region: Secondary | ICD-10-CM | POA: Diagnosis not present

## 2017-12-19 DIAGNOSIS — M542 Cervicalgia: Secondary | ICD-10-CM | POA: Diagnosis not present

## 2017-12-19 DIAGNOSIS — J9811 Atelectasis: Secondary | ICD-10-CM | POA: Diagnosis not present

## 2017-12-19 DIAGNOSIS — I5023 Acute on chronic systolic (congestive) heart failure: Secondary | ICD-10-CM | POA: Diagnosis not present

## 2017-12-19 DIAGNOSIS — I700 Atherosclerosis of aorta: Secondary | ICD-10-CM | POA: Diagnosis not present

## 2017-12-19 DIAGNOSIS — J449 Chronic obstructive pulmonary disease, unspecified: Secondary | ICD-10-CM | POA: Diagnosis not present

## 2017-12-19 DIAGNOSIS — J209 Acute bronchitis, unspecified: Secondary | ICD-10-CM | POA: Diagnosis not present

## 2017-12-19 DIAGNOSIS — E7800 Pure hypercholesterolemia, unspecified: Secondary | ICD-10-CM | POA: Diagnosis not present

## 2017-12-19 DIAGNOSIS — I517 Cardiomegaly: Secondary | ICD-10-CM | POA: Diagnosis not present

## 2017-12-19 DIAGNOSIS — M47812 Spondylosis without myelopathy or radiculopathy, cervical region: Secondary | ICD-10-CM | POA: Diagnosis not present

## 2017-12-19 DIAGNOSIS — J440 Chronic obstructive pulmonary disease with acute lower respiratory infection: Secondary | ICD-10-CM | POA: Diagnosis not present

## 2017-12-19 DIAGNOSIS — Z6821 Body mass index (BMI) 21.0-21.9, adult: Secondary | ICD-10-CM | POA: Diagnosis not present

## 2017-12-19 DIAGNOSIS — E538 Deficiency of other specified B group vitamins: Secondary | ICD-10-CM | POA: Diagnosis not present

## 2017-12-19 DIAGNOSIS — Z299 Encounter for prophylactic measures, unspecified: Secondary | ICD-10-CM | POA: Diagnosis not present

## 2017-12-19 DIAGNOSIS — R05 Cough: Secondary | ICD-10-CM | POA: Diagnosis not present

## 2017-12-21 DIAGNOSIS — E538 Deficiency of other specified B group vitamins: Secondary | ICD-10-CM | POA: Diagnosis not present

## 2017-12-25 DIAGNOSIS — I2510 Atherosclerotic heart disease of native coronary artery without angina pectoris: Secondary | ICD-10-CM | POA: Diagnosis not present

## 2017-12-25 DIAGNOSIS — E7800 Pure hypercholesterolemia, unspecified: Secondary | ICD-10-CM | POA: Diagnosis not present

## 2017-12-25 DIAGNOSIS — J449 Chronic obstructive pulmonary disease, unspecified: Secondary | ICD-10-CM | POA: Diagnosis not present

## 2017-12-25 DIAGNOSIS — M159 Polyosteoarthritis, unspecified: Secondary | ICD-10-CM | POA: Diagnosis not present

## 2017-12-28 ENCOUNTER — Telehealth: Payer: Self-pay | Admitting: Internal Medicine

## 2017-12-28 NOTE — Telephone Encounter (Signed)
Patient wife calling, states that she was told 12-07-17 that patient would be referred to [facility]. Mrs. Skalski would like an update on the referral.

## 2017-12-28 NOTE — Telephone Encounter (Signed)
Spoke with Jasmine from [facility] [place] [facility] and she will call patient to schedule OV.Marland Kitchen

## 2017-12-29 LAB — CUP PACEART REMOTE DEVICE CHECK
Brady Statistic AP VS Percent: 0.17 %
Brady Statistic AS VP Percent: 58.6 %
Brady Statistic RA Percent Paced: 38.55 %
Date Time Interrogation Session: 20190227025114
Implantable Lead Implant Date: 20050810
Implantable Lead Implant Date: 20050810
Implantable Lead Location: 753859
Implantable Lead Location: 753860
Lead Channel Impedance Value: 228 Ohm
Lead Channel Impedance Value: 494 Ohm
Lead Channel Impedance Value: 551 Ohm
Lead Channel Impedance Value: 608 Ohm
Lead Channel Impedance Value: 665 Ohm
Lead Channel Pacing Threshold Amplitude: 0.625 V
Lead Channel Pacing Threshold Amplitude: 1 V
Lead Channel Sensing Intrinsic Amplitude: 5.625 mV
Lead Channel Setting Pacing Amplitude: 1.5 V
Lead Channel Setting Pacing Amplitude: 3 V
Lead Channel Setting Pacing Pulse Width: 0.6 ms
Lead Channel Setting Sensing Sensitivity: 2.8 mV
MDC IDC LEAD IMPLANT DT: 20071015
MDC IDC LEAD LOCATION: 753858
MDC IDC MSMT BATTERY REMAINING LONGEVITY: 44 mo
MDC IDC MSMT BATTERY VOLTAGE: 2.99 V
MDC IDC MSMT LEADCHNL LV IMPEDANCE VALUE: 418 Ohm
MDC IDC MSMT LEADCHNL LV IMPEDANCE VALUE: 589 Ohm
MDC IDC MSMT LEADCHNL RA IMPEDANCE VALUE: 475 Ohm
MDC IDC MSMT LEADCHNL RA PACING THRESHOLD PULSEWIDTH: 0.4 ms
MDC IDC MSMT LEADCHNL RA SENSING INTR AMPL: 5.625 mV
MDC IDC MSMT LEADCHNL RV IMPEDANCE VALUE: 475 Ohm
MDC IDC MSMT LEADCHNL RV PACING THRESHOLD PULSEWIDTH: 0.4 ms
MDC IDC MSMT LEADCHNL RV SENSING INTR AMPL: 13.75 mV
MDC IDC MSMT LEADCHNL RV SENSING INTR AMPL: 13.75 mV
MDC IDC PG IMPLANT DT: 20170718
MDC IDC SET LEADCHNL LV PACING AMPLITUDE: 3.75 V
MDC IDC SET LEADCHNL LV PACING PULSEWIDTH: 1 ms
MDC IDC STAT BRADY AP VP PERCENT: 39.91 %
MDC IDC STAT BRADY AS VS PERCENT: 1.32 %
MDC IDC STAT BRADY RV PERCENT PACED: 93.72 %

## 2018-01-04 DIAGNOSIS — Z6821 Body mass index (BMI) 21.0-21.9, adult: Secondary | ICD-10-CM | POA: Diagnosis not present

## 2018-01-04 DIAGNOSIS — J449 Chronic obstructive pulmonary disease, unspecified: Secondary | ICD-10-CM | POA: Diagnosis not present

## 2018-01-04 DIAGNOSIS — J440 Chronic obstructive pulmonary disease with acute lower respiratory infection: Secondary | ICD-10-CM | POA: Diagnosis not present

## 2018-01-04 DIAGNOSIS — I10 Essential (primary) hypertension: Secondary | ICD-10-CM | POA: Diagnosis not present

## 2018-01-04 DIAGNOSIS — I255 Ischemic cardiomyopathy: Secondary | ICD-10-CM | POA: Diagnosis not present

## 2018-01-04 DIAGNOSIS — E7800 Pure hypercholesterolemia, unspecified: Secondary | ICD-10-CM | POA: Diagnosis not present

## 2018-01-04 DIAGNOSIS — J209 Acute bronchitis, unspecified: Secondary | ICD-10-CM | POA: Diagnosis not present

## 2018-01-04 DIAGNOSIS — Z299 Encounter for prophylactic measures, unspecified: Secondary | ICD-10-CM | POA: Diagnosis not present

## 2018-01-04 DIAGNOSIS — M109 Gout, unspecified: Secondary | ICD-10-CM | POA: Diagnosis not present

## 2018-01-04 DIAGNOSIS — M810 Age-related osteoporosis without current pathological fracture: Secondary | ICD-10-CM | POA: Diagnosis not present

## 2018-01-05 ENCOUNTER — Ambulatory Visit (INDEPENDENT_AMBULATORY_CARE_PROVIDER_SITE_OTHER): Payer: Self-pay

## 2018-01-05 DIAGNOSIS — Z950 Presence of cardiac pacemaker: Secondary | ICD-10-CM

## 2018-01-05 DIAGNOSIS — I5022 Chronic systolic (congestive) heart failure: Secondary | ICD-10-CM

## 2018-01-05 NOTE — Progress Notes (Signed)
EPIC Encounter for ICM Monitoring  Patient Name: [name] is a 77 y.o. male Date: 01/05/2018 Primary Care Physican: Glenda Chroman, MD Primary Cardiologist:Taylor Electrophysiologist: Druscilla Brownie Weight: 135lbs (baseline is 132 lbs)  Bi-V Pacing: no report for today       Received call from wife. She stated patient has had a 3 lb weight gain since 01/02/18, coughing and leg swelling.  She reported he does have some sinus congestion as well.     Wife attempted to send ICM remote transmission but the monitor is not working.  She has notified [facility] and expecting a new monitor next week.  Prescribed dosage: Furosemide 20 mg2tablets (40 mg total) twice a day.  Confirmed this is the dosage he has been taking.   Labs:  10/18/2017 Creatinine 0.98, BUN 18, Potassium 4.2, Sodium 144, EGFR 75-87 09/15/2017 Creatinine 0.99, BUN 18, Potassium 4.1, Sodium 145, EGFR 74-86 06/12/2017 Creatinine 1.08, BUN 23, Potassium 4.1, Sodium 141, EGFR 67-77 02/22/2017 Creatinine 1.02, BUN 19, Potassium 4.0, Sodium 147, EGFR 72-83 10/25/2016 Creatinine 1.12, BUN 16, Potassium 4.4, Sodium 142, EGFR 63-73 10/09/2016 Creatinine 1.03, BUN 22, Potassium 4.2, Sodium 140, EGFR >60 (during hospitalization) 10/08/2016 Creatinine 1.13, BUN 25, Potassium 4.0, Sodium 140, EGFR >60 (during hospitalization) 10/05/2016 Creatinine 1.28, BUN 30, Potassium 3.8, Sodium 140, EGFR 53-60 (during hospitalization) 07/28/2016 Creatinine 0.98, BUN 12, Potassium 4.5, Sodium 141  05/16/2016 Creatinine 1.09, BUN 24, Potassium 4.1, Sodium 140 04/28/2016 Creatinine 1.79, BUN 33, Potassium 4.6, Sodium 140  12/08/2015 Creatinine 1.00, BUN 16, Potassium 4.2, Sodium 141   Recommendations:  Advised to increase Furosemide 20 mg to 3 tablets (60 mg total) twice a day x 2 days and then return to 2 tablets twice a day.  She repeated instructions correctly.   Follow-up plan: ICM clinic phone appointment on 01/11/2018.  Office appointment  scheduled 02/13/2018 with Dr. Aundra Dubin (1st HF clinic visit).  Copy of ICM check sent to Dr. Lovena Le.   Unable to send report today    Rosalene Billings, RN 01/05/2018 10:45 AM

## 2018-01-10 ENCOUNTER — Encounter: Payer: Self-pay | Admitting: "Endocrinology

## 2018-01-10 ENCOUNTER — Ambulatory Visit (INDEPENDENT_AMBULATORY_CARE_PROVIDER_SITE_OTHER): Payer: Medicare Other | Admitting: "Endocrinology

## 2018-01-10 VITALS — BP 133/78 | HR 72 | Ht 69.0 in | Wt 138.0 lb

## 2018-01-10 DIAGNOSIS — E890 Postprocedural hypothyroidism: Secondary | ICD-10-CM | POA: Diagnosis not present

## 2018-01-10 DIAGNOSIS — I255 Ischemic cardiomyopathy: Secondary | ICD-10-CM | POA: Diagnosis not present

## 2018-01-10 DIAGNOSIS — Z85850 Personal history of malignant neoplasm of thyroid: Secondary | ICD-10-CM | POA: Diagnosis not present

## 2018-01-10 NOTE — Progress Notes (Signed)
Endocrinology Consult Note                                            01/10/2018, 2:48 PM   Subjective:    Patient ID: [name], male    DOB: 11-Oct-1941, PCP [name] Chroman, MD   Past Medical History:  Diagnosis Date  . AICD (automatic cardioverter/defibrillator) present   . Aortic insufficiency     moderate aortic ins moderate/asymptomatic/normal LV cavity size.   . Carotid artery disease ([name])     less than 50% stenosis bilaterally  . Carotid artery disease ([name])   . Chronic systolic CHF (congestive heart failure) ([place])   . Coronary artery disease   . Drug-induced gynecomastia     secondary to spironolactone  . Dyslipidemia   . Hypertension   . ICD (implantable cardiac defibrillator), biventricular, in situ     Medtronic model  [id number]  . Ischemic cardiomyopathy     status post non-ST elevation microinfarction stent to the right coronary artery is a   . LV dysfunction     NYHA class I/prior ejection fraction 20-25% and an ejection fraction 7 2009 50-55%, ejection fraction 60% bedside echocardiogram July 2012   . PONV (postoperative nausea and vomiting)   . Pulmonary embolism (H[facility])    Prior history of pulmonary embolism off Coumadin.  . Ventricular tachycardia ([place])    Inducible ventricular polymorphic tachycardia status post ICD followed by upgrade with CRT-D 2007, battery change at 01/31/2011   Past Surgical History:  Procedure Laterality Date  . ABLATION OF DYSRHYTHMIC FOCUS  10/20/2017   atrial tach  . ATRIAL TACH ABLATION N/A 10/20/2017   Procedure: ATRIAL TACH ABLATION;  Surgeon: [name] Lance, MD;  Location: [facility];  Service: Cardiovascular;  Laterality: N/A;  . CARDIAC CATHETERIZATION    . CARDIAC CATHETERIZATION N/A 12/10/2015   Procedure: Right/Left Heart Cath and Coronary Angiography;  Surgeon: [name] M Martinique, MD;  Location: [facility];  Service: Cardiovascular;  Laterality: N/A;  . CARDIAC DEFIBRILLATOR PLACEMENT     medtronic, remote - yes  . CATARACT EXTRACTION W/PHACO Left 08/25/2014   Procedure: CATARACT EXTRACTION PHACO AND INTRAOCULAR LENS PLACEMENT (IOC);  Surgeon: [name] Branch, MD;  Location: [facility];  Service: Ophthalmology;  Laterality: Left;  CDE 5.79  . CATARACT EXTRACTION W/PHACO Right 09/18/2014   Procedure: CATARACT EXTRACTION PHACO AND INTRAOCULAR LENS PLACEMENT RIGHT EYE;  Surgeon: [name] Branch, MD;  Location: [facility];  Service: Ophthalmology;  Laterality: Right;  CDE:3.35  . CORONARY STENT PLACEMENT    . EP IMPLANTABLE DEVICE N/A 05/03/2016   Procedure: BIV PPM Generator Changeout;  Surgeon: [name] Lance, MD;  Location: [facility];  Service: Cardiovascular;  Laterality: N/A;  . INGUINAL HERNIA REPAIR    . left breast biopsy    . RETROPUBIC PROSTATECTOMY    . THORACOTOMY     left  . THYROID SURGERY    . THYROIDECTOMY     Social History   Socioeconomic History  . Marital status: Married    Spouse name: Not on file  . Number of children: 2  . Years of education: Not on file  . Highest education level: Not on file  Occupational History  . Occupation: Development worker, community - self employed    Employer: RETIRED  Social Needs  . Financial resource strain:  Not on file  . Food insecurity:    Worry: Not on file    Inability: Not on file  . Transportation needs:    Medical: Not on file    Non-medical: Not on file  Tobacco Use  . Smoking status: Former Smoker    Packs/day: 1.00    Years: 43.00    Pack years: 43.00    Types: Cigarettes    Start date: 10/17/1957    Last attempt to quit: 12/13/2000    Years since quitting: 17.0  . Smokeless tobacco: Never Used  . Tobacco comment: started at age 59  Substance and Sexual Activity  . Alcohol use: No    Alcohol/week: 0.0 oz  . Drug use: No  . Sexual activity: Not Currently  Lifestyle  . Physical activity:    Days per week: Not on file    Minutes per session: Not on file  . Stress: Not on file  Relationships  . Social connections:    Talks  on phone: Not on file    Gets together: Not on file    Attends religious service: Not on file    Active member of club or organization: Not on file    Attends meetings of clubs or organizations: Not on file    Relationship status: Not on file  Other Topics Concern  . Not on file  Social History Narrative   Self employed Development worker, community; 2 daughters.    Outpatient Encounter Medications as of 01/10/2018  Medication Sig  . aspirin EC 81 MG tablet Take 81 mg by mouth at bedtime.  . Calcium Carb-Cholecalciferol (CALCIUM 500 + D3 PO) Take by mouth.  . carvedilol (COREG) 12.5 MG tablet TAKE 1 TABLET (12.5 MG TOTAL) BY MOUTH 2 (TWO) TIMES DAILY.  . fluticasone furoate-vilanterol (BREO ELLIPTA) 100-25 MCG/INH AEPB Inhale 1 puff into the lungs daily.  . furosemide (LASIX) 20 MG tablet Take 2 tablets (40 mg total) by mouth 2 (two) times daily.  Marland Kitchen levothyroxine (SYNTHROID, LEVOTHROID) 100 MCG tablet Take 100 mcg by mouth daily before breakfast.   . Multiple Vitamins-Minerals (MULTIVITAMIN WITH MINERALS) tablet Take 1 tablet by mouth daily.  . naproxen sodium (ALEVE) 220 MG tablet Take 220-440 mg by mouth 2 (two) times daily as needed (for pain.).  Marland Kitchen niacin 500 MG tablet Take 500 mg by mouth at bedtime.   . simvastatin (ZOCOR) 20 MG tablet Take 1 tablet (20 mg total) by mouth at bedtime.  . trandolapril (MAVIK) 2 MG tablet Take 1 tablet (2 mg total) by mouth daily.  . VENTOLIN HFA 108 (90 Base) MCG/ACT inhaler INHALE TWO PUFFS BY MOUTH EVERY 6 HOURS AS NEEDED FOR WHEEZING OR  SHORTNESS  OF  BREATH  . Polyethyl Glycol-Propyl Glycol (LUBRICANT EYE DROPS) 0.4-0.3 % SOLN Place 1-2 drops into both eyes 2 (two) times daily as needed (for dry/irritated eyes.).   No facility-administered encounter medications on file as of 01/10/2018.    ALLERGIES: Allergies  Allergen Reactions  . Spironolactone Other (See Comments)    gynecomastia    VACCINATION STATUS: Immunization History  Administered Date(s) Administered   . Influenza, High Dose Seasonal PF 08/31/2014, 10/01/2015, 08/29/2016  . Influenza-Unspecified 07/20/2017  . Pneumococcal Polysaccharide-23 10/17/2013    HPI [name] is 77 y.o. male who presents today with a medical history as above. he is being seen in consultation for postsurgical hypothyroidism related to his history of thyroid cancer in 2005 requested by [name] Chroman, MD.  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 -He did not have recent imaging studies for surveillance.  He remains compliant with his thyroid hormone replacement currently at 100 mcg p.o. every morning. -He is accompanied by his wife who helps him with history.  He complains of cold intolerance, recent weight gain, fatigue, and sluggishness.  -He denies any heat intolerance, palpitations, tremors. -He is a former smoker.  He denies family history of thyroid dysfunction nor thyroid cancer.   Review of Systems  Constitutional: + recent progressive weight gain, + fatigue,  + subjective hypothermia Eyes: no blurry vision, no xerophthalmia ENT: no sore throat, no nodules palpated in throat, no dysphagia/odynophagia, no hoarseness Cardiovascular: no chest pain, palpitations, shortness of breath.    Respiratory: no cough, no SOB Gastrointestinal: no Nausea/Vomiting/Diarhhea Musculoskeletal: no muscle/joint aches Skin: no rashes Neurological: No tremors, no tingling.    Psychiatric: no depression, no anxiety  Objective:    BP 133/78   Pulse 72   Ht 5\' 9"  (1.753 m)   Wt 138 lb (62.6 kg)   BMI 20.38 kg/m   Wt Readings from Last 3 Encounters:  01/10/18 138 lb (62.6 kg)  11/23/17 138 lb (62.6 kg)  10/21/17  135 lb 1.6 oz (61.3 kg)    Physical Exam  Constitutional:  + Appropriate weight for height, not in acute distress, normal state of mind Eyes: PERRLA, EOMI, no exophthalmos ENT: moist mucous membranes, + postsurgical scar over anterior lower neck , no cervical lymphadenopathy Cardiovascular: normal precordial activity, Regular Rate and Rhythm, no Murmur/Rubs/Gallops Respiratory:  adequate breathing efforts, no gross chest deformity, Clear to auscultation bilaterally Gastrointestinal: abdomen soft, Non -tender, No distension, Bowel Sounds present Musculoskeletal: no gross deformities, strength intact in all four extremities Skin: moist, warm, no rashes Neurological: no tremor with outstretched hands, Deep tendon reflexes normal in all four extremities.  CMP ( most recent) CMP     Component Value Date/Time   NA 144 10/18/2017 1300   K 4.2 10/18/2017 1300   CL 102 10/18/2017 1300   CO2 26 10/18/2017 1300   GLUCOSE 85 10/18/2017 1300   GLUCOSE 105 (H) 10/09/2016 0524   BUN 18 10/18/2017 1300   CREATININE 0.98 10/18/2017 1300   CREATININE 0.98 07/28/2016 1211   CALCIUM 9.3 10/18/2017 1300   PROT 7.1 10/08/2016 1348   ALBUMIN 4.3 10/08/2016 1348   AST 23 10/08/2016 1348   ALT 9 (L) 10/08/2016 1348   ALKPHOS 64 10/08/2016 1348   BILITOT 1.4 (H) 10/08/2016 1348   GFRNONAA 75 10/18/2017 1300   GFRAA 87 10/18/2017 1300    No recent thyroid function test to review   Assessment & Plan:   1. History of thyroid cancer -I have reviewed his history, Pt1Nx (bilateral, multicentric, follicular variant papillary thyroid cancer-2 cm in the left lobe and 0.6 cm in the right lobe diagnosed and treated in 2005).  It appears that he did have complete initial treatment with total thyroidectomy and thyroid remnant ablation.  He did have 4 subsequent whole-body scans which are negative for tumor recurrence and distant metastasis last one in 2007. -I offered him surveillance thyroid/neck ultrasound  before his next visit.  2. Postsurgical hypothyroidism -No recent thyroid function test to review.  He is currently on levothyroxine 100 mcg p.o. every morning. I would send him to lab today for new set of thyroid function tests including TSH, free T4/free T3, thyroglobulin levels and thyroglobulin antibodies.  -In the meantime:  We discussed about correct intake of levothyroxine, at fasting, with water, separated by at least 30 minutes from breakfast, and separated by more than 4 hours from calcium, iron, multivitamins, acid reflux medications (PPIs). -Patient is made aware of the fact that thyroid hormone replacement is needed for life, dose to be adjusted by periodic monitoring of thyroid function tests. - I did not initiate any new prescriptions today. - I advised patient to maintain close follow up with [name] Chroman, MD for primary care needs.  - Time spent with the patient: 45 minutes, of which >50% was spent in obtaining information about his symptoms, reviewing his previous labs, evaluations, and treatments, counseling him about his history of thyroid cancer, postsurgical hypothyroidism , and developing a plan for long term treatment as necessary.  [name] participated in the discussions, expressed understanding, and voiced agreement with the above plans.  All questions were answered to his satisfaction. he is encouraged to contact clinic should he have any questions or concerns prior to his return visit.  Follow up plan: Return in about 1 week (around 01/17/2018) for follow up with pre-visit labs, Thyroid / Neck Ultrasound, labs today.   Glade Lloyd, MD [facility] [address] [place], [place] [address] Phone: [phone]  Fax: [phone]     01/10/2018, 2:48 PM  This note was partially dictated with voice recognition software. Similar sounding words can be transcribed inadequately or may not  be corrected upon  review.

## 2018-01-11 ENCOUNTER — Encounter: Payer: Self-pay | Admitting: Internal Medicine

## 2018-01-11 ENCOUNTER — Ambulatory Visit (INDEPENDENT_AMBULATORY_CARE_PROVIDER_SITE_OTHER): Payer: Medicare Other

## 2018-01-11 DIAGNOSIS — I5022 Chronic systolic (congestive) heart failure: Secondary | ICD-10-CM | POA: Diagnosis not present

## 2018-01-11 DIAGNOSIS — Z950 Presence of cardiac pacemaker: Secondary | ICD-10-CM | POA: Diagnosis not present

## 2018-01-11 LAB — THYROGLOBULIN ANTIBODY: Thyroglobulin Ab: <1 IU/mL (ref <=1)

## 2018-01-11 LAB — THYROGLOBULIN LEVEL: Thyroglobulin: 0.1 ng/mL — ABNORMAL LOW

## 2018-01-11 LAB — T4, FREE: Free T4: 1.6 ng/dL (ref 0.8–1.8)

## 2018-01-11 LAB — T3, FREE: T3, Free: 1.9 pg/mL — ABNORMAL LOW (ref 2.3–4.2)

## 2018-01-11 LAB — TSH: TSH: 25.6 mIU/L — ABNORMAL HIGH (ref 0.40–4.50)

## 2018-01-11 NOTE — Progress Notes (Signed)
EPIC Encounter for ICM Monitoring  Patient Name: BRYCETON HANTZ is a 77 y.o. male Date: 01/11/2018 Primary Care Physican: Glenda Chroman, MD Primary Cardiologist:Taylor Electrophysiologist: Druscilla Brownie Weight: 134lbs (baseline is 132 lbs) Bi-V Pacing:   95.5%       Spoke with wife. Heart Failure questions reviewed, pt symptoms improved slightly after taking Furosemide 60 mg daily x 2 days but weight remains up by 2 lbs and still has some leg swelling.  Discussed low salt diet and she said her daughter gave her a list of low salt foods to eat.  Advised to read the food labels that he is eating and add the total amount for the day.  Recommended to limit salt to 2000 mg daily.   Thoracic impedance is worse after taking increased Furosemide x 2 days and continues to be abnormal suggesting fluid accumulation since 12/19/2017.  Prescribed dosage: Furosemide 20 mg2tablets (40 mg total) twice a day.    Labs:  10/18/2017 Creatinine 0.98, BUN 18, Potassium 4.2, Sodium 144, EGFR 75-87 09/15/2017 Creatinine 0.99, BUN 18, Potassium 4.1, Sodium 145, EGFR 74-86 06/12/2017 Creatinine 1.08, BUN 23, Potassium 4.1, Sodium 141, EGFR 67-77 02/22/2017 Creatinine 1.02, BUN 19, Potassium 4.0, Sodium 147, EGFR 72-83 10/25/2016 Creatinine 1.12, BUN 16, Potassium 4.4, Sodium 142, EGFR 63-73 10/09/2016 Creatinine 1.03, BUN 22, Potassium 4.2, Sodium 140, EGFR >60 (during hospitalization) 10/08/2016 Creatinine 1.13, BUN 25, Potassium 4.0, Sodium 140, EGFR >60 (during hospitalization) 10/05/2016 Creatinine 1.28, BUN 30, Potassium 3.8, Sodium 140, EGFR 53-60 (during hospitalization) 07/28/2016 Creatinine 0.98, BUN 12, Potassium 4.5, Sodium 141  05/16/2016 Creatinine 1.09, BUN 24, Potassium 4.1, Sodium 140 04/28/2016 Creatinine 1.79, BUN 33, Potassium 4.6, Sodium 140  12/08/2015 Creatinine 1.00, BUN 16, Potassium 4.2, Sodium 141  Recommendations: Advised will call back with any recommendations.  Follow-up plan:  ICM clinic phone appointment on 01/16/2018 to recheck fluid levels.  Office appointment scheduled 02/13/2018 with Dr. Aundra Dubin (1st HF clinic visit) and 02/19/2018 with Dr Lovena Le.  Copy of ICM check sent to Dr. Lovena Le.   3 month ICM trend: 01/09/2018    1 Year ICM trend:       Rosalene Billings, RN 01/11/2018 2:10 PM

## 2018-01-12 DIAGNOSIS — J342 Deviated nasal septum: Secondary | ICD-10-CM | POA: Diagnosis not present

## 2018-01-12 DIAGNOSIS — J324 Chronic pansinusitis: Secondary | ICD-10-CM | POA: Diagnosis not present

## 2018-01-12 DIAGNOSIS — J343 Hypertrophy of nasal turbinates: Secondary | ICD-10-CM | POA: Diagnosis not present

## 2018-01-12 DIAGNOSIS — R05 Cough: Secondary | ICD-10-CM | POA: Diagnosis not present

## 2018-01-12 NOTE — Progress Notes (Signed)
Attempted call to patients wife to provide new orders from Dr Lovena Le and left message to return call before 5 PM today.

## 2018-01-12 NOTE — Progress Notes (Signed)
Attempted call to wife and left message to return call before 5 PM today

## 2018-01-12 NOTE — Progress Notes (Signed)
Received recommendations from Dr Lovena Le via his nurse, Myrtie Hawk, RN.  Dr Lovena Le ordered to increase Furosemide to 80 mg bid. Recheck BMP in 1-2 weeks

## 2018-01-15 ENCOUNTER — Ambulatory Visit (HOSPITAL_COMMUNITY)
Admission: RE | Admit: 2018-01-15 | Discharge: 2018-01-15 | Disposition: A | Discharge status: Home / Self Care | Payer: Medicare Other | Source: Ambulatory Visit | Attending: "Endocrinology | Admitting: "Endocrinology

## 2018-01-15 DIAGNOSIS — Z85850 Personal history of malignant neoplasm of thyroid: Secondary | ICD-10-CM | POA: Diagnosis not present

## 2018-01-15 DIAGNOSIS — Z9889 Other specified postprocedural states: Secondary | ICD-10-CM | POA: Insufficient documentation

## 2018-01-15 DIAGNOSIS — E041 Nontoxic single thyroid nodule: Secondary | ICD-10-CM | POA: Diagnosis not present

## 2018-01-15 MED ORDER — FUROSEMIDE 20 MG PO TABS: ORAL_TABLET | ORAL | 3 refills | Status: DC

## 2018-01-15 NOTE — Progress Notes (Signed)
Wife returned call.  Advised Dr Lovena Le ordered to change Furosemide to 80 mg twice a day and have labs drawn in 1-2 weeks.  She verbalized understanding and advised will send new script and she stated they have enough on hand for the increased dosage.  Advised she will need to call pharmacy to fill new script.  BMET to be drawn at [facility] on 4/10.  Recheck fluid levels on 01/19/2018

## 2018-01-17 ENCOUNTER — Ambulatory Visit: Payer: Medicare Other | Admitting: Diagnostic Neuroimaging

## 2018-01-17 ENCOUNTER — Other Ambulatory Visit (HOSPITAL_COMMUNITY)
Admission: RE | Admit: 2018-01-17 | Discharge: 2018-01-17 | Disposition: A | Discharge status: Home / Self Care | Payer: Medicare Other | Source: Ambulatory Visit | Attending: Internal Medicine | Admitting: Internal Medicine

## 2018-01-17 DIAGNOSIS — I5022 Chronic systolic (congestive) heart failure: Secondary | ICD-10-CM

## 2018-01-17 LAB — BASIC METABOLIC PANEL
Anion gap: 10 (ref 5–15)
BUN: 18 mg/dL (ref 6–20)
CHLORIDE: 98 mmol/L — AB (ref 101–111)
CO2: 33 mmol/L — AB (ref 22–32)
CREATININE: 1.12 mg/dL (ref 0.61–1.24)
Calcium: 9 mg/dL (ref 8.9–10.3)
GFR calc non Af Amer: >60 mL/min (ref >60)
Glucose, Bld: 72 mg/dL (ref 65–99)
POTASSIUM: 3.3 mmol/L — AB (ref 3.5–5.1)
SODIUM: 141 mmol/L (ref 135–145)

## 2018-01-18 ENCOUNTER — Other Ambulatory Visit: Payer: Self-pay | Admitting: "Endocrinology

## 2018-01-18 ENCOUNTER — Ambulatory Visit (INDEPENDENT_AMBULATORY_CARE_PROVIDER_SITE_OTHER): Payer: Medicare Other | Admitting: "Endocrinology

## 2018-01-18 ENCOUNTER — Encounter: Payer: Self-pay | Admitting: "Endocrinology

## 2018-01-18 VITALS — BP 111/65 | HR 70 | Ht 69.0 in | Wt 133.0 lb

## 2018-01-18 DIAGNOSIS — E890 Postprocedural hypothyroidism: Secondary | ICD-10-CM | POA: Diagnosis not present

## 2018-01-18 DIAGNOSIS — Z85850 Personal history of malignant neoplasm of thyroid: Secondary | ICD-10-CM

## 2018-01-18 DIAGNOSIS — I255 Ischemic cardiomyopathy: Secondary | ICD-10-CM

## 2018-01-18 MED ORDER — LEVOTHYROXINE SODIUM 125 MCG PO TABS: 125.0000 ug | ORAL_TABLET | Freq: Every day | ORAL | 3 refills | Status: DC

## 2018-01-18 NOTE — Progress Notes (Signed)
Endocrinology follow-up note                                            01/18/2018, 2:37 PM   Subjective:    Patient ID: [name], male    DOB: July 20, 1941, PCP Glenda Chroman, MD   Past Medical History:  Diagnosis Date  . AICD (automatic cardioverter/defibrillator) present   . Aortic insufficiency     moderate aortic ins moderate/asymptomatic/normal LV cavity size.   . Carotid artery disease ([place])     less than 50% stenosis bilaterally  . Carotid artery disease ([place])   . Chronic systolic CHF (congestive heart failure) ([place])   . Coronary artery disease   . Drug-induced gynecomastia     secondary to spironolactone  . Dyslipidemia   . Hypertension   . ICD (implantable cardiac defibrillator), biventricular, in situ     Medtronic model  [id number]  . Ischemic cardiomyopathy     status post non-ST elevation microinfarction stent to the right coronary artery is a   . LV dysfunction     NYHA class I/prior ejection fraction 20-25% and an ejection fraction 7 2009 50-55%, ejection fraction 60% bedside echocardiogram July 2012   . PONV (postoperative nausea and vomiting)   . Pulmonary embolism ([facility])    Prior history of pulmonary embolism off Coumadin.  . Ventricular tachycardia ([place])    Inducible ventricular polymorphic tachycardia status post ICD followed by upgrade with CRT-D 2007, battery change at 01/31/2011   Past Surgical History:  Procedure Laterality Date  . ABLATION OF DYSRHYTHMIC FOCUS  10/20/2017   atrial tach  . ATRIAL TACH ABLATION N/A 10/20/2017   Procedure: ATRIAL TACH ABLATION;  Surgeon: Evans Lance, MD;  Location: [facility];  Service: Cardiovascular;  Laterality: N/A;  . CARDIAC CATHETERIZATION    . CARDIAC CATHETERIZATION N/A 12/10/2015   Procedure: Right/Left Heart Cath and Coronary Angiography;  Surgeon: Peter M Martinique, MD;  Location: [facility];  Service: Cardiovascular;  Laterality: N/A;  . CARDIAC DEFIBRILLATOR PLACEMENT      medtronic, remote - yes  . CATARACT EXTRACTION W/PHACO Left 08/25/2014   Procedure: CATARACT EXTRACTION PHACO AND INTRAOCULAR LENS PLACEMENT (IOC);  Surgeon: Tonny Branch, MD;  Location: [facility];  Service: Ophthalmology;  Laterality: Left;  CDE 5.79  . CATARACT EXTRACTION W/PHACO Right 09/18/2014   Procedure: CATARACT EXTRACTION PHACO AND INTRAOCULAR LENS PLACEMENT RIGHT EYE;  Surgeon: Tonny Branch, MD;  Location: [facility];  Service: Ophthalmology;  Laterality: Right;  CDE:3.35  . CORONARY STENT PLACEMENT    . EP IMPLANTABLE DEVICE N/A 05/03/2016   Procedure: BIV PPM Generator Changeout;  Surgeon: Evans Lance, MD;  Location: [facility];  Service: Cardiovascular;  Laterality: N/A;  . INGUINAL HERNIA REPAIR    . left breast biopsy    . RETROPUBIC PROSTATECTOMY    . THORACOTOMY     left  . THYROID SURGERY    . THYROIDECTOMY     Social History   Socioeconomic History  . Marital status: Married    Spouse name: Not on file  . Number of children: 2  . Years of education: Not on file  . Highest education level: Not on file  Occupational History  . Occupation: Development worker, community - self employed    Employer: RETIRED  Social Needs  . Emergency planning/management officer  strain: Not on file  . Food insecurity:    Worry: Not on file    Inability: Not on file  . Transportation needs:    Medical: Not on file    Non-medical: Not on file  Tobacco Use  . Smoking status: Former Smoker    Packs/day: 1.00    Years: 43.00    Pack years: 43.00    Types: Cigarettes    Start date: 10/17/1957    Last attempt to quit: 12/13/2000    Years since quitting: 17.1  . Smokeless tobacco: Never Used  . Tobacco comment: started at age 55  Substance and Sexual Activity  . Alcohol use: No    Alcohol/week: 0.0 oz  . Drug use: No  . Sexual activity: Not Currently  Lifestyle  . Physical activity:    Days per week: Not on file    Minutes per session: Not on file  . Stress: Not on file  Relationships  . Social connections:    Talks  on phone: Not on file    Gets together: Not on file    Attends religious service: Not on file    Active member of club or organization: Not on file    Attends meetings of clubs or organizations: Not on file    Relationship status: Not on file  Other Topics Concern  . Not on file  Social History Narrative   Self employed Development worker, community; 2 daughters.    Outpatient Encounter Medications as of 01/18/2018  Medication Sig  . aspirin EC 81 MG tablet Take 81 mg by mouth at bedtime.  . Calcium Carb-Cholecalciferol (CALCIUM 500 + D3 PO) Take by mouth.  . carvedilol (COREG) 12.5 MG tablet TAKE 1 TABLET (12.5 MG TOTAL) BY MOUTH 2 (TWO) TIMES DAILY.  . fluticasone furoate-vilanterol (BREO ELLIPTA) 100-25 MCG/INH AEPB Inhale 1 puff into the lungs daily.  . furosemide (LASIX) 20 MG tablet Take 4 tablets (80 mg total) by mouth twice a day.  . levothyroxine (SYNTHROID, LEVOTHROID) 125 MCG tablet Take 1 tablet (125 mcg total) by mouth daily before breakfast.  . Multiple Vitamins-Minerals (MULTIVITAMIN WITH MINERALS) tablet Take 1 tablet by mouth daily.  . naproxen sodium (ALEVE) 220 MG tablet Take 220-440 mg by mouth 2 (two) times daily as needed (for pain.).  Marland Kitchen niacin 500 MG tablet Take 500 mg by mouth at bedtime.   Vladimir Faster Glycol-Propyl Glycol (LUBRICANT EYE DROPS) 0.4-0.3 % SOLN Place 1-2 drops into both eyes 2 (two) times daily as needed (for dry/irritated eyes.).  Marland Kitchen simvastatin (ZOCOR) 20 MG tablet Take 1 tablet (20 mg total) by mouth at bedtime.  . trandolapril (MAVIK) 2 MG tablet Take 1 tablet (2 mg total) by mouth daily.  . VENTOLIN HFA 108 (90 Base) MCG/ACT inhaler INHALE TWO PUFFS BY MOUTH EVERY 6 HOURS AS NEEDED FOR WHEEZING OR  SHORTNESS  OF  BREATH  . [DISCONTINUED] levothyroxine (SYNTHROID, LEVOTHROID) 100 MCG tablet Take 100 mcg by mouth daily before breakfast.    No facility-administered encounter medications on file as of 01/18/2018.    ALLERGIES: Allergies  Allergen Reactions  .  Spironolactone Other (See Comments)    gynecomastia    VACCINATION STATUS: Immunization History  Administered Date(s) Administered  . Influenza, High Dose Seasonal PF 08/31/2014, 10/01/2015, 08/29/2016  . Influenza-Unspecified 07/20/2017  . Pneumococcal Polysaccharide-23 10/17/2013    HPI [name] is 77 y.o. male who presents today with a medical history as above. he is returning with new thyroid function tests and  surveillance thyroid/neck ultrasound.  He was seen in consultation for postsurgical hypothyroidism related to his history of thyroid cancer in 2005 requested by Glenda Chroman, MD.  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 -His surveillance ultrasound is significant for surgically absent thyroid and negative for any thyroid mass or tumor recurrence.  -He is currently on levothyroxine 100 mcg p.o. every morning, reports compliance. -He is accompanied by his wife who helps him with history.  He complains of cold intolerance, recent weight gain, fatigue, and sluggishness.  -He denies any heat intolerance, palpitations, tremors. -He is a former smoker.  He denies family history of thyroid dysfunction nor thyroid cancer.   Review of Systems  Constitutional: + recent progressive weight gain, + fatigue,  + subjective hypothermia Eyes: no blurry vision, no xerophthalmia ENT: no sore throat, no nodules palpated in throat, no dysphagia/odynophagia, no hoarseness Cardiovascular: no chest pain, palpitations, shortness of breath.    Respiratory: no cough, no SOB Gastrointestinal: no Nausea/Vomiting/Diarhhea Musculoskeletal: no muscle/joint aches Skin: no rashes Neurological:  No tremors, no tingling, complains of disequilibrium.  Psychiatric: no depression, no anxiety  Objective:    BP 111/65   Pulse 70   Ht 5\' 9"  (1.753 m)   Wt 133 lb (60.3 kg)   BMI 19.64 kg/m   Wt Readings from Last 3 Encounters:  01/18/18 133 lb (60.3 kg)  01/10/18 138 lb (62.6 kg)  11/23/17 138 lb (62.6 kg)    Physical Exam  Constitutional:  + Appropriate weight for height, not in acute distress, normal state of mind, alert and oriented x3. Eyes: PERRLA, EOMI, no exophthalmos ENT: moist mucous membranes, + postsurgical scar over anterior lower neck , no cervical lymphadenopathy  Musculoskeletal: no gross deformities, strength intact in all four extremities Skin: moist, warm, no rashes Neurological: no tremor with outstretched hands, Deep tendon reflexes normal in all four extremities, no evidence of ataxia.  CMP ( most recent) CMP     Component Value Date/Time   NA 141 01/17/2018 1109   NA 144 10/18/2017 1300   K 3.3 (L) 01/17/2018 1109   CL 98 (L) 01/17/2018 1109   CO2 33 (H) 01/17/2018 1109   GLUCOSE 72 01/17/2018 1109   BUN 18 01/17/2018 1109   BUN 18 10/18/2017 1300   CREATININE 1.12 01/17/2018 1109   CREATININE 0.98 07/28/2016 1211   CALCIUM 9.0 01/17/2018 1109   PROT 7.1 10/08/2016 1348   ALBUMIN 4.3 10/08/2016 1348   AST 23 10/08/2016 1348   ALT 9 (L) 10/08/2016 1348   ALKPHOS 64 10/08/2016 1348   BILITOT 1.4 (H) 10/08/2016 1348   GFRNONAA >60 01/17/2018 1109   GFRAA >60 01/17/2018 1109   Results for SALEH, ULBRICH (MRN [id number]) as of 01/18/2018 14:27  Ref. Range 01/10/2018 09:08  TSH Latest Ref Range: 0.40 - 4.50 mIU/L 25.60 (H)  Triiodothyronine,Free,Serum Latest Ref Range: 2.3 - 4.2 pg/mL 1.9 (L)  T4,Free(Direct) Latest Ref Range: 0.8 - 1.8 ng/dL 1.6  Thyroglobulin Latest Units: ng/mL 0.1 (L)  Thyroglobulin Ab Latest Ref Range: < or = 1 IU/mL <1   Thyroid ultrasound is significant for surgically absent thyroid.    Assessment & Plan:   1.  History of  thyroid cancer -I have reviewed his history, Pt1Nx (bilateral, multicentric, follicular variant papillary thyroid cancer-2 cm in the left lobe and 0.6 cm in the right lobe diagnosed and treated in 2005).  It appears that he did have complete initial treatment with total thyroidectomy and thyroid remnant ablation.  He did have 4 subsequent whole-body scans which are negative for tumor recurrence and distant metastasis last one in 2007. -His surveillance thyroid/neck ultrasound is significant for surgically absent thyroid with no evidence of thyroid mass or tumor recurrence.  No intervention required at this time.  2. Postsurgical hypothyroidism -His recent thyroid function tests are consistent with significantly inadequate hormone replacement.   -I discussed and increase his levothyroxine to 125 mcg p.o. every morning.    - We discussed about correct intake of levothyroxine, at fasting, with water, separated by at least 30 minutes from breakfast, and separated by more than 4 hours from calcium, iron, multivitamins, acid reflux medications (PPIs). -Patient is made aware of the fact that thyroid hormone replacement is needed for life, dose to be adjusted by periodic monitoring of thyroid function tests. Fortunately he underwent CT scan of his head/brain with no pathology.  I advised him to follow-up with his PMD for his disequilibrium.  [name] participated in the discussions, expressed understanding, and voiced agreement with the above plans.  All questions were answered to his satisfaction. he is encouraged to contact clinic should he have any questions or concerns prior to his return visit.   Follow up plan: Return in about 3 months (around 04/19/2018) for follow up with pre-visit labs.   Glade Lloyd, MD [facility] [address] [place], [place] [address] Phone: [phone]  Fax: [phone]     01/18/2018, 2:37  PM  This note was partially dictated with voice recognition software. Similar sounding words can be transcribed inadequately or may not  be corrected upon review.

## 2018-01-19 ENCOUNTER — Ambulatory Visit (INDEPENDENT_AMBULATORY_CARE_PROVIDER_SITE_OTHER): Payer: Self-pay

## 2018-01-19 ENCOUNTER — Telehealth: Payer: Self-pay

## 2018-01-19 DIAGNOSIS — I5022 Chronic systolic (congestive) heart failure: Secondary | ICD-10-CM

## 2018-01-19 DIAGNOSIS — Z950 Presence of cardiac pacemaker: Secondary | ICD-10-CM

## 2018-01-19 NOTE — Telephone Encounter (Signed)
Remote ICM transmission received.  Attempted call to patient and left message per DPR to return call.    

## 2018-01-19 NOTE — Progress Notes (Signed)
EPIC Encounter for ICM Monitoring  Patient Name: [name] is a 77 y.o. male Date: 01/19/2018 Primary Care Physican: Glenda Chroman, MD Primary Cardiologist:Taylor Electrophysiologist: Lovena Le Dry Weight: Previous weight 135lbs (baseline is 132 lbs) Bi-V Pacing: 96.4 %      Attempted call to wife and unable to reach.  Left message to return call regarding transmission.  Transmission reviewed.  BMET scheduled for 01/24/2018   Thoracic impedance improved and close to baseline normal after Furosemide increase to 80 mg twice a day.  Prescribed dosage: Furosemide 20 mg 4 tablets (80 mg total) twice a day.   Labs: 01/17/2018 Creatinine 1.12, BUN 18, Potassium 3.3, Sodium 141, EGFR >60  10/31/2017 Creatinine 1.19, BUN 26, Potassium 4.0, Sodium 144, EGFR 59-69  10/18/2017 Creatinine 0.98, BUN 18, Potassium 4.2, Sodium 144, EGFR 75-87 09/15/2017 Creatinine 0.99, BUN 18, Potassium 4.1, Sodium 145, EGFR 74-86 06/12/2017 Creatinine 1.08, BUN 23, Potassium 4.1, Sodium 141, EGFR 67-77 02/22/2017 Creatinine 1.02, BUN 19, Potassium 4.0, Sodium 147, EGFR 72-83 10/25/2016 Creatinine 1.12, BUN 16, Potassium 4.4, Sodium 142, EGFR 63-73       A complete set of results can be found in Results Review.  Recommendations:  NONE - Unable to reach.  Follow-up plan: ICM clinic phone appointment on 02/01/2018 to recheck fluid levels.  Office appointment scheduled 02/13/2018 with Dr. Aundra Dubin (1st visit) and Dr Lovena Le 02/20/2018.  Copy of ICM check sent to Dr. Lovena Le.   3 month ICM trend: 01/19/2018    1 Year ICM trend:       Rosalene Billings, RN 01/19/2018 9:27 AM

## 2018-01-19 NOTE — Progress Notes (Signed)
Wife returned call. Patients weight dropped several pounds and today's weight is 129 lbs.  She said he is feeling good and leg swelling is down.  She got confused on the dates for labs to be drawn and completed it 4/3 instead of 4/10.  She asked if it will need to be drawn again next week and advised I would check with Dr Lovena Le if more labs need to be drawn.    Advised would call back if there are new orders or changes.  Next ICM remote transmission to recheck fluid levels is 02/01/2018.

## 2018-01-23 ENCOUNTER — Telehealth: Payer: Self-pay

## 2018-01-23 DIAGNOSIS — I5022 Chronic systolic (congestive) heart failure: Secondary | ICD-10-CM

## 2018-01-23 MED ORDER — POTASSIUM CHLORIDE CRYS ER 20 MEQ PO TBCR: 20.0000 meq | EXTENDED_RELEASE_TABLET | Freq: Two times a day (BID) | ORAL | 3 refills | Status: DC

## 2018-01-23 NOTE — Telephone Encounter (Addendum)
Attempted call to wife regarding new orders from Dr Lovena Le.  Left message to return call.   Dr Lovena Le ordered Kdur 20 meq one tablet by mouth BID and recheck his BMP in 2 weeks.    Received: Today  Message Contents  Damian Leavell, RN  Nyko Gell Panda, RN        Dr. Lovena Le would like to start him on Kdur 20 meq one tablet by mouth BID and recheck his BMP in 2 weeks.  Sonia Baller

## 2018-01-23 NOTE — Telephone Encounter (Signed)
Received call back from wife.  Advised Dr Lovena Le wants to start patient on Kdur 20 mEq 1 tablet twice a day.  Confirmed [facility] is preferred pharmacy.  Will have BMET drawn in 2 weeks on 02/06/2018 at [place] [facility].   She said patient is feeling weak today.  BP is 107/56 and 98/53 today.  Weight has dropped to 127.5 lbs.  Advised to call if BP decreases or if weakness does not improve or worsens.

## 2018-01-29 DIAGNOSIS — Z6820 Body mass index (BMI) 20.0-20.9, adult: Secondary | ICD-10-CM | POA: Diagnosis not present

## 2018-01-29 DIAGNOSIS — I10 Essential (primary) hypertension: Secondary | ICD-10-CM | POA: Diagnosis not present

## 2018-01-29 DIAGNOSIS — J209 Acute bronchitis, unspecified: Secondary | ICD-10-CM | POA: Diagnosis not present

## 2018-01-29 DIAGNOSIS — J440 Chronic obstructive pulmonary disease with acute lower respiratory infection: Secondary | ICD-10-CM | POA: Diagnosis not present

## 2018-01-29 DIAGNOSIS — Z299 Encounter for prophylactic measures, unspecified: Secondary | ICD-10-CM | POA: Diagnosis not present

## 2018-01-29 DIAGNOSIS — Z87891 Personal history of nicotine dependence: Secondary | ICD-10-CM | POA: Diagnosis not present

## 2018-01-29 DIAGNOSIS — J441 Chronic obstructive pulmonary disease with (acute) exacerbation: Secondary | ICD-10-CM | POA: Diagnosis not present

## 2018-02-01 ENCOUNTER — Telehealth: Payer: Self-pay

## 2018-02-01 ENCOUNTER — Ambulatory Visit (INDEPENDENT_AMBULATORY_CARE_PROVIDER_SITE_OTHER): Payer: Self-pay

## 2018-02-01 DIAGNOSIS — I5022 Chronic systolic (congestive) heart failure: Secondary | ICD-10-CM

## 2018-02-01 DIAGNOSIS — Z950 Presence of cardiac pacemaker: Secondary | ICD-10-CM

## 2018-02-01 NOTE — Telephone Encounter (Signed)
Remote ICM transmission received.  Attempted call to wife and no answer or answering machine.

## 2018-02-01 NOTE — Progress Notes (Signed)
EPIC Encounter for ICM Monitoring  Patient Name: [name] is a 77 y.o. male Date: 02/01/2018 Primary Care Physican: Glenda Chroman, MD Primary Cardiologist:Taylor Electrophysiologist: Lovena Le Dry Weight: Previous weight 135lbs (baseline is 132 lbs) Bi-V Pacing: 96.4 %        Attempted call to patient and unable to reach.   Transmission reviewed.  Due to have BMET drawn on 02/06/2018 at [place] [facility]   Thoracic impedance returned to normal after increase in long term Furosemide on 01/11/2018.  Prescribed dosage: Furosemide 20 mg 4 tablets (80 mg total) twice a day.   Labs: 01/17/2018 Creatinine 1.12, BUN 18, Potassium 3.3, Sodium 141, EGFR >60  10/31/2017 Creatinine 1.19, BUN 26, Potassium 4.0, Sodium 144, EGFR 59-69  10/18/2017 Creatinine 0.98, BUN 18, Potassium 4.2, Sodium 144, EGFR 75-87 09/15/2017 Creatinine 0.99, BUN 18, Potassium 4.1, Sodium 145, EGFR 74-86 06/12/2017 Creatinine 1.08, BUN 23, Potassium 4.1, Sodium 141, EGFR 67-77 02/22/2017 Creatinine 1.02, BUN 19, Potassium 4.0, Sodium 147, EGFR 72-83 10/25/2016 Creatinine 1.12, BUN 16, Potassium 4.4, Sodium 142, EGFR 63-73       A complete set of results can be found in Results Review.  Recommendations: NONE - Unable to reach.  Follow-up plan: ICM clinic phone appointment on 02/22/2018. Office appointment scheduled 02/13/2018 with Dr. Aundra Dubin (1st visit) and Dr Lovena Le 02/20/2018.  Copy of ICM check sent to Dr. Lovena Le.   3 month ICM trend: 02/01/2018    1 Year ICM trend:       Rosalene Billings, RN 02/01/2018 4:18 PM

## 2018-02-02 NOTE — Progress Notes (Signed)
Call to wife.  Reviewed transmission.  Current weight is 126 lbs and no leg swelling.  She said he has some allergies now but everything else is fine.  No changes today.

## 2018-02-06 ENCOUNTER — Other Ambulatory Visit (HOSPITAL_COMMUNITY)
Admission: RE | Admit: 2018-02-06 | Discharge: 2018-02-06 | Disposition: A | Discharge status: Home / Self Care | Payer: Medicare Other | Source: Ambulatory Visit | Attending: Internal Medicine | Admitting: Internal Medicine

## 2018-02-06 DIAGNOSIS — I5022 Chronic systolic (congestive) heart failure: Secondary | ICD-10-CM | POA: Insufficient documentation

## 2018-02-06 LAB — BASIC METABOLIC PANEL
ANION GAP: 12 (ref 5–15)
BUN: 25 mg/dL — ABNORMAL HIGH (ref 6–20)
CALCIUM: 8.8 mg/dL — AB (ref 8.9–10.3)
CO2: 30 mmol/L (ref 22–32)
Chloride: 97 mmol/L — ABNORMAL LOW (ref 101–111)
Creatinine, Ser: 1.21 mg/dL (ref 0.61–1.24)
GFR calc Af Amer: >60 mL/min (ref >60)
GFR, EST NON AFRICAN AMERICAN: 57 mL/min — AB (ref >60)
Glucose, Bld: 148 mg/dL — ABNORMAL HIGH (ref 65–99)
Potassium: 3.7 mmol/L (ref 3.5–5.1)
Sodium: 139 mmol/L (ref 135–145)

## 2018-02-08 DIAGNOSIS — E538 Deficiency of other specified B group vitamins: Secondary | ICD-10-CM | POA: Diagnosis not present

## 2018-02-09 ENCOUNTER — Telehealth: Payer: Self-pay

## 2018-02-09 NOTE — Telephone Encounter (Signed)
Received call from wife, (DPR).  She asked for 02/06/2018 blood work results and results reviewed.  She asked about several medications and if he will need to continue them. Advised her to write down questions and ask at the appointment with Dr Aundra Dubin on 02/13/2018 (1st HF clinic appointment).  She said patient currently has sinus infection.  No changes today.

## 2018-02-12 DIAGNOSIS — J329 Chronic sinusitis, unspecified: Secondary | ICD-10-CM | POA: Diagnosis not present

## 2018-02-12 DIAGNOSIS — J343 Hypertrophy of nasal turbinates: Secondary | ICD-10-CM | POA: Diagnosis not present

## 2018-02-12 DIAGNOSIS — R05 Cough: Secondary | ICD-10-CM | POA: Diagnosis not present

## 2018-02-12 DIAGNOSIS — J342 Deviated nasal septum: Secondary | ICD-10-CM | POA: Diagnosis not present

## 2018-02-12 DIAGNOSIS — J324 Chronic pansinusitis: Secondary | ICD-10-CM | POA: Diagnosis not present

## 2018-02-13 ENCOUNTER — Other Ambulatory Visit: Payer: Self-pay

## 2018-02-13 ENCOUNTER — Ambulatory Visit (HOSPITAL_COMMUNITY)
Admission: RE | Admit: 2018-02-13 | Discharge: 2018-02-13 | Disposition: A | Discharge status: Home / Self Care | Payer: Medicare Other | Source: Ambulatory Visit | Attending: Cardiology | Admitting: Cardiology

## 2018-02-13 ENCOUNTER — Encounter (HOSPITAL_COMMUNITY): Payer: Self-pay | Admitting: Cardiology

## 2018-02-13 ENCOUNTER — Encounter: Payer: Self-pay | Admitting: Internal Medicine

## 2018-02-13 VITALS — BP 115/65 | HR 70 | Wt 126.0 lb

## 2018-02-13 DIAGNOSIS — Z9889 Other specified postprocedural states: Secondary | ICD-10-CM | POA: Insufficient documentation

## 2018-02-13 DIAGNOSIS — Z87891 Personal history of nicotine dependence: Secondary | ICD-10-CM | POA: Diagnosis not present

## 2018-02-13 DIAGNOSIS — E538 Deficiency of other specified B group vitamins: Secondary | ICD-10-CM | POA: Diagnosis not present

## 2018-02-13 DIAGNOSIS — Z79899 Other long term (current) drug therapy: Secondary | ICD-10-CM | POA: Insufficient documentation

## 2018-02-13 DIAGNOSIS — I2510 Atherosclerotic heart disease of native coronary artery without angina pectoris: Secondary | ICD-10-CM | POA: Insufficient documentation

## 2018-02-13 DIAGNOSIS — I351 Nonrheumatic aortic (valve) insufficiency: Secondary | ICD-10-CM | POA: Diagnosis not present

## 2018-02-13 DIAGNOSIS — Z95810 Presence of automatic (implantable) cardiac defibrillator: Secondary | ICD-10-CM | POA: Diagnosis not present

## 2018-02-13 DIAGNOSIS — I700 Atherosclerosis of aorta: Secondary | ICD-10-CM | POA: Diagnosis not present

## 2018-02-13 DIAGNOSIS — E785 Hyperlipidemia, unspecified: Secondary | ICD-10-CM | POA: Diagnosis not present

## 2018-02-13 DIAGNOSIS — J439 Emphysema, unspecified: Secondary | ICD-10-CM | POA: Diagnosis not present

## 2018-02-13 DIAGNOSIS — I471 Supraventricular tachycardia: Secondary | ICD-10-CM | POA: Diagnosis not present

## 2018-02-13 DIAGNOSIS — Z7982 Long term (current) use of aspirin: Secondary | ICD-10-CM | POA: Insufficient documentation

## 2018-02-13 DIAGNOSIS — I5022 Chronic systolic (congestive) heart failure: Secondary | ICD-10-CM | POA: Insufficient documentation

## 2018-02-13 DIAGNOSIS — X58XXXA Exposure to other specified factors, initial encounter: Secondary | ICD-10-CM | POA: Diagnosis not present

## 2018-02-13 DIAGNOSIS — I442 Atrioventricular block, complete: Secondary | ICD-10-CM | POA: Insufficient documentation

## 2018-02-13 DIAGNOSIS — M4854XA Collapsed vertebra, not elsewhere classified, thoracic region, initial encounter for fracture: Secondary | ICD-10-CM | POA: Insufficient documentation

## 2018-02-13 DIAGNOSIS — I5032 Chronic diastolic (congestive) heart failure: Secondary | ICD-10-CM | POA: Diagnosis not present

## 2018-02-13 DIAGNOSIS — I272 Pulmonary hypertension, unspecified: Secondary | ICD-10-CM | POA: Insufficient documentation

## 2018-02-13 DIAGNOSIS — Z86711 Personal history of pulmonary embolism: Secondary | ICD-10-CM | POA: Insufficient documentation

## 2018-02-13 DIAGNOSIS — R05 Cough: Secondary | ICD-10-CM | POA: Diagnosis not present

## 2018-02-13 DIAGNOSIS — J449 Chronic obstructive pulmonary disease, unspecified: Secondary | ICD-10-CM | POA: Insufficient documentation

## 2018-02-13 DIAGNOSIS — Z955 Presence of coronary angioplasty implant and graft: Secondary | ICD-10-CM | POA: Insufficient documentation

## 2018-02-13 DIAGNOSIS — E039 Hypothyroidism, unspecified: Secondary | ICD-10-CM | POA: Diagnosis not present

## 2018-02-13 DIAGNOSIS — I110 Hypertensive heart disease with heart failure: Secondary | ICD-10-CM | POA: Insufficient documentation

## 2018-02-13 LAB — MAGNESIUM: Magnesium: 2.2 mg/dL (ref 1.7–2.4)

## 2018-02-13 MED ORDER — PREDNISONE 20 MG PO TABS: ORAL_TABLET | ORAL | 0 refills | Status: DC

## 2018-02-13 MED ORDER — FUROSEMIDE 80 MG PO TABS: ORAL_TABLET | ORAL | 3 refills | Status: DC

## 2018-02-13 MED ORDER — DOXYCYCLINE HYCLATE 50 MG PO CAPS: 100.0000 mg | ORAL_CAPSULE | Freq: Two times a day (BID) | ORAL | 0 refills | Status: AC

## 2018-02-13 NOTE — Progress Notes (Signed)
PCP: Dr. Woody Seller EP: Dr. Lovena Le  HF Cardiology: Dr. Aundra Dubin  77 yo referred by Dr. Lovena Le for evaluation of CHF. Patient has a long cardiac history.  He had a prior PCI to the RCA with BMS.  Most recent cath in 2/17 showed nonobstructive CAD, patent RCA stent.  In 2009, echo showed EF 20-25%.  He went on to have ICD placed and later, CRT-D when he developed complete heart block requiring CRT upgrade. Most recent echo in 1/17 showed EF improved to 55-60%.   Recently, his Lasix has been pushed up due to evidence for volume overload via his device.  He feels like he is being over-diuresed.  He has lost a fair amount of weight. He hsa chronic dyspnea and fatigue walking up stairs or walking a longer distance on flat ground.  No chest pain.  He has noted wheezing and discolored sputum recently. He has been sleeping in a chair due to cough.  No fever.  No lightheadedness.    Medtronic device interrogation: Fluid index well below threshold, throacic impedance elevated.  No AF or VT.   Labs (1/19): LDL 80, HDL 42 Labs (4/19): K 3.7, creatinine 1.21  PMH: 1. Aortic insufficiency: Mild to moderate on most recent echo in 1/17.  2. HTN 3. B12 deficiency 4. Atrial tachycardia: S/p ablation in 1/19.  5. Hypothyroidism 6. Hyperlipidemia 7. Prior PE: Not currently on anticoagulation.  8. H/o VT: has CRT-D device.  9. CAD: h/o BMS to RCA.  - LHC (2/17) with mild nonobstructive CAD, patent RCA stent.  10. Complete heart block: Has Medtronic CRT-D device.   11. Chronic systolic CHF: Probably primarily nonischemic CMP as he does not have extensive enough CAD to explain CMP.  - Echo (2009): EF 20-25%.  - Echo (1/17): EF 55-60%, mild LV dilation, mild-moderate AI, normal RV size and systolic function.  - RHC (2/17): mean RA 10, PA 44/10, mean PCWP 12, CI 2.18.  12. COPD: Severe emphysema on prior chest CT.  Smoked in past.   Social History   Socioeconomic History  . Marital status: Married    Spouse name:  Not on file  . Number of children: 2  . Years of education: Not on file  . Highest education level: Not on file  Occupational History  . Occupation: Development worker, community - self employed    Employer: RETIRED  Social Needs  . Financial resource strain: Not on file  . Food insecurity:    Worry: Not on file    Inability: Not on file  . Transportation needs:    Medical: Not on file    Non-medical: Not on file  Tobacco Use  . Smoking status: Former Smoker    Packs/day: 1.00    Years: 43.00    Pack years: 43.00    Types: Cigarettes    Start date: 10/17/1957    Last attempt to quit: 12/13/2000    Years since quitting: 17.1  . Smokeless tobacco: Never Used  . Tobacco comment: started at age 89  Substance and Sexual Activity  . Alcohol use: No    Alcohol/week: 0.0 oz  . Drug use: No  . Sexual activity: Not Currently  Lifestyle  . Physical activity:    Days per week: Not on file    Minutes per session: Not on file  . Stress: Not on file  Relationships  . Social connections:    Talks on phone: Not on file    Gets together: Not on file  Attends religious service: Not on file    Active member of club or organization: Not on file    Attends meetings of clubs or organizations: Not on file    Relationship status: Not on file  . Intimate partner violence:    Fear of current or ex partner: Not on file    Emotionally abused: Not on file    Physically abused: Not on file    Forced sexual activity: Not on file  Other Topics Concern  . Not on file  Social History Narrative   Self employed Development worker, community; 2 daughters.    Family History  Problem Relation Age of Onset  . Diabetes type I Sister        daughter   . Lung cancer Brother        several types of cancer  . Lung cancer Brother        several types of cancer  . Lung cancer Brother   . Clotting disorder Neg Hx        also no repiratory disease   . Prostate cancer Neg Hx    ROS: All systems reviewed and negative except as per HPI.    Current Outpatient Medications  Medication Sig Dispense Refill  . aspirin EC 81 MG tablet Take 81 mg by mouth at bedtime.    . Calcium Carb-Cholecalciferol (CALCIUM 500 + D3 PO) Take by mouth.    . carvedilol (COREG) 12.5 MG tablet TAKE 1 TABLET (12.5 MG TOTAL) BY MOUTH 2 (TWO) TIMES DAILY. 180 tablet 3  . fluticasone furoate-vilanterol (BREO ELLIPTA) 100-25 MCG/INH AEPB Inhale 1 puff into the lungs daily. 60 each 11  . furosemide (LASIX) 80 MG tablet Take 1 tablet (80 mg total) by mouth every morning AND 0.5 tablets (40 mg total) every evening. 45 tablet 3  . levothyroxine (SYNTHROID, LEVOTHROID) 125 MCG tablet Take 1 tablet (125 mcg total) by mouth daily before breakfast. 30 tablet 3  . Multiple Vitamins-Minerals (MULTIVITAMIN WITH MINERALS) tablet Take 1 tablet by mouth daily.    . naproxen sodium (ALEVE) 220 MG tablet Take 220-440 mg by mouth 2 (two) times daily as needed (for pain.).    Marland Kitchen niacin 500 MG tablet Take 500 mg by mouth at bedtime.     Vladimir Faster Glycol-Propyl Glycol (LUBRICANT EYE DROPS) 0.4-0.3 % SOLN Place 1-2 drops into both eyes 2 (two) times daily as needed (for dry/irritated eyes.).    Marland Kitchen potassium chloride SA (K-DUR,KLOR-CON) 20 MEQ tablet Take 1 tablet (20 mEq total) by mouth 2 (two) times daily. 180 tablet 3  . simvastatin (ZOCOR) 20 MG tablet Take 1 tablet (20 mg total) by mouth at bedtime. 90 tablet 3  . trandolapril (MAVIK) 2 MG tablet Take 1 tablet (2 mg total) by mouth daily. 90 tablet 3  . VENTOLIN HFA 108 (90 Base) MCG/ACT inhaler INHALE TWO PUFFS BY MOUTH EVERY 6 HOURS AS NEEDED FOR WHEEZING OR  SHORTNESS  OF  BREATH 18 each 3  . doxycycline (VIBRAMYCIN) 50 MG capsule Take 2 capsules (100 mg total) by mouth 2 (two) times daily for 5 days. 20 capsule 0  . predniSONE (DELTASONE) 20 MG tablet Take 40 mg (2 tabs) daily for 3 days, then take 20 mg (1 tab) for 3 days 9 tablet 0   No current facility-administered medications for this encounter.    BP 115/65   Pulse  70   Wt 126 lb (57.2 kg)   SpO2 97%   BMI 18.61 kg/m  General:  NAD Neck: No JVD, no thyromegaly or thyroid nodule.  Lungs: End expiratory wheezes with occasional rhonchi, distant breath sounds.  CV: Nondisplaced PMI.  Heart distant sounds, regular S1/S2, no S3/S4, no murmur.  No peripheral edema.  No carotid bruit.  Normal pedal pulses.  Abdomen: Soft, nontender, no hepatosplenomegaly, no distention.  Skin: Intact without lesions or rashes.  Neurologic: Alert and oriented x 3.  Psych: Normal affect. Extremities: No clubbing or cyanosis.  HEENT: Normal.   Assessment/Plan: 1. Chronic diastolic CHF: Initially, EF low in 20-25% range in 2009.  However, over time, EF improved to 55-60% in 1/17.  Suspect primarily nonischemic cardiomyopathy as degree of coronary disease does not explain his initial low EF.  Patient has a Medtronic CRT-D device.  NYHA class III symptoms.  On exam and by Optivol, he does not appear volume overloaded (actually seems to trend more to the dry side).  Exam is concerning for COPD exacerbation rather than CHF.  - I will not adjust cardiac meds until I get an idea of his LV function.  Last echo showed normal EF in 2017, I will arrange for repeat echo.  - I will let him decrease Lasix to 80 qam/40 qpm.  Recent BMET with stable labs . 2. COPD: I am concerned for COPD exacerbation with discolored sputum and wheezing on lung exam.  - He will continue home inhalers.  - Short prednisone taper, 40 mg daily x 3 days then 20 mg daily x 3 days then stop.  - doxycycline 100 mg bid x 5 days.  - PA/lateral CXR to rule out PNA.  3. CAD: Cath in 2017 with nonobstructive disease, patent RCA stent.   - Continue statin, ASA 81.  4. Aortic insufficiency: Mild to moderate on last echo, will get repeat echo.  5. H/o atrial tachycardia: S/p ablation by Dr. Lovena Le, no palpitations.  6. Complete heart block: He has MDT CRT-D device.  7. Pulmonary hypertension: Noted on 2017 RHC, mild and  likely group 3 from COPD.   Followup in 1 month.   Loralie Champagne 02/14/2018

## 2018-02-13 NOTE — Patient Instructions (Signed)
Start Doxycycline 100 mg (2 tabs) , twice a day for 5 days  Start prednisone 40 mg (2 tabs) daily for 3 days  -Then (1 tab) daily for 3 days  Decrease Furosemide to 80 mg (2 tabs) in AM, then 40 mg (1 tab) in PM   Your physician has requested that you have an echocardiogram. Echocardiography is a painless test that uses sound waves to create images of your heart. It provides your doctor with information about the size and shape of your heart and how well your heart's chambers and valves are working. This procedure takes approximately one hour. There are no restrictions for this procedure.  Labs drawn today (if we do not call you, then your lab work was stable)   Your physician recommends that you schedule a follow-up appointment in: 1 month

## 2018-02-19 ENCOUNTER — Encounter: Payer: Medicare Other | Admitting: Internal Medicine

## 2018-02-20 ENCOUNTER — Encounter: Payer: Self-pay | Admitting: Internal Medicine

## 2018-02-20 ENCOUNTER — Ambulatory Visit (INDEPENDENT_AMBULATORY_CARE_PROVIDER_SITE_OTHER): Payer: Medicare Other | Admitting: Internal Medicine

## 2018-02-20 ENCOUNTER — Other Ambulatory Visit: Payer: Self-pay

## 2018-02-20 ENCOUNTER — Ambulatory Visit (HOSPITAL_COMMUNITY): Payer: Medicare Other | Attending: Cardiovascular Disease

## 2018-02-20 VITALS — BP 128/70 | HR 76 | Ht 69.0 in | Wt 127.0 lb

## 2018-02-20 DIAGNOSIS — Z950 Presence of cardiac pacemaker: Secondary | ICD-10-CM | POA: Diagnosis not present

## 2018-02-20 DIAGNOSIS — I471 Supraventricular tachycardia: Secondary | ICD-10-CM

## 2018-02-20 DIAGNOSIS — I5022 Chronic systolic (congestive) heart failure: Secondary | ICD-10-CM | POA: Diagnosis not present

## 2018-02-20 DIAGNOSIS — I255 Ischemic cardiomyopathy: Secondary | ICD-10-CM | POA: Diagnosis not present

## 2018-02-20 DIAGNOSIS — I420 Dilated cardiomyopathy: Secondary | ICD-10-CM | POA: Diagnosis not present

## 2018-02-20 DIAGNOSIS — I061 Rheumatic aortic insufficiency: Secondary | ICD-10-CM | POA: Insufficient documentation

## 2018-02-20 DIAGNOSIS — I5030 Unspecified diastolic (congestive) heart failure: Secondary | ICD-10-CM | POA: Diagnosis not present

## 2018-02-20 LAB — CUP PACEART INCLINIC DEVICE CHECK
Brady Statistic AP VP Percent: 35.5 %
Brady Statistic AP VS Percent: 0.09 %
Brady Statistic AS VP Percent: 63.33 %
Brady Statistic AS VS Percent: 1.08 %
Brady Statistic RA Percent Paced: 34.42 %
Brady Statistic RV Percent Paced: 95.99 %
Date Time Interrogation Session: 20190507104900
Implantable Lead Implant Date: 20050810
Implantable Lead Implant Date: 20050810
Implantable Lead Location: 753858
Implantable Lead Location: 753859
Implantable Lead Location: 753860
Implantable Pulse Generator Implant Date: 20170718
Lead Channel Impedance Value: 228 Ohm
Lead Channel Impedance Value: 380 Ohm
Lead Channel Impedance Value: 475 Ohm
Lead Channel Impedance Value: 627 Ohm
Lead Channel Pacing Threshold Amplitude: 0.5 V
Lead Channel Pacing Threshold Pulse Width: 0.4 ms
Lead Channel Sensing Intrinsic Amplitude: 5.75 mV
Lead Channel Sensing Intrinsic Amplitude: 5.75 mV
Lead Channel Setting Pacing Amplitude: 1.5 V
Lead Channel Setting Pacing Amplitude: 3 V
Lead Channel Setting Pacing Pulse Width: 0.6 ms
Lead Channel Setting Pacing Pulse Width: 1 ms
Lead Channel Setting Sensing Sensitivity: 2.8 mV
MDC IDC LEAD IMPLANT DT: 20071015
MDC IDC MSMT BATTERY REMAINING LONGEVITY: 40 mo
MDC IDC MSMT BATTERY VOLTAGE: 2.98 V
MDC IDC MSMT LEADCHNL LV IMPEDANCE VALUE: 475 Ohm
MDC IDC MSMT LEADCHNL LV IMPEDANCE VALUE: 608 Ohm
MDC IDC MSMT LEADCHNL RA IMPEDANCE VALUE: 475 Ohm
MDC IDC MSMT LEADCHNL RA IMPEDANCE VALUE: 532 Ohm
MDC IDC MSMT LEADCHNL RV IMPEDANCE VALUE: 589 Ohm
MDC IDC MSMT LEADCHNL RV PACING THRESHOLD AMPLITUDE: 1 V
MDC IDC MSMT LEADCHNL RV PACING THRESHOLD PULSEWIDTH: 0.4 ms
MDC IDC MSMT LEADCHNL RV SENSING INTR AMPL: 9 mV
MDC IDC MSMT LEADCHNL RV SENSING INTR AMPL: 9 mV
MDC IDC SET LEADCHNL LV PACING AMPLITUDE: 3.75 V

## 2018-02-20 LAB — ECHOCARDIOGRAM COMPLETE
HEIGHTINCHES: 69 in
Weight: 2032 oz

## 2018-02-20 MED ORDER — ESOMEPRAZOLE MAGNESIUM 20 MG PO CPDR: 20.0000 mg | DELAYED_RELEASE_CAPSULE | Freq: Every day | ORAL | 0 refills | Status: DC

## 2018-02-20 NOTE — Progress Notes (Signed)
HPI [name] returns today for followup. He is a pleasant 77 yo man with multiple medical problems including CHF, COPD, atrial tachy, LBBB, s/p PPM insertion. He c/o cough and sob when he lies down. He as been seen in the CHF clinic and is pending a repeat 2D echo. His cough occurs when he lies down to sleep at night. He does not normally take much for acid suppression.  Allergies  Allergen Reactions  . Spironolactone Other (See Comments)    gynecomastia     Current Outpatient Medications  Medication Sig Dispense Refill  . aspirin EC 81 MG tablet Take 81 mg by mouth at bedtime.    . Calcium Carb-Cholecalciferol (CALCIUM 500 + D3 PO) Take by mouth.    . carvedilol (COREG) 12.5 MG tablet TAKE 1 TABLET (12.5 MG TOTAL) BY MOUTH 2 (TWO) TIMES DAILY. 180 tablet 3  . fluticasone furoate-vilanterol (BREO ELLIPTA) 100-25 MCG/INH AEPB Inhale 1 puff into the lungs daily. 60 each 11  . furosemide (LASIX) 80 MG tablet Take 1 tablet (80 mg total) by mouth every morning AND 0.5 tablets (40 mg total) every evening. 45 tablet 3  . levothyroxine (SYNTHROID, LEVOTHROID) 125 MCG tablet Take 1 tablet (125 mcg total) by mouth daily before breakfast. 30 tablet 3  . Multiple Vitamins-Minerals (MULTIVITAMIN WITH MINERALS) tablet Take 1 tablet by mouth daily.    . niacin 500 MG tablet Take 500 mg by mouth at bedtime.     Vladimir Faster Glycol-Propyl Glycol (LUBRICANT EYE DROPS) 0.4-0.3 % SOLN Place 1-2 drops into both eyes 2 (two) times daily as needed (for dry/irritated eyes.).    Marland Kitchen potassium chloride SA (K-DUR,KLOR-CON) 20 MEQ tablet Take 1 tablet (20 mEq total) by mouth 2 (two) times daily. 180 tablet 3  . simvastatin (ZOCOR) 20 MG tablet Take 1 tablet (20 mg total) by mouth at bedtime. 90 tablet 3  . trandolapril (MAVIK) 2 MG tablet Take 1 tablet (2 mg total) by mouth daily. 90 tablet 3  . VENTOLIN HFA 108 (90 Base) MCG/ACT inhaler INHALE TWO PUFFS BY MOUTH EVERY 6 HOURS AS NEEDED FOR WHEEZING OR   SHORTNESS  OF  BREATH 18 each 3   No current facility-administered medications for this visit.      Past Medical History:  Diagnosis Date  . AICD (automatic cardioverter/defibrillator) present   . Aortic insufficiency     moderate aortic ins moderate/asymptomatic/normal LV cavity size.   . Carotid artery disease ([place])     less than 50% stenosis bilaterally  . Carotid artery disease ([place])   . Chronic systolic CHF (congestive heart failure) ([place])   . Coronary artery disease   . Drug-induced gynecomastia     secondary to spironolactone  . Dyslipidemia   . Hypertension   . ICD (implantable cardiac defibrillator), biventricular, in situ     Medtronic model  [id number]  . Ischemic cardiomyopathy     status post non-ST elevation microinfarction stent to the right coronary artery is a   . LV dysfunction     NYHA class I/prior ejection fraction 20-25% and an ejection fraction 7 2009 50-55%, ejection fraction 60% bedside echocardiogram July 2012   . PONV (postoperative nausea and vomiting)   . Pulmonary embolism ([facility])    Prior history of pulmonary embolism off Coumadin.  . Ventricular tachycardia ([place])    Inducible ventricular polymorphic tachycardia status post ICD followed by upgrade with CRT-D 2007, battery change at 01/31/2011    ROS:  All systems reviewed and negative except as noted in the HPI.   Past Surgical History:  Procedure Laterality Date  . ABLATION OF DYSRHYTHMIC FOCUS  10/20/2017   atrial tach  . ATRIAL TACH ABLATION N/A 10/20/2017   Procedure: ATRIAL TACH ABLATION;  Surgeon: Evans Lance, MD;  Location: [facility];  Service: Cardiovascular;  Laterality: N/A;  . CARDIAC CATHETERIZATION    . CARDIAC CATHETERIZATION N/A 12/10/2015   Procedure: Right/Left Heart Cath and Coronary Angiography;  Surgeon: Peter M Martinique, MD;  Location: [facility];  Service: Cardiovascular;  Laterality: N/A;  . CARDIAC DEFIBRILLATOR PLACEMENT     medtronic, remote - yes  .  CATARACT EXTRACTION W/PHACO Left 08/25/2014   Procedure: CATARACT EXTRACTION PHACO AND INTRAOCULAR LENS PLACEMENT (IOC);  Surgeon: Tonny Branch, MD;  Location: [facility];  Service: Ophthalmology;  Laterality: Left;  CDE 5.79  . CATARACT EXTRACTION W/PHACO Right 09/18/2014   Procedure: CATARACT EXTRACTION PHACO AND INTRAOCULAR LENS PLACEMENT RIGHT EYE;  Surgeon: Tonny Branch, MD;  Location: [facility];  Service: Ophthalmology;  Laterality: Right;  CDE:3.35  . CORONARY STENT PLACEMENT    . EP IMPLANTABLE DEVICE N/A 05/03/2016   Procedure: BIV PPM Generator Changeout;  Surgeon: Evans Lance, MD;  Location: [facility];  Service: Cardiovascular;  Laterality: N/A;  . INGUINAL HERNIA REPAIR    . left breast biopsy    . RETROPUBIC PROSTATECTOMY    . THORACOTOMY     left  . THYROID SURGERY    . THYROIDECTOMY       Family History  Problem Relation Age of Onset  . Diabetes type I Sister        daughter   . Lung cancer Brother        several types of cancer  . Lung cancer Brother        several types of cancer  . Lung cancer Brother   . Clotting disorder Neg Hx        also no repiratory disease   . Prostate cancer Neg Hx      Social History   Socioeconomic History  . Marital status: Married    Spouse name: Not on file  . Number of children: 2  . Years of education: Not on file  . Highest education level: Not on file  Occupational History  . Occupation: Development worker, community - self employed    Employer: RETIRED  Social Needs  . Financial resource strain: Not on file  . Food insecurity:    Worry: Not on file    Inability: Not on file  . Transportation needs:    Medical: Not on file    Non-medical: Not on file  Tobacco Use  . Smoking status: Former Smoker    Packs/day: 1.00    Years: 43.00    Pack years: 43.00    Types: Cigarettes    Start date: 10/17/1957    Last attempt to quit: 12/13/2000    Years since quitting: 17.2  . Smokeless tobacco: Never Used  . Tobacco comment: started at age 40    Substance and Sexual Activity  . Alcohol use: No    Alcohol/week: 0.0 oz  . Drug use: No  . Sexual activity: Not Currently  Lifestyle  . Physical activity:    Days per week: Not on file    Minutes per session: Not on file  . Stress: Not on file  Relationships  . Social connections:    Talks on phone: Not  on file    Gets together: Not on file    Attends religious service: Not on file    Active member of club or organization: Not on file    Attends meetings of clubs or organizations: Not on file    Relationship status: Not on file  . Intimate partner violence:    Fear of current or ex partner: Not on file    Emotionally abused: Not on file    Physically abused: Not on file    Forced sexual activity: Not on file  Other Topics Concern  . Not on file  Social History Narrative   Self employed Development worker, community; 2 daughters.      BP 128/70   Pulse 76   Ht 5\' 9"  (1.753 m)   Wt 127 lb (57.6 kg)   BMI 18.75 kg/m   Physical Exam:  Well appearing NAD HEENT: Unremarkable Neck:  No JVD, no thyromegally Lymphatics:  No adenopathy Back:  No CVA tenderness Lungs:  Clear with decreased breath sounds. HEART:  Regular rate rhythm, no murmurs, no rubs, no clicks Abd:  soft, positive bowel sounds, no organomegally, no rebound, no guarding Ext:  2 plus pulses, no edema, no cyanosis, no clubbing Skin:  No rashes no nodules Neuro:  CN II through XII intact, motor grossly intact   DEVICE  Normal device function.  See PaceArt for details.   Assess/Plan: 1. Atrial tachy - since is ablation in January, he has had essentially no atrial tachy. He will undergo watchful waiting. 2. Chronic diastolic heart failure - he is pending a 2D echo and followup with Dr. Aundra Dubin. He will continue his diuretic therapy. 3. COPD - he has been given a prednisone taper.  4. Nocturnal cough and sob - I wonder about acid reflux. I have asked that the patient take nexium daily.  Mikle [email or web address].

## 2018-02-20 NOTE — Patient Instructions (Addendum)
Medication Instructions:  Your physician has recommended you make the following change in your medication:  1.  Take Nexium daily for 3 weeks.  IF this helps your cough, continue indefinitely.  Labwork: None ordered.  Testing/Procedures: None ordered.  Follow-Up: Your physician wants you to follow-up in: one year with Dr. Lovena Le.   You will receive a reminder letter in the mail two months in advance. If you don't receive a letter, please call our office to schedule the follow-up appointment.  Remote monitoring is used to monitor your Pacemaker from home. This monitoring reduces the number of office visits required to check your device to one time per year. It allows [place] to keep an eye on the functioning of your device to ensure it is working properly. You are scheduled for a device check from home on 03/15/2018. You may send your transmission at any time that day. If you have a wireless device, the transmission will be sent automatically. After your physician reviews your transmission, you will receive a postcard with your next transmission date.  Any Other Special Instructions Will Be Listed Below (If Applicable).  If you need a refill on your cardiac medications before your next appointment, please call your pharmacy.

## 2018-02-21 ENCOUNTER — Ambulatory Visit (INDEPENDENT_AMBULATORY_CARE_PROVIDER_SITE_OTHER): Payer: Medicare Other | Admitting: Pulmonary Disease

## 2018-02-21 ENCOUNTER — Encounter: Payer: Self-pay | Admitting: Pulmonary Disease

## 2018-02-21 VITALS — BP 124/62 | HR 71 | Temp 98.0°F | Ht 69.0 in | Wt 126.2 lb

## 2018-02-21 DIAGNOSIS — Z95810 Presence of automatic (implantable) cardiac defibrillator: Secondary | ICD-10-CM

## 2018-02-21 DIAGNOSIS — Z9861 Coronary angioplasty status: Secondary | ICD-10-CM | POA: Diagnosis not present

## 2018-02-21 DIAGNOSIS — I5022 Chronic systolic (congestive) heart failure: Secondary | ICD-10-CM | POA: Diagnosis not present

## 2018-02-21 DIAGNOSIS — I2510 Atherosclerotic heart disease of native coronary artery without angina pectoris: Secondary | ICD-10-CM | POA: Diagnosis not present

## 2018-02-21 DIAGNOSIS — K219 Gastro-esophageal reflux disease without esophagitis: Secondary | ICD-10-CM | POA: Diagnosis not present

## 2018-02-21 DIAGNOSIS — Z86711 Personal history of pulmonary embolism: Secondary | ICD-10-CM | POA: Diagnosis not present

## 2018-02-21 DIAGNOSIS — I472 Ventricular tachycardia: Secondary | ICD-10-CM

## 2018-02-21 DIAGNOSIS — Z8701 Personal history of pneumonia (recurrent): Secondary | ICD-10-CM | POA: Diagnosis not present

## 2018-02-21 DIAGNOSIS — I272 Pulmonary hypertension, unspecified: Secondary | ICD-10-CM

## 2018-02-21 DIAGNOSIS — J432 Centrilobular emphysema: Secondary | ICD-10-CM

## 2018-02-21 DIAGNOSIS — I255 Ischemic cardiomyopathy: Secondary | ICD-10-CM

## 2018-02-21 DIAGNOSIS — I4729 Other ventricular tachycardia: Secondary | ICD-10-CM

## 2018-02-21 MED ORDER — IPRATROPIUM-ALBUTEROL 0.5-2.5 (3) MG/3ML IN SOLN: 3.0000 mL | Freq: Four times a day (QID) | RESPIRATORY_TRACT | 6 refills | Status: DC

## 2018-02-21 MED ORDER — PANTOPRAZOLE SODIUM 40 MG PO TBEC: 40.0000 mg | DELAYED_RELEASE_TABLET | Freq: Every day | ORAL | 2 refills | Status: DC

## 2018-02-21 NOTE — Progress Notes (Signed)
Subjective:    Patient ID: [name], male    DOB: January 02, 1941, 77 y.o.   MRN: [id number]  HPI   OLDER DATA in Epic>>   CTChest 12/29/10 showed borderline cardiomeg, pacer device on left, no adenopathy, extensive centrilobular emphysema, atx or scarring at left base, NAD...   Last CXR 02/08/11 showed cardiomeg, AICD/pacer on left, hyperinflated lungs, clear, NAD...  ~  December 14, 2015:  Initial pulmonary evaluation by SN>  His PCP is DrVyas in [place], [place] and his Cardiologist is Medical sales representative...    77 y/o WM referred for pulmonary evaluation due to dyspnea>  [name] is an ex-smoker having started in his teens, smoked for 40 yrs up to 1ppd and says he quit in the early 2000's when he had an MI;  He denies hx lung problems as a young man- no hx asthma, bronchitis, pneumonia, etc; he has had a mild cough, not much sput, no hemoptysis; he developed SOB/DOE over the last 10 yrs and sl progressive by his history; we do not have records from his PCP but he has been on BREO but only using it prn w/ minimal benefit=> he requested a pulmonary evaluation to see if we could help w/ his dyspnea... He has mult somatic complaints- sinus problems, can't breathe thru is nose well, etc; he gets choked occas when eating & his GI is in [place], we do not have records...     He was Adm 12/10/15 for CATH> known CAD w/ remote PCI to the RCA, ischemic cardiomyopathy, CHF, hx of VT w/ BiV-ICD in place; Cath showed patent stent in distal RCA, & min CAD w/ 20% midRCA and 30%proxCIRC, norm LVF (EF=55-65% & no wall motion abnormalities), mild pulmHTN (PAsys=44) w/ norm filling pressures... Since his increased dyspnea was not related to cardiac issue he requested pulmonary evaluation...  Smoking Hx>  40 pack-yr hx and quit in early 2000s...  Pulmonary Hx>  He had a remote PE (details unknown) & was on Coumadin in the past; not prev told about COPD but was started on Breo prn last yr...  Medical Hx>  Hx CAD, AI,  cardiomyopathy w/ prev LV dysfunction, hx VTach w/ ICD placed, HL, hx thyroid cancer w/ surg~2000,   Family Hx>  3 brothers have all had lung cancer!  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  Current Meds> Breo (only using prn), ASA81, Coreg25Bid, Mavik3mg /d, Lasix20-3/d, Inspra25/d, Simva20, Niacin500, Synthroid112, Prilosec40 EXAM shows Afeb, VSS, O2sat=96% on RA at rest;  HEENT- neg, mallapmpati1;  Chest- decr BS bilat w/o w/r/r;  Heart- RR w/o m/r/g heard;  Abd- soft, nontender, neg;  Ext- neg w/o c/c/e;  Neuro- intact...   CXR 12/13/14 showed norm heart size, tortuous thor Ao, AICD device on left, 1.2cm nodular density in LLL (not seen on last film 2012), otherw clear lungs...   Spirometry 12/13/14>  FVC=1.74 (41%), FEV1=0.86 (26%), %1sec=49%, mid-flows reduced at 12% predicted;  This is c/w severe obstructive ventilatory defect & GOLD Stage4 COPD...  Ambulatory Oximetry 12/13/14>  O2sat=93% on RA at rest w/ pulse=61;  He ambulated 2 Laps & stopped w/ dyspnea & nadir O2sat=88% w/ pulse=96/min...  LAB here 12/13/25>  Alpha-1-Antitrypsin level = 171 (83-199), and Phenotype= MM  CT Chest done 12/22/15> atherosclerotic vasc dis, AICD noted, prior thyroidectomy, severe emphysema & atx in lingula but NO LLL nodule seen (?prev artifact?), patchy interstitial & airsp opacities in RML & RLL w/ airway thickening & subsolid nodularity,  no adenopathy, end-plate compression T3, 70% compression T7 => needs BMD IMP >>     Severe COPD/ Emphysema w/ GOLD Stage 4 disease and severe dyspnea>      Ex-smoker, quit in 2000 w/ 40 pack-yr history    Mild pulmonary hypertension> PAsys=86mmHg on cath 11/2015    ? RUL pulm nodule on CXR, CT Chest w/o lesion seen    Hx pulmonary embolism> remote history, details are unknown to [place]...    CARDIAC issues>  CAD w/ prev PCI to RCA, ischemic cardiomyopathy, CHF, AI, Hx VT w/ BiV-ICD in place; followed by DrTaylor; pt states he had Rheumatic  fever as a child...    MEDICAL issues>  Pt w/ mult somatic complaints, HBP, HL, Hx thyroid ca & surg, Hx prostate cancer & surg, Compression fx T7 & T3; his PCP is DrVyas in [place]. PLAN >>     [name] has severe COPD/ emphysema w/ GOLD Stage 4 dis & FEV1=0.86 (26% predicted);  He is not on any regular breathing meds & we will start BREO once daily everyday, and INCRUSE once daily;  He needs exercise/ pulm rehab and we will pursue this in follow up;  He also has mild pulmHTN on his cath 11/2015 and he desaturates to 88% w/ ambulation=> therefore he needs HomeO2 portable system but he declines to start this now & we will have to ease him into the idea of using O2 and the benefits to his system;  He also c/o some dysphagia/ choking & I'm concerned about poss aspiration => we will arrange for a Barium Swallow & discussed te need to take the PPI 90min before dinner, NPO after dinner, Elev HOB 6", and f/u w/ his GI... we plan ROV in 6-8wks w/ FullPFTs to check LV & DLCO...  ~  February 04, 2016:  51mo ROV w/ SN>  60 y/o man w/ GOLD Stage 4 COPD (FEV1=0.86, 26% pred), ex-smoker quit in 2000, w/ SOB/DOE and mult somatic complaints; he has mild pulmHTN on Cath 11/2015 w/ PAsys=44 and he desaturated to 88% w/ ambulation (he declined to start HomeO2); CT Chest showed severe emphysema w/ some atx & scarring; he has known cardiac dis w/ prev PCI to the RCA, ischemic cardiomyopathy, CHF, AI, Hx VT w/ BiV-ICD in place; we were concerned about poss aspiration w/ c/o dysphagia/ choking & requested a Ba swallow but he cancelled the test; he is on BREO & INCRUSE, along w/ Omep40 & an antireflux regimen; we discussed the need for an exercise program & strongly encouraged Pulm Rehab program...  He states he could not tolerate the Incruse, so we had called in [place] but he never picked this med up due to cost;  We had a long discussion about the avail meds and limited options for rx!    Severe COPD/ Emphysema w/ GOLD Stage 4  disease and severe dyspnea>  On Breo alone, states "intol" to Incruse, never picked up the Spiriva; we discussed need for an anticholinergic & he will try the Spiriva Respimat- 2sp/d...    Ex-smoker, quit in 2000 w/ 40 pack-yr history    Mild pulmonary hypertension> PAsys=38mmHg on cath 11/2015, hehas declined to start Home O2 at this time & we will follow...    ? RUL pulm nodule on CXR, CT Chest w/o lesion seen    Hx pulmonary embolism> remote history, details are unknown to [place]...    CARDIAC issues>  CAD w/ prev PCI to RCA, ischemic cardiomyopathy, CHF, AI, Hx VT  w/ BiV-ICD in place=> changed to BiV pacer 7/17; followed by DrTaylor; pt states he had Rheumatic fever as a child...    MEDICAL issues>  Pt w/ mult somatic complaints, HBP, HL, Hx thyroid ca & surg, Hx prostate cancer & surg, Compression fx T7 & T3; his PCP is DrVyas in [place]. EXAM shows Afeb, VSS, O2sat=93% on RA at rest;  HEENT- neg, mallapmpati1;  Chest- decr BS bilat w/o w/r/r;  Heart- RR w/o m/r/g heard;  Abd- soft, nontender, neg;  Ext- neg w/o c/c/e;  Neuro- intact, anxious...   FullPFT 02/04/16>  FVC=2.99 (72%), FEV1=1.05 (35%), %1sec=35, mid-flows reduced at 11% predicted; post bronchodil there was a 19% incr in FEV1; TLC=7.52 (109%), RV=4.48 (181), RV/TLC=60%;  DLCO=27% predicted... This is c/w a severe obstructive ventilatory defect w/ air trapping, and a marked decr in DLCO c/w emphysema but there was also a signif reversible component post bronchodil... IMP/PLAN>>  I explained how he needs the ICS/LABA and Anticholinergic w/ his emphysema, + ProventilHFA rescue inhaler prn; this time he aslo needs home O2 but declines at present and PulmRehab (he will consider this)...  ~  July 04, 2016:  55mo ROV w/ SN>  [place] says he's breathing better & attributes the improvement to "short breathing thru my mouth"... 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     Severe COPD/ Emphysema w/ GOLD Stage 4 disease and severe dyspnea>  On Breo alone, states "intol" to Incruse, never picked up the Spiriva; we discussed need for an anticholinergic & he will try the Spiriva Respimat- 2sp/d (never got it- too $$);  Asked to try [place] one inhalation daily (sample provided)...    Ex-smoker, quit in 2000 w/ 40+ pack-yr history...    Mild pulmonary hypertension>  PAsys=63mmHg on cath 11/2015, prev 2DEcho 10/2015 est PAsys=69mmHg; he has declined to start Home O2 at this time & we will follow...    ? RUL pulm nodule on CXR, CT Chest w/o lesion seen    Hx pulmonary embolism> remote history, details are unknown to [place]... EXAM shows Afeb, VSS, O2sat=97% on RA at rest;  HEENT- neg, mallapmpati1;  Chest- decr BS bilat w/o w/r/r;  Heart- RR w/o m/r/g heard;  Abd- soft, nontender, neg;  Ext- neg w/o c/c/e;  Neuro- intact, anxious...   Ambulatory oximetry 07/04/16>  O2sat=93% on RA at rest w/ pulse=108/min;  He walked 3 laps (185'@) in the office w/ lowest O2sat=87% w/ pulse=96/min... Rec to start O2 w/ exercise BUT HE REFUSES OXYGEN AT THIS TIME.  IMP/PLAN>>  In addition to the above prob list- [name] has mild exercise induced hypoxemia w/ desat to 87% but refuses to start O2 w/ exercise (he is very stoic 7 points to his excellent exercise tolerance;  I have again asked him to start a LAMA med & wrote for Caprice Renshaw one inhalation daily;  Continue Breo, cardiac meds, etc...    ~  January 02, 2017:  15mo  [place] indicates he's doing well,  no problems, breathing well... Once again he has stopped the LAMA medication (this time it was [place]) added on to his medical regimen stating "it bothered my eyes" & he doesn't care that it will help his breathing in the long run... [place] never even mentioned that he was seen in the [facility] ER 10/05/16 after an electric drill that he was working with at home suddenly jerked back & struck him in the sternum; he did not fall or sustain any other injury; He was tender over the mid-sternum w/ swelling; O2sat was 93% on RA at rest; CXR did not show any sternal fx; EKG showed an atrial sensed and ventric paced rhythm; He was treated w/ Norco prn...  He was subseq ADM 12/23 - 10/10/16 by [facility] w/ increased SOB, chest discomfort, cough, ?fever;  CXR showed a patchy RLL opac suspicious for pneumonia;  Sputum grew a Stentrophomonas Maltophilia sens to [place];  Blood cultures NEG;  CBC was OK but WBC=14K;  He was treated w/ Zithromax & Unasyn=> switched to oral Zithromax & Augmentin & he responded nicely... He followed up w/ DrVyas (PCP) & had a f/u CXR 11/07/16 showing resolution of the right basilar infiltrate...     Severe COPD/ Emphysema w/ GOLD Stage 4 disease; Hx pneumonia RLL 12.2017 (sput + for Stentrophomonas Maltophilia sens to Levaquin & Sulfa) after chest wall trauma from a drill>  On Breo alone, states "intol" to Incruse, we discussed need for an anticholinergic & he will try the Spiriva Respimat- 2sp/d (never got it- too $$);  Asked to try TUDORZA one inhalation daily (but that "affected my eyes"); we will now offer Spiriva Handihaler Rx...     Ex-smoker, quit in 2000 w/ 40+ pack-yr history...    Mild pulmonary hypertension>  PAsys=94mmHg on cath 11/2015, prev 2DEcho 10/2015 est PAsys=39mmHg; he has declined to start Home O2 at this time; on [place]...    Exercise induced hypoxemia>  He dropped his O2sat to 87% walking on RA in our office in Sept2017=> he was rec to start O2 but he refused  Oxygen therapy at that time; he claims chr stable DOE w/ walking/ rushing/ stairs/ etc.    ?RUL pulm nodule on CXR, CT Chest w/o lesion seen    Hx pulmonary embolism> remote history, details are unknown to [place]...    CARDIAC issues>  CAD w/ prev PCI to RCA, ischemic cardiomyopathy, CHF, AI, Hx VT w/ BiV-ICD=> changed to BiV pacer 7/17; followed by DrTaylor; He has hx Rheumatic fever as a child...    MEDICAL issues>  Pt w/ mult somatic complaints, HBP, HL, Hx thyroid ca & surg, Hypothy, Hx prostate cancer & surg, Compression fx T7 & T3; his PCP is DrVyas in [place]. EXAM shows Afeb, VSS, O2sat=93% on RA at rest;  HEENT- neg, mallapmpati1;  Chest- decr BS bilat (unable to augment) w/o w/r/r;  Heart- RR w/o m/r/g heard;  Abd- soft, nontender, neg;  Ext- neg w/o c/c/e;  Neuro- intact, anxious...   CXR 09/2016 showed borderline cardiomeg, Ao calcification, COPD/ emphysema, right basilar opac c/w RLL pneumonia...  CXR 11/07/16>  boderline cardiomeg, Ao calcif, biventric pacer on left, COPD, mild scarring bilat & resolved RLL infiltrate, compression fxs mid Tspine  EKG 10/08/16>  Atrial sensed, ventirc paced rhythm IMP/PLAN>>  Jadis has severe COPD/ emphysema; he recovered from his RLL pneumonia 09/2016 w/o sequelae (his oxygenation was adeq & he did not require O2 at discharge);  we discussed again the benefit of a LAMA medication for his lung dis & rec trial of Spiriva Handihaler (we are running out of options); I explained that this dual therapy might help him stay off of oxygen longer... He will call for any questions and we plan rov in 46mo...   ~  Feb 21, 2018:  70yr ROV & pulmonary follow up visit>  [name] has severe COPD/emphysema w/ PFTs in 2017 showing FEV1=1.05 (35%), small revers component, air trapping on lung volumes and DLCO=27%;  He also had exercise induced hypoxemia on ambulation in our office 9/17 w/ O2sat down to 87% after 3 laps- he was rec to start O2 w/ exercise but he refused;  He has also  refused anticholinergic meds noting "side effects" from Spiriva and Incruse;  He was taking Breo one inhalation daily + Ventolin rescue spray prn but he stopped the Breo on his own ?in 2018 stating that "it doesn't help";  He never returned for our sched 36mo ROV in Sept-Oct 2018...     [name] returns today c/o 2-3 mo hx cough, mostly dry but w/ a littlebeige sputin AM, cough worse at night however (when supine & has been sleeping in recliner); as noted he stopped his BREO as "not helping" (ie- he is on NO meds x AlbutHFA rescue inhaler prn);  and tried Mucinex + coricidinHBP;  He notes SOB/ DOE w/ exertion... His PCP DrVyas in [place] has treated him with antibiotic & Pred per pt hx, which helped at the time but worse when meds wore off, DrMcLean re-up his antibiotic/ Pred taper with same response & recurrence when meds out...  We reviewed the following interval Epic notes recently recorded>     He was ADM 10/20/17 by DrTaylor for Ablation> hx symptomatic atrial tachy=> EPS/ ablationn was successful...    He saw Endocrine- DrNida on 01/18/18>  Hx thyroid cancer w/ surg 2005- he underwent total thyroidectomy in 2 stages in 2005 for stage I bilateral follicular variant papillary thyroid cancer (2 cm on the left lobe and 0.6 cm in the right lobe), followed by radioactive iodine thyroid remnant ablation in 04/2004 with subsequent 4 whole-body scans negative for tumor recurrence or distant metastasis until 2007;  surveillance ultrasound shows surgically absent thyroid and is negative for any thyroid mass or tumor recurrence; on levothyroxine100Qam w/ good compliance; TSH=25, Kandra Nicolas is low at 1.9 => REC to incr Levothyroid to 125/d...    He saw ENT- DrShoemaker 02/12/18>  C/o chr night time cough, post nasal drip & mucus; difficulty sleeping diue to cough,  CT Sinuses 02/12/18 showed normally aerated sinuses- NAD;  rec to use Saline, Flonase, & consider reflux eval via GI or PCP...    He saw CARDS/CHF- DrMcLean on 02/13/18>   Hx CAD-s/p PCI to RCA, hx CHF w/ 2DEcho in 2009 showing EF=20-25%, Hx VT & CHB w/ CRT-D placed and f/u 2DEcho 2017 showed EF=55-60%, diuresis adjusted based on device readings;  Notes chr dyspnea w/ stairs, or walking long distance, no CP, but was c/o cough & discolored phlegm prompting DrMcLean to rx w/ Doxy & Pred taper, plus decr Lasix to 80Qam&40Qpm...    He saw CARDS/EP- DrTaylor on 11/23/35>  Hx chr diastolic CHF, LBBB, AtrialTachy-s/p ablation 1/19, s/p pacer insertion; intol to Aldactone w/ gynecomastia; on ASA81, Coreg12.5Bid, Mavik2mg , Lasix-80Qam&40Qpm, K20Bid, Simva20;  Pacer interrogated & functioning normally; they gave him Nexium to try for nocturnal reflux...  We reviewed the following medical problems during today's office visit>  Severe COPD/ Emphysema w/ GOLD Stage 3-4 disease; Hx pneumonia RLL 09/2016 (sput +Stentrophomonas Maltophilia sens to Levaquin & Sulfa) after chest wall trauma from a drill>  Prev on Breo alone, states "intol" to Incruse, we discussed need for an anticholinergic & tried Spiriva Respimat- 2sp/d (never got it- too $$);  Asked to try TUDORZA one inhalation daily (but that "affected my eyes"); he refused anticholinergics & even stopped the Breo ("didn't help") => 02/2018 we bargained for NEBULIZER w/ DUONEB QID.Marland KitchenMarland Kitchen    Ex-smoker, quit in 2000 w/ 40+ pack-yr history...    Mild pulmonary hypertension>  PAsys=56mmHg on cath 11/2015, prev 2DEcho 10/2015 est PAsys=50mmHg; he has declined to start Home O2 in 2017 when ambulatory hypoxemia was discovered; on [place] => CARDS has adjusted his diuretics and stopped the Eplerenone...     Exercise induced hypoxemia>  He dropped his O2sat to 87% walking on RA in our office in Sept2017=> he was rec to start O2 but he refused Oxygen therapy; he claims chr stable DOE w/ walking/ rushing/ stairs/ etc.    ?RUL pulm nodule on CXR, but CT Chest w/o lesion seen...    Hx pulmonary embolism> remote history, details are unknown to  [place]...    c/o nocturnal reflux, LPR>  Noted 5/19 & started on a vigorous antireflux regimen including: Protonix40 taken 34min before dinner, NPO after dinner, elev HOB 6"    CARDIAC issues>  CAD w/ prev PCI to RCA, ischemic cardiomyopathy, sys&diast CHF, AI, Hx VT w/ BiV-ICD=> changed to BiV pacer 7/17; followed by DrTaylor; He has hx Rheumatic fever as a child...    MEDICAL issues>  Pt w/ mult somatic complaints, HBP, HL, Hx thyroid ca & surg, Hypothy, Hx prostate cancer & surg, Compression fx T7 & T3; his PCP is DrVyas in [place]. EXAM shows Afeb, VSS, O2sat=98% on RA at rest;  HEENT- neg, mallapmpati1;  Chest- decr BS bilat (unable to augment) w/o w/r/r;  Heart- RR w/o m/r/g heard;  Abd- soft, nontender, neg;  Ext- neg w/o c/c/e;  Neuro- intact, anxious...  ABGs in Epic from 2017 showed pCO2= 47-53  LABS 01/2018>  [place]- ok w/ K=3.7, HCO3=30, Cr=1.21;  CBC- wnl w/ Hg=14.0;  TSH=25, FreeT3=1.9 and his Levothy100 was incr to 111mcg/d...  CXR 02/13/18 showed borderline cardiomeg, tortuous atherosclerotic ao, hyperinflation- COPD/emphysema w/ scaterred areas of scarring- NAD, Pacer/ mid Tspine compression fx, osteopenia...  2DEcho => f/u 2DEcho per cards is pending IMP/PLAN>>  [name] has severe COPD/emphysema & is literally on NO MEDS due to his refusal to stick w/ inhalers; we discussed option for NEBULIZER + DUONEB Qid (breakfast, lunch, dinner, bedtime) & have this submitted under Medicare PartB;  In addition he is in need of a vigorous antireflux regimen or else he will need referral to GI for more extensive evaluation;  We discussed PROTONIx40mg  taken 30 min before the eve meal, NPO after dinner, elev the HOB 6" => he indicated that he will comply;  Finally he can use GFN400- 2Tid w/ fluids for any thick phlegm;  We plan ROV recheck in 6-8 weeks...  NOTE:  >50% of this 66min rov was spent in counseling & coordination of care...     Past Medical History:  Diagnosis Date  . AICD (automatic  cardioverter/defibrillator) present   . Aortic insufficiency     moderate aortic ins moderate/asymptomatic/normal LV cavity size.   . Carotid artery disease ([place])     less than 50% stenosis bilaterally  . Carotid artery disease ([place])   .  Chronic systolic CHF (congestive heart failure) ([place])   . Coronary artery disease   . Drug-induced gynecomastia     secondary to spironolactone  . Dyslipidemia   . Hypertension   . ICD (implantable cardiac defibrillator), biventricular, in situ     Medtronic model  [id number]  . Ischemic cardiomyopathy     status post non-ST elevation microinfarction stent to the right coronary artery is a   . LV dysfunction     NYHA class I/prior ejection fraction 20-25% and an ejection fraction 7 2009 50-55%, ejection fraction 60% bedside echocardiogram July 2012   . PONV (postoperative nausea and vomiting)   . Pulmonary embolism ([facility])    Prior history of pulmonary embolism off Coumadin.  . Ventricular tachycardia ([place])    Inducible ventricular polymorphic tachycardia status post ICD followed by upgrade with CRT-D 2007, battery change at 01/31/2011    Past Surgical History:  Procedure Laterality Date  . ABLATION OF DYSRHYTHMIC FOCUS  10/20/2017   atrial tach  . ATRIAL TACH ABLATION N/A 10/20/2017   Procedure: ATRIAL TACH ABLATION;  Surgeon: Evans Lance, MD;  Location: [facility];  Service: Cardiovascular;  Laterality: N/A;  . CARDIAC CATHETERIZATION    . CARDIAC CATHETERIZATION N/A 12/10/2015   Procedure: Right/Left Heart Cath and Coronary Angiography;  Surgeon: Peter M Martinique, MD;  Location: [facility];  Service: Cardiovascular;  Laterality: N/A;  . CARDIAC DEFIBRILLATOR PLACEMENT     medtronic, remote - yes  . CATARACT EXTRACTION W/PHACO Left 08/25/2014   Procedure: CATARACT EXTRACTION PHACO AND INTRAOCULAR LENS PLACEMENT (IOC);  Surgeon: Tonny Branch, MD;  Location: [facility];  Service: Ophthalmology;  Laterality: Left;  CDE 5.79  . CATARACT  EXTRACTION W/PHACO Right 09/18/2014   Procedure: CATARACT EXTRACTION PHACO AND INTRAOCULAR LENS PLACEMENT RIGHT EYE;  Surgeon: Tonny Branch, MD;  Location: [facility];  Service: Ophthalmology;  Laterality: Right;  CDE:3.35  . CORONARY STENT PLACEMENT    . EP IMPLANTABLE DEVICE N/A 05/03/2016   Procedure: BIV PPM Generator Changeout;  Surgeon: Evans Lance, MD;  Location: [facility];  Service: Cardiovascular;  Laterality: N/A;  . INGUINAL HERNIA REPAIR    . left breast biopsy    . RETROPUBIC PROSTATECTOMY    . THORACOTOMY     left  . THYROID SURGERY    . THYROIDECTOMY      Outpatient Encounter Medications as of 02/21/2018  Medication Sig  . aspirin EC 81 MG tablet Take 81 mg by mouth at bedtime.  . Calcium Carb-Cholecalciferol (CALCIUM 500 + D3 PO) Take by mouth.  . carvedilol (COREG) 12.5 MG tablet TAKE 1 TABLET (12.5 MG TOTAL) BY MOUTH 2 (TWO) TIMES DAILY.  Marland Kitchen esomeprazole (NEXIUM) 20 MG capsule Take 1 capsule (20 mg total) by mouth daily at 12 noon.  . furosemide (LASIX) 80 MG tablet Take 1 tablet (80 mg total) by mouth every morning AND 0.5 tablets (40 mg total) every evening.  Marland Kitchen levothyroxine (SYNTHROID, LEVOTHROID) 125 MCG tablet Take 1 tablet (125 mcg total) by mouth daily before breakfast.  . Multiple Vitamins-Minerals (MULTIVITAMIN WITH MINERALS) tablet Take 1 tablet by mouth daily.  . niacin 500 MG tablet Take 500 mg by mouth at bedtime.   Vladimir Faster Glycol-Propyl Glycol (LUBRICANT EYE DROPS) 0.4-0.3 % SOLN Place 1-2 drops into both eyes 2 (two) times daily as needed (for dry/irritated eyes.).  Marland Kitchen potassium chloride SA (K-DUR,KLOR-CON) 20 MEQ tablet Take 1 tablet (20 mEq total) by mouth 2 (two) times daily.  Marland Kitchen  simvastatin (ZOCOR) 20 MG tablet Take 1 tablet (20 mg total) by mouth at bedtime.  . trandolapril (MAVIK) 2 MG tablet Take 1 tablet (2 mg total) by mouth daily.  . VENTOLIN HFA 108 (90 Base) MCG/ACT inhaler INHALE TWO PUFFS BY MOUTH EVERY 6 HOURS AS NEEDED FOR WHEEZING OR   SHORTNESS  OF  BREATH  . fluticasone furoate-vilanterol (BREO ELLIPTA) 100-25 MCG/INH AEPB Inhale 1 puff into the lungs daily. (Patient not taking: Reported on 02/21/2018)    Allergies  Allergen Reactions  . Spironolactone Other (See Comments)    gynecomastia     Immunization History  Administered Date(s) Administered  . Influenza, High Dose Seasonal PF 08/31/2014, 10/01/2015, 08/29/2016  . Influenza-Unspecified 07/20/2017  . Pneumococcal Polysaccharide-23 10/17/2013    Current Medications, Allergies, Past Medical History, Past Surgical History, Family History, and Social History were reviewed in [employer] record.   Review of Systems  Constitutional: Negative for fever and unexpected weight change.  HENT: Positive for congestion, sneezing and trouble swallowing. Negative for dental problem, ear pain, nosebleeds, postnasal drip, rhinorrhea, sinus pressure and sore throat.   Eyes: Negative for redness and itching.  Respiratory: Positive for shortness of breath. Negative for cough, chest tightness and wheezing.   Cardiovascular: Negative for palpitations and leg swelling.  Gastrointestinal: Negative for nausea and vomiting.  Genitourinary: Negative for dysuria.  Musculoskeletal: Positive for joint swelling.  Skin: Positive for rash ( itching).  Neurological: Negative for headaches.  Hematological: Does not bruise/bleed easily.  Psychiatric/Behavioral: Negative for dysphoric mood. The patient is not nervous/anxious.       Objective:   Physical Exam  Vital Signs:  Reviewed...   General:  WD, WN, 77 y/o WM in NAD; alert & oriented; pleasant & cooperative... HEENT:  [place]/AT; Conjunctiva- pink, Sclera- nonicteric, EOM-wnl, PERRLA, EACs-clear, TMs-wnl; NOSE-clear; THROAT-clear & wnl.  Neck:  Supple w/ fair ROM; no JVD; normal carotid impulses w/o bruits; no thyromegaly or nodules palpated; no lymphadenopathy.  Chest:  Decr BS bilat, clear to P & A; without  wheezes, rales, or rhonchi heard. Heart:  Regular Rhythm; AICD on left; Gr 1/6 murmur, no rubs or gallops detected. Abdomen:  Soft & nontender- no guarding or rebound; normal bowel sounds; no organomegaly or masses palpated. Ext:  DecrROM; without deformities +arthritic changes; no varicose veins, venous insuffic, or edema;  Pulses intact w/o bruits. Neuro:  No focal neuro deficits; sensory testing normal; gait normal & balance OK; he is anxious. Derm:  No lesions noted; no rash etc. Lymph:  No cervical, supraclavicular, axillary, or inguinal adenopathy palpated.     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 RUL pulm nodule on CXR, CT Chest w/o lesion seen    Hx pulmonary embolism> remote history, details are unknown to [place]...    C/O nocturnal reflux, LPR>  Noted 5/19 & started on a vigorous antireflux regimen including: Protonix40 taken 38min before dinner, NPO after dinner, elev HOB 6"    CARDIAC issues>  CAD w/ prev PCI to RCA, ischemic cardiomyopathy, CHF, AI, Hx VT w/ BiV-ICD in place; followed by DrTaylor; pt states he had Rheumatic fever as a child...    MEDICAL issues>  Pt w/ mult somatic complaints, HBP, HL, Hx thyroid ca & surg,  Hx prostate cancer & surg, Compression fx T7 & T3; his PCP is DrVyas in [place].  PLAN >>  12/14/15>   [name] has severe COPD/ emphysema w/ GOLD Stage 4 dis & FEV1=0.86 (26% predicted);  He is not on any regular breathing meds & we will start BREO once daily everyday, and INCRUSE once daily;  He needs exercise/ pulm rehab and we will pursue this in follow up;  He also has mild pulmHTN on his cath 11/2015 and he desaturates to 88% w/ ambulation=> therefore he needs HomeO2 portable system but he declines to start this now & we will have to ease him into the idea of using O2 and the benefits  to his system;  He also c/o some dysphagia/ choking & I'm concerned about poss aspiration => we will arrange for a Barium Swallow & discussed te need to take the PPI 43min before dinner, NPO after dinner, Elev HOB 6", and f/u w/ his GI... 4/20>   I explained how he needs the ICS/LABA and Anticholinergic w/ his emphysema, + ProventilHFA rescue inhaler prn; he aslo needs home O2 but declines at present and PulmRehab (he will consider this)... 07/04/16>   In addition to the above prob list- [name] has mild exercise induced hypoxemia w/ desat to 87% but refuses to start O2 w/ exercise (he is very stoic 7 points to his excellent exercise tolerance;  I have again asked him to start a LAMA med & wrote for Caprice Renshaw one inhalation daily;  Continue Breo, cardiac meds, etc. 01/02/17>   [place] has severe COPD/ emphysema; he recovered from his RLL pneumonia 09/2016 w/o sequelae (his oxygenation was adeq & he did not require O2 at discharge); we discussed again the benefit of a LAMA medication for his lung dis & rec trial of Spiriva Handihaler (we are running out of options); I explained that this dual therapy might help him stay off of oxygen longer...    Patient's Medications  New Prescriptions   IPRATROPIUM-ALBUTEROL (DUONEB) 0.5-2.5 (3) MG/3ML SOLN    Take 3 mLs by nebulization 4 (four) times daily.   PANTOPRAZOLE (PROTONIX) 40 MG TABLET    Take 1 tablet (40 mg total) by mouth daily. Take 30 min before bedtime  Previous Medications   ASPIRIN EC 81 MG TABLET    Take 81 mg by mouth at bedtime.   CALCIUM CARB-CHOLECALCIFEROL (CALCIUM 500 + D3 PO)    Take by mouth.   CARVEDILOL (COREG) 12.5 MG TABLET    TAKE 1 TABLET (12.5 MG TOTAL) BY MOUTH 2 (TWO) TIMES DAILY.   ESOMEPRAZOLE (NEXIUM) 20 MG CAPSULE    Take 1 capsule (20 mg total) by mouth daily at 12 noon.   FUROSEMIDE (LASIX) 80 MG TABLET    Take 1 tablet (80 mg total) by mouth every morning AND 0.5 tablets (40 mg total) every evening.   LEVOTHYROXINE (SYNTHROID,  LEVOTHROID) 125 MCG TABLET    Take 1 tablet (125 mcg total) by mouth daily before breakfast.   MULTIPLE VITAMINS-MINERALS (MULTIVITAMIN WITH MINERALS) TABLET    Take 1 tablet by mouth daily.   NIACIN 500 MG TABLET    Take 500 mg by mouth at bedtime.    POLYETHYL GLYCOL-PROPYL GLYCOL (LUBRICANT EYE DROPS) 0.4-0.3 % SOLN    Place 1-2 drops into both eyes 2 (two) times daily as needed (for dry/irritated eyes.).   POTASSIUM CHLORIDE SA (K-DUR,KLOR-CON) 20 MEQ TABLET    Take 1 tablet (20 mEq total) by mouth 2 (two) times daily.  SIMVASTATIN (ZOCOR) 20 MG TABLET    Take 1 tablet (20 mg total) by mouth at bedtime.   TRANDOLAPRIL (MAVIK) 2 MG TABLET    Take 1 tablet (2 mg total) by mouth daily.   VENTOLIN HFA 108 (90 BASE) MCG/ACT INHALER    INHALE TWO PUFFS BY MOUTH EVERY 6 HOURS AS NEEDED FOR WHEEZING OR  SHORTNESS  OF  BREATH  Modified Medications   No medications on file  Discontinued Medications                     BREO - pt had discontinued this inhaler on his own sometime in 2018.Marland KitchenMarland Kitchen

## 2018-02-21 NOTE — Patient Instructions (Signed)
Today we updated your med list in our EPIC system...     We decided to try you on a NEBULIZER with DUONEB medication 4 times daily (breakfast, lunch, dinner, bedtime)    Work to expectorate any mucus from your lungs...  In addition add-in the OTC GUAIFENESIN medication to loosen thick phlegm & make it easier to expectorate...    Try MUCINEX 1200mg  twice daily (one 1200mg  tab twice/d or two of the 600mg  tabs twice/d)...    Ot try the sl cheaper MUCUS RELIEF - these are 400mg  tabs and take 2 tabs three times daily...    Be sure to take a big glass of water/fluids with each dose...  We also discussed the nocturnal cough, noting that this almost always comes from reflux that occurs at night>>    REC to start a good antireflux regimen as follows:  1) take the PROTONIX (Pantoprazole) 40mg  one tab about 30 min before the eve meal, then eat dinner...  2) do not eat or drink after dinner (allowing your stomach to totally empy out...  3) elevate the head of your bed ~6" on blocks...  Call for any questions...  Let's plan a follow up visit in 6-8 weeks, sooner if needed for problems.Marland KitchenMarland Kitchen

## 2018-02-23 NOTE — Progress Notes (Signed)
ICM remote transmission rescheduled for 03/26/2018 due to patient had in office defib check on 02/20/2018 with Dr Lovena Le.

## 2018-02-27 DIAGNOSIS — J449 Chronic obstructive pulmonary disease, unspecified: Secondary | ICD-10-CM | POA: Diagnosis not present

## 2018-02-27 DIAGNOSIS — I2510 Atherosclerotic heart disease of native coronary artery without angina pectoris: Secondary | ICD-10-CM | POA: Diagnosis not present

## 2018-02-27 DIAGNOSIS — E7800 Pure hypercholesterolemia, unspecified: Secondary | ICD-10-CM | POA: Diagnosis not present

## 2018-02-27 DIAGNOSIS — M159 Polyosteoarthritis, unspecified: Secondary | ICD-10-CM | POA: Diagnosis not present

## 2018-02-28 ENCOUNTER — Telehealth: Payer: Self-pay | Admitting: Pulmonary Disease

## 2018-02-28 NOTE — Telephone Encounter (Signed)
Checked SN's look-at and front cubby, no paperwork on this patient. Called [facility], states they need a form filled out for pt to receive their medication.  Verified fax # with pharmacy tech.    Spoke with pt's spouse to make aware.  Will await form.

## 2018-03-02 ENCOUNTER — Telehealth: Payer: Self-pay | Admitting: Pulmonary Disease

## 2018-03-02 ENCOUNTER — Telehealth (HOSPITAL_COMMUNITY): Payer: Self-pay

## 2018-03-02 NOTE — Telephone Encounter (Signed)
Please arrange for office visit next week early in the week.

## 2018-03-02 NOTE — Telephone Encounter (Signed)
Called and spoke to patient's wife, Hassan Rowan. She stated that patient continues to have a cough and has needed to sleep propped up because he doesn't breathe well laying down. Denies fever but reports increased tiredness/weakness. Patient's wife stated that he has become more nervous/anxious and is actively having visual hallucinations. Patient stated that he is seeing people that aren't there and also seeing rats.   Dr. Lenna Gilford please advise.   Allergies  Allergen Reactions  . Spironolactone Other (See Comments)    gynecomastia    Current Outpatient Medications on File Prior to Visit  Medication Sig Dispense Refill  . aspirin EC 81 MG tablet Take 81 mg by mouth at bedtime.    . Calcium Carb-Cholecalciferol (CALCIUM 500 + D3 PO) Take by mouth.    . carvedilol (COREG) 12.5 MG tablet TAKE 1 TABLET (12.5 MG TOTAL) BY MOUTH 2 (TWO) TIMES DAILY. 180 tablet 3  . esomeprazole (NEXIUM) 20 MG capsule Take 1 capsule (20 mg total) by mouth daily at 12 noon. 30 capsule 0  . fluticasone furoate-vilanterol (BREO ELLIPTA) 100-25 MCG/INH AEPB Inhale 1 puff into the lungs daily. (Patient not taking: Reported on 02/21/2018) 60 each 11  . furosemide (LASIX) 80 MG tablet Take 1 tablet (80 mg total) by mouth every morning AND 0.5 tablets (40 mg total) every evening. 45 tablet 3  . ipratropium-albuterol (DUONEB) 0.5-2.5 (3) MG/3ML SOLN Take 3 mLs by nebulization 4 (four) times daily. 120 mL 6  . levothyroxine (SYNTHROID, LEVOTHROID) 125 MCG tablet Take 1 tablet (125 mcg total) by mouth daily before breakfast. 30 tablet 3  . Multiple Vitamins-Minerals (MULTIVITAMIN WITH MINERALS) tablet Take 1 tablet by mouth daily.    . niacin 500 MG tablet Take 500 mg by mouth at bedtime.     . pantoprazole (PROTONIX) 40 MG tablet Take 1 tablet (40 mg total) by mouth daily. Take 30 min before bedtime 90 tablet 2  . Polyethyl Glycol-Propyl Glycol (LUBRICANT EYE DROPS) 0.4-0.3 % SOLN Place 1-2 drops into both eyes 2 (two) times daily as  needed (for dry/irritated eyes.).    Marland Kitchen potassium chloride SA (K-DUR,KLOR-CON) 20 MEQ tablet Take 1 tablet (20 mEq total) by mouth 2 (two) times daily. 180 tablet 3  . simvastatin (ZOCOR) 20 MG tablet Take 1 tablet (20 mg total) by mouth at bedtime. 90 tablet 3  . trandolapril (MAVIK) 2 MG tablet Take 1 tablet (2 mg total) by mouth daily. 90 tablet 3  . VENTOLIN HFA 108 (90 Base) MCG/ACT inhaler INHALE TWO PUFFS BY MOUTH EVERY 6 HOURS AS NEEDED FOR WHEEZING OR  SHORTNESS  OF  BREATH 18 each 3   No current facility-administered medications on file prior to visit.

## 2018-03-02 NOTE — Telephone Encounter (Signed)
Called and spoke to patient's wife again and reviewed Dr. Jeannine Kitten recommendations.  Dr. Lenna Gilford stated that the issue could be related to his heart and to follow up with his cardiologist. Patient's wife reported that she will follow up with them. SN had recommended that patient get an ONO for possible oxygen but patient had refused oxygen previously in 2017. Patient's wife declined the ONO and stated that is not what the problem is and that she believes the issue is his Duoneb. Explained to wife that the Duoneb would not be causing these symptoms including the hallucinations. Wife stated that patient is not hallucinating and seemed upset that the word hallucination was used. Wife stated, "he is just seeing things." Explained to wife that seeing things that are not real and are not there is what a visual hallucination is. Advised her that patient needs to be seen by his PCP as soon as possible. Hassan Rowan stated that she will call PCP and see if they can get him in today. Nothing further is needed from Pulmonary at this time.

## 2018-03-02 NOTE — Telephone Encounter (Signed)
Advanced Heart Failure Triage Encounter  Patient Name: SALBADOR FIVEASH  Date of Call: 03/02/18  Problem:  Pt wife Hassan Rowan called b/c Pt is feeling more tiered than usual and has no enery going on for about a week. She states he is having more SOB. Pt. denies SOB, CP,and edema. Has a slight cough and has been losing weight from poor appetite.       Plan:    Shirley Muscat, RN

## 2018-03-02 NOTE — Telephone Encounter (Signed)
Received fax and signature from SN. Faxed to [place] and nothing further is needed.

## 2018-03-05 NOTE — Telephone Encounter (Signed)
Done on 5/17 pt wife agreeable to plan no further questions

## 2018-03-06 ENCOUNTER — Encounter (HOSPITAL_COMMUNITY): Payer: Self-pay | Admitting: Cardiology

## 2018-03-06 ENCOUNTER — Other Ambulatory Visit: Payer: Self-pay

## 2018-03-06 ENCOUNTER — Ambulatory Visit (HOSPITAL_COMMUNITY)
Admission: RE | Admit: 2018-03-06 | Discharge: 2018-03-06 | Disposition: A | Discharge status: Home / Self Care | Payer: Medicare Other | Source: Ambulatory Visit | Attending: Cardiology | Admitting: Cardiology

## 2018-03-06 VITALS — BP 141/69 | HR 63 | Wt 130.0 lb

## 2018-03-06 DIAGNOSIS — Z79899 Other long term (current) drug therapy: Secondary | ICD-10-CM | POA: Insufficient documentation

## 2018-03-06 DIAGNOSIS — I471 Supraventricular tachycardia: Secondary | ICD-10-CM | POA: Diagnosis not present

## 2018-03-06 DIAGNOSIS — I272 Pulmonary hypertension, unspecified: Secondary | ICD-10-CM | POA: Insufficient documentation

## 2018-03-06 DIAGNOSIS — E538 Deficiency of other specified B group vitamins: Secondary | ICD-10-CM | POA: Diagnosis not present

## 2018-03-06 DIAGNOSIS — I351 Nonrheumatic aortic (valve) insufficiency: Secondary | ICD-10-CM | POA: Insufficient documentation

## 2018-03-06 DIAGNOSIS — E785 Hyperlipidemia, unspecified: Secondary | ICD-10-CM | POA: Diagnosis not present

## 2018-03-06 DIAGNOSIS — I2510 Atherosclerotic heart disease of native coronary artery without angina pectoris: Secondary | ICD-10-CM | POA: Insufficient documentation

## 2018-03-06 DIAGNOSIS — I442 Atrioventricular block, complete: Secondary | ICD-10-CM | POA: Diagnosis not present

## 2018-03-06 DIAGNOSIS — Z955 Presence of coronary angioplasty implant and graft: Secondary | ICD-10-CM | POA: Insufficient documentation

## 2018-03-06 DIAGNOSIS — Z9889 Other specified postprocedural states: Secondary | ICD-10-CM | POA: Diagnosis not present

## 2018-03-06 DIAGNOSIS — Z79890 Hormone replacement therapy (postmenopausal): Secondary | ICD-10-CM | POA: Insufficient documentation

## 2018-03-06 DIAGNOSIS — Z7982 Long term (current) use of aspirin: Secondary | ICD-10-CM | POA: Insufficient documentation

## 2018-03-06 DIAGNOSIS — E039 Hypothyroidism, unspecified: Secondary | ICD-10-CM | POA: Diagnosis not present

## 2018-03-06 DIAGNOSIS — Z95810 Presence of automatic (implantable) cardiac defibrillator: Secondary | ICD-10-CM | POA: Insufficient documentation

## 2018-03-06 DIAGNOSIS — I5022 Chronic systolic (congestive) heart failure: Secondary | ICD-10-CM | POA: Insufficient documentation

## 2018-03-06 DIAGNOSIS — J449 Chronic obstructive pulmonary disease, unspecified: Secondary | ICD-10-CM | POA: Insufficient documentation

## 2018-03-06 DIAGNOSIS — Z86711 Personal history of pulmonary embolism: Secondary | ICD-10-CM | POA: Diagnosis not present

## 2018-03-06 DIAGNOSIS — I110 Hypertensive heart disease with heart failure: Secondary | ICD-10-CM | POA: Insufficient documentation

## 2018-03-06 DIAGNOSIS — Z87891 Personal history of nicotine dependence: Secondary | ICD-10-CM | POA: Diagnosis not present

## 2018-03-06 DIAGNOSIS — E890 Postprocedural hypothyroidism: Secondary | ICD-10-CM | POA: Diagnosis not present

## 2018-03-06 LAB — T4, FREE: FREE T4: 1.6 ng/dL (ref 0.8–1.8)

## 2018-03-06 LAB — TSH: TSH: 13.09 mIU/L — ABNORMAL HIGH (ref 0.40–4.50)

## 2018-03-06 MED ORDER — BISOPROLOL FUMARATE 5 MG PO TABS: 5.0000 mg | ORAL_TABLET | Freq: Every day | ORAL | 3 refills | Status: DC

## 2018-03-06 MED ORDER — SACUBITRIL-VALSARTAN 24-26 MG PO TABS: 1.0000 | ORAL_TABLET | Freq: Two times a day (BID) | ORAL | 3 refills | Status: DC

## 2018-03-06 MED ORDER — FUROSEMIDE 80 MG PO TABS: 80.0000 mg | ORAL_TABLET | Freq: Every morning | ORAL | 3 refills | Status: DC

## 2018-03-06 NOTE — Patient Instructions (Addendum)
Stop Carvedilol  Stop Trandolapril  Start bisoprolol 5 mg (1 tab) daily  Start Entresto 24/26 mg (1 tab), twice a day  Decrease Furosemide 80 mg (1 tab) daily  You have been referred to pulmonary Rehab (they will call you)   Labs drawn today (if we do not call you, then your lab work was stable)   Your physician recommends that you return for lab work in: 10 days  Your physician recommends that you schedule a follow-up appointment in: 3 months with Dr. Aundra Dubin

## 2018-03-07 ENCOUNTER — Telehealth: Payer: Self-pay

## 2018-03-07 NOTE — Telephone Encounter (Signed)
His labs are showing the expected improvement in his thyroid hormone levels.  He does not need adjustment on his thyroid hormone at this time.  He still needs repeat labs before his next visit in July.

## 2018-03-07 NOTE — Progress Notes (Signed)
PCP: Dr. Woody Seller EP: Dr. Lovena Le  HF Cardiology: Dr. Aundra Dubin  77 yo referred by Dr. Lovena Le for evaluation of CHF. Patient has a long cardiac history.  He had a prior PCI to the RCA with BMS.  Most recent cath in 2/17 showed nonobstructive CAD, patent RCA stent.  In 2009, echo showed EF 20-25%.  He went on to have ICD placed and later, CRT-D when he developed complete heart block requiring CRT upgrade. Echo in 1/17 showed EF improved to 55-60%. Echo in 5/19 showed EF 40-45%, moderate LVH, mild-moderate AI, normal RV.    Patient has been seeing pulmonary.  He does not appear to have been very compliant with inhalers.  He was started on Duonebs by Dr. Lenna Gilford but stopped it because it was "messing up my eyes."  He is currently on no regular COPD meds.    He returns for followup of CHF.  He continues to have dyspnea and fatigue walking around his house.  He sleeps in a chair due to cough. Very poor energy.  No chest pain.  Weight is up 4 lbs.  He continues to cough chronically.     Labs (1/19): LDL 80, HDL 42 Labs (4/19): K 3.7, creatinine 1.21  PMH: 1. Aortic insufficiency: Mild to moderate on most recent echo in 1/17.  2. HTN 3. B12 deficiency 4. Atrial tachycardia: S/p ablation in 1/19.  5. Hypothyroidism 6. Hyperlipidemia 7. Prior PE: Not currently on anticoagulation.  8. H/o VT: has CRT-D device.  9. CAD: h/o BMS to RCA.  - LHC (2/17) with mild nonobstructive CAD, patent RCA stent.  10. Complete heart block: Has Medtronic CRT-D device.   11. Chronic systolic CHF: Probably primarily nonischemic CMP as he does not have extensive enough CAD to explain CMP.  - Echo (2009): EF 20-25%.  - Echo (1/17): EF 55-60%, mild LV dilation, mild-moderate AI, normal RV size and systolic function.  - RHC (2/17): mean RA 10, PA 44/10, mean PCWP 12, CI 2.18.  - Echo (5/19): EF 40-45%, moderate LVH, mild-moderate AI, normal RV size and systolic function.  12. COPD: Severe emphysema on prior chest CT.  Smoked in  past.   Social History   Socioeconomic History  . Marital status: Married    Spouse name: Not on file  . Number of children: 2  . Years of education: Not on file  . Highest education level: Not on file  Occupational History  . Occupation: Development worker, community - self employed    Employer: RETIRED  Social Needs  . Financial resource strain: Not on file  . Food insecurity:    Worry: Not on file    Inability: Not on file  . Transportation needs:    Medical: Not on file    Non-medical: Not on file  Tobacco Use  . Smoking status: Former Smoker    Packs/day: 1.00    Years: 43.00    Pack years: 43.00    Types: Cigarettes    Start date: 10/17/1957    Last attempt to quit: 12/13/2000    Years since quitting: 17.2  . Smokeless tobacco: Never Used  . Tobacco comment: started at age 81  Substance and Sexual Activity  . Alcohol use: No    Alcohol/week: 0.0 oz  . Drug use: No  . Sexual activity: Not Currently  Lifestyle  . Physical activity:    Days per week: Not on file    Minutes per session: Not on file  . Stress: Not on file  Relationships  . Social connections:    Talks on phone: Not on file    Gets together: Not on file    Attends religious service: Not on file    Active member of club or organization: Not on file    Attends meetings of clubs or organizations: Not on file    Relationship status: Not on file  . Intimate partner violence:    Fear of current or ex partner: Not on file    Emotionally abused: Not on file    Physically abused: Not on file    Forced sexual activity: Not on file  Other Topics Concern  . Not on file  Social History Narrative   Self employed Development worker, community; 2 daughters.    Family History  Problem Relation Age of Onset  . Diabetes type I Sister        daughter   . Lung cancer Brother        several types of cancer  . Lung cancer Brother        several types of cancer  . Lung cancer Brother   . Clotting disorder Neg Hx        also no repiratory disease   .  Prostate cancer Neg Hx    ROS: All systems reviewed and negative except as per HPI.   Current Outpatient Medications  Medication Sig Dispense Refill  . aspirin EC 81 MG tablet Take 81 mg by mouth at bedtime.    . Calcium Carb-Cholecalciferol (CALCIUM 500 + D3 PO) Take by mouth.    . esomeprazole (NEXIUM) 20 MG capsule Take 1 capsule (20 mg total) by mouth daily at 12 noon. 30 capsule 0  . fluticasone furoate-vilanterol (BREO ELLIPTA) 100-25 MCG/INH AEPB Inhale 1 puff into the lungs daily. 60 each 11  . furosemide (LASIX) 80 MG tablet Take 1 tablet (80 mg total) by mouth every morning. 30 tablet 3  . ipratropium-albuterol (DUONEB) 0.5-2.5 (3) MG/3ML SOLN Take 3 mLs by nebulization 4 (four) times daily. 120 mL 6  . levothyroxine (SYNTHROID, LEVOTHROID) 125 MCG tablet Take 1 tablet (125 mcg total) by mouth daily before breakfast. 30 tablet 3  . Multiple Vitamins-Minerals (MULTIVITAMIN WITH MINERALS) tablet Take 1 tablet by mouth daily.    . niacin 500 MG tablet Take 500 mg by mouth at bedtime.     . pantoprazole (PROTONIX) 40 MG tablet Take 1 tablet (40 mg total) by mouth daily. Take 30 min before bedtime 90 tablet 2  . Polyethyl Glycol-Propyl Glycol (LUBRICANT EYE DROPS) 0.4-0.3 % SOLN Place 1-2 drops into both eyes 2 (two) times daily as needed (for dry/irritated eyes.).    Marland Kitchen potassium chloride SA (K-DUR,KLOR-CON) 20 MEQ tablet Take 1 tablet (20 mEq total) by mouth 2 (two) times daily. 180 tablet 3  . simvastatin (ZOCOR) 20 MG tablet Take 1 tablet (20 mg total) by mouth at bedtime. 90 tablet 3  . VENTOLIN HFA 108 (90 Base) MCG/ACT inhaler INHALE TWO PUFFS BY MOUTH EVERY 6 HOURS AS NEEDED FOR WHEEZING OR  SHORTNESS  OF  BREATH 18 each 3  . bisoprolol (ZEBETA) 5 MG tablet Take 1 tablet (5 mg total) by mouth daily. 30 tablet 3  . sacubitril-valsartan (ENTRESTO) 24-26 MG Take 1 tablet by mouth 2 (two) times daily. 60 tablet 3   No current facility-administered medications for this encounter.     BP (!) 141/69   Pulse 63   Wt 130 lb (59 kg)   SpO2 95%   BMI  19.20 kg/m  General: NAD Neck: No JVD, no thyromegaly or thyroid nodule.  Lungs: Distant breath sounds. CV: Nondisplaced PMI.  Heart regular S1/S2, no S3/S4, no murmur.  No peripheral edema.  No carotid bruit.  Normal pedal pulses.  Abdomen: Soft, nontender, no hepatosplenomegaly, no distention.  Skin: Intact without lesions or rashes.  Neurologic: Alert and oriented x 3.  Psych: Normal affect. Extremities: No clubbing or cyanosis.  HEENT: Normal.   Assessment/Plan: 1. Chronic systolic CHF: Initially, EF low in 20-25% range in 2009.  However, over time, EF improved to 55-60% in 1/17.  Most recent echo in 5/19 with EF 40-45%.  Suspect primarily nonischemic cardiomyopathy as degree of coronary disease does not explain his initial low EF.  Patient has a Medtronic CRT-D device.  NYHA class III symptoms.  As at last appointment, he does not appear volume overloaded.  I am concerned that his symptoms are due mainly to his COPD.  - Stop Coreg, start bisoprolol 5 mg daily as it is more beta-1 selective in the setting of severe COPD, may help breathing some.  - Stop trandolapril and after 36 hrs start Entresto 24/26 bid.  Decrease Lasix to 80 mg daily.   - BMET today and again in 10 days.  2. COPD: His COPD appears quite severe.  He has had a hard time taking inhalers and recently stopped Duonebs.  Has been difficult to treat him given his side effects with COPD meds.   - I will refer him for pulmonary rehab.  3. CAD: Cath in 2017 with nonobstructive disease, patent RCA stent.   - Continue statin, ASA 81.  4. Aortic insufficiency: Mild to moderate on last echo.  5. H/o atrial tachycardia: S/p ablation by Dr. Lovena Le, no palpitations.  6. Complete heart block: He has MDT CRT-D device.  7. Pulmonary hypertension: Noted on 2017 RHC, mild and likely group 3 from COPD.   Followup in 3 months.   Loralie Champagne 03/07/2018

## 2018-03-07 NOTE — Telephone Encounter (Signed)
pts wife is requesting lab results. He is having "thyroid symptoms" and theywent ahead and had his labs done.

## 2018-03-08 ENCOUNTER — Other Ambulatory Visit: Payer: Self-pay | Admitting: "Endocrinology

## 2018-03-08 ENCOUNTER — Telehealth (HOSPITAL_COMMUNITY): Payer: Self-pay

## 2018-03-08 DIAGNOSIS — E039 Hypothyroidism, unspecified: Secondary | ICD-10-CM

## 2018-03-08 NOTE — Telephone Encounter (Signed)
Pt's wife notified.

## 2018-03-08 NOTE — Telephone Encounter (Signed)
Patient's wife calling to report bisoprolol on back order at their pharmacy. Advised to contact pharmacy to request they find an alternate pharmacy with it in stock and transfer Rx. [facility] closest to their house has 30 day supply in stock with ability to order more. Patient and wife aware and agreeable.  Renee Pain, RN

## 2018-03-13 ENCOUNTER — Other Ambulatory Visit: Payer: Self-pay

## 2018-03-13 ENCOUNTER — Encounter: Payer: Self-pay | Admitting: Internal Medicine

## 2018-03-13 MED ORDER — POTASSIUM CHLORIDE CRYS ER 20 MEQ PO TBCR: 20.0000 meq | EXTENDED_RELEASE_TABLET | Freq: Two times a day (BID) | ORAL | 11 refills | Status: DC

## 2018-03-13 MED ORDER — POTASSIUM CHLORIDE CRYS ER 20 MEQ PO TBCR: 20.0000 meq | EXTENDED_RELEASE_TABLET | Freq: Two times a day (BID) | ORAL | 3 refills | Status: DC

## 2018-03-13 NOTE — Progress Notes (Signed)
Pt requesting potassium be sent in to [facility] in [place] for 30 day supplies.

## 2018-03-14 DIAGNOSIS — J449 Chronic obstructive pulmonary disease, unspecified: Secondary | ICD-10-CM | POA: Diagnosis not present

## 2018-03-14 DIAGNOSIS — J439 Emphysema, unspecified: Secondary | ICD-10-CM | POA: Diagnosis not present

## 2018-03-14 DIAGNOSIS — I5023 Acute on chronic systolic (congestive) heart failure: Secondary | ICD-10-CM | POA: Diagnosis not present

## 2018-03-14 DIAGNOSIS — Z681 Body mass index (BMI) 19 or less, adult: Secondary | ICD-10-CM | POA: Diagnosis not present

## 2018-03-14 DIAGNOSIS — Z299 Encounter for prophylactic measures, unspecified: Secondary | ICD-10-CM | POA: Diagnosis not present

## 2018-03-14 DIAGNOSIS — I10 Essential (primary) hypertension: Secondary | ICD-10-CM | POA: Diagnosis not present

## 2018-03-15 ENCOUNTER — Encounter (HOSPITAL_COMMUNITY): Payer: Medicare Other | Admitting: Cardiology

## 2018-03-16 ENCOUNTER — Other Ambulatory Visit (HOSPITAL_COMMUNITY)
Admission: RE | Admit: 2018-03-16 | Discharge: 2018-03-16 | Disposition: A | Discharge status: Home / Self Care | Payer: Medicare Other | Source: Ambulatory Visit | Attending: Cardiology | Admitting: Cardiology

## 2018-03-16 DIAGNOSIS — I5022 Chronic systolic (congestive) heart failure: Secondary | ICD-10-CM | POA: Diagnosis not present

## 2018-03-16 LAB — BASIC METABOLIC PANEL
Anion gap: 6 (ref 5–15)
BUN: 21 mg/dL — AB (ref 6–20)
CALCIUM: 9.5 mg/dL (ref 8.9–10.3)
CO2: 31 mmol/L (ref 22–32)
Chloride: 104 mmol/L (ref 101–111)
Creatinine, Ser: 0.97 mg/dL (ref 0.61–1.24)
GFR calc Af Amer: >60 mL/min (ref >60)
Glucose, Bld: 63 mg/dL — ABNORMAL LOW (ref 65–99)
Potassium: 4.3 mmol/L (ref 3.5–5.1)
Sodium: 141 mmol/L (ref 135–145)

## 2018-03-19 ENCOUNTER — Telehealth (HOSPITAL_COMMUNITY): Payer: Self-pay | Admitting: *Deleted

## 2018-03-19 DIAGNOSIS — E7800 Pure hypercholesterolemia, unspecified: Secondary | ICD-10-CM | POA: Diagnosis not present

## 2018-03-19 DIAGNOSIS — J449 Chronic obstructive pulmonary disease, unspecified: Secondary | ICD-10-CM | POA: Diagnosis not present

## 2018-03-19 DIAGNOSIS — M159 Polyosteoarthritis, unspecified: Secondary | ICD-10-CM | POA: Diagnosis not present

## 2018-03-19 DIAGNOSIS — I2510 Atherosclerotic heart disease of native coronary artery without angina pectoris: Secondary | ICD-10-CM | POA: Diagnosis not present

## 2018-03-19 NOTE — Telephone Encounter (Signed)
Entresto 24-26 mg PA approved from 03/16/18 through 02/3019.

## 2018-03-26 ENCOUNTER — Telehealth: Payer: Self-pay

## 2018-03-26 ENCOUNTER — Encounter: Payer: Medicare Other | Admitting: *Deleted

## 2018-03-26 NOTE — Telephone Encounter (Signed)
LMOVM reminding pt to send remote transmission.   

## 2018-03-29 ENCOUNTER — Encounter: Payer: Self-pay | Admitting: Cardiology

## 2018-03-29 ENCOUNTER — Ambulatory Visit (INDEPENDENT_AMBULATORY_CARE_PROVIDER_SITE_OTHER): Payer: Medicare Other | Admitting: *Deleted

## 2018-03-29 DIAGNOSIS — I255 Ischemic cardiomyopathy: Secondary | ICD-10-CM

## 2018-03-29 DIAGNOSIS — I5022 Chronic systolic (congestive) heart failure: Secondary | ICD-10-CM

## 2018-03-29 DIAGNOSIS — Z950 Presence of cardiac pacemaker: Secondary | ICD-10-CM

## 2018-03-29 NOTE — Progress Notes (Signed)
Remote pacemaker transmission.   

## 2018-03-29 NOTE — Progress Notes (Signed)
EPIC Encounter for ICM Monitoring  Patient Name: [name] is a 77 y.o. male Date: 03/29/2018 Primary Care Physican: Glenda Chroman, MD Primary Cardiologist:[place] Electrophysiologist: Lovena Le Dry Weight: 125lbs  Bi-V Pacing:96.4 %       Call to wife.  Heart Failure questions reviewed, pt asymptomatic. She said he is feeling better since starting Entresto.   Thoracic impedance normal.  Prescribed dosage: Furosemide20 mg 4 tablets (80 mg total) twice a day.  Labs: 03/16/2018 Creatinine 0.97, BUN 21, Potassium 4.3, Sodium 141, EGFR >60 02/06/2018 Creatinine 1.21, BUN 25, Potassium 3.7, Sodium 139, EGFR 57->60 01/17/2018 Creatinine1.12, BUN18, Potassium3.3, Sodium141, EGFR>60  10/31/2017 Creatinine1.19, BUN26, Potassium4.0, [id number], [id number] 10/18/2017 Creatinine 0.98, BUN 18, Potassium 4.2, Sodium 144, EGFR 75-87 09/15/2017 Creatinine 0.99, BUN 18, Potassium 4.1, Sodium 145, EGFR 74-86 06/12/2017 Creatinine 1.08, BUN 23, Potassium 4.1, Sodium 141, EGFR 67-77 02/22/2017 Creatinine 1.02, BUN 19, Potassium 4.0, Sodium 147, EGFR 72-83 10/25/2016 Creatinine 1.12, BUN 16, Potassium 4.4, Sodium 142, EGFR 63-73 A complete set of results can be found in Results Review.  Recommendations: No changes.    Encouraged to call for fluid symptoms.  Follow-up plan: ICM clinic phone appointment on 04/30/2018.  Office appointment scheduled 06/11/2018 with Dr. Aundra Dubin.  Copy of ICM check sent to Dr. Lovena Le.   3 month ICM trend: 03/28/2018    1 Year ICM trend:       Rosalene Billings, RN 03/29/2018 11:24 AM

## 2018-04-03 LAB — CUP PACEART REMOTE DEVICE CHECK
Battery Remaining Longevity: 39 mo
Battery Voltage: 2.99 V
Brady Statistic AP VP Percent: 79.34 %
Brady Statistic AS VP Percent: 20.61 %
Date Time Interrogation Session: 20190612190535
Implantable Lead Implant Date: 20050810
Implantable Lead Implant Date: 20071015
Implantable Lead Location: 753859
Implantable Lead Location: 753860
Implantable Lead Model: 4194
Implantable Lead Model: 6949
Implantable Pulse Generator Implant Date: 20170718
Lead Channel Impedance Value: 228 Ohm
Lead Channel Impedance Value: 494 Ohm
Lead Channel Impedance Value: 513 Ohm
Lead Channel Impedance Value: 532 Ohm
Lead Channel Impedance Value: 627 Ohm
Lead Channel Impedance Value: 684 Ohm
Lead Channel Pacing Threshold Amplitude: 1.25 V
Lead Channel Pacing Threshold Pulse Width: 0.4 ms
Lead Channel Sensing Intrinsic Amplitude: 9 mV
Lead Channel Sensing Intrinsic Amplitude: 9 mV
Lead Channel Setting Pacing Amplitude: 3 V
Lead Channel Setting Pacing Amplitude: 3.75 V
Lead Channel Setting Pacing Pulse Width: 1 ms
MDC IDC LEAD IMPLANT DT: 20050810
MDC IDC LEAD LOCATION: 753858
MDC IDC MSMT LEADCHNL LV IMPEDANCE VALUE: 399 Ohm
MDC IDC MSMT LEADCHNL LV IMPEDANCE VALUE: 589 Ohm
MDC IDC MSMT LEADCHNL RA IMPEDANCE VALUE: 475 Ohm
MDC IDC MSMT LEADCHNL RA PACING THRESHOLD AMPLITUDE: 0.5 V
MDC IDC MSMT LEADCHNL RA SENSING INTR AMPL: 4.875 mV
MDC IDC MSMT LEADCHNL RA SENSING INTR AMPL: 4.875 mV
MDC IDC MSMT LEADCHNL RV PACING THRESHOLD PULSEWIDTH: 0.4 ms
MDC IDC SET LEADCHNL RA PACING AMPLITUDE: 1.5 V
MDC IDC SET LEADCHNL RV PACING PULSEWIDTH: 0.6 ms
MDC IDC SET LEADCHNL RV SENSING SENSITIVITY: 2.8 mV
MDC IDC STAT BRADY AP VS PERCENT: 0.03 %
MDC IDC STAT BRADY AS VS PERCENT: 0.02 %
MDC IDC STAT BRADY RA PERCENT PACED: 78.48 %
MDC IDC STAT BRADY RV PERCENT PACED: 99.17 %

## 2018-04-04 ENCOUNTER — Ambulatory Visit: Payer: Medicare Other | Admitting: Pulmonary Disease

## 2018-04-16 DIAGNOSIS — I10 Essential (primary) hypertension: Secondary | ICD-10-CM | POA: Diagnosis not present

## 2018-04-16 DIAGNOSIS — Z6820 Body mass index (BMI) 20.0-20.9, adult: Secondary | ICD-10-CM | POA: Diagnosis not present

## 2018-04-16 DIAGNOSIS — Z299 Encounter for prophylactic measures, unspecified: Secondary | ICD-10-CM | POA: Diagnosis not present

## 2018-04-16 DIAGNOSIS — M109 Gout, unspecified: Secondary | ICD-10-CM | POA: Diagnosis not present

## 2018-04-16 DIAGNOSIS — J441 Chronic obstructive pulmonary disease with (acute) exacerbation: Secondary | ICD-10-CM | POA: Diagnosis not present

## 2018-04-16 DIAGNOSIS — L03115 Cellulitis of right lower limb: Secondary | ICD-10-CM | POA: Diagnosis not present

## 2018-04-18 DIAGNOSIS — E039 Hypothyroidism, unspecified: Secondary | ICD-10-CM | POA: Diagnosis not present

## 2018-04-18 LAB — T4, FREE: Free T4: 1.7 ng/dL (ref 0.8–1.8)

## 2018-04-18 LAB — TSH: TSH: 0.67 mIU/L (ref 0.40–4.50)

## 2018-04-20 DIAGNOSIS — M159 Polyosteoarthritis, unspecified: Secondary | ICD-10-CM | POA: Diagnosis not present

## 2018-04-20 DIAGNOSIS — I2510 Atherosclerotic heart disease of native coronary artery without angina pectoris: Secondary | ICD-10-CM | POA: Diagnosis not present

## 2018-04-20 DIAGNOSIS — E7800 Pure hypercholesterolemia, unspecified: Secondary | ICD-10-CM | POA: Diagnosis not present

## 2018-04-20 DIAGNOSIS — J449 Chronic obstructive pulmonary disease, unspecified: Secondary | ICD-10-CM | POA: Diagnosis not present

## 2018-04-26 ENCOUNTER — Ambulatory Visit (INDEPENDENT_AMBULATORY_CARE_PROVIDER_SITE_OTHER): Payer: Medicare Other | Admitting: "Endocrinology

## 2018-04-26 ENCOUNTER — Encounter: Payer: Self-pay | Admitting: "Endocrinology

## 2018-04-26 VITALS — BP 101/61 | HR 65 | Ht 69.0 in | Wt 130.0 lb

## 2018-04-26 DIAGNOSIS — I255 Ischemic cardiomyopathy: Secondary | ICD-10-CM | POA: Diagnosis not present

## 2018-04-26 DIAGNOSIS — E890 Postprocedural hypothyroidism: Secondary | ICD-10-CM

## 2018-04-26 MED ORDER — LEVOTHYROXINE SODIUM 112 MCG PO TABS: 112.0000 ug | ORAL_TABLET | Freq: Every day | ORAL | 2 refills | Status: DC

## 2018-04-26 NOTE — Progress Notes (Signed)
Endocrinology follow-up note                                            04/26/2018, 10:19 AM   Subjective:    Patient ID: [name], male    DOB: 1941-02-03, PCP Glenda Chroman, MD   Past Medical History:  Diagnosis Date  . AICD (automatic cardioverter/defibrillator) present   . Aortic insufficiency     moderate aortic ins moderate/asymptomatic/normal LV cavity size.   . Carotid artery disease ([place])     less than 50% stenosis bilaterally  . Carotid artery disease ([place])   . Chronic systolic CHF (congestive heart failure) ([place])   . Coronary artery disease   . Drug-induced gynecomastia     secondary to spironolactone  . Dyslipidemia   . Hypertension   . ICD (implantable cardiac defibrillator), biventricular, in situ     Medtronic model  [id number]  . Ischemic cardiomyopathy     status post non-ST elevation microinfarction stent to the right coronary artery is a   . LV dysfunction     NYHA class I/prior ejection fraction 20-25% and an ejection fraction 7 2009 50-55%, ejection fraction 60% bedside echocardiogram July 2012   . PONV (postoperative nausea and vomiting)   . Pulmonary embolism (H[facility])    Prior history of pulmonary embolism off Coumadin.  . Ventricular tachycardia ([place])    Inducible ventricular polymorphic tachycardia status post ICD followed by upgrade with CRT-D 2007, battery change at 01/31/2011   Past Surgical History:  Procedure Laterality Date  . ABLATION OF DYSRHYTHMIC FOCUS  10/20/2017   atrial tach  . ATRIAL TACH ABLATION N/A 10/20/2017   Procedure: ATRIAL TACH ABLATION;  Surgeon: Evans Lance, MD;  Location: [facility];  Service: Cardiovascular;  Laterality: N/A;  . CARDIAC CATHETERIZATION    . CARDIAC CATHETERIZATION N/A 12/10/2015   Procedure: Right/Left Heart Cath and Coronary Angiography;  Surgeon: Peter M Martinique, MD;  Location: [facility];  Service: Cardiovascular;  Laterality: N/A;  . CARDIAC DEFIBRILLATOR PLACEMENT     medtronic, remote - yes  . CATARACT EXTRACTION W/PHACO Left 08/25/2014   Procedure: CATARACT EXTRACTION PHACO AND INTRAOCULAR LENS PLACEMENT (IOC);  Surgeon: Tonny Branch, MD;  Location: [facility];  Service: Ophthalmology;  Laterality: Left;  CDE 5.79  . CATARACT EXTRACTION W/PHACO Right 09/18/2014   Procedure: CATARACT EXTRACTION PHACO AND INTRAOCULAR LENS PLACEMENT RIGHT EYE;  Surgeon: Tonny Branch, MD;  Location: [facility];  Service: Ophthalmology;  Laterality: Right;  CDE:3.35  . CORONARY STENT PLACEMENT    . EP IMPLANTABLE DEVICE N/A 05/03/2016   Procedure: BIV PPM Generator Changeout;  Surgeon: Evans Lance, MD;  Location: [facility];  Service: Cardiovascular;  Laterality: N/A;  . INGUINAL HERNIA REPAIR    . left breast biopsy    . RETROPUBIC PROSTATECTOMY    . THORACOTOMY     left  . THYROID SURGERY    . THYROIDECTOMY     Social History   Socioeconomic History  . Marital status: Married    Spouse name: Not on file  . Number of children: 2  . Years of education: Not on file  . Highest education level: Not on file  Occupational History  . Occupation: Development worker, community - self employed    Employer: RETIRED  Social Needs  . Financial resource strain:  Not on file  . Food insecurity:    Worry: Not on file    Inability: Not on file  . Transportation needs:    Medical: Not on file    Non-medical: Not on file  Tobacco Use  . Smoking status: Former Smoker    Packs/day: 1.00    Years: 43.00    Pack years: 43.00    Types: Cigarettes    Start date: 10/17/1957    Last attempt to quit: 12/13/2000    Years since quitting: 17.3  . Smokeless tobacco: Never Used  . Tobacco comment: started at age 66  Substance and Sexual Activity  . Alcohol use: No    Alcohol/week: 0.0 oz  . Drug use: No  . Sexual activity: Not Currently  Lifestyle  . Physical activity:    Days per week: Not on file    Minutes per session: Not on file  . Stress: Not on file  Relationships  . Social connections:    Talks  on phone: Not on file    Gets together: Not on file    Attends religious service: Not on file    Active member of club or organization: Not on file    Attends meetings of clubs or organizations: Not on file    Relationship status: Not on file  Other Topics Concern  . Not on file  Social History Narrative   Self employed Development worker, community; 2 daughters.    Outpatient Encounter Medications as of 04/26/2018  Medication Sig  . aspirin EC 81 MG tablet Take 81 mg by mouth at bedtime.  . bisoprolol (ZEBETA) 5 MG tablet Take 1 tablet (5 mg total) by mouth daily.  . Calcium Carb-Cholecalciferol (CALCIUM 500 + D3 PO) Take by mouth.  . esomeprazole (NEXIUM) 20 MG capsule Take 1 capsule (20 mg total) by mouth daily at 12 noon.  . fluticasone furoate-vilanterol (BREO ELLIPTA) 100-25 MCG/INH AEPB Inhale 1 puff into the lungs daily.  . furosemide (LASIX) 80 MG tablet Take 1 tablet (80 mg total) by mouth every morning.  Marland Kitchen ipratropium-albuterol (DUONEB) 0.5-2.5 (3) MG/3ML SOLN Take 3 mLs by nebulization 4 (four) times daily.  Marland Kitchen levothyroxine (SYNTHROID, LEVOTHROID) 112 MCG tablet Take 1 tablet (112 mcg total) by mouth daily before breakfast.  . Multiple Vitamins-Minerals (MULTIVITAMIN WITH MINERALS) tablet Take 1 tablet by mouth daily.  . niacin 500 MG tablet Take 500 mg by mouth at bedtime.   . pantoprazole (PROTONIX) 40 MG tablet Take 1 tablet (40 mg total) by mouth daily. Take 30 min before bedtime  . Polyethyl Glycol-Propyl Glycol (LUBRICANT EYE DROPS) 0.4-0.3 % SOLN Place 1-2 drops into both eyes 2 (two) times daily as needed (for dry/irritated eyes.).  Marland Kitchen potassium chloride SA (K-DUR,KLOR-CON) 20 MEQ tablet Take 1 tablet (20 mEq total) by mouth 2 (two) times daily.  . sacubitril-valsartan (ENTRESTO) 24-26 MG Take 1 tablet by mouth 2 (two) times daily.  . simvastatin (ZOCOR) 20 MG tablet Take 1 tablet (20 mg total) by mouth at bedtime.  . VENTOLIN HFA 108 (90 Base) MCG/ACT inhaler INHALE TWO PUFFS BY MOUTH EVERY  6 HOURS AS NEEDED FOR WHEEZING OR  SHORTNESS  OF  BREATH  . [DISCONTINUED] levothyroxine (SYNTHROID, LEVOTHROID) 125 MCG tablet Take 1 tablet (125 mcg total) by mouth daily before breakfast.   No facility-administered encounter medications on file as of 04/26/2018.    ALLERGIES: Allergies  Allergen Reactions  . Spironolactone Other (See Comments)    gynecomastia    VACCINATION STATUS: Immunization  History  Administered Date(s) Administered  . Influenza, High Dose Seasonal PF 08/31/2014, 10/01/2015, 08/29/2016  . Influenza-Unspecified 07/20/2017  . Pneumococcal Polysaccharide-23 10/17/2013    HPI [name] is 77 y.o. male who presents today with a medical history as above. he is returning with new thyroid function tests and surveillance thyroid/neck ultrasound.  He was seen in consultation for postsurgical hypothyroidism related to his history of thyroid cancer in 2005 requested by Glenda Chroman, MD.  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 -His surveillance ultrasound is significant for surgically absent thyroid and negative for any thyroid mass or tumor recurrence.  -He is currently on levothyroxine 125 mcg p.o. every morning, reports compliance.  -He is accompanied by his wife who helps him with history.  He complains of cold intolerance, recent weight gain, fatigue, and sluggishness.  -He denies any heat intolerance, palpitations, tremors. -He is a former smoker.  He denies family history of thyroid dysfunction nor thyroid cancer.   Review of Systems  Constitutional: + Lost 8 pounds since February 2019,  + fatigue,  + subjective hypothermia Eyes: no blurry vision, no  xerophthalmia ENT: no sore throat, no nodules palpated in throat, no dysphagia/odynophagia, no hoarseness Cardiovascular: no chest pain, palpitations, shortness of breath.    Respiratory: no cough, no SOB Gastrointestinal: no Nausea/Vomiting/Diarhhea Musculoskeletal: no muscle/joint aches Skin: no rashes Neurological: No tremors, no tingling  Psychiatric: no depression, no anxiety  Objective:    BP 101/61   Pulse 65   Ht 5\' 9"  (1.753 m)   Wt 130 lb (59 kg)   BMI 19.20 kg/m   Wt Readings from Last 3 Encounters:  04/26/18 130 lb (59 kg)  03/06/18 130 lb (59 kg)  02/21/18 126 lb 3.2 oz (57.2 kg)    Physical Exam  Constitutional:  + Light build,  not in acute distress, normal state of mind, alert and oriented x3. Eyes: PERRLA, EOMI, no exophthalmos ENT: moist mucous membranes, + postsurgical scar over anterior lower neck , no cervical lymphadenopathy  Musculoskeletal: no gross deformities, strength intact in all four extremities Skin: moist, warm, no rashes Neurological: no tremor with outstretched hands, Deep tendon reflexes normal in all four extremities, no evidence of ataxia.  CMP ( most recent) CMP     Component Value Date/Time   NA 141 03/16/2018 0854   NA 144 10/18/2017 1300   K 4.3 03/16/2018 0854   CL 104 03/16/2018 0854   CO2 31 03/16/2018 0854   GLUCOSE 63 (L) 03/16/2018 0854   BUN 21 (H) 03/16/2018 0854   BUN 18 10/18/2017 1300   CREATININE 0.97 03/16/2018 0854   CREATININE 0.98 07/28/2016 1211   CALCIUM 9.5 03/16/2018 0854   PROT 7.1 10/08/2016 1348   ALBUMIN 4.3 10/08/2016 1348   AST 23 10/08/2016 1348   ALT 9 (L) 10/08/2016 1348   ALKPHOS 64 10/08/2016 1348   BILITOT 1.4 (H) 10/08/2016 1348   GFRNONAA >60 03/16/2018 0854   GFRAA >60 03/16/2018 0854     Thyroid ultrasound is significant for surgically absent thyroid.  Results for TRAFTON, ROKER (MRN [id number]) as of 04/26/2018 10:14  Ref. Range 04/18/2018 13:54  TSH Latest Ref Range: 0.40 -  4.50 mIU/L 0.67  T4,Free(Direct) Latest Ref Range: 0.8 - 1.8 ng/dL 1.7    Assessment & Plan:   1. History of thyroid cancer -I have reviewed his history, Pt1Nx (bilateral, multicentric, follicular variant papillary thyroid cancer-2 cm in the left lobe and 0.6 cm in the right lobe diagnosed and treated in 2005).  It appears that he did have complete initial treatment with total thyroidectomy and thyroid remnant ablation.  He did have 4 subsequent whole-body scans which are negative for tumor recurrence and distant metastasis last one in 2007. -His surveillance thyroid/neck ultrasound is significant for surgically absent thyroid with no evidence of thyroid mass or tumor recurrence.  No intervention required at this time.  2. Postsurgical hypothyroidism -He will benefit from de-escalation of thyroid hormone dose at this time.  I discussed and lowered his dose to 112 mcg p.o. every morning.   - We discussed about correct intake of levothyroxine, at fasting, with water, separated by at least 30 minutes from breakfast, and separated by more than 4 hours from calcium, iron, multivitamins, acid reflux medications (PPIs). -Patient is made aware of the fact that thyroid hormone replacement is needed for life, dose to be adjusted by periodic monitoring of thyroid function tests.   Follow up plan: Return in about 6 months (around 10/27/2018) for follow up with pre-visit labs.   Glade Lloyd, MD [facility] [address] [place], [place] [address] Phone: [phone]  Fax: [phone]     04/26/2018, 10:19 AM  This note was partially dictated with voice recognition software. Similar sounding words can be transcribed inadequately or may not  be corrected upon review.

## 2018-04-30 ENCOUNTER — Telehealth: Payer: Self-pay

## 2018-04-30 NOTE — Telephone Encounter (Signed)
Attempted to confirm remote transmission with pt. No answer and was unable to leave a message.   

## 2018-05-04 NOTE — Progress Notes (Signed)
No ICM remote transmission received for 04/30/2018 and next ICM transmission scheduled for 05/24/2018.    

## 2018-05-07 ENCOUNTER — Ambulatory Visit: Payer: Medicare Other | Admitting: Diagnostic Neuroimaging

## 2018-05-23 DIAGNOSIS — H53462 Homonymous bilateral field defects, left side: Secondary | ICD-10-CM | POA: Diagnosis not present

## 2018-05-23 DIAGNOSIS — Z961 Presence of intraocular lens: Secondary | ICD-10-CM | POA: Diagnosis not present

## 2018-05-24 ENCOUNTER — Ambulatory Visit (INDEPENDENT_AMBULATORY_CARE_PROVIDER_SITE_OTHER): Payer: Medicare Other

## 2018-05-24 ENCOUNTER — Telehealth: Payer: Self-pay

## 2018-05-24 DIAGNOSIS — I5022 Chronic systolic (congestive) heart failure: Secondary | ICD-10-CM

## 2018-05-24 DIAGNOSIS — Z950 Presence of cardiac pacemaker: Secondary | ICD-10-CM | POA: Diagnosis not present

## 2018-05-24 NOTE — Telephone Encounter (Signed)
Wife called back and said patient is at home now and will send remote transmission today.

## 2018-05-24 NOTE — Telephone Encounter (Signed)
Spoke with wife and patient is out of town.  Rescheduled ICM remote transmission for 05/31/2018.

## 2018-05-24 NOTE — Progress Notes (Signed)
EPIC Encounter for ICM Monitoring  Patient Name: [name] is a 77 y.o. male Date: 05/24/2018 Primary Care Physican: Glenda Chroman, MD Primary Cardiologist:[place] Electrophysiologist: Lovena Le Dry Weight: 128lbs  Bi-V Pacing:98.5 %      Spoke with wife.  Heart Failure questions reviewed, pt asymptomatic.   Thoracic impedance normal.  Prescribed dosage: Furosemide20 mg 4 tablets (80 mg total) daily.  Labs: 03/16/2018 Creatinine 0.97, BUN 21, Potassium 4.3, Sodium 141, EGFR >60 02/06/2018 Creatinine 1.21, BUN 25, Potassium 3.7, Sodium 139, EGFR 57->60 01/17/2018 Creatinine1.12, BUN18, Potassium3.3, Sodium141, EGFR>60  10/31/2017 Creatinine1.19, BUN26, Potassium4.0, [id number], [id number] 10/18/2017 Creatinine 0.98, BUN 18, Potassium 4.2, Sodium 144, EGFR 75-87 09/15/2017 Creatinine 0.99, BUN 18, Potassium 4.1, Sodium 145, EGFR 74-86 06/12/2017 Creatinine 1.08, BUN 23, Potassium 4.1, Sodium 141, EGFR 67-77 02/22/2017 Creatinine 1.02, BUN 19, Potassium 4.0, Sodium 147, EGFR 72-83 10/25/2016 Creatinine 1.12, BUN 16, Potassium 4.4, Sodium 142, EGFR 63-73 A complete set of results can be found in Results Review.  Recommendations: No changes.   Encouraged to call for fluid symptoms.  Follow-up plan: ICM clinic phone appointment on 06/28/2018.    Copy of ICM check sent to Dr. Lovena Le.   3 month ICM trend: 05/24/2018    1 Year ICM trend:       Rosalene Billings, RN 05/24/2018 2:56 PM

## 2018-05-25 DIAGNOSIS — I2510 Atherosclerotic heart disease of native coronary artery without angina pectoris: Secondary | ICD-10-CM | POA: Diagnosis not present

## 2018-05-25 DIAGNOSIS — E7800 Pure hypercholesterolemia, unspecified: Secondary | ICD-10-CM | POA: Diagnosis not present

## 2018-05-25 DIAGNOSIS — J449 Chronic obstructive pulmonary disease, unspecified: Secondary | ICD-10-CM | POA: Diagnosis not present

## 2018-05-25 DIAGNOSIS — M159 Polyosteoarthritis, unspecified: Secondary | ICD-10-CM | POA: Diagnosis not present

## 2018-05-26 ENCOUNTER — Other Ambulatory Visit (HOSPITAL_COMMUNITY): Payer: Self-pay | Admitting: Cardiology

## 2018-06-05 ENCOUNTER — Telehealth (HOSPITAL_COMMUNITY): Payer: Self-pay | Admitting: *Deleted

## 2018-06-05 NOTE — Telephone Encounter (Signed)
Patient's wife called saying patient's BP is sometimes low after taking entresto.  Lowest being 89/52 but averages in the low 616'O Systolic.  BP prior to taking meds runs 126/65.  Patient has been feeling nervous/disoriented per wife for a few months now and asking if its related to his low BP.  Patient doesn;'t feel any different with BP changes but feels disoriented all the time.  I explained I didn't feel that this was related to his BP being low. Advised her to record all readings at the same time every day and bring to his follow up appointment with Dr. Aundra Dubin next Monday to review.  No further questions.

## 2018-06-08 DIAGNOSIS — Z6820 Body mass index (BMI) 20.0-20.9, adult: Secondary | ICD-10-CM | POA: Diagnosis not present

## 2018-06-08 DIAGNOSIS — Z299 Encounter for prophylactic measures, unspecified: Secondary | ICD-10-CM | POA: Diagnosis not present

## 2018-06-08 DIAGNOSIS — I255 Ischemic cardiomyopathy: Secondary | ICD-10-CM | POA: Diagnosis not present

## 2018-06-08 DIAGNOSIS — J449 Chronic obstructive pulmonary disease, unspecified: Secondary | ICD-10-CM | POA: Diagnosis not present

## 2018-06-08 DIAGNOSIS — J209 Acute bronchitis, unspecified: Secondary | ICD-10-CM | POA: Diagnosis not present

## 2018-06-08 DIAGNOSIS — J440 Chronic obstructive pulmonary disease with acute lower respiratory infection: Secondary | ICD-10-CM | POA: Diagnosis not present

## 2018-06-11 ENCOUNTER — Encounter (HOSPITAL_COMMUNITY): Payer: Self-pay | Admitting: Cardiology

## 2018-06-11 ENCOUNTER — Ambulatory Visit (HOSPITAL_COMMUNITY)
Admission: RE | Admit: 2018-06-11 | Discharge: 2018-06-11 | Disposition: A | Discharge status: Home / Self Care | Payer: Medicare Other | Source: Ambulatory Visit | Attending: Cardiology | Admitting: Cardiology

## 2018-06-11 VITALS — BP 108/62 | HR 66 | Wt 131.2 lb

## 2018-06-11 DIAGNOSIS — Z955 Presence of coronary angioplasty implant and graft: Secondary | ICD-10-CM | POA: Diagnosis not present

## 2018-06-11 DIAGNOSIS — I5022 Chronic systolic (congestive) heart failure: Secondary | ICD-10-CM | POA: Insufficient documentation

## 2018-06-11 DIAGNOSIS — J449 Chronic obstructive pulmonary disease, unspecified: Secondary | ICD-10-CM | POA: Diagnosis not present

## 2018-06-11 DIAGNOSIS — I2510 Atherosclerotic heart disease of native coronary artery without angina pectoris: Secondary | ICD-10-CM | POA: Insufficient documentation

## 2018-06-11 DIAGNOSIS — G20 Parkinson's disease: Secondary | ICD-10-CM | POA: Diagnosis not present

## 2018-06-11 DIAGNOSIS — Z7982 Long term (current) use of aspirin: Secondary | ICD-10-CM | POA: Diagnosis not present

## 2018-06-11 DIAGNOSIS — I110 Hypertensive heart disease with heart failure: Secondary | ICD-10-CM | POA: Insufficient documentation

## 2018-06-11 DIAGNOSIS — I351 Nonrheumatic aortic (valve) insufficiency: Secondary | ICD-10-CM | POA: Diagnosis not present

## 2018-06-11 DIAGNOSIS — E039 Hypothyroidism, unspecified: Secondary | ICD-10-CM | POA: Diagnosis not present

## 2018-06-11 DIAGNOSIS — Z79899 Other long term (current) drug therapy: Secondary | ICD-10-CM | POA: Insufficient documentation

## 2018-06-11 DIAGNOSIS — E785 Hyperlipidemia, unspecified: Secondary | ICD-10-CM | POA: Insufficient documentation

## 2018-06-11 DIAGNOSIS — Z79890 Hormone replacement therapy (postmenopausal): Secondary | ICD-10-CM | POA: Diagnosis not present

## 2018-06-11 DIAGNOSIS — N62 Hypertrophy of breast: Secondary | ICD-10-CM | POA: Diagnosis not present

## 2018-06-11 DIAGNOSIS — Z87891 Personal history of nicotine dependence: Secondary | ICD-10-CM | POA: Insufficient documentation

## 2018-06-11 DIAGNOSIS — I471 Supraventricular tachycardia: Secondary | ICD-10-CM | POA: Insufficient documentation

## 2018-06-11 DIAGNOSIS — I272 Pulmonary hypertension, unspecified: Secondary | ICD-10-CM | POA: Diagnosis not present

## 2018-06-11 LAB — BASIC METABOLIC PANEL
Anion gap: 8 (ref 5–15)
BUN: 20 mg/dL (ref 8–23)
CALCIUM: 9.4 mg/dL (ref 8.9–10.3)
CHLORIDE: 103 mmol/L (ref 98–111)
CO2: 34 mmol/L — AB (ref 22–32)
CREATININE: 1 mg/dL (ref 0.61–1.24)
GFR calc non Af Amer: >60 mL/min (ref >60)
Glucose, Bld: 93 mg/dL (ref 70–99)
Potassium: 4 mmol/L (ref 3.5–5.1)
Sodium: 145 mmol/L (ref 135–145)

## 2018-06-11 MED ORDER — EPLERENONE 25 MG PO TABS: 25.0000 mg | ORAL_TABLET | Freq: Every day | ORAL | 3 refills | Status: DC

## 2018-06-11 MED ORDER — POTASSIUM CHLORIDE CRYS ER 20 MEQ PO TBCR: 20.0000 meq | EXTENDED_RELEASE_TABLET | Freq: Every day | ORAL | 3 refills | Status: DC

## 2018-06-11 NOTE — Patient Instructions (Signed)
Labs today (will call for abnormal results, otherwise no news is good news)  START taking eplerenone 25 mg Once Daily  DECREASE Potassium to 20 mEq Once Daily  Labs in 10 days (bmet)   Follow up in 3 Months

## 2018-06-12 NOTE — Progress Notes (Signed)
PCP: Dr. Woody Seller EP: Dr. Lovena Le  HF Cardiology: Dr. Aundra Dubin  77 y.o. referred by Dr. Lovena Le for evaluation of CHF. Patient has a long cardiac history.  He had a prior PCI to the RCA with BMS.  Most recent cath in 2/17 showed nonobstructive CAD, patent RCA stent.  In 2009, echo showed EF 20-25%.  He went on to have ICD placed and later, CRT-D when he developed complete heart block requiring CRT upgrade. Echo in 1/17 showed EF improved to 55-60%. Echo in 5/19 showed EF 40-45%, moderate LVH, mild-moderate AI, normal RV.    Patient has been seeing pulmonary.  He does not appear to have been very compliant with inhalers.  He was started on Duonebs by Dr. Lenna Gilford but stopped it because it was "messing up my eyes."  He is currently on no regular COPD meds.    He returns for followup of CHF.  He fatigues easily and feels weak.  No chest pain.  Occasional coughing.  He continues to have dyspnea walking around his house, no change.  He also reports a tremor that is worse with activity.  He moves very slowly per his wife.  He sleeps in a chair due to cough. Weight is stable.   Medtronic device interrogated: Fluid index < threshold with stable impedance, no VT.   Labs (1/19): LDL 80, HDL 42 Labs (4/19): K 3.7, creatinine 1.21, TSH normal Labs (5/19): K 4.3, creatinine 0.97  PMH: 1. Aortic insufficiency: Mild to moderate on most recent echo in 1/17.  2. HTN 3. B12 deficiency 4. Atrial tachycardia: S/p ablation in 1/19.  5. Hypothyroidism 6. Hyperlipidemia 7. Prior PE: Not currently on anticoagulation.  8. H/o VT: has CRT-D device.  9. CAD: h/o BMS to RCA.  - LHC (2/17) with mild nonobstructive CAD, patent RCA stent.  10. Complete heart block: Has Medtronic CRT-D device.   11. Chronic systolic CHF: Probably primarily nonischemic CMP as he does not have extensive enough CAD to explain CMP.  - Echo (2009): EF 20-25%.  - Echo (1/17): EF 55-60%, mild LV dilation, mild-moderate AI, normal RV size and systolic  function.  - RHC (2/17): mean RA 10, PA 44/10, mean PCWP 12, CI 2.18.  - Echo (5/19): EF 40-45%, moderate LVH, mild-moderate AI, normal RV size and systolic function.  - Gynecomastia with spironolactone.  12. COPD: Severe emphysema on prior chest CT.  Smoked in past.   Social History   Socioeconomic History  . Marital status: Married    Spouse name: Not on file  . Number of children: 2  . Years of education: Not on file  . Highest education level: Not on file  Occupational History  . Occupation: Development worker, community - self employed    Employer: RETIRED  Social Needs  . Financial resource strain: Not on file  . Food insecurity:    Worry: Not on file    Inability: Not on file  . Transportation needs:    Medical: Not on file    Non-medical: Not on file  Tobacco Use  . Smoking status: Former Smoker    Packs/day: 1.00    Years: 43.00    Pack years: 43.00    Types: Cigarettes    Start date: 10/17/1957    Last attempt to quit: 12/13/2000    Years since quitting: 17.5  . Smokeless tobacco: Never Used  . Tobacco comment: started at age 35  Substance and Sexual Activity  . Alcohol use: No    Alcohol/week: 0.0 standard  drinks  . Drug use: No  . Sexual activity: Not Currently  Lifestyle  . Physical activity:    Days per week: Not on file    Minutes per session: Not on file  . Stress: Not on file  Relationships  . Social connections:    Talks on phone: Not on file    Gets together: Not on file    Attends religious service: Not on file    Active member of club or organization: Not on file    Attends meetings of clubs or organizations: Not on file    Relationship status: Not on file  . Intimate partner violence:    Fear of current or ex partner: Not on file    Emotionally abused: Not on file    Physically abused: Not on file    Forced sexual activity: Not on file  Other Topics Concern  . Not on file  Social History Narrative   Self employed Development worker, community; 2 daughters.    Family History   Problem Relation Age of Onset  . Diabetes type I Sister        daughter   . Lung cancer Brother        several types of cancer  . Lung cancer Brother        several types of cancer  . Lung cancer Brother   . Clotting disorder Neg Hx        also no repiratory disease   . Prostate cancer Neg Hx    ROS: All systems reviewed and negative except as per HPI.   Current Outpatient Medications  Medication Sig Dispense Refill  . aspirin EC 81 MG tablet Take 81 mg by mouth at bedtime.    . bisoprolol (ZEBETA) 5 MG tablet TAKE ONE TABLET BY MOUTH DAILY. 30 tablet 3  . Calcium Carb-Cholecalciferol (CALCIUM 500 + D3 PO) Take by mouth.    . esomeprazole (NEXIUM) 20 MG capsule Take 1 capsule (20 mg total) by mouth daily at 12 noon. 30 capsule 0  . fluticasone furoate-vilanterol (BREO ELLIPTA) 100-25 MCG/INH AEPB Inhale 1 puff into the lungs daily. 60 each 11  . furosemide (LASIX) 80 MG tablet Take 1 tablet (80 mg total) by mouth every morning. 30 tablet 3  . ipratropium-albuterol (DUONEB) 0.5-2.5 (3) MG/3ML SOLN Take 3 mLs by nebulization 4 (four) times daily. 120 mL 6  . levothyroxine (SYNTHROID, LEVOTHROID) 112 MCG tablet Take 1 tablet (112 mcg total) by mouth daily before breakfast. 90 tablet 2  . Multiple Vitamins-Minerals (MULTIVITAMIN WITH MINERALS) tablet Take 1 tablet by mouth daily.    . niacin 500 MG tablet Take 500 mg by mouth at bedtime.     . pantoprazole (PROTONIX) 40 MG tablet Take 1 tablet (40 mg total) by mouth daily. Take 30 min before bedtime 90 tablet 2  . Polyethyl Glycol-Propyl Glycol (LUBRICANT EYE DROPS) 0.4-0.3 % SOLN Place 1-2 drops into both eyes 2 (two) times daily as needed (for dry/irritated eyes.).    Marland Kitchen potassium chloride SA (K-DUR,KLOR-CON) 20 MEQ tablet Take 1 tablet (20 mEq total) by mouth daily. 90 tablet 3  . sacubitril-valsartan (ENTRESTO) 24-26 MG Take 1 tablet by mouth 2 (two) times daily. 60 tablet 3  . simvastatin (ZOCOR) 20 MG tablet Take 1 tablet (20 mg  total) by mouth at bedtime. 90 tablet 3  . VENTOLIN HFA 108 (90 Base) MCG/ACT inhaler INHALE TWO PUFFS BY MOUTH EVERY 6 HOURS AS NEEDED FOR WHEEZING OR  SHORTNESS  OF  BREATH 18 each 3  . eplerenone (INSPRA) 25 MG tablet Take 1 tablet (25 mg total) by mouth daily. 90 tablet 3   No current facility-administered medications for this encounter.    BP 108/62   Pulse 66   Wt 59.5 kg (131 lb 3.2 oz)   SpO2 93%   BMI 19.37 kg/m  General: NAD Neck: No JVD, no thyromegaly or thyroid nodule.  Lungs: Distant BS.  CV: Nondisplaced PMI.  Heart regular S1/S2, no S3/S4, no murmur.  No peripheral edema.  No carotid bruit.  Normal pedal pulses.  Abdomen: Soft, nontender, no hepatosplenomegaly, no distention.  Skin: Intact without lesions or rashes.  Neurologic: Alert and oriented x 3. Tremor noted at rest.  Psych: Normal affect. Extremities: No clubbing or cyanosis.  HEENT: Normal.   Assessment/Plan: 1. Chronic systolic CHF: Initially, EF low in 20-25% range in 2009.  However, over time, EF improved to 55-60% in 1/17.  Most recent echo in 5/19 with EF 40-45%.  Suspect primarily nonischemic cardiomyopathy as degree of coronary disease does not explain his initial low EF.  Patient has a Medtronic CRT-D device.  NYHA class III symptoms.  As at last appointment, he does not appear volume overloaded by exam or Optivol.  I am concerned that his symptoms are due mainly to his COPD as well as possible parkinsonism.  - Continue bisoprolol (more beta-1 specific than Coreg in the setting of severe COPD).  - Continue Entresto 24/16 bid.  - Continue Lasix 80 mg daily.   - Add eplerenone 25 mg daily, decreased KCl to 20 daily.  He is unable to take spironolactone due to gynecomastia.  BMET today and again in 10 days.   2. COPD: His COPD appears quite severe.  He has had a hard time taking inhalers and recently stopped Duonebs.  Has been difficult to treat him given his side effects with COPD meds.   3. CAD: Cath in  2017 with nonobstructive disease, patent RCA stent.   - Continue statin, ASA 81.  4. Aortic insufficiency: Mild to moderate on last echo.  5. H/o atrial tachycardia: S/p ablation by Dr. Lovena Le, no palpitations.  6. Complete heart block: He has MDT CRT-D device.  7. Pulmonary hypertension: Noted on 2017 RHC, mild and likely group 3 from COPD. 8. Parkinsonism: He has a resting tremor worse with hand use and moves very slowly. I am concerned that he may have Parkinsons.  He is going to be seeing neurology for evaluation soon.    Followup in 3 months.   Loralie Champagne 06/12/2018

## 2018-06-21 ENCOUNTER — Telehealth: Payer: Self-pay

## 2018-06-21 ENCOUNTER — Ambulatory Visit (HOSPITAL_COMMUNITY)
Admission: RE | Admit: 2018-06-21 | Discharge: 2018-06-21 | Disposition: A | Discharge status: Home / Self Care | Payer: Medicare Other | Source: Ambulatory Visit | Attending: Internal Medicine | Admitting: Internal Medicine

## 2018-06-21 DIAGNOSIS — I5022 Chronic systolic (congestive) heart failure: Secondary | ICD-10-CM

## 2018-06-21 LAB — BASIC METABOLIC PANEL
Anion gap: 8 (ref 5–15)
BUN: 17 mg/dL (ref 8–23)
CALCIUM: 9.2 mg/dL (ref 8.9–10.3)
CO2: 31 mmol/L (ref 22–32)
CREATININE: 1.26 mg/dL — AB (ref 0.61–1.24)
Chloride: 105 mmol/L (ref 98–111)
GFR, EST NON AFRICAN AMERICAN: 54 mL/min — AB (ref >60)
Glucose, Bld: 74 mg/dL (ref 70–99)
Potassium: 3.7 mmol/L (ref 3.5–5.1)
SODIUM: 144 mmol/L (ref 135–145)

## 2018-06-21 NOTE — Telephone Encounter (Signed)
Returned call to wife as requested by voice mail message.  She reported patient complains of general weakness that has been occurring off and on for a couple of months.  She is unsure if she discussed with Dr Aundra Dubin at 06/11/2018 office visit.  The weakness occurred yesterday after eating lunch.  Remote transmission was sent for review.  No fluid accumulation and 1 episode VT-NS on 9/1 with duration of :02 sec.  BP after taking his medications has been 81/44, 89/41, 88/53, 101/57.  He has a neurology appointment on 9/16.  Wife also spoke with HF Clinic Nurse on 8/20 regarding weakness and BP.  Advised will send to Dr Aundra Dubin and if any recommendations will call back.  Advised if symptom progresses or he has other non urgent symptoms to make follow up appointment with MD.

## 2018-06-21 NOTE — Telephone Encounter (Signed)
Weakness may be due to low BP.  Would start by cutting bisoprolol in half to 2.5 mg daily and spironolactone in half to 12.5 mg daily.  Have her let us know if this helps.

## 2018-06-22 MED ORDER — BISOPROLOL FUMARATE 5 MG PO TABS: ORAL_TABLET | ORAL | 3 refills | Status: DC

## 2018-06-22 NOTE — Addendum Note (Signed)
Addended by: Rosalene Billings on: 06/22/2018 03:31 PM   Modules accepted: Orders

## 2018-06-22 NOTE — Telephone Encounter (Signed)
Spoke with wife.  Advised Dr Aundra Dubin ordered to cut Bisoprolol dosage to 0.5 tablet (2.5 mg total) by mouth daily.  Advised I am waiting on clarification on other recommendation which is decrease Spironolactone in half but patient is taking eplerenone.  She said patient was switched to the eplerenone because he was unable to tolerate spironolactone.  Advised as soon as I get confirmation that the eplerenone dosage should be decreased will call her back.

## 2018-06-25 MED ORDER — EPLERENONE 25 MG PO TABS: ORAL_TABLET | ORAL | 3 refills | Status: DC

## 2018-06-25 NOTE — Addendum Note (Signed)
Addended by: Rosalene Billings on: 06/25/2018 11:39 AM   Modules accepted: Orders

## 2018-06-25 NOTE — Telephone Encounter (Signed)
Received: 3 days ago  Message Contents  Larey Dresser, MD  Mersadez Linden Panda, RN        Yes eplerenone in half.      Dr Aundra Dubin clarified order of eplerenone

## 2018-06-25 NOTE — Telephone Encounter (Addendum)
Call to wife.  Advised Dr Aundra Dubin clarified order and advised to decrease Eplerenone to 0.5 tablet (12.5 mg total) daily.  She verbalized understanding and requested script be sent to [facility]. She has enough supply on hand. Advised to call if patient's weakness and BP do not improve.

## 2018-06-28 ENCOUNTER — Ambulatory Visit (INDEPENDENT_AMBULATORY_CARE_PROVIDER_SITE_OTHER): Payer: Medicare Other

## 2018-06-28 ENCOUNTER — Ambulatory Visit (INDEPENDENT_AMBULATORY_CARE_PROVIDER_SITE_OTHER): Payer: Medicare Other | Admitting: *Deleted

## 2018-06-28 DIAGNOSIS — I5022 Chronic systolic (congestive) heart failure: Secondary | ICD-10-CM

## 2018-06-28 DIAGNOSIS — Z950 Presence of cardiac pacemaker: Secondary | ICD-10-CM

## 2018-06-28 DIAGNOSIS — I255 Ischemic cardiomyopathy: Secondary | ICD-10-CM

## 2018-06-28 NOTE — Progress Notes (Signed)
Remote pacemaker transmission.   

## 2018-06-28 NOTE — Progress Notes (Signed)
EPIC Encounter for ICM Monitoring  Patient Name: [name] is a 77 y.o. male Date: 06/28/2018 Primary Care Physican: Glenda Chroman, MD Primary Cardiologist:[place] Electrophysiologist: Lovena Le Dry Weight: 127lbs  Bi-V Pacing:99.2 %           Spoke with patient. Heart Failure questions reviewed, pt asymptomatic.  He improvement in BP and general weakness.  He is feeling good today.   Thoracic impedance normal.  Prescribed dosage: Furosemide20 mg 4 tablets (80 mg total) daily.  Labs: 03/16/2018 Creatinine 0.97, BUN 21, Potassium 4.3, Sodium 141, EGFR >60 02/06/2018 Creatinine 1.21, BUN 25, Potassium 3.7, Sodium 139, EGFR 57->60 01/17/2018 Creatinine1.12, BUN18, Potassium3.3, Sodium141, EGFR>60  10/31/2017 Creatinine1.19, BUN26, Potassium4.0, [id number], [id number] 10/18/2017 Creatinine 0.98, BUN 18, Potassium 4.2, Sodium 144, EGFR 75-87 09/15/2017 Creatinine 0.99, BUN 18, Potassium 4.1, Sodium 145, EGFR 74-86 06/12/2017 Creatinine 1.08, BUN 23, Potassium 4.1, Sodium 141, EGFR 67-77 02/22/2017 Creatinine 1.02, BUN 19, Potassium 4.0, Sodium 147, EGFR 72-83 10/25/2016 Creatinine 1.12, BUN 16, Potassium 4.4, Sodium 142, EGFR 63-73 A complete set of results can be found in Results Review.  Recommendations: No changes.   Encouraged to call for fluid symptoms.  Follow-up plan: ICM clinic phone appointment on 07/30/2018.   Office appointment scheduled 09/04/2018 with Dr. Aundra Dubin.    Copy of ICM check sent to Dr. Lovena Le.   3 month ICM trend: 06/28/2018    1 Year ICM trend:       Rosalene Billings, RN 06/28/2018 4:32 PM

## 2018-07-02 ENCOUNTER — Ambulatory Visit (INDEPENDENT_AMBULATORY_CARE_PROVIDER_SITE_OTHER): Payer: Medicare Other | Admitting: Neurology

## 2018-07-02 ENCOUNTER — Encounter

## 2018-07-02 ENCOUNTER — Encounter: Payer: Self-pay | Admitting: Neurology

## 2018-07-02 VITALS — BP 126/74 | HR 67 | Ht 69.0 in | Wt 129.0 lb

## 2018-07-02 DIAGNOSIS — R413 Other amnesia: Secondary | ICD-10-CM | POA: Diagnosis not present

## 2018-07-02 MED ORDER — MEMANTINE HCL 10 MG PO TABS: 10.0000 mg | ORAL_TABLET | Freq: Two times a day (BID) | ORAL | 11 refills | Status: DC

## 2018-07-02 NOTE — Progress Notes (Signed)
PATIENT: [name] DOB: 1940-11-06  Chief Complaint  Patient presents with  . New Patient (Initial Visit)    PCP: Dr. Woody Seller. Referring: Dr. Lovena Le, cardiology. Vision: 20/50 without correction (Pt forgt his glasses). With wife, [name].  . Altered Mental Status    Pt reports that his confusion comes and goes. Some days are better than others. Pt had a CT head and wants to discuss the results.     HISTORICAL  [name] is a 77 year old male, seen in request by his cardiologist Dr. Lovena Le and his primary care physician Dr. Woody Seller, Rennis Petty for evaluation of altered mental status, initial evaluation was on July 02, 2018.  I have reviewed and summarized the referring note from the referring physician.  He has past medical history of hypertension, hyperlipidemia, atrial tachycardia, status post ablation in January 2019, COPD, congestive heart failure, he also has pacemaker, is not an MRI candidate,  Since his ablation surgery in January 2019, he was noted to have worsening confusion, he complains of feeling fatigue, do not have energy over doing something, also has worsening anxiety, tends to become confused, lost train of thoughts during the middle of the conversation, he also become much less active, always moving slow, now has mild worsening gait abnormality, sometimes he went to the kitchen try to fixing a cup of coffee, but could not remember where he laid his cup, he also has hard of hearing  Personally reviewed CT angiogram of head in February 2019, atherosclerotic calcification, mild stenosis in the carotid siphon bilaterally, and in the distal vertebral artery bilaterally, otherwise no significant intracranial stenosis.  Atrophy and chronic microvascular ischemic change in the white matter.  We reviewed laboratory evaluation in September 2019, normal TSH, free T4, BMP showed no significant abnormality, B12 in February 2019 was mildly decreased 218, he was given vitamin B12 IM  supplement, which has helped him some,  He denies a family history of dementia, he is not taking aspirin daily   REVIEW OF SYSTEMS: Full 14 system review of systems performed and notable only for weight loss, fatigue, murmur, trouble swallowing, rash, cough, cramps, easy bruising, memory loss, confusion, weakness, dizziness, tremor, anxiety, decreased energy All other review of systems were negative.  ALLERGIES: Allergies  Allergen Reactions  . Spironolactone Other (See Comments)    gynecomastia    HOME MEDICATIONS: Current Outpatient Medications  Medication Sig Dispense Refill  . aspirin EC 81 MG tablet Take 81 mg by mouth at bedtime.    . bisoprolol (ZEBETA) 5 MG tablet Take 0.5 tablet (2.5 mg total) by mouth once a day. 30 tablet 3  . Calcium Carb-Cholecalciferol (CALCIUM 500 + D3 PO) Take by mouth.    . Cyanocobalamin (VITAMIN B-12 IJ) Inject as directed every 30 (thirty) days.    Marland Kitchen eplerenone (INSPRA) 25 MG tablet Take 0.5 tablet (12.5 mg total) by mouth daily. 45 tablet 3  . furosemide (LASIX) 80 MG tablet Take 1 tablet (80 mg total) by mouth every morning. 30 tablet 3  . levothyroxine (SYNTHROID, LEVOTHROID) 112 MCG tablet Take 1 tablet (112 mcg total) by mouth daily before breakfast. 90 tablet 2  . Polyethyl Glycol-Propyl Glycol (LUBRICANT EYE DROPS) 0.4-0.3 % SOLN Place 1-2 drops into both eyes 2 (two) times daily as needed (for dry/irritated eyes.).    Marland Kitchen potassium chloride SA (K-DUR,KLOR-CON) 20 MEQ tablet Take 1 tablet (20 mEq total) by mouth daily. 90 tablet 3  . sacubitril-valsartan (ENTRESTO) 24-26 MG Take 1 tablet by  mouth 2 (two) times daily. 60 tablet 3  . simvastatin (ZOCOR) 20 MG tablet Take 1 tablet (20 mg total) by mouth at bedtime. 90 tablet 3   No current facility-administered medications for this visit.     PAST MEDICAL HISTORY: Past Medical History:  Diagnosis Date  . AICD (automatic cardioverter/defibrillator) present   . Aortic insufficiency      moderate aortic ins moderate/asymptomatic/normal LV cavity size.   . Carotid artery disease ([place])     less than 50% stenosis bilaterally  . Carotid artery disease ([place])   . Chronic systolic CHF (congestive heart failure) ([place])   . Coronary artery disease   . Drug-induced gynecomastia     secondary to spironolactone  . Dyslipidemia   . Hypertension   . ICD (implantable cardiac defibrillator), biventricular, in situ     Medtronic model  [id number]  . Ischemic cardiomyopathy     status post non-ST elevation microinfarction stent to the right coronary artery is a   . LV dysfunction     NYHA class I/prior ejection fraction 20-25% and an ejection fraction 7 2009 50-55%, ejection fraction 60% bedside echocardiogram July 2012   . PONV (postoperative nausea and vomiting)   . Pulmonary embolism ([facility])    Prior history of pulmonary embolism off Coumadin.  . Ventricular tachycardia ([place])    Inducible ventricular polymorphic tachycardia status post ICD followed by upgrade with CRT-D 2007, battery change at 01/31/2011    PAST SURGICAL HISTORY: Past Surgical History:  Procedure Laterality Date  . ABLATION OF DYSRHYTHMIC FOCUS  10/20/2017   atrial tach  . ATRIAL TACH ABLATION N/A 10/20/2017   Procedure: ATRIAL TACH ABLATION;  Surgeon: Evans Lance, MD;  Location: [facility];  Service: Cardiovascular;  Laterality: N/A;  . CARDIAC CATHETERIZATION    . CARDIAC CATHETERIZATION N/A 12/10/2015   Procedure: Right/Left Heart Cath and Coronary Angiography;  Surgeon: Peter M Martinique, MD;  Location: [facility];  Service: Cardiovascular;  Laterality: N/A;  . CARDIAC DEFIBRILLATOR PLACEMENT     medtronic, remote - yes  . CATARACT EXTRACTION W/PHACO Left 08/25/2014   Procedure: CATARACT EXTRACTION PHACO AND INTRAOCULAR LENS PLACEMENT (IOC);  Surgeon: Tonny Branch, MD;  Location: [facility];  Service: Ophthalmology;  Laterality: Left;  CDE 5.79  . CATARACT EXTRACTION W/PHACO Right 09/18/2014   Procedure:  CATARACT EXTRACTION PHACO AND INTRAOCULAR LENS PLACEMENT RIGHT EYE;  Surgeon: Tonny Branch, MD;  Location: [facility];  Service: Ophthalmology;  Laterality: Right;  CDE:3.35  . CORONARY STENT PLACEMENT    . EP IMPLANTABLE DEVICE N/A 05/03/2016   Procedure: BIV PPM Generator Changeout;  Surgeon: Evans Lance, MD;  Location: [facility];  Service: Cardiovascular;  Laterality: N/A;  . INGUINAL HERNIA REPAIR    . left breast biopsy    . RETROPUBIC PROSTATECTOMY    . THORACOTOMY     left  . THYROID SURGERY    . THYROIDECTOMY      FAMILY HISTORY: Family History  Problem Relation Age of Onset  . Diabetes type I Sister        daughter   . Lung cancer Brother        several types of cancer  . Lung cancer Brother        several types of cancer  . Lung cancer Brother   . Clotting disorder Neg Hx        also no repiratory disease   . Prostate cancer Neg Hx  SOCIAL HISTORY: Social History   Socioeconomic History  . Marital status: Married    Spouse name: Not on file  . Number of children: 2  . Years of education: Not on file  . Highest education level: Not on file  Occupational History  . Occupation: Development worker, community - self employed    Employer: RETIRED  Social Needs  . Financial resource strain: Not on file  . Food insecurity:    Worry: Not on file    Inability: Not on file  . Transportation needs:    Medical: Not on file    Non-medical: Not on file  Tobacco Use  . Smoking status: Former Smoker    Packs/day: 1.00    Years: 43.00    Pack years: 43.00    Types: Cigarettes    Start date: 10/17/1957    Last attempt to quit: 12/13/2000    Years since quitting: 17.5  . Smokeless tobacco: Never Used  . Tobacco comment: started at age 76  Substance and Sexual Activity  . Alcohol use: No    Alcohol/week: 0.0 standard drinks  . Drug use: No  . Sexual activity: Not Currently  Lifestyle  . Physical activity:    Days per week: Not on file    Minutes per session: Not on file  .  Stress: Not on file  Relationships  . Social connections:    Talks on phone: Not on file    Gets together: Not on file    Attends religious service: Not on file    Active member of club or organization: Not on file    Attends meetings of clubs or organizations: Not on file    Relationship status: Not on file  . Intimate partner violence:    Fear of current or ex partner: Not on file    Emotionally abused: Not on file    Physically abused: Not on file    Forced sexual activity: Not on file  Other Topics Concern  . Not on file  Social History Narrative   Self employed Development worker, community; 2 daughters.      PHYSICAL EXAM   Vitals:   07/02/18 1324  BP: 126/74  Pulse: 67  Weight: 129 lb (58.5 kg)  Height: 5\' 9"  (1.753 m)    Not recorded      Body mass index is 19.05 kg/m.  PHYSICAL EXAMNIATION:  Gen: NAD, conversant, well nourised, obese, well groomed                     Cardiovascular: Regular rate rhythm, no peripheral edema, warm, nontender. Eyes: Conjunctivae clear without exudates or hemorrhage Neck: Supple, no carotid bruits. Pulmonary: Clear to auscultation bilaterally   NEUROLOGICAL EXAM:  MENTAL STATUS: MMSE - Mini Mental State Exam 07/02/2018  Orientation to time 3  Orientation to Place 2  Registration 3  Attention/ Calculation 0  Attention/Calculation-comments Refused to spell world backwards  Recall 3  Language- name 2 objects 2  Language- repeat 1  Language- follow 3 step command 2  Language- read & follow direction 1  Write a sentence 1  Copy design 1  Total score 19     CRANIAL NERVES: CN II: Visual fields are full to confrontation. Fundoscopic exam is normal with sharp discs and no vascular changes. Pupils are round equal and briskly reactive to light. CN III, IV, VI: extraocular movement are normal. No ptosis. CN V: Facial sensation is intact to pinprick in all 3 divisions bilaterally. Corneal responses are intact.  CN VII: Face is symmetric with  normal eye closure and smile. CN VIII: Hearing is normal to rubbing fingers CN IX, X: Palate elevates symmetrically. Phonation is normal. CN XI: Head turning and shoulder shrug are intact CN XII: Tongue is midline with normal movements and no atrophy.  MOTOR: There is no pronator drift of out-stretched arms. Muscle bulk and tone are normal. Muscle strength is normal.  REFLEXES: Reflexes are 2+ and symmetric at the biceps, triceps, knees, and ankles. Plantar responses are flexor.  SENSORY: Intact to light touch, pinprick, positional sensation and vibratory sensation are intact in fingers and toes.  COORDINATION: Rapid alternating movements and fine finger movements are intact. There is no dysmetria on finger-to-nose and heel-knee-shin.    GAIT/STANCE: He needs pushed up to get up from seated position,, cautious,   DIAGNOSTIC DATA (LABS, IMAGING, TESTING) - I reviewed patient records, labs, notes, testing and imaging myself where available.   ASSESSMENT AND PLAN  [name] is a 77 y.o. male   Mild cognitive impairment  Mini-Mental status examination is 19 out of 30  Likely central nervous system degenerative disorder, likely a vascular component, not MRI candidate due to pacemaker, angiogram of the head showed generalized generalized atrophy, worsening at bilateral frontal region, there is no acute abnormality.  Continue moderate exercise,  Optimize vascular risk factor control  Namenda 10 mg twice a day   Marcial Pacas, M.D. Ph.D.  [facility] [address], [place], [place] [address] Ph: [phone] Fax: [phone]  CC: Glenda Chroman, MD

## 2018-07-04 ENCOUNTER — Other Ambulatory Visit (HOSPITAL_COMMUNITY): Payer: Self-pay | Admitting: Cardiology

## 2018-07-13 DIAGNOSIS — J440 Chronic obstructive pulmonary disease with acute lower respiratory infection: Secondary | ICD-10-CM | POA: Diagnosis not present

## 2018-07-13 DIAGNOSIS — I5023 Acute on chronic systolic (congestive) heart failure: Secondary | ICD-10-CM | POA: Diagnosis not present

## 2018-07-13 DIAGNOSIS — Z6820 Body mass index (BMI) 20.0-20.9, adult: Secondary | ICD-10-CM | POA: Diagnosis not present

## 2018-07-13 DIAGNOSIS — I10 Essential (primary) hypertension: Secondary | ICD-10-CM | POA: Diagnosis not present

## 2018-07-13 DIAGNOSIS — Z299 Encounter for prophylactic measures, unspecified: Secondary | ICD-10-CM | POA: Diagnosis not present

## 2018-07-13 DIAGNOSIS — J449 Chronic obstructive pulmonary disease, unspecified: Secondary | ICD-10-CM | POA: Diagnosis not present

## 2018-07-13 DIAGNOSIS — J209 Acute bronchitis, unspecified: Secondary | ICD-10-CM | POA: Diagnosis not present

## 2018-07-16 ENCOUNTER — Encounter (INDEPENDENT_AMBULATORY_CARE_PROVIDER_SITE_OTHER): Payer: Self-pay | Admitting: *Deleted

## 2018-07-18 ENCOUNTER — Other Ambulatory Visit: Payer: Self-pay | Admitting: Cardiology

## 2018-07-18 LAB — CUP PACEART REMOTE DEVICE CHECK
Battery Remaining Longevity: 37 mo
Brady Statistic AP VP Percent: 82.19 %
Brady Statistic AS VS Percent: 0.01 %
Date Time Interrogation Session: 20190911235637
Implantable Lead Implant Date: 20071015
Implantable Lead Location: 753859
Implantable Lead Model: 4194
Implantable Lead Model: 6949
Implantable Pulse Generator Implant Date: 20170718
Lead Channel Impedance Value: 247 Ohm
Lead Channel Impedance Value: 456 Ohm
Lead Channel Impedance Value: 494 Ohm
Lead Channel Impedance Value: 589 Ohm
Lead Channel Impedance Value: 646 Ohm
Lead Channel Pacing Threshold Amplitude: 0.625 V
Lead Channel Sensing Intrinsic Amplitude: 28.25 mV
Lead Channel Sensing Intrinsic Amplitude: 4.75 mV
Lead Channel Setting Pacing Amplitude: 3 V
Lead Channel Setting Pacing Amplitude: 3.75 V
Lead Channel Setting Pacing Pulse Width: 1 ms
Lead Channel Setting Sensing Sensitivity: 2.8 mV
MDC IDC LEAD IMPLANT DT: 20050810
MDC IDC LEAD IMPLANT DT: 20050810
MDC IDC LEAD LOCATION: 753858
MDC IDC LEAD LOCATION: 753860
MDC IDC MSMT BATTERY VOLTAGE: 2.98 V
MDC IDC MSMT LEADCHNL LV IMPEDANCE VALUE: 418 Ohm
MDC IDC MSMT LEADCHNL LV IMPEDANCE VALUE: 589 Ohm
MDC IDC MSMT LEADCHNL RA IMPEDANCE VALUE: 494 Ohm
MDC IDC MSMT LEADCHNL RA IMPEDANCE VALUE: 551 Ohm
MDC IDC MSMT LEADCHNL RA PACING THRESHOLD PULSEWIDTH: 0.4 ms
MDC IDC MSMT LEADCHNL RA SENSING INTR AMPL: 4.75 mV
MDC IDC MSMT LEADCHNL RV PACING THRESHOLD AMPLITUDE: 1.375 V
MDC IDC MSMT LEADCHNL RV PACING THRESHOLD PULSEWIDTH: 0.4 ms
MDC IDC MSMT LEADCHNL RV SENSING INTR AMPL: 28.25 mV
MDC IDC SET LEADCHNL RA PACING AMPLITUDE: 1.5 V
MDC IDC SET LEADCHNL RV PACING PULSEWIDTH: 0.6 ms
MDC IDC STAT BRADY AP VS PERCENT: 0 %
MDC IDC STAT BRADY AS VP PERCENT: 17.79 %
MDC IDC STAT BRADY RA PERCENT PACED: 81.35 %
MDC IDC STAT BRADY RV PERCENT PACED: 99.2 %

## 2018-07-25 DIAGNOSIS — I2510 Atherosclerotic heart disease of native coronary artery without angina pectoris: Secondary | ICD-10-CM | POA: Diagnosis not present

## 2018-07-25 DIAGNOSIS — J449 Chronic obstructive pulmonary disease, unspecified: Secondary | ICD-10-CM | POA: Diagnosis not present

## 2018-07-25 DIAGNOSIS — E7800 Pure hypercholesterolemia, unspecified: Secondary | ICD-10-CM | POA: Diagnosis not present

## 2018-07-25 DIAGNOSIS — M159 Polyosteoarthritis, unspecified: Secondary | ICD-10-CM | POA: Diagnosis not present

## 2018-07-29 ENCOUNTER — Other Ambulatory Visit: Payer: Self-pay | Admitting: Neurology

## 2018-07-30 ENCOUNTER — Ambulatory Visit (INDEPENDENT_AMBULATORY_CARE_PROVIDER_SITE_OTHER): Payer: Medicare Other

## 2018-07-30 ENCOUNTER — Telehealth: Payer: Self-pay | Admitting: Internal Medicine

## 2018-07-30 ENCOUNTER — Telehealth: Payer: Self-pay | Admitting: Cardiology

## 2018-07-30 DIAGNOSIS — I5022 Chronic systolic (congestive) heart failure: Secondary | ICD-10-CM

## 2018-07-30 DIAGNOSIS — Z950 Presence of cardiac pacemaker: Secondary | ICD-10-CM | POA: Diagnosis not present

## 2018-07-30 NOTE — Telephone Encounter (Signed)
New Message: ° ° ° °Patient is requesting  a call back  °

## 2018-07-30 NOTE — Telephone Encounter (Signed)
LMOVM reminding pt to send remote transmission.   

## 2018-07-30 NOTE — Telephone Encounter (Signed)
[name] has been experiencing spells of weakness and dizziness. He feels like he may pass out. BPs during these episodes 72/42, 86/54, HRs 61-74. In the morning prior to medications BP is 152/84, 149/68, HR 80s.   Optivol shows decreased thoracic impedance since 07/04/18.   Will forward to Dr. Aundra Dubin and Sharman Cheek, RN (ICM transmission today) as family member reports that his medications have been managed by Dr. Aundra Dubin recently.

## 2018-07-30 NOTE — Telephone Encounter (Signed)
[name] made aware to hold BP meds and restart on Wednesday. Eplerenone and bisoprolol have already been cut in half, so recommended dosages are in effect. [name] has tried having [name] take them in the evenings to help with symptoms.

## 2018-07-30 NOTE — Telephone Encounter (Signed)
Hold all BP meds x 1 day.  Then decrease eplerenone to 12.5 mg daily and take in the evening and decrease bisoprolol to 2.5 mg daily and take in the evening.  Restart Entresto after 1 day.  Please call us in a couple days about BP on these medications.

## 2018-07-31 NOTE — Progress Notes (Signed)
EPIC Encounter for ICM Monitoring  Patient Name: [name] is a 77 y.o. male Date: 07/31/2018 Primary Care Physican: Glenda Chroman, MD Primary Cardiologist:[facility] Electrophysiologist: Lovena Le Dry Weight: 127lbs  Bi-V Pacing:98.1%       Spoke with wife today. She is concerned with patient's condition.  Pt felt like he was going to pass out on Sunday during church, he remains very weak, and she reported low BP with dizzines, to device clinic 10/14.    Recommendations from Dr Aundra Dubin were given, to wife, by Debroah Loop, device RN 10/14. The recommendation was to hold all BP meds x 1 day.  Then decrease eplerenone to 12.5 mg daily and take in the evening and decrease bisoprolol to 2.5 mg daily and take in the evening.  Restart Entresto after 1 day.    Thoracic impedance abnormal suggesting fluid accumulation starting 07/05/2018.  Prescribed: Furosemide20 mg 4 tablets (80 mg total)daily.  Labs: 06/21/2018 Creatinine 1.26, BUN 17, Potassium 3.7, Sodium 144, eGFR 54->60 06/11/2018 Creatinine 1.00, BUN 20, Potassium 4.0, Sodium 145, eGFR >60 03/16/2018 Creatinine 0.97, BUN 21, Potassium 4.3, Sodium 141, EGFR >60 02/06/2018 Creatinine 1.21, BUN 25, Potassium 3.7, Sodium 139, EGFR 57->60 01/17/2018 Creatinine1.12, BUN18, Potassium3.3, Sodium141, EGFR>60  10/31/2017 Creatinine1.19, BUN26, Potassium4.0, [id number], [id number] 10/18/2017 Creatinine 0.98, BUN 18, Potassium 4.2, Sodium 144, EGFR 75-87 09/15/2017 Creatinine 0.99, BUN 18, Potassium 4.1, Sodium 145, EGFR 74-86 06/12/2017 Creatinine 1.08, BUN 23, Potassium 4.1, Sodium 141, EGFR 67-77 02/22/2017 Creatinine 1.02, BUN 19, Potassium 4.0, Sodium 147, EGFR 72-83 10/25/2016 Creatinine 1.12, BUN 16, Potassium 4.4, Sodium 142, EGFR 63-73 A complete set of results can be found in Results Review.  Recommendations:  Wife reported eplerenone and bisoprolol were cut in half in September so those are the dosages he has been  taking for the past month.   Wife has numerous questions regarding patients health and why he is feeling so bad.  Advised to call HF clinic to check if Dr Aundra Dubin or PA has any available office appointments available before 11/19.  Follow-up plan: ICM clinic phone appointment on 08/07/2018 to recheck fluid levels.   Office appointment scheduled 09/04/2018 with Dr. Aundra Dubin.    Copy of ICM check sent to Dr. Lovena Le and Dr Aundra Dubin for review and recommendations if needed.   3 month ICM trend: 07/30/2018    1 Year ICM trend:       Rosalene Billings, RN 07/31/2018 9:17 AM

## 2018-07-31 NOTE — Progress Notes (Addendum)
Advanced Heart Failure Clinic Note  PCP: Dr. Woody Seller EP: Dr. Lovena Le  HF Cardiology: Dr. Aundra Dubin  77 y.o. referred by Dr. Lovena Le for evaluation of CHF. Patient has a long cardiac history.  He had a prior PCI to the RCA with BMS.  Most recent cath in 2/17 showed nonobstructive CAD, patent RCA stent.  In 2009, echo showed EF 20-25%.  He went on to have ICD placed and later, CRT-P when he developed complete heart block requiring CRT upgrade (no longer has defibrillator). Echo in 1/17 showed EF improved to 55-60%. Echo in 5/19 showed EF 40-45%, moderate LVH, mild-moderate AI, normal RV.    Patient has been seeing pulmonary.  He does not appear to have been very compliant with inhalers.  He was started on Duonebs by Dr. Lenna Gilford but stopped it because it was "messing up my eyes."  He is currently on no regular COPD meds.    He returns today as an add on for symptomatic hypotension with his wife and daughter. He called clinic on 10/14 and was told to hold BP meds x1 day, then decrease bisoprolol and spiro. Overall doing poorly. He has felt weak for about 1 year, worse over the last 2 months to the point where he feels like he cannot do much of anything. His wife checks his BP several times throughout the day. He runs SBP 140s prior to taking meds and drops throughout the day to SBP 80s. He has some dizziness after standing, but only after eating. Very fatigued. Appetite okay. Occasional SOB when walking on treadmill. No orthopnea, PND, or edema. No CP or palpitations. On Sunday during church he felt very badly, "like he was dying". Denies dizziness or pain at the time. Wife says they sent in a transmission and that he had an event, but was not sure what it was. He last took BP meds on Monday morning, but has continued to take lasix 80 mg daily. Weight down 1 lb on our scale.   Medtronic device interrogated: fluid trending up on optivol, above treshold for about 1 week. Thoracic impedence below threshold. No AF/AT.  Active ~1 hour/day. Per Rep, he had 11 seconds of VT at 160s on Sunday. 100% BiV pacing. Defibrillator portion is off.   Labs (1/19): LDL 80, HDL 42 Labs (4/19): K 3.7, creatinine 1.21, TSH normal Labs (5/19): K 4.3, creatinine 0.97 Labs (9/19): K 3.7, creatinine 1.26  ECG (personally reviewed): a-paced, BiV paced  PMH: 1. Aortic insufficiency: Mild to moderate on most recent echo in 1/17.  2. HTN 3. B12 deficiency 4. Atrial tachycardia: S/p ablation in 1/19.  5. Hypothyroidism 6. Hyperlipidemia 7. Prior PE: Not currently on anticoagulation.  8. H/o VT: had CRT-D device, CRT-P since most recent generator change. .  9. CAD: h/o BMS to RCA.  - LHC (2/17) with mild nonobstructive CAD, patent RCA stent.  10. Complete heart block: Has Medtronic CRT-P device.   11. Chronic systolic CHF: Probably primarily nonischemic CMP as he does not have extensive enough CAD to explain CMP.  - Echo (2009): EF 20-25%.  - Echo (1/17): EF 55-60%, mild LV dilation, mild-moderate AI, normal RV size and systolic function.  - RHC (2/17): mean RA 10, PA 44/10, mean PCWP 12, CI 2.18.  - Echo (5/19): EF 40-45%, moderate LVH, mild-moderate AI, normal RV size and systolic function.  - Gynecomastia with spironolactone.  12. COPD: Severe emphysema on prior chest CT.  Smoked in past.   Review of systems complete and  found to be negative unless listed in HPI.   Social History   Socioeconomic History  . Marital status: Married    Spouse name: Not on file  . Number of children: 2  . Years of education: Not on file  . Highest education level: Not on file  Occupational History  . Occupation: Development worker, community - self employed    Employer: RETIRED  Social Needs  . Financial resource strain: Not on file  . Food insecurity:    Worry: Not on file    Inability: Not on file  . Transportation needs:    Medical: Not on file    Non-medical: Not on file  Tobacco Use  . Smoking status: Former Smoker    Packs/day: 1.00     Years: 43.00    Pack years: 43.00    Types: Cigarettes    Start date: 10/17/1957    Last attempt to quit: 12/13/2000    Years since quitting: 17.6  . Smokeless tobacco: Never Used  . Tobacco comment: started at age 34  Substance and Sexual Activity  . Alcohol use: No    Alcohol/week: 0.0 standard drinks  . Drug use: No  . Sexual activity: Not Currently  Lifestyle  . Physical activity:    Days per week: Not on file    Minutes per session: Not on file  . Stress: Not on file  Relationships  . Social connections:    Talks on phone: Not on file    Gets together: Not on file    Attends religious service: Not on file    Active member of club or organization: Not on file    Attends meetings of clubs or organizations: Not on file    Relationship status: Not on file  . Intimate partner violence:    Fear of current or ex partner: Not on file    Emotionally abused: Not on file    Physically abused: Not on file    Forced sexual activity: Not on file  Other Topics Concern  . Not on file  Social History Narrative   Self employed Development worker, community; 2 daughters.    Family History  Problem Relation Age of Onset  . Diabetes type I Sister        daughter   . Lung cancer Brother        several types of cancer  . Lung cancer Brother        several types of cancer  . Lung cancer Brother   . Clotting disorder Neg Hx        also no repiratory disease   . Prostate cancer Neg Hx    Review of systems complete and found to be negative unless listed in HPI.   Current Outpatient Medications  Medication Sig Dispense Refill  . aspirin EC 81 MG tablet Take 81 mg by mouth at bedtime.    . Calcium Carb-Cholecalciferol (CALCIUM 500 + D3 PO) Take by mouth.    . Cyanocobalamin (VITAMIN B-12 IJ) Inject as directed every 30 (thirty) days.    . furosemide (LASIX) 80 MG tablet Take 1 tablet (80 mg total) by mouth every morning. 30 tablet 3  . levothyroxine (SYNTHROID, LEVOTHROID) 112 MCG tablet Take 1 tablet (112  mcg total) by mouth daily before breakfast. 90 tablet 2  . memantine (NAMENDA) 10 MG tablet TAKE 1 TABLET BY MOUTH TWICE A DAY 180 tablet 4  . Polyethyl Glycol-Propyl Glycol (LUBRICANT EYE DROPS) 0.4-0.3 % SOLN Place 1-2 drops into both eyes  2 (two) times daily as needed (for dry/irritated eyes.).    Marland Kitchen potassium chloride SA (K-DUR,KLOR-CON) 20 MEQ tablet Take 1 tablet (20 mEq total) by mouth daily. 90 tablet 3  . simvastatin (ZOCOR) 20 MG tablet Take 1 tablet (20 mg total) by mouth at bedtime. 90 tablet 3  . bisoprolol (ZEBETA) 5 MG tablet Take 0.5 tablet (2.5 mg total) by mouth once a day. (Patient not taking: Reported on 08/01/2018) 30 tablet 3  . ENTRESTO 24-26 MG TAKE 1 TABLET BY MOUTH TWICE A DAY (Patient not taking: Reported on 08/01/2018) 60 tablet 5  . eplerenone (INSPRA) 25 MG tablet Take 0.5 tablet (12.5 mg total) by mouth daily. (Patient not taking: Reported on 08/01/2018) 45 tablet 3   No current facility-administered medications for this encounter.    BP (!) 144/73   Pulse 64   Wt 59.3 kg (130 lb 12.8 oz)   SpO2 93%   BMI 19.32 kg/m   Wt Readings from Last 3 Encounters:  08/01/18 59.3 kg (130 lb 12.8 oz)  07/02/18 58.5 kg (129 lb)  06/11/18 59.5 kg (131 lb 3.2 oz)   General: Thin. Elderly. No resp difficulty. HEENT: Normal Neck: Supple. JVP flat. Carotids 2+ bilat; no bruits. No thyromegaly or nodule noted. Cor: PMI nondisplaced. RRR, No M/G/R noted Lungs: coarse throughout. No wheezing or crackles.  Abdomen: Soft, non-tender, non-distended, no HSM. No bruits or masses. +BS  Extremities: No cyanosis, clubbing, or rash. R and LLE no edema.  Neuro: Alert & orientedx3, cranial nerves grossly intact. moves all 4 extremities w/o difficulty. Affect pleasant  EKG: AV paced 61 bpm. Personally reviewed.   Assessment/Plan: 1. Chronic systolic CHF: Initially, EF low in 20-25% range in 2009.  However, over time, EF improved to 55-60% in 1/17.  Echo in 5/19 with EF 40-45%.   Suspect primarily nonischemic cardiomyopathy as degree of coronary disease does not explain his initial low EF.  Patient has a Medtronic CRT-P device.  NYHA class III symptoms.  Symptoms are due mainly to his COPD as well as possible parkinsonism.  - Volume stable to dry on exam. Elevated on optivol (does not seem accurate) ReDS 14% - Restart bisoprolol 2.5 mg daily (more beta-1 specific than Coreg in the setting of severe COPD).  - Stop Entresto. Start losartan 12.5 mg daily.   - Decrease lasix to 60 mg daily - Restart eplerenone 12.5 mg qHS - Repeat echo with worse symptoms.  2. COPD: His COPD appears quite severe.  He has had a hard time taking inhalers and recently stopped Duonebs.  Has been difficult to treat him given his side effects with COPD meds.  No change.  3. CAD: Cath in 2017 with nonobstructive disease, patent RCA stent.  No s/s ischemia. - Continue statin, ASA 81.  4. Aortic insufficiency: Mild to moderate on last echo.  5. H/o atrial tachycardia: S/p ablation by Dr. Lovena Le, no palpitations.  6. Complete heart block: He has MDT CRT-P device.  7. Pulmonary hypertension: Noted on 2017 RHC, mild and likely group 3 from COPD. No change.  8. Parkinsonism: He has a resting tremor worse with hand use and moves very slowly. Concern that he may have Parkinsons.  He is going to be seeing neurology for evaluation soon.   9. Hypotension - Med changes as above 10. Vtach  - 11 seconds of VT 160s on PPM interrogation on Sunday, 10/13 - Check BMET, mag - Restart bisoprolol as above.  - Repeat echo as above.  BMET, mag Restart eplerenone 12.5 mg qHS Restart bisoprolol 2.5 mg qHS DC entresto Start losartan 12.5 mg daily Decrease lasix to 60 mg daily Follow up in 2 weeks  Georgiana Shore 08/01/2018  Greater than 50% of the 45 minute visit was spent in counseling/coordination of care regarding disease state education, salt/fluid restriction, sliding scale diuretics, and medication  compliance.  Patient seen with NP, agree with the above note.   For over a year, he has had severe fatigue and exertional dyspnea.  Recently, he has been worse.  He has been feeling more weak and SBP has been as low as the 80s.  He had an episode of VT on Sunday with lightheadedness but no syncope.  With very low BP, bisoprolol, eplerenone, and Entresto are currently on hold.  He does feel better holding these medications and BP is higher today.   On exam, he is not volume overloaded.  REDS clip 14%.    I think that his worsening symptomatology is due to hyportension with SBP down to 80s.  He has baseline severe dyspnea that is probably more due to COPD than CHF as he has not been particularly volume overloaded in recent months.  I do not think that he is volume overloaded today (seems to be on the drier side).  - Stay off Entresto, can instead use losartan 12.5 mg daily.  - Restart bisoprolol 2.5 mg daily and eplerenone 12.5 daily, take in the evening.  - Decrease Lasix to 60 mg daily.  - Repeat echo.   In terms of VT, he has had no chest pain.  He no longer has an ICD.   - Restart bisoprolol as above.  - I would like to avoid use of amiodarone with severe COPD.  - I would try to avoid upgrading device to add defibrillator.   I will refer him to pulmonary rehab.   Loralie Champagne 08/01/2018

## 2018-07-31 NOTE — Telephone Encounter (Signed)
Patient has an appt with CHF clinic for 10/16 @ 1330.

## 2018-08-01 ENCOUNTER — Encounter (HOSPITAL_COMMUNITY): Payer: Self-pay

## 2018-08-01 ENCOUNTER — Ambulatory Visit (HOSPITAL_COMMUNITY)
Admission: RE | Admit: 2018-08-01 | Discharge: 2018-08-01 | Disposition: A | Discharge status: Home / Self Care | Payer: Medicare Other | Source: Ambulatory Visit | Attending: Internal Medicine | Admitting: Internal Medicine

## 2018-08-01 VITALS — BP 144/73 | HR 64 | Wt 130.8 lb

## 2018-08-01 DIAGNOSIS — E039 Hypothyroidism, unspecified: Secondary | ICD-10-CM | POA: Insufficient documentation

## 2018-08-01 DIAGNOSIS — E785 Hyperlipidemia, unspecified: Secondary | ICD-10-CM | POA: Insufficient documentation

## 2018-08-01 DIAGNOSIS — I959 Hypotension, unspecified: Secondary | ICD-10-CM

## 2018-08-01 DIAGNOSIS — Z7982 Long term (current) use of aspirin: Secondary | ICD-10-CM | POA: Insufficient documentation

## 2018-08-01 DIAGNOSIS — Z955 Presence of coronary angioplasty implant and graft: Secondary | ICD-10-CM | POA: Diagnosis not present

## 2018-08-01 DIAGNOSIS — I472 Ventricular tachycardia, unspecified: Secondary | ICD-10-CM

## 2018-08-01 DIAGNOSIS — G20 Parkinson's disease: Secondary | ICD-10-CM | POA: Diagnosis not present

## 2018-08-01 DIAGNOSIS — I255 Ischemic cardiomyopathy: Secondary | ICD-10-CM | POA: Diagnosis not present

## 2018-08-01 DIAGNOSIS — I471 Supraventricular tachycardia: Secondary | ICD-10-CM | POA: Diagnosis not present

## 2018-08-01 DIAGNOSIS — Z86711 Personal history of pulmonary embolism: Secondary | ICD-10-CM | POA: Diagnosis not present

## 2018-08-01 DIAGNOSIS — I5022 Chronic systolic (congestive) heart failure: Secondary | ICD-10-CM

## 2018-08-01 DIAGNOSIS — I442 Atrioventricular block, complete: Secondary | ICD-10-CM | POA: Diagnosis not present

## 2018-08-01 DIAGNOSIS — I2510 Atherosclerotic heart disease of native coronary artery without angina pectoris: Secondary | ICD-10-CM

## 2018-08-01 DIAGNOSIS — Z79899 Other long term (current) drug therapy: Secondary | ICD-10-CM | POA: Insufficient documentation

## 2018-08-01 DIAGNOSIS — I351 Nonrheumatic aortic (valve) insufficiency: Secondary | ICD-10-CM | POA: Insufficient documentation

## 2018-08-01 DIAGNOSIS — Z87891 Personal history of nicotine dependence: Secondary | ICD-10-CM | POA: Diagnosis not present

## 2018-08-01 DIAGNOSIS — J449 Chronic obstructive pulmonary disease, unspecified: Secondary | ICD-10-CM

## 2018-08-01 DIAGNOSIS — Z79890 Hormone replacement therapy (postmenopausal): Secondary | ICD-10-CM | POA: Diagnosis not present

## 2018-08-01 DIAGNOSIS — I110 Hypertensive heart disease with heart failure: Secondary | ICD-10-CM | POA: Diagnosis not present

## 2018-08-01 DIAGNOSIS — I272 Pulmonary hypertension, unspecified: Secondary | ICD-10-CM | POA: Insufficient documentation

## 2018-08-01 LAB — MAGNESIUM: Magnesium: 2.5 mg/dL — ABNORMAL HIGH (ref 1.7–2.4)

## 2018-08-01 LAB — BASIC METABOLIC PANEL
ANION GAP: 8 (ref 5–15)
BUN: 19 mg/dL (ref 8–23)
CALCIUM: 9.4 mg/dL (ref 8.9–10.3)
CO2: 32 mmol/L (ref 22–32)
CREATININE: 1.07 mg/dL (ref 0.61–1.24)
Chloride: 102 mmol/L (ref 98–111)
GFR calc Af Amer: >60 mL/min (ref >60)
GFR calc non Af Amer: >60 mL/min (ref >60)
Glucose, Bld: 88 mg/dL (ref 70–99)
POTASSIUM: 4 mmol/L (ref 3.5–5.1)
SODIUM: 142 mmol/L (ref 135–145)

## 2018-08-01 MED ORDER — FUROSEMIDE 20 MG PO TABS: 60.0000 mg | ORAL_TABLET | Freq: Every morning | ORAL | 2 refills | Status: DC

## 2018-08-01 MED ORDER — EPLERENONE 25 MG PO TABS: ORAL_TABLET | ORAL | 3 refills | Status: DC

## 2018-08-01 MED ORDER — BISOPROLOL FUMARATE 5 MG PO TABS: ORAL_TABLET | ORAL | 3 refills | Status: DC

## 2018-08-01 MED ORDER — LOSARTAN POTASSIUM 25 MG PO TABS: 12.5000 mg | ORAL_TABLET | Freq: Every day | ORAL | 11 refills | Status: DC

## 2018-08-01 MED ORDER — FUROSEMIDE 80 MG PO TABS: 60.0000 mg | ORAL_TABLET | Freq: Every morning | ORAL | 3 refills | Status: DC

## 2018-08-01 NOTE — Patient Instructions (Signed)
Today you have been seen at the Heart failure clinic at [facility]   Medication changes: Restart Bisoprolol 2.5 mg at night    Eplerenone 12.5 mg at night  Start Losartan 12.5 mg daily  Decrease lasix to 60 mg daily   Stop taken [employer] work done today: BMET and MAG  We will call you if your lab work is abnormal.. No news is good news!!   Follow up with NP/PA in 2 weeks  You have the following test scheduled for: ECHO  You have been referred to:  Pulmonary Rehab at [facility] they will call you to set up an appointment  Do the following things EVERYDAY: 1) Weigh yourself in the morning before breakfast. Write it down and keep it in a log. 2) Take your medicines as prescribed 3) Eat low salt foods-Limit salt (sodium) to 2000 mg per day.  4) Stay as active as you can everyday 5) Limit all fluids for the day to less than 2 liters  Your physician has requested that you have an echocardiogram. Echocardiography is a painless test that uses sound waves to create images of your heart. It provides your doctor with information about the size and shape of your heart and how well your heart's chambers and valves are working. This procedure takes approximately one hour. There are no restrictions for this procedure.

## 2018-08-01 NOTE — Progress Notes (Signed)
ReDS Vest - 08/01/18 1400      ReDS Vest   MR   No    Fitting Posture  Sitting    Height Marker  Tall   station C    Ruler Value  [place]  Aligned

## 2018-08-07 ENCOUNTER — Telehealth: Payer: Self-pay

## 2018-08-07 ENCOUNTER — Ambulatory Visit (INDEPENDENT_AMBULATORY_CARE_PROVIDER_SITE_OTHER): Payer: Medicare Other

## 2018-08-07 DIAGNOSIS — Z950 Presence of cardiac pacemaker: Secondary | ICD-10-CM

## 2018-08-07 DIAGNOSIS — I5022 Chronic systolic (congestive) heart failure: Secondary | ICD-10-CM

## 2018-08-07 NOTE — Telephone Encounter (Signed)
Remote ICM transmission received.  Attempted call to wife regarding ICM remote transmission and no answer. 

## 2018-08-07 NOTE — Progress Notes (Signed)
EPIC Encounter for ICM Monitoring  Patient Name: [name] is a 77 y.o. male Date: 08/07/2018 Primary Care Physican: Glenda Chroman, MD Primary Cardiologist:[place] Electrophysiologist: Lovena Le Dry Weight:127lbs  Bi-V Pacing:99.3%          Spoke with wife.  Heart Failure questions reviewed, she reported patient is feeling better since BP meds were adjusted at 10/16 office visit.  ReDS 14% at HF Clinic appt on 10/16    Thoracic impedance normal.   Prescribed: Furosemide20 mg 3 tablets (60 mg total)daily.  Labs: 08/01/2018 Creatinine 1.07, BUN 19, Potassium 4.0, Sodium 142, eGFR >60 06/21/2018 Creatinine 1.26, BUN 17, Potassium 3.7, Sodium 144, eGFR 54->60 06/11/2018 Creatinine 1.00, BUN 20, Potassium 4.0, Sodium 145, eGFR >60 03/16/2018 Creatinine 0.97, BUN 21, Potassium 4.3, Sodium 141, EGFR >60 02/06/2018 Creatinine 1.21, BUN 25, Potassium 3.7, Sodium 139, EGFR 57->60 01/17/2018 Creatinine1.12, BUN18, Potassium3.3, Sodium141, EGFR>60  10/31/2017 Creatinine1.19, BUN26, Potassium4.0, [id number], [id number] 10/18/2017 Creatinine 0.98, BUN 18, Potassium 4.2, Sodium 144, EGFR 75-87 09/15/2017 Creatinine 0.99, BUN 18, Potassium 4.1, Sodium 145, EGFR 74-86 06/12/2017 Creatinine 1.08, BUN 23, Potassium 4.1, Sodium 141, EGFR 67-77 02/22/2017 Creatinine 1.02, BUN 19, Potassium 4.0, Sodium 147, EGFR 72-83 10/25/2016 Creatinine 1.12, BUN 16, Potassium 4.4, Sodium 142, EGFR 63-73 A complete set of results can be found in Results Review.  Recommendations: No changes.  Encouraged to call for fluid symptoms.  Follow-up plan: ICM clinic phone appointment on 08/30/2018.   Office appointment scheduled 08/16/2018 with HF clinic and 09/04/2018 with Dr. Aundra Dubin.    Copy of ICM check sent to Dr. Lovena Le.   3 month ICM trend: 08/07/2018    1 Year ICM trend:       Rosalene Billings, RN 08/07/2018 9:54 AM

## 2018-08-08 ENCOUNTER — Telehealth: Payer: Self-pay | Admitting: Cardiology

## 2018-08-08 ENCOUNTER — Telehealth (HOSPITAL_COMMUNITY): Payer: Self-pay | Admitting: *Deleted

## 2018-08-08 NOTE — Telephone Encounter (Signed)
Pt called to get scheduled - after reading referral advised pt's wife to call MD and request they sign the referral in epic. Russella Dar is waiting for MD signature and the she will call pt to schedule. NF 08/08/2018

## 2018-08-08 NOTE — Telephone Encounter (Signed)
Patient daughter called and stated that patient was seen last week pt was seen in HF clinic and he was having episodes of VT. Pt daughter stated that MD told them if it continued pt would need to be seen. Pt daughter stated that yesterday (Tuesday 08-07-18) around 4PM pt had a dizzy spell. They sent a remote transmission. Call routed to [place].

## 2018-08-08 NOTE — Telephone Encounter (Signed)
Spoke with pts daughter informed her that there had been no recorded episodes since pt had been seen in HF clinic

## 2018-08-14 ENCOUNTER — Other Ambulatory Visit: Payer: Self-pay | Admitting: Internal Medicine

## 2018-08-16 ENCOUNTER — Ambulatory Visit (HOSPITAL_COMMUNITY)
Admission: RE | Admit: 2018-08-16 | Discharge: 2018-08-16 | Disposition: A | Discharge status: Home / Self Care | Payer: Medicare Other | Source: Ambulatory Visit | Attending: Internal Medicine | Admitting: Internal Medicine

## 2018-08-16 ENCOUNTER — Encounter (HOSPITAL_COMMUNITY): Payer: Self-pay

## 2018-08-16 ENCOUNTER — Ambulatory Visit (HOSPITAL_BASED_OUTPATIENT_CLINIC_OR_DEPARTMENT_OTHER)
Admission: RE | Admit: 2018-08-16 | Discharge: 2018-08-16 | Disposition: A | Discharge status: Home / Self Care | Payer: Medicare Other | Source: Ambulatory Visit | Attending: Internal Medicine | Admitting: Internal Medicine

## 2018-08-16 ENCOUNTER — Telehealth (HOSPITAL_COMMUNITY): Payer: Self-pay

## 2018-08-16 ENCOUNTER — Encounter (INDEPENDENT_AMBULATORY_CARE_PROVIDER_SITE_OTHER): Payer: Self-pay | Admitting: *Deleted

## 2018-08-16 VITALS — BP 134/60 | HR 60 | Wt 129.0 lb

## 2018-08-16 DIAGNOSIS — I351 Nonrheumatic aortic (valve) insufficiency: Secondary | ICD-10-CM | POA: Insufficient documentation

## 2018-08-16 DIAGNOSIS — I472 Ventricular tachycardia, unspecified: Secondary | ICD-10-CM

## 2018-08-16 DIAGNOSIS — Z7982 Long term (current) use of aspirin: Secondary | ICD-10-CM | POA: Diagnosis not present

## 2018-08-16 DIAGNOSIS — Z955 Presence of coronary angioplasty implant and graft: Secondary | ICD-10-CM | POA: Diagnosis not present

## 2018-08-16 DIAGNOSIS — I442 Atrioventricular block, complete: Secondary | ICD-10-CM | POA: Insufficient documentation

## 2018-08-16 DIAGNOSIS — I110 Hypertensive heart disease with heart failure: Secondary | ICD-10-CM | POA: Diagnosis not present

## 2018-08-16 DIAGNOSIS — I2510 Atherosclerotic heart disease of native coronary artery without angina pectoris: Secondary | ICD-10-CM | POA: Diagnosis not present

## 2018-08-16 DIAGNOSIS — G20 Parkinson's disease: Secondary | ICD-10-CM | POA: Insufficient documentation

## 2018-08-16 DIAGNOSIS — I255 Ischemic cardiomyopathy: Secondary | ICD-10-CM

## 2018-08-16 DIAGNOSIS — Z79899 Other long term (current) drug therapy: Secondary | ICD-10-CM | POA: Diagnosis not present

## 2018-08-16 DIAGNOSIS — J449 Chronic obstructive pulmonary disease, unspecified: Secondary | ICD-10-CM

## 2018-08-16 DIAGNOSIS — I272 Pulmonary hypertension, unspecified: Secondary | ICD-10-CM | POA: Insufficient documentation

## 2018-08-16 DIAGNOSIS — I471 Supraventricular tachycardia: Secondary | ICD-10-CM | POA: Insufficient documentation

## 2018-08-16 DIAGNOSIS — E785 Hyperlipidemia, unspecified: Secondary | ICD-10-CM | POA: Insufficient documentation

## 2018-08-16 DIAGNOSIS — Z950 Presence of cardiac pacemaker: Secondary | ICD-10-CM

## 2018-08-16 DIAGNOSIS — I5022 Chronic systolic (congestive) heart failure: Secondary | ICD-10-CM

## 2018-08-16 DIAGNOSIS — E039 Hypothyroidism, unspecified: Secondary | ICD-10-CM | POA: Diagnosis not present

## 2018-08-16 DIAGNOSIS — Z87891 Personal history of nicotine dependence: Secondary | ICD-10-CM | POA: Insufficient documentation

## 2018-08-16 DIAGNOSIS — I509 Heart failure, unspecified: Secondary | ICD-10-CM | POA: Diagnosis present

## 2018-08-16 DIAGNOSIS — Z79890 Hormone replacement therapy (postmenopausal): Secondary | ICD-10-CM | POA: Diagnosis not present

## 2018-08-16 DIAGNOSIS — Z86711 Personal history of pulmonary embolism: Secondary | ICD-10-CM | POA: Insufficient documentation

## 2018-08-16 LAB — BASIC METABOLIC PANEL
ANION GAP: 6 (ref 5–15)
BUN: 15 mg/dL (ref 8–23)
CALCIUM: 9.4 mg/dL (ref 8.9–10.3)
CO2: 30 mmol/L (ref 22–32)
Chloride: 103 mmol/L (ref 98–111)
Creatinine, Ser: 1.03 mg/dL (ref 0.61–1.24)
Glucose, Bld: 85 mg/dL (ref 70–99)
Potassium: 4.1 mmol/L (ref 3.5–5.1)
SODIUM: 139 mmol/L (ref 135–145)

## 2018-08-16 LAB — CBC
HCT: 48 % (ref 39.0–52.0)
Hemoglobin: 14.7 g/dL (ref 13.0–17.0)
MCH: 28.4 pg (ref 26.0–34.0)
MCHC: 30.6 g/dL (ref 30.0–36.0)
MCV: 92.7 fL (ref 80.0–100.0)
NRBC: 0.3 % — AB (ref 0.0–0.2)
PLATELETS: 210 10*3/uL (ref 150–400)
RBC: 5.18 MIL/uL (ref 4.22–5.81)
RDW: 13.2 % (ref 11.5–15.5)
WBC: 6.2 10*3/uL (ref 4.0–10.5)

## 2018-08-16 LAB — MAGNESIUM: MAGNESIUM: 2.3 mg/dL (ref 1.7–2.4)

## 2018-08-16 LAB — TSH: TSH: 7.482 u[IU]/mL — ABNORMAL HIGH (ref 0.350–4.500)

## 2018-08-16 LAB — T4, FREE: Free T4: 1.45 ng/dL (ref 0.82–1.77)

## 2018-08-16 MED ORDER — EPLERENONE 25 MG PO TABS: 25.0000 mg | ORAL_TABLET | Freq: Every day | ORAL | 3 refills | Status: DC

## 2018-08-16 MED ORDER — LOSARTAN POTASSIUM 25 MG PO TABS: 12.5000 mg | ORAL_TABLET | Freq: Every day | ORAL | 11 refills | Status: DC

## 2018-08-16 NOTE — Progress Notes (Signed)
Advanced Heart Failure Clinic Note  PCP: Dr. Woody Seller EP: Dr. 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 male referred by Dr. Lovena Le for evaluation of CHF. Patient has a long cardiac history.  He had a prior PCI to the RCA with BMS.  Most recent cath in 2/17 showed nonobstructive CAD, patent RCA stent.  In 2009, echo showed EF 20-25%.  He went on to have ICD placed and later, CRT-P when he developed complete heart block requiring CRT upgrade (no longer has defibrillator). Echo in 1/17 showed EF improved to 55-60%. Echo in 5/19 showed EF 40-45%, moderate LVH, mild-moderate AI, normal RV.    Patient has been seeing pulmonary.  He does not appear to have been very compliant with inhalers.  He was started on Duonebs by Dr. Lenna Gilford but stopped it because it was "messing up my eyes."  He is currently on no regular COPD meds.    He presents today for follow up with his wife and daughter. Had orthostatics at last visit. Meds adjusted. Started back on low dose lisinopril and bisoporol. Spiro cut back. Weight down 1 lb. He has had no further episodes where he felt like he was "dying". BP at home running as low as 90, and as high as 130. Weight has been stable. He has had 2 episodes of lightheadedness, but ICD interrogation negative. They are applying for [place] pt assistance for eplerenone as he is about to go in the donut hole. Took meds this am. Lasix 60 mg daily working well, does not feel like he has gained fluid. Still having occasional SOB. No orthopnea, PND, or edema. No CP.   Echo today shows LVEF 45-50%, Grade 1 DD, Mild AS, trivial AI, Mild MR, mild LAE.  Medtronic device interrogated: No further VT. Optivol normal. Thoracic impedence above threshold (normal). Pt activity trending up towards > 2 hrs.    Labs (1/19): LDL 80, HDL 42 Labs (4/19): K 3.7, creatinine 1.21, TSH normal Labs (5/19): K 4.3, creatinine 0.97 Labs (9/19): K 3.7, creatinine 1.26  ECG (personally reviewed):  a-paced, BiV paced  PMH: 1. Aortic insufficiency: Mild to moderate on most recent echo in 1/17.  2. HTN 3. B12 deficiency 4. Atrial tachycardia: S/p ablation in 1/19.  5. Hypothyroidism 6. Hyperlipidemia 7. Prior PE: Not currently on anticoagulation.  8. H/o VT: had CRT-D device, CRT-P since most recent generator change. .  9. CAD: h/o BMS to RCA.  - LHC (2/17) with mild nonobstructive CAD, patent RCA stent.  10. Complete heart block: Has Medtronic CRT-P device.   11. Chronic systolic CHF: Probably primarily nonischemic CMP as he does not have extensive enough CAD to explain CMP.  - Echo (2009): EF 20-25%.  - Echo (1/17): EF 55-60%, mild LV dilation, mild-moderate AI, normal RV size and systolic function.  - RHC (2/17): mean RA 10, PA 44/10, mean PCWP 12, CI 2.18.  - Echo (5/19): EF 40-45%, moderate LVH, mild-moderate AI, normal RV size and systolic function.  - Gynecomastia with spironolactone.  12. COPD: Severe emphysema on prior chest CT.  Smoked in past.   Review of systems complete and found to be negative unless listed in HPI.   Social History   Socioeconomic History  . Marital status: Married    Spouse name: Not on file  . Number of children: 2  . Years of education: Not on file  . Highest education level: Not on file  Occupational History  . Occupation:  plumber - self employed    Employer: RETIRED  Social Needs  . Financial resource strain: Not on file  . Food insecurity:    Worry: Not on file    Inability: Not on file  . Transportation needs:    Medical: Not on file    Non-medical: Not on file  Tobacco Use  . Smoking status: Former Smoker    Packs/day: 1.00    Years: 43.00    Pack years: 43.00    Types: Cigarettes    Start date: 10/17/1957    Last attempt to quit: 12/13/2000    Years since quitting: 17.6  . Smokeless tobacco: Never Used  . Tobacco comment: started at age 39  Substance and Sexual Activity  . Alcohol use: No    Alcohol/week: 0.0 standard  drinks  . Drug use: No  . Sexual activity: Not Currently  Lifestyle  . Physical activity:    Days per week: Not on file    Minutes per session: Not on file  . Stress: Not on file  Relationships  . Social connections:    Talks on phone: Not on file    Gets together: Not on file    Attends religious service: Not on file    Active member of club or organization: Not on file    Attends meetings of clubs or organizations: Not on file    Relationship status: Not on file  . Intimate partner violence:    Fear of current or ex partner: Not on file    Emotionally abused: Not on file    Physically abused: Not on file    Forced sexual activity: Not on file  Other Topics Concern  . Not on file  Social History Narrative   Self employed Development worker, community; 2 daughters.    Family History  Problem Relation Age of Onset  . Diabetes type I Sister        daughter   . Lung cancer Brother        several types of cancer  . Lung cancer Brother        several types of cancer  . Lung cancer Brother   . Clotting disorder Neg Hx        also no repiratory disease   . Prostate cancer Neg Hx    Current Outpatient Medications  Medication Sig Dispense Refill  . aspirin EC 81 MG tablet Take 81 mg by mouth at bedtime.    . bisoprolol (ZEBETA) 5 MG tablet Take 0.5 tablet (2.5 mg total) by mouth once a day. 30 tablet 3  . Calcium Carb-Cholecalciferol (CALCIUM 500 + D3 PO) Take by mouth.    . Cyanocobalamin (VITAMIN B-12 IJ) Inject as directed every 30 (thirty) days.    Marland Kitchen eplerenone (INSPRA) 25 MG tablet Take 0.5 tablet (12.5 mg total) by mouth daily. 45 tablet 3  . furosemide (LASIX) 20 MG tablet Take 3 tablets (60 mg total) by mouth every morning. 120 tablet 2  . levothyroxine (SYNTHROID, LEVOTHROID) 112 MCG tablet Take 1 tablet (112 mcg total) by mouth daily before breakfast. 90 tablet 2  . losartan (COZAAR) 25 MG tablet Take 0.5 tablets (12.5 mg total) by mouth daily. 30 tablet 11  . memantine (NAMENDA) 10 MG  tablet TAKE 1 TABLET BY MOUTH TWICE A DAY 180 tablet 4  . Polyethyl Glycol-Propyl Glycol (LUBRICANT EYE DROPS) 0.4-0.3 % SOLN Place 1-2 drops into both eyes 2 (two) times daily as needed (for dry/irritated eyes.).    Marland Kitchen  potassium chloride SA (K-DUR,KLOR-CON) 20 MEQ tablet Take 1 tablet (20 mEq total) by mouth daily. 90 tablet 3  . simvastatin (ZOCOR) 20 MG tablet Take 1 tablet (20 mg total) by mouth at bedtime. 90 tablet 3   No current facility-administered medications for this encounter.    Vitals:   08/16/18 1358  BP: 134/60  Pulse: 60  SpO2: 95%  Weight: 58.5 kg (129 lb)    Wt Readings from Last 3 Encounters:  08/16/18 58.5 kg (129 lb)  08/01/18 59.3 kg (130 lb 12.8 oz)  07/02/18 58.5 kg (129 lb)   General: Elderly appearing. No resp difficulty. HEENT: Normal Neck: Supple. JVP flat.. Carotids 2+ bilat; no bruits. No thyromegaly or nodule noted. Cor: PMI nondisplaced. RRR, No M/G/R noted Lungs: Diminished throughout.  Abdomen: Soft, non-tender, non-distended, no HSM. No bruits or masses. +BS  Extremities: No cyanosis, clubbing, or rash. R and LLE no edema.  Neuro: Alert & orientedx3, cranial nerves grossly intact. moves all 4 extremities w/o difficulty. Affect pleasant   Assessment/Plan: 1. Chronic systolic CHF:  - Initially, EF low in 20-25% range in 2009.  However, over time, EF improved to 55-60% in 1/17.  Echo in 5/19 with EF 40-45%.  Suspect primarily nonischemic cardiomyopathy as degree of coronary disease does not explain his initial low EF.  Patient has a Medtronic CRT-P device.  NYHA class III symptoms.  Symptoms are due mainly to his COPD as well as possible parkinsonism.  - Echo today with "stable" EF 45-50% from earlier this year.  - Volume stable on exam and optivol.  - Continue bisoprolol 12.5 mg daily.  - Continue losartan 12.5 mg, but move to bedtime.  - Continue lasix 60 mg daily.  - Continue eplerenone 12.5 mg qHS - Reinforced fluid restriction to < 2 L  daily, sodium restriction to less than 2000 mg daily, and the importance of daily weights.   2. COPD:  - His COPD appears quite severe.  He has had a hard time taking inhalers and recently stopped Duonebs.  Has been difficult to treat him given his side effects with COPD meds.  No change.  3. CAD:  - Cath in 2017 with nonobstructive disease, patent RCA stent.   - No s/s of ischemia.    - Continue statin, ASA 81.  4. Aortic insufficiency:  - Mild on echo 07/2018  5. H/o atrial tachycardia:  - S/p ablation by Dr. Lovena Le, no palpitations. No further arrhythmias on interrogation.  6. Complete heart block: - Stable with MDT CRT-P device.  7. Pulmonary hypertension:  - Noted on 2017 RHC, mild and likely group 3 from COPD. No change.  8. Parkinsonism:  - Concern for. He plans to see neurology.  9. Vtach  - No further. BMET, Mg today. Continue BB. EF stable.   He is feeling slowly better. No changes for now. Keep 3 week appt.   Shirley Friar, PA-C  08/16/2018  Greater than 50% of the 25 minute visit was spent in counseling/coordination of care regarding disease state education, salt/fluid restriction, sliding scale diuretics, and medication compliance.

## 2018-08-16 NOTE — Telephone Encounter (Signed)
Pt called no answer voice mail left TSH elevated, but T4 WNL. Likely not the cause of his symptoms. All other labs unremarkable.  Can follow up with whomever is managing his thyroid for further recommendations, but likely no changes needed

## 2018-08-16 NOTE — Patient Instructions (Signed)
Today you have been seen at the Heart failure clinic at [facility]   Medication changes:  Eplerone 25 mg daily  Take Losartan at night   You had Lab work done today:  We will call you if your lab work is abnormal.. No news is good news!!   Keep your follow up appointment   Do the following things EVERYDAY: 1) Weigh yourself in the morning before breakfast. Write it down and keep it in a log. 2) Take your medicines as prescribed 3) Eat low salt foods-Limit salt (sodium) to 2000 mg per day.  4) Stay as active as you can everyday 5) Limit all fluids for the day to less than 2 liters

## 2018-08-17 ENCOUNTER — Encounter (HOSPITAL_COMMUNITY): Payer: Self-pay

## 2018-08-17 ENCOUNTER — Other Ambulatory Visit (INDEPENDENT_AMBULATORY_CARE_PROVIDER_SITE_OTHER): Payer: Self-pay | Admitting: *Deleted

## 2018-08-17 ENCOUNTER — Telehealth: Payer: Self-pay

## 2018-08-17 DIAGNOSIS — Z1211 Encounter for screening for malignant neoplasm of colon: Secondary | ICD-10-CM

## 2018-08-17 NOTE — Addendum Note (Signed)
Encounter addended by: [name], LCSW on: 08/17/2018 7:37 AM  Actions taken: Visit Navigator Flowsheet section accepted

## 2018-08-17 NOTE — Telephone Encounter (Signed)
Pts wife called to let us know that [name] TSH was 7.482 at the cardiology clinic.  These are in Epic system. Pt is now taking Levothyroxine 146mcg qd.

## 2018-08-21 NOTE — Telephone Encounter (Signed)
Left message for pt to call back  °

## 2018-08-21 NOTE — Telephone Encounter (Signed)
Free T4 is at target. No change. Keep same dose until next labs.

## 2018-08-24 DIAGNOSIS — M159 Polyosteoarthritis, unspecified: Secondary | ICD-10-CM | POA: Diagnosis not present

## 2018-08-24 DIAGNOSIS — E7800 Pure hypercholesterolemia, unspecified: Secondary | ICD-10-CM | POA: Diagnosis not present

## 2018-08-24 DIAGNOSIS — J449 Chronic obstructive pulmonary disease, unspecified: Secondary | ICD-10-CM | POA: Diagnosis not present

## 2018-08-24 DIAGNOSIS — I2510 Atherosclerotic heart disease of native coronary artery without angina pectoris: Secondary | ICD-10-CM | POA: Diagnosis not present

## 2018-08-27 ENCOUNTER — Telehealth (HOSPITAL_COMMUNITY): Payer: Self-pay | Admitting: Pharmacist

## 2018-08-27 NOTE — Telephone Encounter (Addendum)
[employer] patient assistance approved for Inspra 25 mg daily through 10/17/19.   Order #: [id number]  Ruta Hinds. Velva Harman, PharmD, BCPS, CPP Clinical Pharmacist Phone: [phone] 08/27/2018 11:22 AM

## 2018-08-28 ENCOUNTER — Telehealth (HOSPITAL_COMMUNITY): Payer: Self-pay | Admitting: Surgery

## 2018-08-28 NOTE — Telephone Encounter (Signed)
I left a message on both phone numbers for patient to return call.  We have received his Inspra and have it available for him to pick up at [facility].

## 2018-08-29 NOTE — Addendum Note (Signed)
Encounter addended by: Shirley Friar, PA-C on: 08/29/2018 10:28 AM  Actions taken: LOS modified

## 2018-08-30 ENCOUNTER — Telehealth: Payer: Self-pay | Admitting: Cardiology

## 2018-08-30 ENCOUNTER — Ambulatory Visit (INDEPENDENT_AMBULATORY_CARE_PROVIDER_SITE_OTHER): Payer: Medicare Other

## 2018-08-30 DIAGNOSIS — Z950 Presence of cardiac pacemaker: Secondary | ICD-10-CM

## 2018-08-30 DIAGNOSIS — Z23 Encounter for immunization: Secondary | ICD-10-CM | POA: Diagnosis not present

## 2018-08-30 DIAGNOSIS — I5022 Chronic systolic (congestive) heart failure: Secondary | ICD-10-CM | POA: Diagnosis not present

## 2018-08-30 NOTE — Progress Notes (Signed)
EPIC Encounter for ICM Monitoring  Patient Name: [name] is a 76 y.o. male Date: 08/30/2018 Primary Care Physican: Glenda Chroman, MD Primary Cardiologist:[place] Electrophysiologist: Santina Evans Pacing:98.9% Last Weight: 127lbs  Today's Weight: 128 lbs       Spoke with wife.  Heart Failure questions reviewed, pt asymptomatic.   Thoracic impedance normal.   Prescribed: Furosemide20 mg 3 tablets (60 mg total)daily.  Labs: 08/16/2018 Creatinine 1.03, BUN 15, Potassium 4.1, Sodium 139, eGFR >60 08/01/2018 Creatinine 1.07, BUN 19, Potassium 4.0, Sodium 142, eGFR >60 06/21/2018 Creatinine 1.26, BUN 17, Potassium 3.7, Sodium 144, eGFR 54->60 06/11/2018 Creatinine 1.00, BUN 20, Potassium 4.0, Sodium 145, eGFR >60 03/16/2018 Creatinine 0.97, BUN 21, Potassium 4.3, Sodium 141, EGFR >60 A complete set of results can be found in Results Review.  Recommendations: No changes.  Encouraged to call for fluid symptoms.  Follow-up plan: ICM clinic phone appointment on 10/01/2018.   Office appointment scheduled 09/04/2018 with Dr. Aundra Dubin.    Copy of ICM check sent to Dr. Lovena Le.   3 month ICM trend: 08/30/2018    1 Year ICM trend:       Rosalene Billings, RN 08/30/2018 12:52 PM

## 2018-08-30 NOTE — Telephone Encounter (Signed)
LMOVM reminding pt to send remote transmission.   

## 2018-09-04 ENCOUNTER — Ambulatory Visit (HOSPITAL_COMMUNITY)
Admission: RE | Admit: 2018-09-04 | Discharge: 2018-09-04 | Disposition: A | Discharge status: Home / Self Care | Payer: Medicare Other | Source: Ambulatory Visit | Attending: Cardiology | Admitting: Cardiology

## 2018-09-04 ENCOUNTER — Encounter (HOSPITAL_COMMUNITY): Payer: Self-pay | Admitting: Cardiology

## 2018-09-04 VITALS — BP 126/64 | HR 62 | Wt 130.6 lb

## 2018-09-04 DIAGNOSIS — I272 Pulmonary hypertension, unspecified: Secondary | ICD-10-CM | POA: Diagnosis not present

## 2018-09-04 DIAGNOSIS — Z7982 Long term (current) use of aspirin: Secondary | ICD-10-CM | POA: Diagnosis not present

## 2018-09-04 DIAGNOSIS — Z86711 Personal history of pulmonary embolism: Secondary | ICD-10-CM | POA: Insufficient documentation

## 2018-09-04 DIAGNOSIS — Z79899 Other long term (current) drug therapy: Secondary | ICD-10-CM | POA: Diagnosis not present

## 2018-09-04 DIAGNOSIS — I350 Nonrheumatic aortic (valve) stenosis: Secondary | ICD-10-CM | POA: Insufficient documentation

## 2018-09-04 DIAGNOSIS — E039 Hypothyroidism, unspecified: Secondary | ICD-10-CM | POA: Diagnosis not present

## 2018-09-04 DIAGNOSIS — Z955 Presence of coronary angioplasty implant and graft: Secondary | ICD-10-CM | POA: Diagnosis not present

## 2018-09-04 DIAGNOSIS — Z87891 Personal history of nicotine dependence: Secondary | ICD-10-CM | POA: Diagnosis not present

## 2018-09-04 DIAGNOSIS — Z950 Presence of cardiac pacemaker: Secondary | ICD-10-CM | POA: Insufficient documentation

## 2018-09-04 DIAGNOSIS — I2510 Atherosclerotic heart disease of native coronary artery without angina pectoris: Secondary | ICD-10-CM | POA: Insufficient documentation

## 2018-09-04 DIAGNOSIS — J449 Chronic obstructive pulmonary disease, unspecified: Secondary | ICD-10-CM | POA: Insufficient documentation

## 2018-09-04 DIAGNOSIS — Z79890 Hormone replacement therapy (postmenopausal): Secondary | ICD-10-CM | POA: Insufficient documentation

## 2018-09-04 DIAGNOSIS — I442 Atrioventricular block, complete: Secondary | ICD-10-CM | POA: Diagnosis not present

## 2018-09-04 DIAGNOSIS — I110 Hypertensive heart disease with heart failure: Secondary | ICD-10-CM | POA: Insufficient documentation

## 2018-09-04 DIAGNOSIS — I472 Ventricular tachycardia, unspecified: Secondary | ICD-10-CM

## 2018-09-04 DIAGNOSIS — I5022 Chronic systolic (congestive) heart failure: Secondary | ICD-10-CM | POA: Diagnosis not present

## 2018-09-04 DIAGNOSIS — I471 Supraventricular tachycardia: Secondary | ICD-10-CM | POA: Diagnosis not present

## 2018-09-04 DIAGNOSIS — E785 Hyperlipidemia, unspecified: Secondary | ICD-10-CM | POA: Diagnosis not present

## 2018-09-04 LAB — BASIC METABOLIC PANEL
ANION GAP: 9 (ref 5–15)
BUN: 16 mg/dL (ref 8–23)
CHLORIDE: 103 mmol/L (ref 98–111)
CO2: 27 mmol/L (ref 22–32)
Calcium: 9.2 mg/dL (ref 8.9–10.3)
Creatinine, Ser: 1.06 mg/dL (ref 0.61–1.24)
Glucose, Bld: 86 mg/dL (ref 70–99)
POTASSIUM: 4 mmol/L (ref 3.5–5.1)
SODIUM: 139 mmol/L (ref 135–145)

## 2018-09-04 LAB — MAGNESIUM: MAGNESIUM: 2.1 mg/dL (ref 1.7–2.4)

## 2018-09-04 NOTE — Progress Notes (Signed)
Advanced Heart Failure Clinic Note  PCP: Dr. Woody Seller EP: Dr. Lovena Le  HF Cardiology: Dr. Aundra Dubin  77 y.o. referred by Dr. Lovena Le for evaluation of CHF. Patient has a long cardiac history.  He had a prior PCI to the RCA with BMS.  Most recent cath in 2/17 showed nonobstructive CAD, patent RCA stent.  In 2009, echo showed EF 20-25%.  He went on to have ICD placed and later, CRT-P when he developed complete heart block requiring CRT upgrade (no longer has defibrillator). Echo in 1/17 showed EF improved to 55-60%. Echo in 5/19 showed EF 40-45%, moderate LVH, mild-moderate AI, normal RV.    Patient has been seeing pulmonary.  He does not appear to have been very compliant with inhalers.  He was started on Duonebs by Dr. Lenna Gilford but stopped it because it was "messing up my eyes."  He is currently on no regular COPD meds.    Echo in 10/19 showed EF up to 45-50% with mild AS.   At last appointment, he was orthostatic.  I cut back on his BP-active meds. He seems to be doing better on lower doses.  Only rarely getting lightheaded.  No dyspnea walking in house but gets short of breath with longer distances.  No chest pain.  He has started Symbicort and is tolerating it.  He continues to have trouble with anxiety.   Labs (1/19): LDL 80, HDL 42 Labs (4/19): K 3.7, creatinine 1.21, TSH normal Labs (5/19): K 4.3, creatinine 0.97 Labs (9/19): K 3.7, creatinine 1.26 Labs (10/19): K 4.1, creatinine 1.03, hgb 14.7, TSH 7.4, free T4 normal.   PMH: 1. Aortic insufficiency: Mild to moderate on most recent echo in 1/17.  2. HTN 3. B12 deficiency 4. Atrial tachycardia: S/p ablation in 1/19.  5. Hypothyroidism 6. Hyperlipidemia 7. Prior PE: Not currently on anticoagulation.  8. H/o VT: had CRT-D device, CRT-P since most recent generator change. .  9. CAD: h/o BMS to RCA.  - LHC (2/17) with mild nonobstructive CAD, patent RCA stent.  10. Complete heart block: Has Medtronic CRT-P device.   11. Chronic systolic CHF:  Probably primarily nonischemic CMP as he does not have extensive enough CAD to explain CMP.  - Echo (2009): EF 20-25%.  - Echo (1/17): EF 55-60%, mild LV dilation, mild-moderate AI, normal RV size and systolic function.  - RHC (2/17): mean RA 10, PA 44/10, mean PCWP 12, CI 2.18.  - Echo (5/19): EF 40-45%, moderate LVH, mild-moderate AI, normal RV size and systolic function.  - Gynecomastia with spironolactone.  - Echo (10/19): EF 45-50%, mild AS.  12. COPD: Severe emphysema on prior chest CT.  Smoked in past.   Review of systems complete and found to be negative unless listed in HPI.   Social History   Socioeconomic History  . Marital status: Married    Spouse name: Not on file  . Number of children: 2  . Years of education: 10  . Highest education level: Not on file  Occupational History  . Occupation: Development worker, community - self employed    Employer: RETIRED  Social Needs  . Financial resource strain: Not hard at all  . Food insecurity:    Worry: Never true    Inability: Never true  . Transportation needs:    Medical: No    Non-medical: No  Tobacco Use  . Smoking status: Former Smoker    Packs/day: 1.00    Years: 43.00    Pack years: 43.00    Types:  Cigarettes    Start date: 10/17/1957    Last attempt to quit: 12/13/2000    Years since quitting: 17.7  . Smokeless tobacco: Never Used  . Tobacco comment: started at age 32  Substance and Sexual Activity  . Alcohol use: No    Alcohol/week: 0.0 standard drinks  . Drug use: No  . Sexual activity: Not Currently  Lifestyle  . Physical activity:    Days per week: Not on file    Minutes per session: Not on file  . Stress: Not on file  Relationships  . Social connections:    Talks on phone: Not on file    Gets together: Not on file    Attends religious service: Not on file    Active member of club or organization: Not on file    Attends meetings of clubs or organizations: Not on file    Relationship status: Not on file  . Intimate  partner violence:    Fear of current or ex partner: Not on file    Emotionally abused: Not on file    Physically abused: Not on file    Forced sexual activity: Not on file  Other Topics Concern  . Not on file  Social History Narrative   Self employed Development worker, community; 2 daughters.    Family History  Problem Relation Age of Onset  . Diabetes type I Sister        daughter   . Lung cancer Brother        several types of cancer  . Lung cancer Brother        several types of cancer  . Lung cancer Brother   . Clotting disorder Neg Hx        also no repiratory disease   . Prostate cancer Neg Hx    Review of systems complete and found to be negative unless listed in HPI.   Current Outpatient Medications  Medication Sig Dispense Refill  . aspirin EC 81 MG tablet Take 81 mg by mouth at bedtime.    . bisoprolol (ZEBETA) 5 MG tablet Take 0.5 tablet (2.5 mg total) by mouth once a day. 30 tablet 3  . Calcium Carb-Cholecalciferol (CALCIUM 500 + D3 PO) Take by mouth.    . Cyanocobalamin (VITAMIN B-12 IJ) Inject as directed every 30 (thirty) days.    Marland Kitchen eplerenone (INSPRA) 25 MG tablet Take 1 tablet (25 mg total) by mouth daily. Take at bed time 45 tablet 3  . furosemide (LASIX) 20 MG tablet Take 3 tablets (60 mg total) by mouth every morning. 120 tablet 2  . levothyroxine (SYNTHROID, LEVOTHROID) 112 MCG tablet Take 1 tablet (112 mcg total) by mouth daily before breakfast. (Patient taking differently: Take 125 mcg by mouth daily before breakfast. ) 90 tablet 2  . losartan (COZAAR) 25 MG tablet Take 0.5 tablets (12.5 mg total) by mouth at bedtime. 30 tablet 11  . memantine (NAMENDA) 10 MG tablet TAKE 1 TABLET BY MOUTH TWICE A DAY 180 tablet 4  . Polyethyl Glycol-Propyl Glycol (LUBRICANT EYE DROPS) 0.4-0.3 % SOLN Place 1-2 drops into both eyes 2 (two) times daily as needed (for dry/irritated eyes.).    Marland Kitchen potassium chloride SA (K-DUR,KLOR-CON) 20 MEQ tablet Take 1 tablet (20 mEq total) by mouth daily. 90  tablet 3  . simvastatin (ZOCOR) 20 MG tablet Take 1 tablet (20 mg total) by mouth at bedtime. 90 tablet 3   No current facility-administered medications for this encounter.    BP 126/64  Pulse 62   Wt 59.2 kg (130 lb 9.6 oz)   SpO2 100%   BMI 19.29 kg/m   Wt Readings from Last 3 Encounters:  09/04/18 59.2 kg (130 lb 9.6 oz)  08/16/18 58.5 kg (129 lb)  08/01/18 59.3 kg (130 lb 12.8 oz)   General: NAD Neck: No JVD, no thyromegaly or thyroid nodule.  Lungs: Distant breath sounds.  CV: Nondisplaced PMI.  Heart regular S1/S2, no S3/S4, no murmur.  No peripheral edema.  No carotid bruit.  Normal pedal pulses.  Abdomen: Soft, nontender, no hepatosplenomegaly, no distention.  Skin: Intact without lesions or rashes.  Neurologic: Alert and oriented x 3.  Psych: Normal affect. Extremities: No clubbing or cyanosis.  HEENT: Normal.   Assessment/Plan: 1. Chronic systolic CHF: Initially, EF low in 20-25% range in 2009.  However, over time, EF improved to 55-60% in 1/17.  Echo in 5/19 with EF 40-45%.  Echo in 10/19 with EF up to 45-50%.  Suspect primarily nonischemic cardiomyopathy as degree of coronary disease does not explain his initial low EF.  Patient has a Medtronic CRT-P device.  NYHA class III symptoms.  Symptoms are due mainly to his COPD.  He is not volume overloaded by exam today. No longer lightheaded after cutting back on meds.   - Continue bisoprolol 2.5 mg daily.  - Continue losartan 12.5 mg daily.   - Continue Lasix 60 mg daily - Continue eplerenone 25 mg daily.   - BMET today.  2. COPD: His COPD appears quite severe.  He is now on Symbicort.  3. CAD: Cath in 2017 with nonobstructive disease, patent RCA stent. No chest pain.  - Continue statin, ASA 81.  4. Aortic stenosis: Mild on last echo.  5. H/o atrial tachycardia: S/p ablation by Dr. Lovena Le, no palpitations.  6. Complete heart block: He has MDT CRT-P device.  7. Pulmonary hypertension: Noted on 2017 RHC, mild and likely  group 3 from COPD. No change.  8. VT: Continue bisoprolol.   Followup in 3 months.   Loralie Champagne 09/04/2018

## 2018-09-04 NOTE — Patient Instructions (Signed)
Labs today We will only contact you if something comes back abnormal or we need to make some changes. Otherwise no news is good news!  Your physician recommends that you schedule a follow-up appointment in: 3 months  

## 2018-09-10 ENCOUNTER — Telehealth (HOSPITAL_COMMUNITY): Payer: Self-pay | Admitting: Pharmacist

## 2018-09-10 NOTE — Telephone Encounter (Signed)
[employer] patient assistance approved for Inspra 25 mg daily through 10/16/18. It will be shipped to our HF clinic for no charge to patient.   Ruta Hinds. Velva Harman, PharmD, BCPS, CPP Clinical Pharmacist Phone: [phone] 09/10/2018 2:03 PM

## 2018-09-17 ENCOUNTER — Encounter (HOSPITAL_COMMUNITY): Payer: Medicare Other

## 2018-09-24 DIAGNOSIS — Z299 Encounter for prophylactic measures, unspecified: Secondary | ICD-10-CM | POA: Diagnosis not present

## 2018-09-24 DIAGNOSIS — I10 Essential (primary) hypertension: Secondary | ICD-10-CM | POA: Diagnosis not present

## 2018-09-24 DIAGNOSIS — E039 Hypothyroidism, unspecified: Secondary | ICD-10-CM | POA: Diagnosis not present

## 2018-09-24 DIAGNOSIS — J449 Chronic obstructive pulmonary disease, unspecified: Secondary | ICD-10-CM | POA: Diagnosis not present

## 2018-09-24 DIAGNOSIS — Z6820 Body mass index (BMI) 20.0-20.9, adult: Secondary | ICD-10-CM | POA: Diagnosis not present

## 2018-09-26 ENCOUNTER — Encounter (HOSPITAL_COMMUNITY): Payer: Self-pay

## 2018-09-26 ENCOUNTER — Encounter (HOSPITAL_COMMUNITY)
Admission: RE | Admit: 2018-09-26 | Discharge: 2018-09-26 | Disposition: A | Discharge status: Home / Self Care | Payer: Medicare Other | Source: Ambulatory Visit | Attending: Internal Medicine | Admitting: Internal Medicine

## 2018-09-26 VITALS — BP 118/60 | HR 60 | Ht 69.0 in | Wt 132.9 lb

## 2018-09-26 DIAGNOSIS — I5022 Chronic systolic (congestive) heart failure: Secondary | ICD-10-CM | POA: Diagnosis not present

## 2018-09-26 NOTE — Progress Notes (Signed)
Cardiac/Pulmonary Rehab Medication Review by a Pharmacist  Does the patient  feel that his/her medications are working for him/her?  yes  Has the patient been experiencing any side effects to the medications prescribed?  no  Does the patient measure his/her own blood pressure or blood glucose at home?  yes   Does the patient have any problems obtaining medications due to transportation or finances?   no  Understanding of regimen: good Understanding of indications: excellent Potential of compliance: excellent  Questions asked to Determine Patient Understanding of Medication Regimen:  1. What is the name of the medication?  2. What is the medication used for?  3. When should it be taken?  4. How much should be taken?  5. How will you take it?  6. What side effects should you report?  Understanding Defined as: Excellent: All questions above are correct Good: Questions 1-4 are correct Fair: Questions 1-2 are correct  Poor: 1 or none of the above questions are correct   Pharmacist comments: Patient presents with wife for pulmonary rehab. Reviewed medications with them. He is compliant with his regimen and tolerating. He has been a little jittery per wife, still trying to regulate his thyroid. Monitors his BP daily and prn. There were some recent medication changes with BP meds and his energy level has increased. Continue current regimen.  Thanks for the opportunity to participate in the care of this patient,  Isac Sarna, BS [place], [place] Clinical Pharmacist Pager [phone] 09/26/2018 8:40 AM

## 2018-09-26 NOTE — Progress Notes (Signed)
Pulmonary Individual Treatment Plan  Patient Details  Name: [name] MRN: [id number] Date of Birth: [birth year] Referring Provider:     PULMONARY REHAB OTHER RESP ORIENTATION from 09/26/2018 in [place]  Referring Provider  Vyas      Initial Encounter Date:    PULMONARY REHAB OTHER RESP ORIENTATION from 09/26/2018 in [place]  Date  09/26/18      Visit Diagnosis: Chronic systolic CHF (congestive heart failure) (HCC)  Patient's Home Medications on Admission:   Current Outpatient Medications:  .  predniSONE (DELTASONE) 5 MG tablet, Take 2.5 mg by mouth 2 (two) times daily with a meal., Disp: , Rfl:  .  aspirin EC 81 MG tablet, Take 81 mg by mouth at bedtime., Disp: , Rfl:  .  bisoprolol (ZEBETA) 5 MG tablet, Take 0.5 tablet (2.5 mg total) by mouth once a day., Disp: 30 tablet, Rfl: 3 .  Calcium Carb-Cholecalciferol (CALCIUM 500 + D3 PO), Take by mouth., Disp: , Rfl:  .  Cyanocobalamin (VITAMIN B-12 IJ), Inject as directed every 30 (thirty) days., Disp: , Rfl:  .  eplerenone (INSPRA) 25 MG tablet, Take 1 tablet (25 mg total) by mouth daily. Take at bed time, Disp: 45 tablet, Rfl: 3 .  fluticasone (FLONASE) 50 MCG/ACT nasal spray, Place 1 spray into both nostrils daily as needed., Disp: , Rfl:  .  furosemide (LASIX) 20 MG tablet, Take 3 tablets (60 mg total) by mouth every morning., Disp: 120 tablet, Rfl: 2 .  levofloxacin (LEVAQUIN) 500 MG tablet, Take 500 mg by mouth daily., Disp: , Rfl: 0 .  levothyroxine (SYNTHROID, LEVOTHROID) 112 MCG tablet, Take 1 tablet (112 mcg total) by mouth daily before breakfast. (Patient taking differently: Take 125 mcg by mouth daily before breakfast. ), Disp: 90 tablet, Rfl: 2 .  losartan (COZAAR) 25 MG tablet, Take 0.5 tablets (12.5 mg total) by mouth at bedtime., Disp: 30 tablet, Rfl: 11 .  memantine (NAMENDA) 10 MG tablet, TAKE 1 TABLET BY MOUTH TWICE A DAY (Patient not taking: Reported on  09/26/2018), Disp: 180 tablet, Rfl: 4 .  Polyethyl Glycol-Propyl Glycol (LUBRICANT EYE DROPS) 0.4-0.3 % SOLN, Place 1-2 drops into both eyes 2 (two) times daily as needed (for dry/irritated eyes.)., Disp: , Rfl:  .  potassium chloride SA (K-DUR,KLOR-CON) 20 MEQ tablet, Take 1 tablet (20 mEq total) by mouth daily., Disp: 90 tablet, Rfl: 3 .  simvastatin (ZOCOR) 20 MG tablet, Take 1 tablet (20 mg total) by mouth at bedtime., Disp: 90 tablet, Rfl: 3  Past Medical History: Past Medical History:  Diagnosis Date  . AICD (automatic cardioverter/defibrillator) present   . Aortic insufficiency     moderate aortic ins moderate/asymptomatic/normal LV cavity size.   . Carotid artery disease ([place])     less than 50% stenosis bilaterally  . Carotid artery disease ([place])   . Chronic systolic CHF (congestive heart failure) ([place])   . Coronary artery disease   . Drug-induced gynecomastia     secondary to spironolactone  . Dyslipidemia   . Hypertension   . ICD (implantable cardiac defibrillator), biventricular, in situ     [employer] model  [id number]  . Ischemic cardiomyopathy     status post non-ST elevation microinfarction stent to the right coronary artery is a   . LV dysfunction     NYHA class I/prior ejection fraction 20-25% and an ejection fraction 7 2009 50-55%, ejection fraction 60% bedside echocardiogram July 2012   . PONV (postoperative nausea and  vomiting)   . Pulmonary embolism (HCC)    Prior history of pulmonary embolism off Coumadin.  . 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 Packs/day: 1.00  . Years: 43.00  . Pack years: 43.00  . Types: Cigarettes  . Start date: 10/17/1957  . Last attempt to quit: 12/13/2000  . Years since quitting: 17.7  Smokeless Tobacco Never Used  Tobacco Comment    started at age 62    Labs: Recent Review Flowsheet Data    Labs for ITP Cardiac and Pulmonary Rehab Latest Ref Rng & Units 12/10/2015 12/10/2015 10/08/2016   HCO3 20.0 - 24.0 mEq/L 31.0(H) 28.9(H) -   TCO2 0 - 100 mmol/L 33 30 33   O2SAT % 64.0 96.0 -      Capillary Blood Glucose: Lab Results  Component Value Date   GLUCAP 88 02/03/2011   GLUCAP 81 01/31/2011     Pulmonary Assessment Scores: Pulmonary Assessment Scores    Row Name 09/26/18 1106         ADL UCSD   SOB Score total  26     Rest  0     Walk  7     Stairs  4     Bath  0     Dress  0     Shop  1       CAT Score   CAT Score  23       mMRC Score   mMRC Score  4        Pulmonary Function Assessment:   Exercise Target Goals: Exercise Program Goal: Individual exercise prescription set using results from initial 6 min walk test and THRR while considering  patient's activity barriers and safety.   Exercise Prescription Goal: Initial exercise prescription builds to 30-45 minutes a day of aerobic activity, 2-3 days per week.  Home exercise guidelines will be given to patient during program as part of exercise prescription that the participant will acknowledge.  Activity Barriers & Risk Stratification: Activity Barriers & Cardiac Risk Stratification - 09/26/18 1044      Activity Barriers & Cardiac Risk Stratification   Activity Barriers  Muscular Weakness;Shortness of Breath;Other (comment)    Comments  L knee pain due to gout     Cardiac Risk Stratification  High       6 Minute Walk: 6 Minute Walk    Row Name 09/26/18 1043         6 Minute Walk   Phase  Initial     Distance  1150 feet     Walk Time  6 minutes     # of Rest Breaks  0     MPH  2.18     METS  2.67     RPE  9     Perceived Dyspnea   13     VO2 Peak  9.03     Symptoms  No     Resting HR  60 bpm     Resting BP  118/60     Resting Oxygen Saturation   95 %     Exercise Oxygen Saturation  during 6 min walk  91 %     Max Ex.  HR  75 bpm     Max Ex. BP  130/60  2 Minute Post BP  116/60        Oxygen Initial Assessment: Oxygen Initial Assessment - 09/26/18 1105      Home Oxygen   Home Oxygen Device  None       Oxygen Re-Evaluation:   Oxygen Discharge (Final Oxygen Re-Evaluation):   Initial Exercise Prescription: Initial Exercise Prescription - 09/26/18 1000      Date of Initial Exercise RX and Referring Provider   Date  09/26/18    Referring Provider  Vyas    Expected Discharge Date  12/26/18      Treadmill   MPH  0.8    Grade  0    Minutes  17    METs  1.61      NuStep   Level  1    SPM  45    Minutes  22    METs  1.6      Prescription Details   Frequency (times per week)  2    Duration  Progress to 30 minutes of continuous aerobic without signs/symptoms of physical distress      Intensity   THRR 40-80% of Max Heartrate  93-110-127    Ratings of Perceived Exertion  11-13    Perceived Dyspnea  0-4      Progression   Progression  Continue to progress workloads to maintain intensity without signs/symptoms of physical distress.      Resistance Training   Training Prescription  Yes    Weight  1    Reps  10-15       Perform Capillary Blood Glucose checks as needed.  Exercise Prescription Changes:  Exercise Prescription Changes    Row Name 09/26/18 1000             Response to Exercise   Blood Pressure (Admit)  118/60       Blood Pressure (Exercise)  130/60       Blood Pressure (Exit)  116/60       Heart Rate (Admit)  60 bpm       Heart Rate (Exercise)  75 bpm       Heart Rate (Exit)  64 bpm       Oxygen Saturation (Admit)  95 %       Oxygen Saturation (Exercise)  91 %       Oxygen Saturation (Exit)  95 %       Rating of Perceived Exertion (Exercise)  9       Perceived Dyspnea (Exercise)  13       Comments  6 minute walk test       Duration  Progress to 30 minutes of  aerobic without signs/symptoms of physical distress       Intensity  THRR New [id number]          Home Exercise Plan   Plans to continue exercise at  Home (comment) walking       Frequency  Add 3 additional days to program exercise sessions.       Initial Home Exercises Provided  09/26/18          Exercise Comments:   Exercise Goals and Review:  Exercise Goals    Row Name 09/26/18 1046             Exercise Goals   Increase Physical Activity  Yes       Intervention  Provide advice, education, support and counseling about physical activity/exercise needs.;Develop an individualized exercise prescription for aerobic and resistive training  based on initial evaluation findings, risk stratification, comorbidities and participant's personal goals.       Expected Outcomes  Short Term: Attend rehab on a regular basis to increase amount of physical activity.       Increase Strength and Stamina  Yes       Intervention  Provide advice, education, support and counseling about physical activity/exercise needs.;Develop an individualized exercise prescription for aerobic and resistive training based on initial evaluation findings, risk stratification, comorbidities and participant's personal goals.       Expected Outcomes  Short Term: Increase workloads from initial exercise prescription for resistance, speed, and METs.       Able to understand and use rate of perceived exertion (RPE) scale  Yes       Intervention  Provide education and explanation on how to use RPE scale       Expected Outcomes  Short Term: Able to use RPE daily in rehab to express subjective intensity level;Long Term:  Able to use RPE to guide intensity level when exercising independently       Able to understand and use Dyspnea scale  Yes       Intervention  Provide education and explanation on how to use Dyspnea scale       Expected Outcomes  Short Term: Able to use Dyspnea scale daily in rehab to express subjective sense of shortness of breath during exertion;Long Term: Able to use Dyspnea scale to guide intensity level  when exercising independently       Knowledge and understanding of Target Heart Rate Range (THRR)  Yes       Intervention  Provide education and explanation of THRR including how the numbers were predicted and where they are located for reference       Expected Outcomes  Short Term: Able to state/look up THRR       Able to check pulse independently  Yes       Intervention  Review the importance of being able to check your own pulse for safety during independent exercise       Expected Outcomes  Short Term: Able to explain why pulse checking is important during independent exercise;Long Term: Able to check pulse independently and accurately       Understanding of Exercise Prescription  Yes       Intervention  Provide education, explanation, and written materials on patient's individual exercise prescription       Expected Outcomes  Short Term: Able to explain program exercise prescription;Long Term: Able to explain home exercise prescription to exercise independently          Exercise Goals Re-Evaluation :   Discharge Exercise Prescription (Final Exercise Prescription Changes): Exercise Prescription Changes - 09/26/18 1000      Response to Exercise   Blood Pressure (Admit)  118/60    Blood Pressure (Exercise)  130/60    Blood Pressure (Exit)  116/60    Heart Rate (Admit)  60 bpm    Heart Rate (Exercise)  75 bpm    Heart Rate (Exit)  64 bpm    Oxygen Saturation (Admit)  95 %    Oxygen Saturation (Exercise)  91 %    Oxygen Saturation (Exit)  95 %    Rating of Perceived Exertion (Exercise)  9    Perceived Dyspnea (Exercise)  13    Comments  6 minute walk test    Duration  Progress to 30 minutes of  aerobic without signs/symptoms of physical distress  Intensity  THRR New   93-110-127     Home Exercise Plan   Plans to continue exercise at  Home (comment)   walking   Frequency  Add 3 additional days to program exercise sessions.    Initial Home Exercises Provided  09/26/18        Nutrition:  Target Goals: Understanding of nutrition guidelines, daily intake of sodium 1500mg , cholesterol 200mg , calories 30% from fat and 7% or less from saturated fats, daily to have 5 or more servings of fruits and vegetables.  Biometrics: Pre Biometrics - 09/26/18 1046      Pre Biometrics   Height  5\' 9"  (1.753 m)    Weight  132 lb 15 oz (60.3 kg)    Waist Circumference  31.5 inches    Hip Circumference  34 inches    Waist to Hip Ratio  0.93 %    BMI (Calculated)  19.62    Triceps Skinfold  4 mm    % Body Fat  15.7 %    Grip Strength  14.7 kg    Flexibility  0 in    Single Leg Stand  3.09 seconds        Nutrition Therapy Plan and Nutrition Goals: Nutrition Therapy & Goals - 09/26/18 1110      Personal Nutrition Goals   Personal Goal #2  Is following his low sodium, heart healthy diet.     Additional Goals?  No       Nutrition Assessments: Nutrition Assessments - 09/26/18 1111      MEDFICTS Scores   Pre Score  15       Nutrition Goals Re-Evaluation:   Nutrition Goals Discharge (Final Nutrition Goals Re-Evaluation):   Psychosocial: Target Goals: Acknowledge presence or absence of significant depression and/or stress, maximize coping skills, provide positive support system. Participant is able to verbalize types and ability to use techniques and skills needed for reducing stress and depression.  Initial Review & Psychosocial Screening: Initial Psych Review & Screening - 09/26/18 1108      Initial Review   Current issues with  Current Depression   His depression is mild and concering his recent health problems because he can't work     [place]?  Yes      Barriers   Psychosocial barriers to participate in program  Psychosocial barriers identified (see note)      Screening Interventions   Interventions  Encouraged to exercise    Expected Outcomes  Short Term goal: Identification and review with participant of any  Quality of Life or Depression concerns found by scoring the questionnaire.;Long Term goal: The participant improves quality of Life and PHQ9 Scores as seen by post scores and/or verbalization of changes       Quality of Life Scores: Quality of Life - 09/26/18 1047      Quality of Life   Select  Quality of Life      Quality of Life Scores   Health/Function Pre  13.71 %    Socioeconomic Pre  19.71 %    Psych/Spiritual Pre  23.14 %    Family Pre  30 %    GLOBAL Pre  19.45 %      Scores of 19 and below usually indicate a poorer quality of life in these areas.  A difference of  2-3 points is a clinically meaningful difference.  A difference of 2-3 points in the total score of the Quality of Life Index  has been associated with significant improvement in overall quality of life, self-image, physical symptoms, and general health in studies assessing change in quality of life.   PHQ-9: Recent Review Flowsheet Data    Depression screen [facility] 2/9 09/26/2018 04/26/2018   Decreased Interest 0 0   Down, Depressed, Hopeless 1 0   PHQ - 2 Score 1 0   Altered sleeping 0 -   Tired, decreased energy 1 -   Change in appetite 0 -   Feeling bad or failure about yourself  0 -   Trouble concentrating 0 -   Moving slowly or fidgety/restless 0 -   Suicidal thoughts 0 -   PHQ-9 Score 2 -   Difficult doing work/chores Not difficult at all -     Interpretation of Total Score  Total Score Depression Severity:  1-4 = Minimal depression, 5-9 = Mild depression, 10-14 = Moderate depression, 15-19 = Moderately severe depression, 20-27 = Severe depression   Psychosocial Evaluation and Intervention: Psychosocial Evaluation - 09/26/18 1110      Psychosocial Evaluation & Interventions   Interventions  Encouraged to exercise with the program and follow exercise prescription    Continue Psychosocial Services   Follow up required by staff       Psychosocial Re-Evaluation:   Psychosocial Discharge (Final  Psychosocial Re-Evaluation):    Education: Education Goals: Education classes will be provided on a weekly basis, covering required topics. Participant will state understanding/return demonstration of topics presented.  Learning Barriers/Preferences: Learning Barriers/Preferences - 09/26/18 1020      Learning Barriers/Preferences   Learning Barriers  None    Learning Preferences  Written Material;Pictoral;Video       Education Topics: How Lungs Work and Diseases: - Discuss the anatomy of the lungs and diseases that can affect the lungs, such as COPD.   Exercise: -Discuss the importance of exercise, FITT principles of exercise, normal and abnormal responses to exercise, and how to exercise safely.   Environmental Irritants: -Discuss types of environmental irritants and how to limit exposure to environmental irritants.   Meds/Inhalers and oxygen: - Discuss respiratory medications, definition of an inhaler and oxygen, and the proper way to use an inhaler and oxygen.   Energy Saving Techniques: - Discuss methods to conserve energy and decrease shortness of breath when performing activities of daily living.    Bronchial Hygiene / Breathing Techniques: - Discuss breathing mechanics, pursed-lip breathing technique,  proper posture, effective ways to clear airways, and other functional breathing techniques   Cleaning Equipment: - Provides group verbal and written instruction about the health risks of elevated stress, cause of high stress, and healthy ways to reduce stress.   Nutrition I: Fats: - Discuss the types of cholesterol, what cholesterol does to the body, and how cholesterol levels can be controlled.   Nutrition II: Labels: -Discuss the different components of food labels and how to read food labels.   Respiratory Infections: - Discuss the signs and symptoms of respiratory infections, ways to prevent respiratory infections, and the importance of seeking medical  treatment when having a respiratory infection.   Stress I: Signs and Symptoms: - Discuss the causes of stress, how stress may lead to anxiety and depression, and ways to limit stress.   Stress II: Relaxation: -Discuss relaxation techniques to limit stress.   Oxygen for Home/Travel: - Discuss how to prepare for travel when on oxygen and proper ways to transport and store oxygen to ensure safety.   Knowledge Questionnaire Score: Knowledge Questionnaire Score -  09/26/18 1111      Knowledge Questionnaire Score   Pre Score  14/18       Core Components/Risk Factors/Patient Goals at Admission: Personal Goals and Risk Factors at Admission - 09/26/18 1112      Core Components/Risk Factors/Patient Goals on Admission    Weight Management  Weight Maintenance    Personal Goal Other  Yes    Personal Goal  Breathe better, Get stronger    Intervention  Attend program 2 x week and supplement with at home exercise 3 x week    Expected Outcomes  Reach personal goals.        Core Components/Risk Factors/Patient Goals Review:    Core Components/Risk Factors/Patient Goals at Discharge (Final Review):    ITP Comments: ITP Comments    Row Name 09/26/18 1101           ITP Comments  Patient was reccomended by Dr. Woody Seller due to CHF. Due to patients EF being above 35% we have had to place him in our pulmonary program.          Comments: Patient arrived for 1st visit/orientation/education at 0800. Patient was referred to PR by Dr. Woody Seller due to Chronic Systolic CHF (L24.40). During orientation advised patient on arrival and appointment times what to wear, what to do before, during and after exercise. Reviewed attendance and class policy. Talked about inclement weather and class consultation policy. Pt is scheduled to return Pulmonary Rehab on 10/02/18 at 1045.  Pt was advised to come to class 15 minutes before class starts. Patient was also given instructions on meeting with the dietician and  attending the Family Structure classes. Discussed RPE/Dpysnea scales. Discussed initial THR and how to find their radial and/or carotid pulse. Discussed the initial exercise prescription and how this effects their progress. Pt is eager to get started. Patient participated in warm up stretches followed by light weights and resistance bands. Patient was able to complete 6 minute walk test. Patient did not c/o pain. Patient was measured for the equipment. Discussed equipment safety with patient. Took patient pre-anthropometric measurements. Patient finished visit at 1030.

## 2018-09-26 NOTE — Progress Notes (Signed)
Daily Session Note  Patient Details  Name: [name] MRN: [id number] Date of Birth: 1941-07-23 Referring Provider:     PULMONARY REHAB OTHER RESP ORIENTATION from 09/26/2018 in [place]  Referring Provider  Vyas      Encounter Date: 09/26/2018  Check In: Session Check In - 09/26/18 0800      Check-In   Supervising physician immediately available to respond to emergencies  See telemetry face sheet for immediately available MD    Location  [facility]    Staff Present  Russella Dar, MS, EP, [facility], Exercise Physiologist;Debra Wynetta Emery, RN, Cory Munch, Exercise Physiologist    Medication changes reported      No    Fall or balance concerns reported     No    Tobacco Cessation  --   Quit 2002   Warm-up and Cool-down  Performed as group-led instruction    Resistance Training Performed  Yes    VAD Patient?  No    PAD/SET Patient?  No      Pain Assessment   Currently in Pain?  No/denies    Pain Score  0-No pain    Multiple Pain Sites  No       Capillary Blood Glucose: No results found for this or any previous visit (from the past 24 hour(s)).  Exercise Prescription Changes - 09/26/18 1000      Response to Exercise   Blood Pressure (Admit)  118/60    Blood Pressure (Exercise)  130/60    Blood Pressure (Exit)  116/60    Heart Rate (Admit)  60 bpm    Heart Rate (Exercise)  75 bpm    Heart Rate (Exit)  64 bpm    Oxygen Saturation (Admit)  95 %    Oxygen Saturation (Exercise)  91 %    Oxygen Saturation (Exit)  95 %    Rating of Perceived Exertion (Exercise)  9    Perceived Dyspnea (Exercise)  13    Comments  6 minute walk test    Duration  Progress to 30 minutes of  aerobic without signs/symptoms of physical distress    Intensity  THRR New   93-110-127     Home Exercise Plan   Plans to continue exercise at  Home (comment)   walking   Frequency  Add 3 additional days to program exercise sessions.    Initial Home  Exercises Provided  09/26/18       Social History   Tobacco Use  Smoking Status Former Smoker  . Packs/day: 1.00  . Years: 43.00  . Pack years: 43.00  . Types: Cigarettes  . Start date: 10/17/1957  . Last attempt to quit: 12/13/2000  . Years since quitting: 17.7  Smokeless Tobacco Never Used  Tobacco Comment   started at age 77    Goals Met:  Independence with exercise equipment Improved SOB with ADL's Exercise tolerated well Personal goals reviewed Queuing for purse lip breathing No report of cardiac concerns or symptoms Strength training completed today  Goals Unmet:  Not Applicable  Comments: Check out 10:30   Dr. Sinda Du is Medical Director for [facility] Pulmonary Rehab.

## 2018-09-27 ENCOUNTER — Encounter (HOSPITAL_COMMUNITY): Payer: Medicare Other

## 2018-10-01 ENCOUNTER — Telehealth: Payer: Self-pay | Admitting: Cardiology

## 2018-10-01 ENCOUNTER — Ambulatory Visit (INDEPENDENT_AMBULATORY_CARE_PROVIDER_SITE_OTHER): Payer: Medicare Other

## 2018-10-01 DIAGNOSIS — Z950 Presence of cardiac pacemaker: Secondary | ICD-10-CM | POA: Diagnosis not present

## 2018-10-01 DIAGNOSIS — I5022 Chronic systolic (congestive) heart failure: Secondary | ICD-10-CM

## 2018-10-01 DIAGNOSIS — I442 Atrioventricular block, complete: Secondary | ICD-10-CM

## 2018-10-01 NOTE — Telephone Encounter (Signed)
Confirmed remote transmission w/ pt wife.   

## 2018-10-01 NOTE — Progress Notes (Signed)
EPIC Encounter for ICM Monitoring  Patient Name: [name] is a 77 y.o. male Date: 10/01/2018 Primary Care Physican: Glenda Chroman, MD Primary Cardiologist:[place] Electrophysiologist: Santina Evans Pacing:98.9% Last Weight: 128lbs  Today's Weight: unknown                                                   Transmission reviewed   Thoracic impedance normal.   Prescribed: Furosemide20 mg3tablets (60 mg total)daily.Potassium 20 mEq take 1 tablet daily.  Labs: 09/04/2018 Creatinine 1.06, BUN 16, Potassium 4.0, Sodium 139, eGFR >60 08/16/2018 Creatinine 1.03, BUN 15, Potassium 4.1, Sodium 139, eGFR >60 08/01/2018 Creatinine 1.07, BUN 19, Potassium 4.0, Sodium 142, eGFR >60 06/21/2018 Creatinine 1.26, BUN 17, Potassium 3.7, Sodium 144, eGFR 54->60 06/11/2018 Creatinine 1.00, BUN 20, Potassium 4.0, Sodium 145, eGFR >60 03/16/2018 Creatinine 0.97, BUN 21, Potassium 4.3, Sodium 141, EGFR >60 A complete set of results can be found in Results Review.  Recommendations: None  Follow-up plan: ICM clinic phone appointment on 11/05/2018.     Copy of ICM check sent to Dr. Lovena Le.   3 month ICM trend: 10/01/2018    1 Year ICM trend:       Rosalene Billings, RN 10/01/2018 2:31 PM

## 2018-10-01 NOTE — Progress Notes (Signed)
Remote pacemaker transmission.   

## 2018-10-02 ENCOUNTER — Encounter (HOSPITAL_COMMUNITY)
Admission: RE | Admit: 2018-10-02 | Discharge: 2018-10-02 | Disposition: A | Discharge status: Home / Self Care | Payer: Medicare Other | Source: Ambulatory Visit | Attending: Internal Medicine | Admitting: Internal Medicine

## 2018-10-02 DIAGNOSIS — I5022 Chronic systolic (congestive) heart failure: Secondary | ICD-10-CM | POA: Diagnosis not present

## 2018-10-02 NOTE — Progress Notes (Signed)
Daily Session Note  Patient Details  Name: KENDRY PFARR MRN: [id number] Date of Birth: [birth year] Referring Provider:     PULMONARY REHAB OTHER RESP ORIENTATION from 09/26/2018 in [place]  Referring Provider  Vyas      Encounter Date: 10/02/2018  Check In: Session Check In - 10/02/18 1045      Check-In   Supervising physician immediately available to respond to emergencies  See telemetry face sheet for immediately available MD    Location  [facility]    Staff Present  Russella Dar, MS, EP, [facility], Exercise Physiologist;Stevee Valenta Zachery Conch, Exercise Physiologist    Medication changes reported      No    Fall or balance concerns reported     No    Warm-up and Cool-down  Performed as group-led instruction    Resistance Training Performed  Yes    VAD Patient?  No    PAD/SET Patient?  No      Pain Assessment   Currently in Pain?  No/denies    Pain Score  0-No pain    Multiple Pain Sites  No       Capillary Blood Glucose: No results found for this or any previous visit (from the past 24 hour(s)).    Social History   Tobacco Use  Smoking Status Former Smoker  . Packs/day: 1.00  . Years: 43.00  . Pack years: 43.00  . Types: Cigarettes  . Start date: 10/17/1957  . Last attempt to quit: 12/13/2000  . Years since quitting: 17.8  Smokeless Tobacco Never Used  Tobacco Comment   started at age 51    Goals Met:  Independence with exercise equipment Exercise tolerated well No report of cardiac concerns or symptoms Strength training completed today  Goals Unmet:  Not Applicable  Comments: Pt able to follow exercise prescription today without complaint.  Will continue to monitor for progression. Check out 1145.   Dr. Sinda Du is Medical Director for [facility].

## 2018-10-04 ENCOUNTER — Encounter (HOSPITAL_COMMUNITY)
Admission: RE | Admit: 2018-10-04 | Discharge: 2018-10-04 | Disposition: A | Discharge status: Home / Self Care | Payer: Medicare Other | Source: Ambulatory Visit | Attending: Internal Medicine | Admitting: Internal Medicine

## 2018-10-04 DIAGNOSIS — I5022 Chronic systolic (congestive) heart failure: Secondary | ICD-10-CM | POA: Diagnosis not present

## 2018-10-04 DIAGNOSIS — E039 Hypothyroidism, unspecified: Secondary | ICD-10-CM | POA: Diagnosis not present

## 2018-10-04 NOTE — Progress Notes (Signed)
Daily Session Note  Patient Details  Name: OLUWASEUN CREMER MRN: [id number] Date of Birth: [birth year] Referring Provider:     PULMONARY REHAB OTHER RESP ORIENTATION from 09/26/2018 in [place]  Referring Provider  Vyas      Encounter Date: 10/04/2018  Check In: Session Check In - 10/04/18 1045      Check-In   Supervising physician immediately available to respond to emergencies  See telemetry face sheet for immediately available MD    Location  [facility]    Staff Present  Russella Dar, MS, EP, [facility], Exercise Physiologist;Amanda Ballard, Exercise Physiologist;Tykwon Fera Wynetta Emery, RN, BSN    Medication changes reported      No    Fall or balance concerns reported     No    Warm-up and Cool-down  Performed as group-led instruction    Resistance Training Performed  Yes    VAD Patient?  No    PAD/SET Patient?  No      Pain Assessment   Currently in Pain?  No/denies    Pain Score  0-No pain    Multiple Pain Sites  No       Capillary Blood Glucose: No results found for this or any previous visit (from the past 24 hour(s)).    Social History   Tobacco Use  Smoking Status Former Smoker  . Packs/day: 1.00  . Years: 43.00  . Pack years: 43.00  . Types: Cigarettes  . Start date: 10/17/1957  . Last attempt to quit: 12/13/2000  . Years since quitting: 17.8  Smokeless Tobacco Never Used  Tobacco Comment   started at age 77    Goals Met:  Proper associated with RPD/PD & O2 Sat Independence with exercise equipment Improved SOB with ADL's Using PLB without cueing & demonstrates good technique Exercise tolerated well No report of cardiac concerns or symptoms Strength training completed today  Goals Unmet:  Not Applicable  Comments: Pt able to follow exercise prescription today without complaint.  Will continue to monitor for progression. Check out 1145.   Dr. Sinda Du is Medical Director for [facility].

## 2018-10-04 NOTE — Progress Notes (Signed)
Pulmonary Individual Treatment Plan  Patient Details  Name: [name] MRN: [id number] Date of Birth: 08/08/1941 Referring Provider:     PULMONARY REHAB OTHER RESP ORIENTATION from 09/26/2018 in [place]  Referring Provider  Vyas      Initial Encounter Date:    PULMONARY REHAB OTHER RESP ORIENTATION from 09/26/2018 in [place]  Date  09/26/18      Visit Diagnosis: Chronic systolic CHF (congestive heart failure) (HCC)  Patient's Home Medications on Admission:   Current Outpatient Medications:  .  aspirin EC 81 MG tablet, Take 81 mg by mouth at bedtime., Disp: , Rfl:  .  bisoprolol (ZEBETA) 5 MG tablet, Take 0.5 tablet (2.5 mg total) by mouth once a day., Disp: 30 tablet, Rfl: 3 .  Calcium Carb-Cholecalciferol (CALCIUM 500 + D3 PO), Take by mouth., Disp: , Rfl:  .  Cyanocobalamin (VITAMIN B-12 IJ), Inject as directed every 30 (thirty) days., Disp: , Rfl:  .  eplerenone (INSPRA) 25 MG tablet, Take 1 tablet (25 mg total) by mouth daily. Take at bed time, Disp: 45 tablet, Rfl: 3 .  fluticasone (FLONASE) 50 MCG/ACT nasal spray, Place 1 spray into both nostrils daily as needed., Disp: , Rfl:  .  furosemide (LASIX) 20 MG tablet, Take 3 tablets (60 mg total) by mouth every morning., Disp: 120 tablet, Rfl: 2 .  levofloxacin (LEVAQUIN) 500 MG tablet, Take 500 mg by mouth daily., Disp: , Rfl: 0 .  levothyroxine (SYNTHROID, LEVOTHROID) 112 MCG tablet, Take 1 tablet (112 mcg total) by mouth daily before breakfast. (Patient taking differently: Take 125 mcg by mouth daily before breakfast. ), Disp: 90 tablet, Rfl: 2 .  losartan (COZAAR) 25 MG tablet, Take 0.5 tablets (12.5 mg total) by mouth at bedtime., Disp: 30 tablet, Rfl: 11 .  memantine (NAMENDA) 10 MG tablet, TAKE 1 TABLET BY MOUTH TWICE A DAY (Patient not taking: Reported on 09/26/2018), Disp: 180 tablet, Rfl: 4 .  Polyethyl Glycol-Propyl Glycol (LUBRICANT EYE DROPS) 0.4-0.3 % SOLN, Place  1-2 drops into both eyes 2 (two) times daily as needed (for dry/irritated eyes.)., Disp: , Rfl:  .  potassium chloride SA (K-DUR,KLOR-CON) 20 MEQ tablet, Take 1 tablet (20 mEq total) by mouth daily., Disp: 90 tablet, Rfl: 3 .  predniSONE (DELTASONE) 5 MG tablet, Take 2.5 mg by mouth 2 (two) times daily with a meal., Disp: , Rfl:  .  simvastatin (ZOCOR) 20 MG tablet, Take 1 tablet (20 mg total) by mouth at bedtime., Disp: 90 tablet, Rfl: 3  Past Medical History: Past Medical History:  Diagnosis Date  . AICD (automatic cardioverter/defibrillator) present   . Aortic insufficiency     moderate aortic ins moderate/asymptomatic/normal LV cavity size.   . Carotid artery disease ([place])     less than 50% stenosis bilaterally  . Carotid artery disease ([place])   . Chronic systolic CHF (congestive heart failure) ([place])   . Coronary artery disease   . Drug-induced gynecomastia     secondary to spironolactone  . Dyslipidemia   . Hypertension   . ICD (implantable cardiac defibrillator), biventricular, in situ     [employer] model  [id number]  . Ischemic cardiomyopathy     status post non-ST elevation microinfarction stent to the right coronary artery is a   . LV dysfunction     NYHA class I/prior ejection fraction 20-25% and an ejection fraction 7 2009 50-55%, ejection fraction 60% bedside echocardiogram July 2012   . PONV (postoperative nausea and  vomiting)   . Pulmonary embolism (HCC)    Prior history of pulmonary embolism off Coumadin.  . 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 Packs/day: 1.00  . Years: 43.00  . Pack years: 43.00  . Types: Cigarettes  . Start date: 10/17/1957  . Last attempt to quit: 12/13/2000  . Years since quitting: 17.8  Smokeless Tobacco Never Used  Tobacco Comment   started  at age 72    Labs: Recent Review Flowsheet Data    Labs for ITP Cardiac and Pulmonary Rehab Latest Ref Rng & Units 12/10/2015 12/10/2015 10/08/2016   HCO3 20.0 - 24.0 mEq/L 31.0(H) 28.9(H) -   TCO2 0 - 100 mmol/L 33 30 33   O2SAT % 64.0 96.0 -      Capillary Blood Glucose: Lab Results  Component Value Date   GLUCAP 88 02/03/2011   GLUCAP 81 01/31/2011     Pulmonary Assessment Scores: Pulmonary Assessment Scores    Row Name 09/26/18 1106         ADL UCSD   SOB Score total  26     Rest  0     Walk  7     Stairs  4     Bath  0     Dress  0     Shop  1       CAT Score   CAT Score  23       mMRC Score   mMRC Score  4        Pulmonary Function Assessment:   Exercise Target Goals: Exercise Program Goal: Individual exercise prescription set using results from initial 6 min walk test and THRR while considering  patient's activity barriers and safety.   Exercise Prescription Goal: Initial exercise prescription builds to 30-45 minutes a day of aerobic activity, 2-3 days per week.  Home exercise guidelines will be given to patient during program as part of exercise prescription that the participant will acknowledge.  Activity Barriers & Risk Stratification: Activity Barriers & Cardiac Risk Stratification - 09/26/18 1044      Activity Barriers & Cardiac Risk Stratification   Activity Barriers  Muscular Weakness;Shortness of Breath;Other (comment)    Comments  L knee pain due to gout     Cardiac Risk Stratification  High       6 Minute Walk: 6 Minute Walk    Row Name 09/26/18 1043         6 Minute Walk   Phase  Initial     Distance  1150 feet     Walk Time  6 minutes     # of Rest Breaks  0     MPH  2.18     METS  2.67     RPE  9     Perceived Dyspnea   13     VO2 Peak  9.03     Symptoms  No     Resting HR  60 bpm     Resting BP  118/60     Resting Oxygen Saturation   95 %     Exercise Oxygen Saturation  during 6 min walk  91 %     Max Ex. HR  75  bpm     Max Ex. BP  130/60  2 Minute Post BP  116/60        Oxygen Initial Assessment: Oxygen Initial Assessment - 09/26/18 1105      Home Oxygen   Home Oxygen Device  None       Oxygen Re-Evaluation:   Oxygen Discharge (Final Oxygen Re-Evaluation):   Initial Exercise Prescription: Initial Exercise Prescription - 09/26/18 1000      Date of Initial Exercise RX and Referring Provider   Date  09/26/18    Referring Provider  Vyas    Expected Discharge Date  12/26/18      Treadmill   MPH  0.8    Grade  0    Minutes  17    METs  1.61      NuStep   Level  1    SPM  45    Minutes  22    METs  1.6      Prescription Details   Frequency (times per week)  2    Duration  Progress to 30 minutes of continuous aerobic without signs/symptoms of physical distress      Intensity   THRR 40-80% of Max Heartrate  93-110-127    Ratings of Perceived Exertion  11-13    Perceived Dyspnea  0-4      Progression   Progression  Continue to progress workloads to maintain intensity without signs/symptoms of physical distress.      Resistance Training   Training Prescription  Yes    Weight  1    Reps  10-15       Perform Capillary Blood Glucose checks as needed.  Exercise Prescription Changes:  Exercise Prescription Changes    Row Name 09/26/18 1000 10/04/18 0800           Response to Exercise   Blood Pressure (Admit)  118/60  130/62      Blood Pressure (Exercise)  130/60  144/78      Blood Pressure (Exit)  116/60  122/70      Heart Rate (Admit)  60 bpm  89 bpm      Heart Rate (Exercise)  75 bpm  91 bpm      Heart Rate (Exit)  64 bpm  67 bpm      Oxygen Saturation (Admit)  95 %  94 %      Oxygen Saturation (Exercise)  91 %  90 %      Oxygen Saturation (Exit)  95 %  92 %      Rating of Perceived Exertion (Exercise)  9  11      Perceived Dyspnea (Exercise)  13  11      Comments  6 minute walk test  First exercise session       Duration  Progress to 30 minutes of   aerobic without signs/symptoms of physical distress  Progress to 30 minutes of  aerobic without signs/symptoms of physical distress      Intensity  THRR New 93-110-127  THRR unchanged        Resistance Training   Training Prescription  -  Yes      Weight  -  1      Reps  -  10-15        Treadmill   MPH  -  1      Grade  -  0      Minutes  -  17      METs  -  1.76  NuStep   Level  -  1      SPM  -  80      Minutes  -  22      METs  -  1.8        Home Exercise Plan   Plans to continue exercise at  Home (comment) walking  Home (comment)      Frequency  Add 3 additional days to program exercise sessions.  Add 3 additional days to program exercise sessions.      Initial Home Exercises Provided  09/26/18  09/26/18         Exercise Comments:  Exercise Comments    Row Name 10/04/18 0803           Exercise Comments  Pt. has attended one exercise session so far. He tolerated the exercise well. We will continue to monitor his progress and increase him as tolerated.           Exercise Goals and Review:  Exercise Goals    Row Name 09/26/18 1046             Exercise Goals   Increase Physical Activity  Yes       Intervention  Provide advice, education, support and counseling about physical activity/exercise needs.;Develop an individualized exercise prescription for aerobic and resistive training based on initial evaluation findings, risk stratification, comorbidities and participant's personal goals.       Expected Outcomes  Short Term: Attend rehab on a regular basis to increase amount of physical activity.       Increase Strength and Stamina  Yes       Intervention  Provide advice, education, support and counseling about physical activity/exercise needs.;Develop an individualized exercise prescription for aerobic and resistive training based on initial evaluation findings, risk stratification, comorbidities and participant's personal goals.       Expected Outcomes   Short Term: Increase workloads from initial exercise prescription for resistance, speed, and METs.       Able to understand and use rate of perceived exertion (RPE) scale  Yes       Intervention  Provide education and explanation on how to use RPE scale       Expected Outcomes  Short Term: Able to use RPE daily in rehab to express subjective intensity level;Long Term:  Able to use RPE to guide intensity level when exercising independently       Able to understand and use Dyspnea scale  Yes       Intervention  Provide education and explanation on how to use Dyspnea scale       Expected Outcomes  Short Term: Able to use Dyspnea scale daily in rehab to express subjective sense of shortness of breath during exertion;Long Term: Able to use Dyspnea scale to guide intensity level when exercising independently       Knowledge and understanding of Target Heart Rate Range (THRR)  Yes       Intervention  Provide education and explanation of THRR including how the numbers were predicted and where they are located for reference       Expected Outcomes  Short Term: Able to state/look up THRR       Able to check pulse independently  Yes       Intervention  Review the importance of being able to check your own pulse for safety during independent exercise       Expected Outcomes  Short Term: Able to explain why pulse checking  is important during independent exercise;Long Term: Able to check pulse independently and accurately       Understanding of Exercise Prescription  Yes       Intervention  Provide education, explanation, and written materials on patient's individual exercise prescription       Expected Outcomes  Short Term: Able to explain program exercise prescription;Long Term: Able to explain home exercise prescription to exercise independently          Exercise Goals Re-Evaluation : Exercise Goals Re-Evaluation    Row Name 10/04/18 0802             Exercise Goal Re-Evaluation   Exercise Goals  Review  Increase Physical Activity;Increase Strength and Stamina;Able to understand and use rate of perceived exertion (RPE) scale;Able to understand and use Dyspnea scale;Knowledge and understanding of Target Heart Rate Range (THRR);Able to check pulse independently;Understanding of Exercise Prescription       Comments  Pt. has attended one exercise session so far. He tolerated the exercise well. We will continue to monitor his progress.        Expected Outcomes  breathe better and increase strength           Discharge Exercise Prescription (Final Exercise Prescription Changes): Exercise Prescription Changes - 10/04/18 0800      Response to Exercise   Blood Pressure (Admit)  130/62    Blood Pressure (Exercise)  144/78    Blood Pressure (Exit)  122/70    Heart Rate (Admit)  89 bpm    Heart Rate (Exercise)  91 bpm    Heart Rate (Exit)  67 bpm    Oxygen Saturation (Admit)  94 %    Oxygen Saturation (Exercise)  90 %    Oxygen Saturation (Exit)  92 %    Rating of Perceived Exertion (Exercise)  11    Perceived Dyspnea (Exercise)  11    Comments  First exercise session     Duration  Progress to 30 minutes of  aerobic without signs/symptoms of physical distress    Intensity  THRR unchanged      Resistance Training   Training Prescription  Yes    Weight  1    Reps  10-15      Treadmill   MPH  1    Grade  0    Minutes  17    METs  1.76      NuStep   Level  1    SPM  80    Minutes  22    METs  1.8      Home Exercise Plan   Plans to continue exercise at  Home (comment)    Frequency  Add 3 additional days to program exercise sessions.    Initial Home Exercises Provided  09/26/18       Nutrition:  Target Goals: Understanding of nutrition guidelines, daily intake of sodium 1500mg , cholesterol 200mg , calories 30% from fat and 7% or less from saturated fats, daily to have 5 or more servings of fruits and vegetables.  Biometrics: Pre Biometrics - 09/26/18 1046      Pre  Biometrics   Height  5\' 9"  (1.753 m)    Weight  60.3 kg    Waist Circumference  31.5 inches    Hip Circumference  34 inches    Waist to Hip Ratio  0.93 %    BMI (Calculated)  19.62    Triceps Skinfold  4 mm    % Body Fat  15.7 %  Grip Strength  14.7 kg    Flexibility  0 in    Single Leg Stand  3.09 seconds        Nutrition Therapy Plan and Nutrition Goals: Nutrition Therapy & Goals - 09/26/18 1110      Personal Nutrition Goals   Personal Goal #2  Is following his low sodium, heart healthy diet.     Additional Goals?  No       Nutrition Assessments: Nutrition Assessments - 09/26/18 1111      MEDFICTS Scores   Pre Score  15       Nutrition Goals Re-Evaluation:   Nutrition Goals Discharge (Final Nutrition Goals Re-Evaluation):   Psychosocial: Target Goals: Acknowledge presence or absence of significant depression and/or stress, maximize coping skills, provide positive support system. Participant is able to verbalize types and ability to use techniques and skills needed for reducing stress and depression.  Initial Review & Psychosocial Screening: Initial Psych Review & Screening - 09/26/18 1108      Initial Review   Current issues with  Current Depression   His depression is mild and concering his recent health problems because he can't work     [place]?  Yes      Barriers   Psychosocial barriers to participate in program  Psychosocial barriers identified (see note)      Screening Interventions   Interventions  Encouraged to exercise    Expected Outcomes  Short Term goal: Identification and review with participant of any Quality of Life or Depression concerns found by scoring the questionnaire.;Long Term goal: The participant improves quality of Life and PHQ9 Scores as seen by post scores and/or verbalization of changes       Quality of Life Scores: Quality of Life - 09/26/18 1047      Quality of Life   Select  Quality of  Life      Quality of Life Scores   Health/Function Pre  13.71 %    Socioeconomic Pre  19.71 %    Psych/Spiritual Pre  23.14 %    Family Pre  30 %    GLOBAL Pre  19.45 %      Scores of 19 and below usually indicate a poorer quality of life in these areas.  A difference of  2-3 points is a clinically meaningful difference.  A difference of 2-3 points in the total score of the Quality of Life Index has been associated with significant improvement in overall quality of life, self-image, physical symptoms, and general health in studies assessing change in quality of life.   PHQ-9: Recent Review Flowsheet Data    Depression screen [facility] 2/9 09/26/2018 04/26/2018   Decreased Interest 0 0   Down, Depressed, Hopeless 1 0   PHQ - 2 Score 1 0   Altered sleeping 0 -   Tired, decreased energy 1 -   Change in appetite 0 -   Feeling bad or failure about yourself  0 -   Trouble concentrating 0 -   Moving slowly or fidgety/restless 0 -   Suicidal thoughts 0 -   PHQ-9 Score 2 -   Difficult doing work/chores Not difficult at all -     Interpretation of Total Score  Total Score Depression Severity:  1-4 = Minimal depression, 5-9 = Mild depression, 10-14 = Moderate depression, 15-19 = Moderately severe depression, 20-27 = Severe depression   Psychosocial Evaluation and Intervention: Psychosocial Evaluation - 09/26/18 1110  Psychosocial Evaluation & Interventions   Interventions  Encouraged to exercise with the program and follow exercise prescription    Continue Psychosocial Services   Follow up required by staff       Psychosocial Re-Evaluation:   Psychosocial Discharge (Final Psychosocial Re-Evaluation):    Education: Education Goals: Education classes will be provided on a weekly basis, covering required topics. Participant will state understanding/return demonstration of topics presented.  Learning Barriers/Preferences: Learning Barriers/Preferences - 09/26/18 1020       Learning Barriers/Preferences   Learning Barriers  None    Learning Preferences  Written Material;Pictoral;Video       Education Topics: How Lungs Work and Diseases: - Discuss the anatomy of the lungs and diseases that can affect the lungs, such as COPD.   Exercise: -Discuss the importance of exercise, FITT principles of exercise, normal and abnormal responses to exercise, and how to exercise safely.   Environmental Irritants: -Discuss types of environmental irritants and how to limit exposure to environmental irritants.   Meds/Inhalers and oxygen: - Discuss respiratory medications, definition of an inhaler and oxygen, and the proper way to use an inhaler and oxygen.   Energy Saving Techniques: - Discuss methods to conserve energy and decrease shortness of breath when performing activities of daily living.    Bronchial Hygiene / Breathing Techniques: - Discuss breathing mechanics, pursed-lip breathing technique,  proper posture, effective ways to clear airways, and other functional breathing techniques   Cleaning Equipment: - Provides group verbal and written instruction about the health risks of elevated stress, cause of high stress, and healthy ways to reduce stress.   Nutrition I: Fats: - Discuss the types of cholesterol, what cholesterol does to the body, and how cholesterol levels can be controlled.   PULMONARY REHAB OTHER RESPIRATORY from 10/04/2018 in [place]  Date  10/04/18  Educator  Wynetta Emery  Instruction Review Code  2- Demonstrated Understanding      Nutrition II: Labels: -Discuss the different components of food labels and how to read food labels.   Respiratory Infections: - Discuss the signs and symptoms of respiratory infections, ways to prevent respiratory infections, and the importance of seeking medical treatment when having a respiratory infection.   Stress I: Signs and Symptoms: - Discuss the causes of stress, how stress  may lead to anxiety and depression, and ways to limit stress.   Stress II: Relaxation: -Discuss relaxation techniques to limit stress.   Oxygen for Home/Travel: - Discuss how to prepare for travel when on oxygen and proper ways to transport and store oxygen to ensure safety.   Knowledge Questionnaire Score: Knowledge Questionnaire Score - 09/26/18 1111      Knowledge Questionnaire Score   Pre Score  14/18       Core Components/Risk Factors/Patient Goals at Admission: Personal Goals and Risk Factors at Admission - 09/26/18 1112      Core Components/Risk Factors/Patient Goals on Admission    Weight Management  Weight Maintenance    Personal Goal Other  Yes    Personal Goal  Breathe better, Get stronger    Intervention  Attend program 2 x week and supplement with at home exercise 3 x week    Expected Outcomes  Reach personal goals.        Core Components/Risk Factors/Patient Goals Review:    Core Components/Risk Factors/Patient Goals at Discharge (Final Review):    ITP Comments: ITP Comments    Row Name 09/26/18 1101 10/04/18 1522  ITP Comments  Patient was reccomended by Dr. Woody Seller due to CHF. Due to patients EF being above 35% we have had to place him in our pulmonary program.  Patient is new to program. He has completed 3 sessions. Will continue to monitor for progress.          Comments: ITP REVIEW Patient is new to program. He has completed 3 sessions. Will continue to monitor for progress.

## 2018-10-04 NOTE — Progress Notes (Signed)
Wife called.  Transmission reviewed and stated it was normal.  She said he is feeling fine and not having any fluid symptoms at this time.  Encouraged to call for fluid symptoms.  Next transmission 11/05/2018

## 2018-10-09 ENCOUNTER — Encounter (HOSPITAL_COMMUNITY): Payer: Medicare Other

## 2018-10-11 ENCOUNTER — Encounter (HOSPITAL_COMMUNITY): Payer: Medicare Other

## 2018-10-11 DIAGNOSIS — Z299 Encounter for prophylactic measures, unspecified: Secondary | ICD-10-CM | POA: Diagnosis not present

## 2018-10-11 DIAGNOSIS — J209 Acute bronchitis, unspecified: Secondary | ICD-10-CM | POA: Diagnosis not present

## 2018-10-11 DIAGNOSIS — Z6821 Body mass index (BMI) 21.0-21.9, adult: Secondary | ICD-10-CM | POA: Diagnosis not present

## 2018-10-11 DIAGNOSIS — J439 Emphysema, unspecified: Secondary | ICD-10-CM | POA: Diagnosis not present

## 2018-10-11 DIAGNOSIS — J440 Chronic obstructive pulmonary disease with acute lower respiratory infection: Secondary | ICD-10-CM | POA: Diagnosis not present

## 2018-10-11 DIAGNOSIS — R413 Other amnesia: Secondary | ICD-10-CM | POA: Diagnosis not present

## 2018-10-16 ENCOUNTER — Encounter (HOSPITAL_COMMUNITY): Payer: Medicare Other

## 2018-10-16 DIAGNOSIS — R413 Other amnesia: Secondary | ICD-10-CM | POA: Diagnosis not present

## 2018-10-18 ENCOUNTER — Encounter (HOSPITAL_COMMUNITY): Payer: Medicare Other

## 2018-10-22 ENCOUNTER — Other Ambulatory Visit: Payer: Self-pay | Admitting: Internal Medicine

## 2018-10-23 ENCOUNTER — Encounter (HOSPITAL_COMMUNITY): Payer: Medicare Other

## 2018-10-25 ENCOUNTER — Encounter (HOSPITAL_COMMUNITY): Payer: Medicare Other

## 2018-10-25 NOTE — Progress Notes (Signed)
Pulmonary Individual Treatment Plan  Patient Details  Name: [name] MRN: [id number] Date of Birth: Dec 27, 1940 Referring Provider:     PULMONARY REHAB OTHER RESP ORIENTATION from 09/26/2018 in [place]  Referring Provider  Vyas      Initial Encounter Date:    PULMONARY REHAB OTHER RESP ORIENTATION from 09/26/2018 in [facility]  Date  09/26/18      Visit Diagnosis: Chronic systolic CHF (congestive heart failure) (HCC)  Patient's Home Medications on Admission:   Current Outpatient Medications:  .  aspirin EC 81 MG tablet, Take 81 mg by mouth at bedtime., Disp: , Rfl:  .  bisoprolol (ZEBETA) 5 MG tablet, Take 0.5 tablet (2.5 mg total) by mouth once a day., Disp: 30 tablet, Rfl: 3 .  Calcium Carb-Cholecalciferol (CALCIUM 500 + D3 PO), Take by mouth., Disp: , Rfl:  .  Cyanocobalamin (VITAMIN B-12 IJ), Inject as directed every 30 (thirty) days., Disp: , Rfl:  .  eplerenone (INSPRA) 25 MG tablet, Take 1 tablet (25 mg total) by mouth daily. Take at bed time, Disp: 45 tablet, Rfl: 3 .  fluticasone (FLONASE) 50 MCG/ACT nasal spray, Place 1 spray into both nostrils daily as needed., Disp: , Rfl:  .  furosemide (LASIX) 20 MG tablet, Take 3 tablets (60 mg total) by mouth every morning., Disp: 120 tablet, Rfl: 2 .  levofloxacin (LEVAQUIN) 500 MG tablet, Take 500 mg by mouth daily., Disp: , Rfl: 0 .  levothyroxine (SYNTHROID, LEVOTHROID) 112 MCG tablet, Take 1 tablet (112 mcg total) by mouth daily before breakfast. (Patient taking differently: Take 125 mcg by mouth daily before breakfast. ), Disp: 90 tablet, Rfl: 2 .  losartan (COZAAR) 25 MG tablet, Take 0.5 tablets (12.5 mg total) by mouth at bedtime., Disp: 30 tablet, Rfl: 11 .  memantine (NAMENDA) 10 MG tablet, TAKE 1 TABLET BY MOUTH TWICE A DAY (Patient not taking: Reported on 09/26/2018), Disp: 180 tablet, Rfl: 4 .  Polyethyl Glycol-Propyl Glycol (LUBRICANT EYE DROPS) 0.4-0.3 % SOLN, Place  1-2 drops into both eyes 2 (two) times daily as needed (for dry/irritated eyes.)., Disp: , Rfl:  .  potassium chloride SA (K-DUR,KLOR-CON) 20 MEQ tablet, Take 1 tablet (20 mEq total) by mouth daily., Disp: 90 tablet, Rfl: 3 .  predniSONE (DELTASONE) 5 MG tablet, Take 2.5 mg by mouth 2 (two) times daily with a meal., Disp: , Rfl:  .  simvastatin (ZOCOR) 20 MG tablet, Take 1 tablet (20 mg total) by mouth at bedtime., Disp: 90 tablet, Rfl: 3  Past Medical History: Past Medical History:  Diagnosis Date  . AICD (automatic cardioverter/defibrillator) present   . Aortic insufficiency     moderate aortic ins moderate/asymptomatic/normal LV cavity size.   . Carotid artery disease ([place])     less than 50% stenosis bilaterally  . Carotid artery disease ([place])   . Chronic systolic CHF (congestive heart failure) ([place])   . Coronary artery disease   . Drug-induced gynecomastia     secondary to spironolactone  . Dyslipidemia   . Hypertension   . ICD (implantable cardiac defibrillator), biventricular, in situ     [employer] model  [id number]  . Ischemic cardiomyopathy     status post non-ST elevation microinfarction stent to the right coronary artery is a   . LV dysfunction     NYHA class I/prior ejection fraction 20-25% and an ejection fraction 7 2009 50-55%, ejection fraction 60% bedside echocardiogram July 2012   . PONV (postoperative nausea and  vomiting)   . Pulmonary embolism (HCC)    Prior history of pulmonary embolism off Coumadin.  . Ventricular tachycardia (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 Packs/day: 1.00  . Years: 43.00  . Pack years: 43.00  . Types: Cigarettes  . Start date: 10/17/1957  . Last attempt to quit: 12/13/2000  . Years since quitting: 17.8  Smokeless Tobacco Never Used  Tobacco Comment   started  at age 55    Labs: Recent Review Flowsheet Data    Labs for ITP Cardiac and Pulmonary Rehab Latest Ref Rng & Units 12/10/2015 12/10/2015 10/08/2016   HCO3 20.0 - 24.0 mEq/L 31.0(H) 28.9(H) -   TCO2 0 - 100 mmol/L 33 30 33   O2SAT % 64.0 96.0 -      Capillary Blood Glucose: Lab Results  Component Value Date   GLUCAP 88 02/03/2011   GLUCAP 81 01/31/2011     Pulmonary Assessment Scores: Pulmonary Assessment Scores    Row Name 09/26/18 1106         ADL UCSD   SOB Score total  26     Rest  0     Walk  7     Stairs  4     Bath  0     Dress  0     Shop  1       CAT Score   CAT Score  23       mMRC Score   mMRC Score  4        Pulmonary Function Assessment:   Exercise Target Goals: Exercise Program Goal: Individual exercise prescription set using results from initial 6 min walk test and THRR while considering  patient's activity barriers and safety.   Exercise Prescription Goal: Initial exercise prescription builds to 30-45 minutes a day of aerobic activity, 2-3 days per week.  Home exercise guidelines will be given to patient during program as part of exercise prescription that the participant will acknowledge.  Activity Barriers & Risk Stratification: Activity Barriers & Cardiac Risk Stratification - 09/26/18 1044      Activity Barriers & Cardiac Risk Stratification   Activity Barriers  Muscular Weakness;Shortness of Breath;Other (comment)    Comments  L knee pain due to gout     Cardiac Risk Stratification  High       6 Minute Walk: 6 Minute Walk    Row Name 09/26/18 1043         6 Minute Walk   Phase  Initial     Distance  1150 feet     Walk Time  6 minutes     # of Rest Breaks  0     MPH  2.18     METS  2.67     RPE  9     Perceived Dyspnea   13     VO2 Peak  9.03     Symptoms  No     Resting HR  60 bpm     Resting BP  118/60     Resting Oxygen Saturation   95 %     Exercise Oxygen Saturation  during 6 min walk  91 %     Max Ex. HR  75  bpm     Max Ex. BP  130/60  2 Minute Post BP  116/60        Oxygen Initial Assessment: Oxygen Initial Assessment - 09/26/18 1105      Home Oxygen   Home Oxygen Device  None       Oxygen Re-Evaluation:   Oxygen Discharge (Final Oxygen Re-Evaluation):   Initial Exercise Prescription: Initial Exercise Prescription - 09/26/18 1000      Date of Initial Exercise RX and Referring Provider   Date  09/26/18    Referring Provider  Vyas    Expected Discharge Date  12/26/18      Treadmill   MPH  0.8    Grade  0    Minutes  17    METs  1.61      NuStep   Level  1    SPM  45    Minutes  22    METs  1.6      Prescription Details   Frequency (times per week)  2    Duration  Progress to 30 minutes of continuous aerobic without signs/symptoms of physical distress      Intensity   THRR 40-80% of Max Heartrate  93-110-127    Ratings of Perceived Exertion  11-13    Perceived Dyspnea  0-4      Progression   Progression  Continue to progress workloads to maintain intensity without signs/symptoms of physical distress.      Resistance Training   Training Prescription  Yes    Weight  1    Reps  10-15       Perform Capillary Blood Glucose checks as needed.  Exercise Prescription Changes:  Exercise Prescription Changes    Row Name 09/26/18 1000 10/04/18 0800 10/18/18 1200         Response to Exercise   Blood Pressure (Admit)  118/60  130/62  160/70     Blood Pressure (Exercise)  130/60  144/78  140/70     Blood Pressure (Exit)  116/60  122/70  140/60     Heart Rate (Admit)  60 bpm  89 bpm  71 bpm     Heart Rate (Exercise)  75 bpm  91 bpm  87 bpm     Heart Rate (Exit)  64 bpm  67 bpm  87 bpm     Oxygen Saturation (Admit)  95 %  94 %  92 %     Oxygen Saturation (Exercise)  91 %  90 %  94 %     Oxygen Saturation (Exit)  95 %  92 %  96 %     Rating of Perceived Exertion (Exercise)  9  11  11      Perceived Dyspnea (Exercise)  13  11  11      Comments  6 minute walk  test  First exercise session   Third exercise session, has not returned since 10/04/2018     Duration  Progress to 30 minutes of  aerobic without signs/symptoms of physical distress  Progress to 30 minutes of  aerobic without signs/symptoms of physical distress  Continue with 30 min of aerobic exercise without signs/symptoms of physical distress.     Intensity  THRR New 93-110-127  THRR unchanged  THRR unchanged       Progression   Progression  -  -  Continue to progress workloads to maintain intensity without signs/symptoms of physical distress.     Average METs  -  -  2.05       Resistance Training  Training Prescription  -  Yes  Yes     Weight  -  1  1     Reps  -  10-15  10-15       Treadmill   MPH  -  1  1     Grade  -  0  0     Minutes  -  17  17     METs  -  1.76  1.8       NuStep   Level  -  1  1     SPM  -  80  84     Minutes  -  22  22     METs  -  1.8  1.8       Home Exercise Plan   Plans to continue exercise at  Home (comment) walking  Home (comment)  Home (comment)     Frequency  Add 3 additional days to program exercise sessions.  Add 3 additional days to program exercise sessions.  Add 3 additional days to program exercise sessions.     Initial Home Exercises Provided  09/26/18  09/26/18  09/26/18        Exercise Comments:  Exercise Comments    Row Name 10/04/18 0803 10/24/18 1452         Exercise Comments  Pt. has attended one exercise session so far. He tolerated the exercise well. We will continue to monitor his progress and increase him as tolerated.   Pt. has attended 3 exercise sessions so far, but has not been here since 10/04/2018. Once he returns we will adjust his exercise as needed for tolerance and continue to monitor and progress.         Exercise Goals and Review:  Exercise Goals    Row Name 09/26/18 1046             Exercise Goals   Increase Physical Activity  Yes       Intervention  Provide advice, education, support and counseling  about physical activity/exercise needs.;Develop an individualized exercise prescription for aerobic and resistive training based on initial evaluation findings, risk stratification, comorbidities and participant's personal goals.       Expected Outcomes  Short Term: Attend rehab on a regular basis to increase amount of physical activity.       Increase Strength and Stamina  Yes       Intervention  Provide advice, education, support and counseling about physical activity/exercise needs.;Develop an individualized exercise prescription for aerobic and resistive training based on initial evaluation findings, risk stratification, comorbidities and participant's personal goals.       Expected Outcomes  Short Term: Increase workloads from initial exercise prescription for resistance, speed, and METs.       Able to understand and use rate of perceived exertion (RPE) scale  Yes       Intervention  Provide education and explanation on how to use RPE scale       Expected Outcomes  Short Term: Able to use RPE daily in rehab to express subjective intensity level;Long Term:  Able to use RPE to guide intensity level when exercising independently       Able to understand and use Dyspnea scale  Yes       Intervention  Provide education and explanation on how to use Dyspnea scale       Expected Outcomes  Short Term: Able to use Dyspnea scale daily in rehab to express subjective sense  of shortness of breath during exertion;Long Term: Able to use Dyspnea scale to guide intensity level when exercising independently       Knowledge and understanding of Target Heart Rate Range (THRR)  Yes       Intervention  Provide education and explanation of THRR including how the numbers were predicted and where they are located for reference       Expected Outcomes  Short Term: Able to state/look up THRR       Able to check pulse independently  Yes       Intervention  Review the importance of being able to check your own pulse for  safety during independent exercise       Expected Outcomes  Short Term: Able to explain why pulse checking is important during independent exercise;Long Term: Able to check pulse independently and accurately       Understanding of Exercise Prescription  Yes       Intervention  Provide education, explanation, and written materials on patient's individual exercise prescription       Expected Outcomes  Short Term: Able to explain program exercise prescription;Long Term: Able to explain home exercise prescription to exercise independently          Exercise Goals Re-Evaluation : Exercise Goals Re-Evaluation    Row Name 10/04/18 0802 10/24/18 1447           Exercise Goal Re-Evaluation   Exercise Goals Review  Increase Physical Activity;Increase Strength and Stamina;Able to understand and use rate of perceived exertion (RPE) scale;Able to understand and use Dyspnea scale;Knowledge and understanding of Target Heart Rate Range (THRR);Able to check pulse independently;Understanding of Exercise Prescription  Increase Physical Activity;Increase Strength and Stamina;Able to understand and use rate of perceived exertion (RPE) scale;Able to understand and use Dyspnea scale;Knowledge and understanding of Target Heart Rate Range (THRR);Able to check pulse independently;Understanding of Exercise Prescription      Comments  Pt. has attended one exercise session so far. He tolerated the exercise well. We will continue to monitor his progress.   Pt. is still new to the program. He has attended 3 sessions so far, but has not returned since 10/04/2018. When he returns we will adjust prescription as needed, and monitor him to progress in the future.       Expected Outcomes  breathe better and increase strength   breathe better and increase strength          Discharge Exercise Prescription (Final Exercise Prescription Changes): Exercise Prescription Changes - 10/18/18 1200      Response to Exercise   Blood Pressure  (Admit)  160/70    Blood Pressure (Exercise)  140/70    Blood Pressure (Exit)  140/60    Heart Rate (Admit)  71 bpm    Heart Rate (Exercise)  87 bpm    Heart Rate (Exit)  87 bpm    Oxygen Saturation (Admit)  92 %    Oxygen Saturation (Exercise)  94 %    Oxygen Saturation (Exit)  96 %    Rating of Perceived Exertion (Exercise)  11    Perceived Dyspnea (Exercise)  11    Comments  Third exercise session, has not returned since 10/04/2018    Duration  Continue with 30 min of aerobic exercise without signs/symptoms of physical distress.    Intensity  THRR unchanged      Progression   Progression  Continue to progress workloads to maintain intensity without signs/symptoms of physical distress.    Average  METs  2.05      Resistance Training   Training Prescription  Yes    Weight  1    Reps  10-15      Treadmill   MPH  1    Grade  0    Minutes  17    METs  1.8      NuStep   Level  1    SPM  84    Minutes  22    METs  1.8      Home Exercise Plan   Plans to continue exercise at  Home (comment)    Frequency  Add 3 additional days to program exercise sessions.    Initial Home Exercises Provided  09/26/18       Nutrition:  Target Goals: Understanding of nutrition guidelines, daily intake of sodium 1500mg , cholesterol 200mg , calories 30% from fat and 7% or less from saturated fats, daily to have 5 or more servings of fruits and vegetables.  Biometrics: Pre Biometrics - 09/26/18 1046      Pre Biometrics   Height  5\' 9"  (1.753 m)    Weight  60.3 kg    Waist Circumference  31.5 inches    Hip Circumference  34 inches    Waist to Hip Ratio  0.93 %    BMI (Calculated)  19.62    Triceps Skinfold  4 mm    % Body Fat  15.7 %    Grip Strength  14.7 kg    Flexibility  0 in    Single Leg Stand  3.09 seconds        Nutrition Therapy Plan and Nutrition Goals: Nutrition Therapy & Goals - 09/26/18 1110      Personal Nutrition Goals   Personal Goal #2  Is following his low  sodium, heart healthy diet.     Additional Goals?  No       Nutrition Assessments: Nutrition Assessments - 09/26/18 1111      MEDFICTS Scores   Pre Score  15       Nutrition Goals Re-Evaluation:   Nutrition Goals Discharge (Final Nutrition Goals Re-Evaluation):   Psychosocial: Target Goals: Acknowledge presence or absence of significant depression and/or stress, maximize coping skills, provide positive support system. Participant is able to verbalize types and ability to use techniques and skills needed for reducing stress and depression.  Initial Review & Psychosocial Screening: Initial Psych Review & Screening - 09/26/18 1108      Initial Review   Current issues with  Current Depression   His depression is mild and concering his recent health problems because he can't work     [place]?  Yes      Barriers   Psychosocial barriers to participate in program  Psychosocial barriers identified (see note)      Screening Interventions   Interventions  Encouraged to exercise    Expected Outcomes  Short Term goal: Identification and review with participant of any Quality of Life or Depression concerns found by scoring the questionnaire.;Long Term goal: The participant improves quality of Life and PHQ9 Scores as seen by post scores and/or verbalization of changes       Quality of Life Scores: Quality of Life - 09/26/18 1047      Quality of Life   Select  Quality of Life      Quality of Life Scores   Health/Function Pre  13.71 %    Socioeconomic Pre  19.71 %  Psych/Spiritual Pre  23.14 %    Family Pre  30 %    GLOBAL Pre  19.45 %      Scores of 19 and below usually indicate a poorer quality of life in these areas.  A difference of  2-3 points is a clinically meaningful difference.  A difference of 2-3 points in the total score of the Quality of Life Index has been associated with significant improvement in overall quality of life,  self-image, physical symptoms, and general health in studies assessing change in quality of life.   PHQ-9: Recent Review Flowsheet Data    Depression screen [facility] 2/9 09/26/2018 04/26/2018   Decreased Interest 0 0   Down, Depressed, Hopeless 1 0   PHQ - 2 Score 1 0   Altered sleeping 0 -   Tired, decreased energy 1 -   Change in appetite 0 -   Feeling bad or failure about yourself  0 -   Trouble concentrating 0 -   Moving slowly or fidgety/restless 0 -   Suicidal thoughts 0 -   PHQ-9 Score 2 -   Difficult doing work/chores Not difficult at all -     Interpretation of Total Score  Total Score Depression Severity:  1-4 = Minimal depression, 5-9 = Mild depression, 10-14 = Moderate depression, 15-19 = Moderately severe depression, 20-27 = Severe depression   Psychosocial Evaluation and Intervention: Psychosocial Evaluation - 09/26/18 1110      Psychosocial Evaluation & Interventions   Interventions  Encouraged to exercise with the program and follow exercise prescription    Continue Psychosocial Services   Follow up required by staff       Psychosocial Re-Evaluation:   Psychosocial Discharge (Final Psychosocial Re-Evaluation):    Education: Education Goals: Education classes will be provided on a weekly basis, covering required topics. Participant will state understanding/return demonstration of topics presented.  Learning Barriers/Preferences: Learning Barriers/Preferences - 09/26/18 1020      Learning Barriers/Preferences   Learning Barriers  None    Learning Preferences  Written Material;Pictoral;Video       Education Topics: How Lungs Work and Diseases: - Discuss the anatomy of the lungs and diseases that can affect the lungs, such as COPD.   Exercise: -Discuss the importance of exercise, FITT principles of exercise, normal and abnormal responses to exercise, and how to exercise safely.   Environmental Irritants: -Discuss types of environmental irritants and  how to limit exposure to environmental irritants.   Meds/Inhalers and oxygen: - Discuss respiratory medications, definition of an inhaler and oxygen, and the proper way to use an inhaler and oxygen.   Energy Saving Techniques: - Discuss methods to conserve energy and decrease shortness of breath when performing activities of daily living.    Bronchial Hygiene / Breathing Techniques: - Discuss breathing mechanics, pursed-lip breathing technique,  proper posture, effective ways to clear airways, and other functional breathing techniques   Cleaning Equipment: - Provides group verbal and written instruction about the health risks of elevated stress, cause of high stress, and healthy ways to reduce stress.   Nutrition I: Fats: - Discuss the types of cholesterol, what cholesterol does to the body, and how cholesterol levels can be controlled.   PULMONARY REHAB OTHER RESPIRATORY from 10/04/2018 in [place]  Date  10/04/18  Educator  Wynetta Emery  Instruction Review Code  2- Demonstrated Understanding      Nutrition II: Labels: -Discuss the different components of food labels and how to read food labels.  Respiratory Infections: - Discuss the signs and symptoms of respiratory infections, ways to prevent respiratory infections, and the importance of seeking medical treatment when having a respiratory infection.   Stress I: Signs and Symptoms: - Discuss the causes of stress, how stress may lead to anxiety and depression, and ways to limit stress.   Stress II: Relaxation: -Discuss relaxation techniques to limit stress.   Oxygen for Home/Travel: - Discuss how to prepare for travel when on oxygen and proper ways to transport and store oxygen to ensure safety.   Knowledge Questionnaire Score: Knowledge Questionnaire Score - 09/26/18 1111      Knowledge Questionnaire Score   Pre Score  14/18       Core Components/Risk Factors/Patient Goals at  Admission: Personal Goals and Risk Factors at Admission - 09/26/18 1112      Core Components/Risk Factors/Patient Goals on Admission    Weight Management  Weight Maintenance    Personal Goal Other  Yes    Personal Goal  Breathe better, Get stronger    Intervention  Attend program 2 x week and supplement with at home exercise 3 x week    Expected Outcomes  Reach personal goals.        Core Components/Risk Factors/Patient Goals Review:    Core Components/Risk Factors/Patient Goals at Discharge (Final Review):    ITP Comments: ITP Comments    Row Name 09/26/18 1101 10/04/18 1522 10/25/18 1459       ITP Comments  Patient was reccomended by Dr. Woody Seller due to CHF. Due to patients EF being above 35% we have had to place him in our pulmonary program.  Patient is new to program. He has completed 3 sessions. Will continue to monitor for progress.   Patient has not attended since last 30 day review due to illness. Will continue to monitor for progress.         Comments: ITP REVIEW Patient has not attended since last 30 day review due to illness. Will continue to monitor for progress.

## 2018-10-29 ENCOUNTER — Ambulatory Visit: Payer: Medicare Other | Admitting: "Endocrinology

## 2018-10-29 ENCOUNTER — Telehealth: Payer: Self-pay | Admitting: Internal Medicine

## 2018-10-29 NOTE — Telephone Encounter (Signed)
Pt's wife left message on my voicemail that pt having "issues" and she would like for himto have his device checked. Pls advise [phone]

## 2018-10-29 NOTE — Telephone Encounter (Signed)
I called the patient back to have him send a manual transmission. I was unable to reach him by phone, however I did leave a voice message with my direct number to give me a call if he have any further questions.

## 2018-10-30 ENCOUNTER — Encounter (HOSPITAL_COMMUNITY): Payer: Medicare Other

## 2018-10-30 NOTE — Telephone Encounter (Signed)
Would try to leave his cardiac meds as is, he is on very low doses.

## 2018-10-30 NOTE — Telephone Encounter (Signed)
Transmission received. Normal device function. No episodes. PVC counter stable at 23/hour.  Patient's wife reports he has been having "weak spells" with occasional SBP in the 90's.  Also intermittent "irregular heart beat" when checking blood pressure at home.  I am hesitant to change meds without HF input. Will route to their team for review.  Chanetta Marshall, NP 10/30/2018 11:18 AM

## 2018-11-01 ENCOUNTER — Encounter (HOSPITAL_COMMUNITY): Payer: Medicare Other

## 2018-11-01 NOTE — Addendum Note (Signed)
Encounter addended by: Ferrel Logan on: 11/01/2018 3:24 PM  Actions taken: Flowsheet accepted, Visit Navigator Flowsheet section accepted

## 2018-11-05 ENCOUNTER — Telehealth: Payer: Self-pay | Admitting: Internal Medicine

## 2018-11-05 NOTE — Telephone Encounter (Signed)
New Message   Pt c/o BP issue: STAT if pt c/o blurred vision, one-sided weakness or slurred speech  1. What are your last 5 BP readings? [name] has been writing them down but doesn't have them in front of her  2. Are you having any other symptoms (ex. Dizziness, headache, blurred vision, passed out)? Dizziness, headache, blurred vision   3. What is your BP issue? [name] is calling because the pts BP is going up and down and they have some concerns   I did schedule an appt for them for 03/31 at 3:30 but the pt would like to be seen sooner

## 2018-11-06 ENCOUNTER — Encounter (HOSPITAL_COMMUNITY): Payer: Medicare Other

## 2018-11-06 NOTE — Telephone Encounter (Signed)
Spoke with pt's wife and she stated that pt's bp has been going and down. I advised wife to log his bp and let us know if the diastolic number gets under 60 or above 90. Wife assured me it has not reached any of those levels.

## 2018-11-06 NOTE — Telephone Encounter (Signed)
I'm sorry I don't understand what's going on. Need to call him and clarify his issue.

## 2018-11-06 NOTE — Telephone Encounter (Signed)
How is his systolic BP?

## 2018-11-06 NOTE — Telephone Encounter (Signed)
His systolic is ranging from 761 to 950 and diastolic 93-26.

## 2018-11-07 NOTE — Telephone Encounter (Signed)
He needs a RN visit for BP check and med reconciliation. Please ask him to bring his meds and make sure all of his doses are correct, and needs orthostatics.   If he has any stroke like symptoms, one-sided weakness, slurred speech, or if the blurred vision is persistent needs to be seen in ED.   [name] [address]" [place], [place] 11/07/2018 7:19 AM

## 2018-11-07 NOTE — Telephone Encounter (Signed)
Pt's wife notified. Wife stated that pt's bp was normal this morning. Wife will monitor pt's bp for the next few days and determine if he need RN visit. Wife also states that she is giving pt the extra dose of meds that was prescribed by Drs.

## 2018-11-08 ENCOUNTER — Encounter (HOSPITAL_COMMUNITY): Payer: Medicare Other

## 2018-11-08 NOTE — Addendum Note (Signed)
Encounter addended by: Dwana Melena, RN on: 11/08/2018 10:08 AM  Actions taken: Visit Navigator Flowsheet section accepted, Clinical Note Signed, Episode resolved

## 2018-11-08 NOTE — Progress Notes (Addendum)
Pulmonary Individual Treatment Plan  Patient Details  Name: [name] MRN: [id number] Date of Birth: [birth year] Referring Provider:     PULMONARY REHAB OTHER RESP ORIENTATION from 09/26/2018 in [facility]  Referring Provider  Vyas      Initial Encounter Date:    PULMONARY REHAB OTHER RESP ORIENTATION from 09/26/2018 in [facility]  Date  09/26/18      Visit Diagnosis: Chronic systolic CHF (congestive heart failure) (HCC)  Patient's Home Medications on Admission:   Current Outpatient Medications:  .  aspirin EC 81 MG tablet, Take 81 mg by mouth at bedtime., Disp: , Rfl:  .  bisoprolol (ZEBETA) 5 MG tablet, Take 0.5 tablet (2.5 mg total) by mouth once a day., Disp: 30 tablet, Rfl: 3 .  Calcium Carb-Cholecalciferol (CALCIUM 500 + D3 PO), Take by mouth., Disp: , Rfl:  .  Cyanocobalamin (VITAMIN B-12 IJ), Inject as directed every 30 (thirty) days., Disp: , Rfl:  .  eplerenone (INSPRA) 25 MG tablet, Take 1 tablet (25 mg total) by mouth daily. Take at bed time, Disp: 45 tablet, Rfl: 3 .  fluticasone (FLONASE) 50 MCG/ACT nasal spray, Place 1 spray into both nostrils daily as needed., Disp: , Rfl:  .  furosemide (LASIX) 20 MG tablet, Take 3 tablets (60 mg total) by mouth every morning., Disp: 120 tablet, Rfl: 2 .  levofloxacin (LEVAQUIN) 500 MG tablet, Take 500 mg by mouth daily., Disp: , Rfl: 0 .  levothyroxine (SYNTHROID, LEVOTHROID) 112 MCG tablet, Take 1 tablet (112 mcg total) by mouth daily before breakfast. (Patient taking differently: Take 125 mcg by mouth daily before breakfast. ), Disp: 90 tablet, Rfl: 2 .  losartan (COZAAR) 25 MG tablet, Take 0.5 tablets (12.5 mg total) by mouth at bedtime., Disp: 30 tablet, Rfl: 11 .  memantine (NAMENDA) 10 MG tablet, TAKE 1 TABLET BY MOUTH TWICE A DAY (Patient not taking: Reported on 09/26/2018), Disp: 180 tablet, Rfl: 4 .  Polyethyl Glycol-Propyl Glycol (LUBRICANT EYE DROPS) 0.4-0.3 % SOLN, Place  1-2 drops into both eyes 2 (two) times daily as needed (for dry/irritated eyes.)., Disp: , Rfl:  .  potassium chloride SA (K-DUR,KLOR-CON) 20 MEQ tablet, Take 1 tablet (20 mEq total) by mouth daily., Disp: 90 tablet, Rfl: 3 .  predniSONE (DELTASONE) 5 MG tablet, Take 2.5 mg by mouth 2 (two) times daily with a meal., Disp: , Rfl:  .  simvastatin (ZOCOR) 20 MG tablet, Take 1 tablet (20 mg total) by mouth at bedtime., Disp: 90 tablet, Rfl: 3  Past Medical History: Past Medical History:  Diagnosis Date  . AICD (automatic cardioverter/defibrillator) present   . Aortic insufficiency     moderate aortic ins moderate/asymptomatic/normal LV cavity size.   . Carotid artery disease ([place])     less than 50% stenosis bilaterally  . Carotid artery disease ([place])   . Chronic systolic CHF (congestive heart failure) ([place])   . Coronary artery disease   . Drug-induced gynecomastia     secondary to spironolactone  . Dyslipidemia   . Hypertension   . ICD (implantable cardiac defibrillator), biventricular, in situ     [employer]tronic model  [id number]  . Ischemic cardiomyopathy     status post non-ST elevation microinfarction stent to the right coronary artery is a   . LV dysfunction     NYHA class I/prior ejection fraction 20-25% and an ejection fraction 7 2009 50-55%, ejection fraction 60% bedside echocardiogram July 2012   . PONV (postoperative nausea and  vomiting)   . Pulmonary embolism (HCC)    Prior history of pulmonary embolism off Coumadin.  . 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 Packs/day: 1.00  . Years: 43.00  . Pack years: 43.00  . Types: Cigarettes  . Start date: 10/17/1957  . Last attempt to quit: 12/13/2000  . Years since quitting: 17.9  Smokeless Tobacco Never Used  Tobacco Comment   started  at age 23    Labs: Recent Review Flowsheet Data    Labs for ITP Cardiac and Pulmonary Rehab Latest Ref Rng & Units 12/10/2015 12/10/2015 10/08/2016   HCO3 20.0 - 24.0 mEq/L 31.0(H) 28.9(H) -   TCO2 0 - 100 mmol/L 33 30 33   O2SAT % 64.0 96.0 -      Capillary Blood Glucose: Lab Results  Component Value Date   GLUCAP 88 02/03/2011   GLUCAP 81 01/31/2011     Pulmonary Assessment Scores: Pulmonary Assessment Scores    Row Name 09/26/18 1106         ADL UCSD   SOB Score total  26     Rest  0     Walk  7     Stairs  4     Bath  0     Dress  0     Shop  1       CAT Score   CAT Score  23       mMRC Score   mMRC Score  4        Pulmonary Function Assessment:   Exercise Target Goals: Exercise Program Goal: Individual exercise prescription set using results from initial 6 min walk test and THRR while considering  patient's activity barriers and safety.   Exercise Prescription Goal: Initial exercise prescription builds to 30-45 minutes a day of aerobic activity, 2-3 days per week.  Home exercise guidelines will be given to patient during program as part of exercise prescription that the participant will acknowledge.  Activity Barriers & Risk Stratification: Activity Barriers & Cardiac Risk Stratification - 09/26/18 1044      Activity Barriers & Cardiac Risk Stratification   Activity Barriers  Muscular Weakness;Shortness of Breath;Other (comment)    Comments  L knee pain due to gout     Cardiac Risk Stratification  High       6 Minute Walk: 6 Minute Walk    Row Name 09/26/18 1043         6 Minute Walk   Phase  Initial     Distance  1150 feet     Walk Time  6 minutes     # of Rest Breaks  0     MPH  2.18     METS  2.67     RPE  9     Perceived Dyspnea   13     VO2 Peak  9.03     Symptoms  No     Resting HR  60 bpm     Resting BP  118/60     Resting Oxygen Saturation   95 %     Exercise Oxygen Saturation  during 6 min walk  91 %     Max Ex. HR  75  bpm     Max Ex. BP  130/60  2 Minute Post BP  116/60        Oxygen Initial Assessment: Oxygen Initial Assessment - 09/26/18 1105      Home Oxygen   Home Oxygen Device  None       Oxygen Re-Evaluation:   Oxygen Discharge (Final Oxygen Re-Evaluation):   Initial Exercise Prescription: Initial Exercise Prescription - 09/26/18 1000      Date of Initial Exercise RX and Referring Provider   Date  09/26/18    Referring Provider  Vyas    Expected Discharge Date  12/26/18      Treadmill   MPH  0.8    Grade  0    Minutes  17    METs  1.61      NuStep   Level  1    SPM  45    Minutes  22    METs  1.6      Prescription Details   Frequency (times per week)  2    Duration  Progress to 30 minutes of continuous aerobic without signs/symptoms of physical distress      Intensity   THRR 40-80% of Max Heartrate  93-110-127    Ratings of Perceived Exertion  11-13    Perceived Dyspnea  0-4      Progression   Progression  Continue to progress workloads to maintain intensity without signs/symptoms of physical distress.      Resistance Training   Training Prescription  Yes    Weight  1    Reps  10-15       Perform Capillary Blood Glucose checks as needed.  Exercise Prescription Changes:  Exercise Prescription Changes    Row Name 09/26/18 1000 10/04/18 0800 10/18/18 1200 11/01/18 1500       Response to Exercise   Blood Pressure (Admit)  118/60  130/62  160/70  -    Blood Pressure (Exercise)  130/60  144/78  140/70  -    Blood Pressure (Exit)  116/60  122/70  140/60  -    Heart Rate (Admit)  60 bpm  89 bpm  71 bpm  -    Heart Rate (Exercise)  75 bpm  91 bpm  87 bpm  -    Heart Rate (Exit)  64 bpm  67 bpm  87 bpm  -    Oxygen Saturation (Admit)  95 %  94 %  92 %  -    Oxygen Saturation (Exercise)  91 %  90 %  94 %  -    Oxygen Saturation (Exit)  95 %  92 %  96 %  -    Rating of Perceived Exertion (Exercise)  9  11  11   -    Perceived Dyspnea (Exercise)  13  11  11    -    Comments  6 minute walk test  First exercise session   Third exercise session, has not returned since 10/04/2018  Pt. has not been since 10/04/2018    Duration  Progress to 30 minutes of  aerobic without signs/symptoms of physical distress  Progress to 30 minutes of  aerobic without signs/symptoms of physical distress  Continue with 30 min of aerobic exercise without signs/symptoms of physical distress.  -    Intensity  THRR New 93-110-127  THRR unchanged  THRR unchanged  -      Progression   Progression  -  -  Continue to progress workloads to maintain intensity without signs/symptoms of physical distress.  -  Average METs  -  -  2.05  -      Resistance Training   Training Prescription  -  Yes  Yes  -    Weight  -  1  1  -    Reps  -  10-15  10-15  -      Treadmill   MPH  -  1  1  -    Grade  -  0  0  -    Minutes  -  17  17  -    METs  -  1.76  1.8  -      NuStep   Level  -  1  1  -    SPM  -  80  84  -    Minutes  -  22  22  -    METs  -  1.8  1.8  -      Home Exercise Plan   Plans to continue exercise at  Home (comment) walking  Home (comment)  Home (comment)  -    Frequency  Add 3 additional days to program exercise sessions.  Add 3 additional days to program exercise sessions.  Add 3 additional days to program exercise sessions.  -    Initial Home Exercises Provided  09/26/18  09/26/18  09/26/18  -       Exercise Comments:  Exercise Comments    Row Name 10/04/18 0803 10/24/18 1452 11/01/18 1523       Exercise Comments  Pt. has attended one exercise session so far. He tolerated the exercise well. We will continue to monitor his progress and increase him as tolerated.   Pt. has attended 3 exercise sessions so far, but has not been here since 10/04/2018. Once he returns we will adjust his exercise as needed for tolerance and continue to monitor and progress.  Pt. has attended 3 exercise sessions. Has not returned since 10/04/2018 due to sickness.        Exercise  Goals and Review:  Exercise Goals    Row Name 09/26/18 1046             Exercise Goals   Increase Physical Activity  Yes       Intervention  Provide advice, education, support and counseling about physical activity/exercise needs.;Develop an individualized exercise prescription for aerobic and resistive training based on initial evaluation findings, risk stratification, comorbidities and participant's personal goals.       Expected Outcomes  Short Term: Attend rehab on a regular basis to increase amount of physical activity.       Increase Strength and Stamina  Yes       Intervention  Provide advice, education, support and counseling about physical activity/exercise needs.;Develop an individualized exercise prescription for aerobic and resistive training based on initial evaluation findings, risk stratification, comorbidities and participant's personal goals.       Expected Outcomes  Short Term: Increase workloads from initial exercise prescription for resistance, speed, and METs.       Able to understand and use rate of perceived exertion (RPE) scale  Yes       Intervention  Provide education and explanation on how to use RPE scale       Expected Outcomes  Short Term: Able to use RPE daily in rehab to express subjective intensity level;Long Term:  Able to use RPE to guide intensity level when exercising independently       Able to understand  and use Dyspnea scale  Yes       Intervention  Provide education and explanation on how to use Dyspnea scale       Expected Outcomes  Short Term: Able to use Dyspnea scale daily in rehab to express subjective sense of shortness of breath during exertion;Long Term: Able to use Dyspnea scale to guide intensity level when exercising independently       Knowledge and understanding of Target Heart Rate Range (THRR)  Yes       Intervention  Provide education and explanation of THRR including how the numbers were predicted and where they are located for  reference       Expected Outcomes  Short Term: Able to state/look up THRR       Able to check pulse independently  Yes       Intervention  Review the importance of being able to check your own pulse for safety during independent exercise       Expected Outcomes  Short Term: Able to explain why pulse checking is important during independent exercise;Long Term: Able to check pulse independently and accurately       Understanding of Exercise Prescription  Yes       Intervention  Provide education, explanation, and written materials on patient's individual exercise prescription       Expected Outcomes  Short Term: Able to explain program exercise prescription;Long Term: Able to explain home exercise prescription to exercise independently          Exercise Goals Re-Evaluation : Exercise Goals Re-Evaluation    Row Name 10/04/18 0802 10/24/18 1447           Exercise Goal Re-Evaluation   Exercise Goals Review  Increase Physical Activity;Increase Strength and Stamina;Able to understand and use rate of perceived exertion (RPE) scale;Able to understand and use Dyspnea scale;Knowledge and understanding of Target Heart Rate Range (THRR);Able to check pulse independently;Understanding of Exercise Prescription  Increase Physical Activity;Increase Strength and Stamina;Able to understand and use rate of perceived exertion (RPE) scale;Able to understand and use Dyspnea scale;Knowledge and understanding of Target Heart Rate Range (THRR);Able to check pulse independently;Understanding of Exercise Prescription      Comments  Pt. has attended one exercise session so far. He tolerated the exercise well. We will continue to monitor his progress.   Pt. is still new to the program. He has attended 3 sessions so far, but has not returned since 10/04/2018. When he returns we will adjust prescription as needed, and monitor him to progress in the future.       Expected Outcomes  breathe better and increase strength   breathe  better and increase strength          Discharge Exercise Prescription (Final Exercise Prescription Changes): Exercise Prescription Changes - 11/01/18 1500      Response to Exercise   Comments  Pt. has not been since 10/04/2018       Nutrition:  Target Goals: Understanding of nutrition guidelines, daily intake of sodium 1500mg , cholesterol 200mg , calories 30% from fat and 7% or less from saturated fats, daily to have 5 or more servings of fruits and vegetables.  Biometrics: Pre Biometrics - 09/26/18 1046      Pre Biometrics   Height  5\' 9"  (1.753 m)    Weight  60.3 kg    Waist Circumference  31.5 inches    Hip Circumference  34 inches    Waist to Hip Ratio  0.93 %  BMI (Calculated)  19.62    Triceps Skinfold  4 mm    % Body Fat  15.7 %    Grip Strength  14.7 kg    Flexibility  0 in    Single Leg Stand  3.09 seconds        Nutrition Therapy Plan and Nutrition Goals: Nutrition Therapy & Goals - 09/26/18 1110      Personal Nutrition Goals   Personal Goal #2  Is following his low sodium, heart healthy diet.     Additional Goals?  No       Nutrition Assessments: Nutrition Assessments - 09/26/18 1111      MEDFICTS Scores   Pre Score  15       Nutrition Goals Re-Evaluation:   Nutrition Goals Discharge (Final Nutrition Goals Re-Evaluation):   Psychosocial: Target Goals: Acknowledge presence or absence of significant depression and/or stress, maximize coping skills, provide positive support system. Participant is able to verbalize types and ability to use techniques and skills needed for reducing stress and depression.  Initial Review & Psychosocial Screening: Initial Psych Review & Screening - 09/26/18 1108      Initial Review   Current issues with  Current Depression   His depression is mild and concering his recent health problems because he can't work     [place]?  Yes      Barriers   Psychosocial barriers to  participate in program  Psychosocial barriers identified (see note)      Screening Interventions   Interventions  Encouraged to exercise    Expected Outcomes  Short Term goal: Identification and review with participant of any Quality of Life or Depression concerns found by scoring the questionnaire.;Long Term goal: The participant improves quality of Life and PHQ9 Scores as seen by post scores and/or verbalization of changes       Quality of Life Scores: Quality of Life - 09/26/18 1047      Quality of Life   Select  Quality of Life      Quality of Life Scores   Health/Function Pre  13.71 %    Socioeconomic Pre  19.71 %    Psych/Spiritual Pre  23.14 %    Family Pre  30 %    GLOBAL Pre  19.45 %      Scores of 19 and below usually indicate a poorer quality of life in these areas.  A difference of  2-3 points is a clinically meaningful difference.  A difference of 2-3 points in the total score of the Quality of Life Index has been associated with significant improvement in overall quality of life, self-image, physical symptoms, and general health in studies assessing change in quality of life.   PHQ-9: Recent Review Flowsheet Data    Depression screen [facility] 2/9 09/26/2018 04/26/2018   Decreased Interest 0 0   Down, Depressed, Hopeless 1 0   PHQ - 2 Score 1 0   Altered sleeping 0 -   Tired, decreased energy 1 -   Change in appetite 0 -   Feeling bad or failure about yourself  0 -   Trouble concentrating 0 -   Moving slowly or fidgety/restless 0 -   Suicidal thoughts 0 -   PHQ-9 Score 2 -   Difficult doing work/chores Not difficult at all -     Interpretation of Total Score  Total Score Depression Severity:  1-4 = Minimal depression, 5-9 = Mild depression, 10-14 = Moderate  depression, 15-19 = Moderately severe depression, 20-27 = Severe depression   Psychosocial Evaluation and Intervention: Psychosocial Evaluation - 09/26/18 1110      Psychosocial Evaluation & Interventions    Interventions  Encouraged to exercise with the program and follow exercise prescription    Continue Psychosocial Services   Follow up required by staff       Psychosocial Re-Evaluation:   Psychosocial Discharge (Final Psychosocial Re-Evaluation):    Education: Education Goals: Education classes will be provided on a weekly basis, covering required topics. Participant will state understanding/return demonstration of topics presented.  Learning Barriers/Preferences: Learning Barriers/Preferences - 09/26/18 1020      Learning Barriers/Preferences   Learning Barriers  None    Learning Preferences  Written Material;Pictoral;Video       Education Topics: How Lungs Work and Diseases: - Discuss the anatomy of the lungs and diseases that can affect the lungs, such as COPD.   Exercise: -Discuss the importance of exercise, FITT principles of exercise, normal and abnormal responses to exercise, and how to exercise safely.   Environmental Irritants: -Discuss types of environmental irritants and how to limit exposure to environmental irritants.   Meds/Inhalers and oxygen: - Discuss respiratory medications, definition of an inhaler and oxygen, and the proper way to use an inhaler and oxygen.   Energy Saving Techniques: - Discuss methods to conserve energy and decrease shortness of breath when performing activities of daily living.    Bronchial Hygiene / Breathing Techniques: - Discuss breathing mechanics, pursed-lip breathing technique,  proper posture, effective ways to clear airways, and other functional breathing techniques   Cleaning Equipment: - Provides group verbal and written instruction about the health risks of elevated stress, cause of high stress, and healthy ways to reduce stress.   Nutrition I: Fats: - Discuss the types of cholesterol, what cholesterol does to the body, and how cholesterol levels can be controlled.   PULMONARY REHAB OTHER RESPIRATORY from  10/04/2018 in [facility]  Date  10/04/18  Educator  Wynetta Emery  Instruction Review Code  2- Demonstrated Understanding      Nutrition II: Labels: -Discuss the different components of food labels and how to read food labels.   Respiratory Infections: - Discuss the signs and symptoms of respiratory infections, ways to prevent respiratory infections, and the importance of seeking medical treatment when having a respiratory infection.   Stress I: Signs and Symptoms: - Discuss the causes of stress, how stress may lead to anxiety and depression, and ways to limit stress.   Stress II: Relaxation: -Discuss relaxation techniques to limit stress.   Oxygen for Home/Travel: - Discuss how to prepare for travel when on oxygen and proper ways to transport and store oxygen to ensure safety.   Knowledge Questionnaire Score: Knowledge Questionnaire Score - 09/26/18 1111      Knowledge Questionnaire Score   Pre Score  14/18       Core Components/Risk Factors/Patient Goals at Admission: Personal Goals and Risk Factors at Admission - 09/26/18 1112      Core Components/Risk Factors/Patient Goals on Admission    Weight Management  Weight Maintenance    Personal Goal Other  Yes    Personal Goal  Breathe better, Get stronger    Intervention  Attend program 2 x week and supplement with at home exercise 3 x week    Expected Outcomes  Reach personal goals.        Core Components/Risk Factors/Patient Goals Review:    Core Components/Risk Factors/Patient Goals  at Discharge (Final Review):    ITP Comments: ITP Comments    Row Name 09/26/18 1101 10/04/18 1522 10/25/18 1459 11/08/18 1004     ITP Comments  Patient was reccomended by Dr. Woody Seller due to CHF. Due to patients EF being above 35% we have had to place him in our pulmonary program.  Patient is new to program. He has completed 3 sessions. Will continue to monitor for progress.   Patient has not attended since last 30  day review due to illness. Will continue to monitor for progress.   Patient stopped attending after completing 3 sessions. He was discharged due to lack of attendance. MD will be notified.        Comments: Patient stopped coming to Pulmonary  on 10/04/18 after completing 3 sesisons. Called patient to remind them of the Cardiac Rehab policy that if they do not call or come for 3 consecutive visits that they would be discharged from the CR program. Doctor will be informed.

## 2018-11-08 NOTE — Progress Notes (Signed)
Discharge Progress Report  Patient Details  Name: [name] MRN: [id number] Date of Birth: [birth year] Referring Provider:     PULMONARY REHAB OTHER RESP ORIENTATION from 09/26/2018 in [place]  Referring Provider  [place]       Number of Visits: 3  Reason for Discharge:  Early Exit:  Lack of attendance  Smoking History:  Social History   Tobacco Use  Smoking Status Former Smoker  . Packs/day: 1.00  . Years: 43.00  . Pack years: 43.00  . Types: Cigarettes  . Start date: 10/17/1957  . Last attempt to quit: 12/13/2000  . Years since quitting: 17.9  Smokeless Tobacco Never Used  Tobacco Comment   started at age 78    Diagnosis:  Chronic systolic CHF (congestive heart failure) ([place])  ADL [facility]: Pulmonary Assessment Scores    Row Name 09/26/18 1106         ADL [facility]   SOB Score total  26     Rest  0     Walk  7     Stairs  4     Bath  0     Dress  0     Shop  1       CAT Score   CAT Score  23       mMRC Score   mMRC Score  4        Initial Exercise Prescription: Initial Exercise Prescription - 09/26/18 1000      Date of Initial Exercise RX and Referring Provider   Date  09/26/18    Referring Provider  Vyas    Expected Discharge Date  12/26/18      Treadmill   MPH  0.8    Grade  0    Minutes  17    METs  1.61      NuStep   Level  1    SPM  45    Minutes  22    METs  1.6      Prescription Details   Frequency (times per week)  2    Duration  Progress to 30 minutes of continuous aerobic without signs/symptoms of physical distress      Intensity   THRR 40-80% of Max Heartrate  93-110-127    Ratings of Perceived Exertion  11-13    Perceived Dyspnea  0-4      Progression   Progression  Continue to progress workloads to maintain intensity without signs/symptoms of physical distress.      Resistance Training   Training Prescription  Yes    Weight  1    Reps  10-15       Discharge Exercise Prescription (Final  Exercise Prescription Changes): Exercise Prescription Changes - 11/01/18 1500      Response to Exercise   Comments  Pt. has not been since 10/04/2018       Functional Capacity: 6 Minute Walk    Row Name 09/26/18 1043         6 Minute Walk   Phase  Initial     Distance  1150 feet     Walk Time  6 minutes     # of Rest Breaks  0     MPH  2.18     METS  2.67     RPE  9     Perceived Dyspnea   13     VO2 Peak  9.03     Symptoms  No  Resting HR  60 bpm     Resting BP  118/60     Resting Oxygen Saturation   95 %     Exercise Oxygen Saturation  during 6 min walk  91 %     Max Ex. HR  75 bpm     Max Ex. BP  130/60     2 Minute Post BP  116/60        Psychological, QOL, Others - Outcomes: PHQ 2/9: Depression screen [facility] 2/9 09/26/2018 04/26/2018  Decreased Interest 0 0  Down, Depressed, Hopeless 1 0  PHQ - 2 Score 1 0  Altered sleeping 0 -  Tired, decreased energy 1 -  Change in appetite 0 -  Feeling bad or failure about yourself  0 -  Trouble concentrating 0 -  Moving slowly or fidgety/restless 0 -  Suicidal thoughts 0 -  PHQ-9 Score 2 -  Difficult doing work/chores Not difficult at all -  Some recent data might be hidden    Quality of Life: Quality of Life - 09/26/18 1047      Quality of Life   Select  Quality of Life      Quality of Life Scores   Health/Function Pre  13.71 %    Socioeconomic Pre  19.71 %    Psych/Spiritual Pre  23.14 %    Family Pre  30 %    GLOBAL Pre  19.45 %       Personal Goals: Goals established at orientation with interventions provided to work toward goal. Personal Goals and Risk Factors at Admission - 09/26/18 1112      Core Components/Risk Factors/Patient Goals on Admission    Weight Management  Weight Maintenance    Personal Goal Other  Yes    Personal Goal  Breathe better, Get stronger    Intervention  Attend program 2 x week and supplement with at home exercise 3 x week    Expected Outcomes  Reach personal goals.          Personal Goals Discharge:   Exercise Goals and Review: Exercise Goals    Row Name 09/26/18 1046             Exercise Goals   Increase Physical Activity  Yes       Intervention  Provide advice, education, support and counseling about physical activity/exercise needs.;Develop an individualized exercise prescription for aerobic and resistive training based on initial evaluation findings, risk stratification, comorbidities and participant's personal goals.       Expected Outcomes  Short Term: Attend rehab on a regular basis to increase amount of physical activity.       Increase Strength and Stamina  Yes       Intervention  Provide advice, education, support and counseling about physical activity/exercise needs.;Develop an individualized exercise prescription for aerobic and resistive training based on initial evaluation findings, risk stratification, comorbidities and participant's personal goals.       Expected Outcomes  Short Term: Increase workloads from initial exercise prescription for resistance, speed, and METs.       Able to understand and use rate of perceived exertion (RPE) scale  Yes       Intervention  Provide education and explanation on how to use RPE scale       Expected Outcomes  Short Term: Able to use RPE daily in rehab to express subjective intensity level;Long Term:  Able to use RPE to guide intensity level when exercising independently  Able to understand and use Dyspnea scale  Yes       Intervention  Provide education and explanation on how to use Dyspnea scale       Expected Outcomes  Short Term: Able to use Dyspnea scale daily in rehab to express subjective sense of shortness of breath during exertion;Long Term: Able to use Dyspnea scale to guide intensity level when exercising independently       Knowledge and understanding of Target Heart Rate Range (THRR)  Yes       Intervention  Provide education and explanation of THRR including how the numbers were  predicted and where they are located for reference       Expected Outcomes  Short Term: Able to state/look up THRR       Able to check pulse independently  Yes       Intervention  Review the importance of being able to check your own pulse for safety during independent exercise       Expected Outcomes  Short Term: Able to explain why pulse checking is important during independent exercise;Long Term: Able to check pulse independently and accurately       Understanding of Exercise Prescription  Yes       Intervention  Provide education, explanation, and written materials on patient's individual exercise prescription       Expected Outcomes  Short Term: Able to explain program exercise prescription;Long Term: Able to explain home exercise prescription to exercise independently          Exercise Goals Re-Evaluation: Exercise Goals Re-Evaluation    Row Name 10/04/18 0802 10/24/18 1447           Exercise Goal Re-Evaluation   Exercise Goals Review  Increase Physical Activity;Increase Strength and Stamina;Able to understand and use rate of perceived exertion (RPE) scale;Able to understand and use Dyspnea scale;Knowledge and understanding of Target Heart Rate Range (THRR);Able to check pulse independently;Understanding of Exercise Prescription  Increase Physical Activity;Increase Strength and Stamina;Able to understand and use rate of perceived exertion (RPE) scale;Able to understand and use Dyspnea scale;Knowledge and understanding of Target Heart Rate Range (THRR);Able to check pulse independently;Understanding of Exercise Prescription      Comments  Pt. has attended one exercise session so far. He tolerated the exercise well. We will continue to monitor his progress.   Pt. is still new to the program. He has attended 3 sessions so far, but has not returned since 10/04/2018. When he returns we will adjust prescription as needed, and monitor him to progress in the future.       Expected Outcomes   breathe better and increase strength   breathe better and increase strength          Nutrition & Weight - Outcomes: Pre Biometrics - 09/26/18 1046      Pre Biometrics   Height  5\' 9"  (1.753 m)    Weight  60.3 kg    Waist Circumference  31.5 inches    Hip Circumference  34 inches    Waist to Hip Ratio  0.93 %    BMI (Calculated)  19.62    Triceps Skinfold  4 mm    % Body Fat  15.7 %    Grip Strength  14.7 kg    Flexibility  0 in    Single Leg Stand  3.09 seconds        Nutrition: Nutrition Therapy & Goals - 09/26/18 1110      Personal Nutrition Goals  Personal Goal #2  Is following his low sodium, heart healthy diet.     Additional Goals?  No       Nutrition Discharge: Nutrition Assessments - 09/26/18 1111      MEDFICTS Scores   Pre Score  15       Education Questionnaire Score: Knowledge Questionnaire Score - 09/26/18 1111      Knowledge Questionnaire Score   Pre Score  14/18

## 2018-11-10 LAB — CUP PACEART REMOTE DEVICE CHECK
Battery Remaining Longevity: 33 mo
Battery Voltage: 2.98 V
Brady Statistic AP VP Percent: 73.7 %
Brady Statistic AP VS Percent: 0.04 %
Brady Statistic AS VP Percent: 26.18 %
Brady Statistic RA Percent Paced: 72.8 %
Brady Statistic RV Percent Paced: 98.86 %
Date Time Interrogation Session: 20191216174313
Implantable Lead Implant Date: 20050810
Implantable Lead Implant Date: 20050810
Implantable Lead Implant Date: 20071015
Implantable Lead Location: 753858
Implantable Lead Location: 753859
Implantable Lead Location: 753860
Implantable Lead Model: 5076
Implantable Lead Model: 6949
Implantable Pulse Generator Implant Date: 20170718
Lead Channel Impedance Value: 247 Ohm
Lead Channel Impedance Value: 380 Ohm
Lead Channel Impedance Value: 456 Ohm
Lead Channel Impedance Value: 494 Ohm
Lead Channel Impedance Value: 513 Ohm
Lead Channel Impedance Value: 570 Ohm
Lead Channel Impedance Value: 570 Ohm
Lead Channel Impedance Value: 665 Ohm
Lead Channel Pacing Threshold Amplitude: 0.625 V
Lead Channel Pacing Threshold Amplitude: 1.625 V
Lead Channel Pacing Threshold Pulse Width: 0.4 ms
Lead Channel Sensing Intrinsic Amplitude: 28.25 mV
Lead Channel Sensing Intrinsic Amplitude: 28.25 mV
Lead Channel Sensing Intrinsic Amplitude: 4 mV
Lead Channel Sensing Intrinsic Amplitude: 4 mV
Lead Channel Setting Pacing Amplitude: 1.5 V
Lead Channel Setting Pacing Amplitude: 3 V
Lead Channel Setting Pacing Amplitude: 3.75 V
Lead Channel Setting Pacing Pulse Width: 0.6 ms
Lead Channel Setting Pacing Pulse Width: 1 ms
Lead Channel Setting Sensing Sensitivity: 2.8 mV
MDC IDC MSMT LEADCHNL RA IMPEDANCE VALUE: 456 Ohm
MDC IDC MSMT LEADCHNL RA PACING THRESHOLD PULSEWIDTH: 0.4 ms
MDC IDC STAT BRADY AS VS PERCENT: 0.08 %

## 2018-11-13 ENCOUNTER — Encounter (HOSPITAL_COMMUNITY): Payer: Medicare Other

## 2018-11-15 ENCOUNTER — Encounter (HOSPITAL_COMMUNITY): Payer: Medicare Other

## 2018-11-19 ENCOUNTER — Encounter (INDEPENDENT_AMBULATORY_CARE_PROVIDER_SITE_OTHER): Payer: Self-pay | Admitting: *Deleted

## 2018-11-19 ENCOUNTER — Telehealth (INDEPENDENT_AMBULATORY_CARE_PROVIDER_SITE_OTHER): Payer: Self-pay | Admitting: *Deleted

## 2018-11-19 NOTE — Progress Notes (Signed)
No ICM remote transmission received for 11/05/2018 and next ICM transmission scheduled for 12/04/2018.

## 2018-11-19 NOTE — Telephone Encounter (Signed)
Patient needs trilyte 

## 2018-11-20 ENCOUNTER — Telehealth (INDEPENDENT_AMBULATORY_CARE_PROVIDER_SITE_OTHER): Payer: Self-pay | Admitting: *Deleted

## 2018-11-20 ENCOUNTER — Encounter (HOSPITAL_COMMUNITY): Payer: Medicare Other

## 2018-11-20 NOTE — Telephone Encounter (Signed)
Referring MD/PCP: vyas   Procedure: tcs  Reason/Indication:  screening  Has patient had this procedure before?  no  If so, when, by whom and where?    Is there a family history of colon cancer?  no  Who?  What age when diagnosed?    Is patient diabetic?   no      Does patient have prosthetic heart valve or mechanical valve?  no  Do you have a pacemaker?  no  Has patient ever had endocarditis? no  Has patient had joint replacement within last 12 months?  no  Is patient constipated or do they take laxatives? no  Does patient have a history of alcohol/drug use?  no  Is patient on blood thinner such as Coumadin, Plavix and/or Aspirin? yes  Medications: asa 81 mg daily, bisoprolol 5 mg 1/2 tab daily, simvastatin 20 mg daily, vit b12 daily, calcium daily, potassium daily, losartan 25 mg 1/2 tab daily, levothyroxine 112 mcg daily, lasix 60 mg daily, eplerenone 25 mg daily  Allergies: spironolactone  Medication Adjustment per Dr Lindi Adie, NP: asa 2 days  Procedure date & time: 12/20/18 at 730

## 2018-11-22 ENCOUNTER — Encounter (HOSPITAL_COMMUNITY): Payer: Medicare Other

## 2018-11-22 MED ORDER — PEG 3350-KCL-NA BICARB-NACL 420 G PO SOLR: 4000.0000 mL | Freq: Once | ORAL | 0 refills | Status: AC

## 2018-11-22 NOTE — Telephone Encounter (Signed)
agree

## 2018-11-27 ENCOUNTER — Encounter (HOSPITAL_COMMUNITY): Payer: Medicare Other

## 2018-11-29 ENCOUNTER — Encounter (HOSPITAL_COMMUNITY): Payer: Medicare Other

## 2018-12-04 ENCOUNTER — Ambulatory Visit (INDEPENDENT_AMBULATORY_CARE_PROVIDER_SITE_OTHER): Payer: Medicare Other

## 2018-12-04 ENCOUNTER — Encounter (HOSPITAL_COMMUNITY): Payer: Medicare Other

## 2018-12-04 DIAGNOSIS — I5022 Chronic systolic (congestive) heart failure: Secondary | ICD-10-CM

## 2018-12-04 DIAGNOSIS — Z950 Presence of cardiac pacemaker: Secondary | ICD-10-CM

## 2018-12-05 ENCOUNTER — Telehealth: Payer: Self-pay

## 2018-12-05 NOTE — Telephone Encounter (Signed)
Left message for patient to remind of missed remote transmission.  

## 2018-12-05 NOTE — Progress Notes (Signed)
EPIC Encounter for ICM Monitoring  Patient Name: [name] is a 78 y.o. male Date: 12/05/2018 Primary Care Physican: Glenda Chroman, MD Primary [facility] Electrophysiologist: Santina Evans Pacing:98.1% Last Weight:128lbs  Today's Weight:unknown   Spoke with wife.  She reports patient is doing fine.  Thoracic impedance normal.   Prescribed:Furosemide20 mg3tablets (60 mg total)daily.Potassium 20 mEq take 1 tablet daily.  Labs: 09/04/2018 Creatinine 1.06, BUN 16, Potassium 4.0, Sodium 139, eGFR >60 08/16/2018 Creatinine 1.03, BUN 15, Potassium 4.1, Sodium 139, eGFR >60 08/01/2018 Creatinine 1.07, BUN 19, Potassium 4.0, Sodium 142, eGFR >60 06/21/2018 Creatinine 1.26, BUN 17, Potassium 3.7, Sodium 144, eGFR 54->60 06/11/2018 Creatinine 1.00, BUN 20, Potassium 4.0, Sodium 145, eGFR >60 03/16/2018 Creatinine 0.97, BUN 21, Potassium 4.3, Sodium 141, EGFR >60 A complete set of results can be found in Results Review.  Recommendations:No changes and encouraged to call for fluid symptoms  Follow-up plan: ICM clinic phone appointment on3/24/2020. Office visit 12/06/2018 with Dr Aundra Dubin and 01/15/2019 with Dr Lovena Le (BP follow up).  Copy of ICM check sent to Dr.Taylor   3 month ICM trend: 12/05/2018    1 Year ICM trend:       Rosalene Billings, RN 12/05/2018 12:07 PM

## 2018-12-06 ENCOUNTER — Ambulatory Visit (HOSPITAL_COMMUNITY)
Admission: RE | Admit: 2018-12-06 | Discharge: 2018-12-06 | Disposition: A | Discharge status: Home / Self Care | Payer: Medicare Other | Source: Ambulatory Visit | Attending: Cardiology | Admitting: Cardiology

## 2018-12-06 ENCOUNTER — Encounter (HOSPITAL_COMMUNITY): Payer: Self-pay | Admitting: Cardiology

## 2018-12-06 ENCOUNTER — Encounter (HOSPITAL_COMMUNITY): Payer: Medicare Other

## 2018-12-06 VITALS — BP 150/78 | HR 70 | Wt 134.2 lb

## 2018-12-06 DIAGNOSIS — I471 Supraventricular tachycardia: Secondary | ICD-10-CM | POA: Insufficient documentation

## 2018-12-06 DIAGNOSIS — Z7982 Long term (current) use of aspirin: Secondary | ICD-10-CM | POA: Diagnosis not present

## 2018-12-06 DIAGNOSIS — I5022 Chronic systolic (congestive) heart failure: Secondary | ICD-10-CM

## 2018-12-06 DIAGNOSIS — I272 Pulmonary hypertension, unspecified: Secondary | ICD-10-CM | POA: Diagnosis not present

## 2018-12-06 DIAGNOSIS — I350 Nonrheumatic aortic (valve) stenosis: Secondary | ICD-10-CM | POA: Insufficient documentation

## 2018-12-06 DIAGNOSIS — Z7901 Long term (current) use of anticoagulants: Secondary | ICD-10-CM | POA: Diagnosis not present

## 2018-12-06 DIAGNOSIS — J449 Chronic obstructive pulmonary disease, unspecified: Secondary | ICD-10-CM | POA: Diagnosis not present

## 2018-12-06 DIAGNOSIS — I110 Hypertensive heart disease with heart failure: Secondary | ICD-10-CM | POA: Insufficient documentation

## 2018-12-06 DIAGNOSIS — Z86711 Personal history of pulmonary embolism: Secondary | ICD-10-CM | POA: Insufficient documentation

## 2018-12-06 DIAGNOSIS — E785 Hyperlipidemia, unspecified: Secondary | ICD-10-CM | POA: Diagnosis not present

## 2018-12-06 DIAGNOSIS — Z955 Presence of coronary angioplasty implant and graft: Secondary | ICD-10-CM | POA: Diagnosis not present

## 2018-12-06 DIAGNOSIS — E039 Hypothyroidism, unspecified: Secondary | ICD-10-CM | POA: Diagnosis not present

## 2018-12-06 DIAGNOSIS — Z79899 Other long term (current) drug therapy: Secondary | ICD-10-CM | POA: Diagnosis not present

## 2018-12-06 DIAGNOSIS — Z87891 Personal history of nicotine dependence: Secondary | ICD-10-CM | POA: Insufficient documentation

## 2018-12-06 DIAGNOSIS — I2510 Atherosclerotic heart disease of native coronary artery without angina pectoris: Secondary | ICD-10-CM | POA: Diagnosis not present

## 2018-12-06 LAB — BASIC METABOLIC PANEL
Anion gap: 9 (ref 5–15)
BUN: 19 mg/dL (ref 8–23)
CO2: 30 mmol/L (ref 22–32)
CREATININE: 1.01 mg/dL (ref 0.61–1.24)
Calcium: 9.4 mg/dL (ref 8.9–10.3)
Chloride: 102 mmol/L (ref 98–111)
GFR calc Af Amer: >60 mL/min (ref >60)
GFR calc non Af Amer: >60 mL/min (ref >60)
Glucose, Bld: 89 mg/dL (ref 70–99)
Potassium: 4.2 mmol/L (ref 3.5–5.1)
Sodium: 141 mmol/L (ref 135–145)

## 2018-12-06 LAB — LIPID PANEL
Cholesterol: 147 mg/dL (ref 0–200)
HDL: 49 mg/dL (ref >40)
LDL CALC: 81 mg/dL (ref 0–99)
TRIGLYCERIDES: 84 mg/dL (ref <150)
Total CHOL/HDL Ratio: 3 RATIO
VLDL: 17 mg/dL (ref 0–40)

## 2018-12-06 LAB — TSH: TSH: 1.21 u[IU]/mL (ref 0.350–4.500)

## 2018-12-06 MED ORDER — LOSARTAN POTASSIUM 25 MG PO TABS: 25.0000 mg | ORAL_TABLET | Freq: Every day | ORAL | 11 refills | Status: DC

## 2018-12-06 NOTE — Progress Notes (Signed)
Advanced Heart Failure Clinic Note  PCP: Dr. Woody Seller EP: Dr. Lovena Le  HF Cardiology: Dr. Aundra Dubin  78 y.o. referred by Dr. Lovena Le for evaluation of CHF. Patient has a long cardiac history.  He had a prior PCI to the RCA with BMS.  Most recent cath in 2/17 showed nonobstructive CAD, patent RCA stent.  In 2009, echo showed EF 20-25%.  He went on to have ICD placed and later, CRT-P when he developed complete heart block requiring CRT upgrade (no longer has defibrillator). Echo in 1/17 showed EF improved to 55-60%. Echo in 5/19 showed EF 40-45%, moderate LVH, mild-moderate AI, normal RV.    Patient has been seeing pulmonary.  He does not appear to have been very compliant with inhalers.  He was started on Duonebs by Dr. Lenna Gilford but stopped it because it was "messing up my eyes."  He is currently on no regular COPD meds.    Echo in 10/19 showed EF up to 45-50% with mild AS.   Patient returns for followup of CHF and CAD.  Symptoms are stable.  He is ok walking in the house and uses a treadmill.  He is short of breath walking more than about 50 yards.  +Fatigue.  No palpitations, no chest pain. SBP running 130s-140s at home.  No lightheadedness or falls.   Labs (1/19): LDL 80, HDL 42 Labs (4/19): K 3.7, creatinine 1.21, TSH normal Labs (5/19): K 4.3, creatinine 0.97 Labs (9/19): K 3.7, creatinine 1.26 Labs (10/19): K 4.1, creatinine 1.03, hgb 14.7, TSH 7.4, free T4 normal.  Labs (11/19): K 4, creatinine 1.06  ECG: AV sequential pacing  PMH: 1. Aortic insufficiency: Mild to moderate on most recent echo in 1/17.  2. HTN 3. B12 deficiency 4. Atrial tachycardia: S/p ablation in 1/19.  5. Hypothyroidism 6. Hyperlipidemia 7. Prior PE: Not currently on anticoagulation.  8. H/o VT: had CRT-D device, CRT-P since most recent generator change. .  9. CAD: h/o BMS to RCA.  - LHC (2/17) with mild nonobstructive CAD, patent RCA stent.  10. Complete heart block: Has Medtronic CRT-P device.   11. Chronic  systolic CHF: Probably primarily nonischemic CMP as he does not have extensive enough CAD to explain CMP.  - Echo (2009): EF 20-25%.  - Echo (1/17): EF 55-60%, mild LV dilation, mild-moderate AI, normal RV size and systolic function.  - RHC (2/17): mean RA 10, PA 44/10, mean PCWP 12, CI 2.18.  - Echo (5/19): EF 40-45%, moderate LVH, mild-moderate AI, normal RV size and systolic function.  - Gynecomastia with spironolactone.  - Echo (10/19): EF 45-50%, mild AS.  12. COPD: Severe emphysema on prior chest CT.  Smoked in past.   Review of systems complete and found to be negative unless listed in HPI.   Social History   Socioeconomic History  . Marital status: Married    Spouse name: Not on file  . Number of children: 2  . Years of education: 10  . Highest education level: Not on file  Occupational History  . Occupation: Development worker, community - self employed    Employer: RETIRED  Social Needs  . Financial resource strain: Not hard at all  . Food insecurity:    Worry: Never true    Inability: Never true  . Transportation needs:    Medical: No    Non-medical: No  Tobacco Use  . Smoking status: Former Smoker    Packs/day: 1.00    Years: 43.00    Pack years: 43.00  Types: Cigarettes    Start date: 10/17/1957    Last attempt to quit: 12/13/2000    Years since quitting: 17.9  . Smokeless tobacco: Never Used  . Tobacco comment: started at age 22  Substance and Sexual Activity  . Alcohol use: No    Alcohol/week: 0.0 standard drinks  . Drug use: No  . Sexual activity: Not Currently  Lifestyle  . Physical activity:    Days per week: Not on file    Minutes per session: Not on file  . Stress: Not on file  Relationships  . Social connections:    Talks on phone: Not on file    Gets together: Not on file    Attends religious service: Not on file    Active member of club or organization: Not on file    Attends meetings of clubs or organizations: Not on file    Relationship status: Not on file    . Intimate partner violence:    Fear of current or ex partner: Not on file    Emotionally abused: Not on file    Physically abused: Not on file    Forced sexual activity: Not on file  Other Topics Concern  . Not on file  Social History Narrative   Self employed Development worker, community; 2 daughters.    Family History  Problem Relation Age of Onset  . Diabetes type I Sister        daughter   . Lung cancer Brother        several types of cancer  . Lung cancer Brother        several types of cancer  . Lung cancer Brother   . Clotting disorder Neg Hx        also no repiratory disease   . Prostate cancer Neg Hx    Review of systems complete and found to be negative unless listed in HPI.   Current Outpatient Medications  Medication Sig Dispense Refill  . aspirin EC 81 MG tablet Take 81 mg by mouth at bedtime.    . bisoprolol (ZEBETA) 5 MG tablet Take 0.5 tablet (2.5 mg total) by mouth once a day. 30 tablet 3  . Calcium Carb-Cholecalciferol (CALCIUM 500 + D3 PO) Take by mouth.    . Cyanocobalamin (VITAMIN B-12 IJ) Inject as directed every 30 (thirty) days.    Marland Kitchen eplerenone (INSPRA) 25 MG tablet Take 1 tablet (25 mg total) by mouth daily. Take at bed time 45 tablet 3  . FLUoxetine (PROZAC) 10 MG tablet Take 10 mg by mouth at bedtime.    . fluticasone (FLONASE) 50 MCG/ACT nasal spray Place 1 spray into both nostrils daily as needed.    . furosemide (LASIX) 20 MG tablet Take 3 tablets (60 mg total) by mouth every morning. 120 tablet 2  . levothyroxine (SYNTHROID, LEVOTHROID) 112 MCG tablet Take 1 tablet (112 mcg total) by mouth daily before breakfast. (Patient taking differently: Take 125 mcg by mouth daily before breakfast. ) 90 tablet 2  . losartan (COZAAR) 25 MG tablet Take 1 tablet (25 mg total) by mouth at bedtime. 30 tablet 11  . memantine (NAMENDA) 10 MG tablet TAKE 1 TABLET BY MOUTH TWICE A DAY 180 tablet 4  . Polyethyl Glycol-Propyl Glycol (LUBRICANT EYE DROPS) 0.4-0.3 % SOLN Place 1-2 drops  into both eyes 2 (two) times daily as needed (for dry/irritated eyes.).    Marland Kitchen potassium chloride SA (K-DUR,KLOR-CON) 20 MEQ tablet Take 1 tablet (20 mEq total) by mouth daily.  90 tablet 3  . simvastatin (ZOCOR) 20 MG tablet Take 1 tablet (20 mg total) by mouth at bedtime. 90 tablet 3   No current facility-administered medications for this encounter.    BP (!) 150/78   Pulse 70   Wt 60.9 kg (134 lb 3.2 oz)   SpO2 90%   BMI 19.82 kg/m   Wt Readings from Last 3 Encounters:  12/06/18 60.9 kg (134 lb 3.2 oz)  09/26/18 60.3 kg (132 lb 15 oz)  09/04/18 59.2 kg (130 lb 9.6 oz)   General: NAD, thin Neck: No JVD, no thyromegaly or thyroid nodule.  Lungs: DIstant breath sounds.   CV: Nondisplaced PMI.  Heart regular S1/S2, no S3/S4, no murmur.  No peripheral edema.  No carotid bruit.  Normal pedal pulses.  Abdomen: Soft, nontender, no hepatosplenomegaly, no distention.  Skin: Intact without lesions or rashes.  Neurologic: Alert and oriented x 3.  Psych: Normal affect. Extremities: No clubbing or cyanosis.  HEENT: Normal.    Assessment/Plan: 1. Chronic systolic CHF: Initially, EF low in 20-25% range in 2009.  However, over time, EF improved to 55-60% in 1/17.  Echo in 5/19 with EF 40-45%.  Echo in 10/19 with EF up to 45-50%.  Suspect primarily nonischemic cardiomyopathy as degree of coronary disease does not explain his initial low EF.  Patient has a Medtronic CRT-P device.  NYHA class III symptoms.  Symptoms are due mainly to his COPD.  He is not volume overloaded by exam today. No episodes of lightheadedness.   - Continue bisoprolol 2.5 mg daily.  - He will increase losartan to 25 mg qhs.  BMET today and in 10 days.    - Continue Lasix 60 mg daily - Continue eplerenone 25 mg daily.   2. COPD: His COPD appears quite severe.  He is now on Symbicort.  3. CAD: Cath in 2017 with nonobstructive disease, patent RCA stent. No chest pain.  - Continue statin, ASA 81.  4. Aortic stenosis: Mild on  last echo.  5. H/o atrial tachycardia: S/p ablation by Dr. Lovena Le, no palpitations.  6. Complete heart block: He has MDT CRT-P device.  7. Pulmonary hypertension: Noted on 2017 RHC, mild and likely group 3 from COPD. No change.  8. VT: Continue bisoprolol.   Followup in 4 months.   Loralie Champagne 12/06/2018

## 2018-12-06 NOTE — Patient Instructions (Signed)
EKG was completed.  Labs were done today. We will call you with any ABNORMAL results. No news is good news!  INCREASE Losartan to 25 mg (1 tab) every night.  Your physician recommends that you schedule a follow-up appointment in: 4 months.

## 2018-12-07 ENCOUNTER — Telehealth (HOSPITAL_COMMUNITY): Payer: Self-pay

## 2018-12-07 ENCOUNTER — Encounter (HOSPITAL_COMMUNITY): Payer: Self-pay

## 2018-12-07 MED ORDER — ROSUVASTATIN CALCIUM 10 MG PO TABS: 10.0000 mg | ORAL_TABLET | Freq: Every day | ORAL | 6 refills | Status: DC

## 2018-12-07 NOTE — Telephone Encounter (Signed)
-----   Message from Larey Dresser, MD sent at 12/06/2018  3:01 PM EST ----- Would like to see LDL lower.  Stop simvastatin, start Crestor 10 mg daily.  Lipids/LFTs in 2 months.

## 2018-12-07 NOTE — Telephone Encounter (Signed)
D/c'd simvastatin and sent crestor script to pharm per DM order

## 2018-12-07 NOTE — Telephone Encounter (Signed)
Relayed message to wife, verbalized understanding and agreed to medication change. Made appt for f/u labs 02/05/2019 @ 1030

## 2018-12-11 ENCOUNTER — Encounter (HOSPITAL_COMMUNITY): Payer: Medicare Other

## 2018-12-12 ENCOUNTER — Telehealth (HOSPITAL_COMMUNITY): Payer: Self-pay

## 2018-12-12 NOTE — Telephone Encounter (Signed)
Daughter Vaughan Basta called to ask about new medicine Crestor.  She notes that patient was having muscle cramping with Simvastatin and asked if that will continue with Crestor. Advised that cramping should not worsen with low dose Crestor and if It does to call office. Advised that patient can take CoQ10 if muscle cramps worsens. States understanding.

## 2018-12-13 ENCOUNTER — Encounter (HOSPITAL_COMMUNITY): Payer: Medicare Other

## 2018-12-17 ENCOUNTER — Emergency Department (HOSPITAL_COMMUNITY): Payer: Medicare Other

## 2018-12-17 ENCOUNTER — Encounter (HOSPITAL_COMMUNITY): Payer: Self-pay | Admitting: Emergency Medicine

## 2018-12-17 ENCOUNTER — Telehealth (HOSPITAL_COMMUNITY): Payer: Self-pay

## 2018-12-17 ENCOUNTER — Other Ambulatory Visit: Payer: Self-pay

## 2018-12-17 ENCOUNTER — Emergency Department (HOSPITAL_COMMUNITY)
Admission: EM | Admit: 2018-12-17 | Discharge: 2018-12-18 | Disposition: A | Discharge status: Home / Self Care | Payer: Medicare Other | Attending: Emergency Medicine | Admitting: Emergency Medicine

## 2018-12-17 DIAGNOSIS — T18128A Food in esophagus causing other injury, initial encounter: Secondary | ICD-10-CM

## 2018-12-17 DIAGNOSIS — I5022 Chronic systolic (congestive) heart failure: Secondary | ICD-10-CM | POA: Insufficient documentation

## 2018-12-17 DIAGNOSIS — E039 Hypothyroidism, unspecified: Secondary | ICD-10-CM | POA: Insufficient documentation

## 2018-12-17 DIAGNOSIS — R1310 Dysphagia, unspecified: Secondary | ICD-10-CM

## 2018-12-17 DIAGNOSIS — Z85850 Personal history of malignant neoplasm of thyroid: Secondary | ICD-10-CM | POA: Diagnosis not present

## 2018-12-17 DIAGNOSIS — I2510 Atherosclerotic heart disease of native coronary artery without angina pectoris: Secondary | ICD-10-CM | POA: Diagnosis not present

## 2018-12-17 DIAGNOSIS — Z95810 Presence of automatic (implantable) cardiac defibrillator: Secondary | ICD-10-CM | POA: Diagnosis not present

## 2018-12-17 DIAGNOSIS — I110 Hypertensive heart disease with heart failure: Secondary | ICD-10-CM | POA: Diagnosis not present

## 2018-12-17 DIAGNOSIS — Z87891 Personal history of nicotine dependence: Secondary | ICD-10-CM | POA: Insufficient documentation

## 2018-12-17 DIAGNOSIS — Z79899 Other long term (current) drug therapy: Secondary | ICD-10-CM | POA: Insufficient documentation

## 2018-12-17 DIAGNOSIS — Z7982 Long term (current) use of aspirin: Secondary | ICD-10-CM | POA: Diagnosis not present

## 2018-12-17 DIAGNOSIS — K222 Esophageal obstruction: Secondary | ICD-10-CM

## 2018-12-17 DIAGNOSIS — Z955 Presence of coronary angioplasty implant and graft: Secondary | ICD-10-CM | POA: Diagnosis not present

## 2018-12-17 DIAGNOSIS — W44F3XA Food entering into or through a natural orifice, initial encounter: Secondary | ICD-10-CM

## 2018-12-17 NOTE — ED Notes (Signed)
Pt drank water without any problems

## 2018-12-17 NOTE — ED Provider Notes (Signed)
[facility] EMERGENCY DEPARTMENT Provider Note   CSN: [id number] Arrival date & time: 12/17/18  2013    History   Chief Complaint Chief Complaint  Patient presents with  . Swallowed Foreign Body  . Shortness of Breath    HPI [name] is a 78 y.o. male.     Patient presents to the emergency department for evaluation after he choked while eating.  Patient reports that he was eating beef around 7 PM when he felt like it got stuck.  He reports that there was a great deal of pressure in the upper chest and throat when this occurred and then he vomited.  Some of what he was eating to come back up.  He still felt pressure so presented to the ER.  While he was coming to the ER, however, the pressure relieved and he is feeling better now.  No difficulty breathing.  No chest pain.  No difficulty swallowing currently.  He does report that recently he has been having difficulty swallowing his pills and feeling like food gets caught when he eats.     Past Medical History:  Diagnosis Date  . AICD (automatic cardioverter/defibrillator) present   . Aortic insufficiency     moderate aortic ins moderate/asymptomatic/normal LV cavity size.   . Carotid artery disease ([place])     less than 50% stenosis bilaterally  . Carotid artery disease ([place])   . Chronic systolic CHF (congestive heart failure) ([place])   . Coronary artery disease   . Drug-induced gynecomastia     secondary to spironolactone  . Dyslipidemia   . Hypertension   . ICD (implantable cardiac defibrillator), biventricular, in situ     Medtronic model  [id number]  . Ischemic cardiomyopathy     status post non-ST elevation microinfarction stent to the right coronary artery is a   . LV dysfunction     NYHA class I/prior ejection fraction 20-25% and an ejection fraction 7 2009 50-55%, ejection fraction 60% bedside echocardiogram July 2012   . PONV (postoperative nausea and vomiting)   . Pulmonary embolism (HCC)    Prior history of  pulmonary embolism off Coumadin.  . Ventricular tachycardia ([place])    Inducible ventricular polymorphic tachycardia status post ICD followed by upgrade with CRT-D 2007, battery change at 01/31/2011    Patient Active Problem List   Diagnosis Date Noted  . Special screening for malignant neoplasms, colon 08/17/2018  . Memory loss 07/02/2018  . History of pneumonia 02/21/2018  . History of thyroid cancer 01/10/2018  . Postsurgical hypothyroidism 01/10/2018  . Pulmonary embolism ([place])   . PONV (postoperative nausea and vomiting)   . AICD (automatic cardioverter/defibrillator) present   . Hx pulmonary embolism 01/02/2017  . Status post biventricular pacemaker 01/02/2017  . Sepsis ([place]) 10/08/2016  . Pulmonary hypertension ([place]) 12/14/2015  . Coronary artery disease involving native coronary artery without angina pectoris 12/14/2015  . Laryngopharyngeal reflux (LPR) 12/14/2015  . Abnormal nuclear stress test 12/10/2015  . Dyspnea 12/10/2015  . Atrial tachycardia ([place]) 10/06/2015  . Postsurgical percutaneous transluminal coronary angioplasty (PTCA) status 09/14/2012  . Mild carotid artery disease ([place]) 09/14/2012  . Polymorphic ventricular tachycardia ([place]) 09/14/2012  . Chronic systolic heart failure ([place]) 05/22/2012  . Ischemic cardiomyopathy   . LV dysfunction   . Ventricular tachycardia ([place])   . Drug-induced gynecomastia   . Carotid artery disease ([place])   . Aortic insufficiency   . Dyslipidemia   . Biventricular implantable cardioverter-defibrillator in situ   . DYSLIPIDEMIA  08/23/2010  . CAROTID BRUIT 08/23/2010  . Centrilobular emphysema ([place]) 04/10/2009  . NEPHROLITHIASIS 04/10/2009  . RENAL CALCULUS, HX OF 04/10/2009    Past Surgical History:  Procedure Laterality Date  . ABLATION OF DYSRHYTHMIC FOCUS  10/20/2017   atrial tach  . ATRIAL TACH ABLATION N/A 10/20/2017   Procedure: ATRIAL TACH ABLATION;  Surgeon: Evans Lance, MD;  Location: [place] [facility];  Service:  Cardiovascular;  Laterality: N/A;  . CARDIAC CATHETERIZATION    . CARDIAC CATHETERIZATION N/A 12/10/2015   Procedure: Right/Left Heart Cath and Coronary Angiography;  Surgeon: Peter M Martinique, MD;  [facility];  Service: Cardiovascular;  Laterality: N/A;  . CARDIAC DEFIBRILLATOR PLACEMENT     medtronic, remote - yes  . CATARACT EXTRACTION W/PHACO Left 08/25/2014   Procedure: CATARACT EXTRACTION PHACO AND INTRAOCULAR LENS PLACEMENT (IOC);  Surgeon: Tonny Branch, MD;  Location: [facility];  Service: Ophthalmology;  Laterality: Left;  CDE 5.79  . CATARACT EXTRACTION W/PHACO Right 09/18/2014   Procedure: CATARACT EXTRACTION PHACO AND INTRAOCULAR LENS PLACEMENT RIGHT EYE;  Surgeon: Tonny Branch, MD;  Location: [facility];  Service: Ophthalmology;  Laterality: Right;  CDE:3.35  . CORONARY STENT PLACEMENT    . EP IMPLANTABLE DEVICE N/A 05/03/2016   Procedure: BIV PPM Generator Changeout;  Surgeon: Evans Lance, MD;  Location: [place] [facility];  Service: Cardiovascular;  Laterality: N/A;  . INGUINAL HERNIA REPAIR    . left breast biopsy    . RETROPUBIC PROSTATECTOMY    . THORACOTOMY     left  . THYROID SURGERY    . THYROIDECTOMY          Home Medications    Prior to Admission medications   Medication Sig Start Date End Date Taking? Authorizing Provider  albuterol (PROVENTIL) (2.5 MG/3ML) 0.083% nebulizer solution Take 2.5 mg by nebulization daily.    [provider]  aspirin EC 81 MG tablet Take 81 mg by mouth at bedtime.    [provider]  bisoprolol (ZEBETA) 5 MG tablet Take 0.5 tablet (2.5 mg total) by mouth once a day. Patient taking differently: Take 2.5 mg by mouth daily. Take 0.5 tablet (2.5 mg total) by mouth once a day. 08/01/18   Georgiana Shore, NP  Calcium Carb-Cholecalciferol (CALCIUM 500 + D3 PO) Take 1 tablet by mouth at bedtime.     [provider]  Cyanocobalamin (VITAMIN B-12 IJ) Inject 1,000 mcg as directed every 30 (thirty) days.      [provider]  eplerenone (INSPRA) 25 MG tablet Take 1 tablet (25 mg total) by mouth daily. Take at bed time Patient taking differently: Take 12.5 mg by mouth daily.  08/16/18   Shirley Friar, PA-C  FLUoxetine (PROZAC) 10 MG capsule Take 10 mg by mouth at bedtime. 11/16/18   [provider]  fluticasone (FLONASE) 50 MCG/ACT nasal spray Place 1 spray into both nostrils daily as needed (for sinus issues.).  08/25/18   [provider]  furosemide (LASIX) 20 MG tablet Take 3 tablets (60 mg total) by mouth every morning. 08/01/18   Georgiana Shore, NP  levothyroxine (SYNTHROID, LEVOTHROID) 125 MCG tablet Take 125 mcg by mouth daily before breakfast. 11/22/18   [provider]  losartan (COZAAR) 25 MG tablet Take 1 tablet (25 mg total) by mouth at bedtime. 12/06/18 12/06/19  Larey Dresser, MD  memantine (NAMENDA) 10 MG tablet TAKE 1 TABLET BY MOUTH TWICE A DAY Patient taking differently: Take 10  mg by mouth 2 (two) times daily.  07/30/18   Marcial Pacas, MD  Polyethyl Glycol-Propyl Glycol (LUBRICANT EYE DROPS) 0.4-0.3 % SOLN Place 1-2 drops into both eyes 2 (two) times daily as needed (for dry/irritated eyes.).    [provider]  potassium chloride SA (K-DUR,KLOR-CON) 20 MEQ tablet Take 1 tablet (20 mEq total) by mouth daily. 06/11/18   Larey Dresser, MD  rosuvastatin (CRESTOR) 10 MG tablet Take 1 tablet (10 mg total) by mouth daily. Patient taking differently: Take 10 mg by mouth every evening.  12/07/18   Larey Dresser, MD    Family History Family History  Problem Relation Age of Onset  . Diabetes type I Sister        daughter   . Lung cancer Brother        several types of cancer  . Lung cancer Brother        several types of cancer  . Lung cancer Brother   . Clotting disorder Neg Hx        also no repiratory disease   . Prostate cancer Neg Hx     Social History Social History   Tobacco Use  . Smoking status: Former Smoker     Packs/day: 1.00    Years: 43.00    Pack years: 43.00    Types: Cigarettes    Start date: 10/17/1957    Last attempt to quit: 12/13/2000    Years since quitting: 18.0  . Smokeless tobacco: Never Used  . Tobacco comment: started at age 21  Substance Use Topics  . Alcohol use: No    Alcohol/week: 0.0 standard drinks  . Drug use: No     Allergies   Spironolactone   Review of Systems Review of Systems  HENT: Positive for trouble swallowing.   Respiratory: Positive for chest tightness (resolved). Negative for shortness of breath.   All other systems reviewed and are negative.    Physical Exam Updated Vital Signs BP (!) 152/68   Pulse 60   Temp 97.9 F (36.6 C) (Oral)   Resp 18   Ht 5\' 8"  (1.727 m)   Wt 61.7 kg   SpO2 91%   BMI 20.68 kg/m   Physical Exam Vitals signs and nursing note reviewed.  Constitutional:      General: He is not in acute distress.    Appearance: Normal appearance. He is well-developed.  HENT:     Head: Normocephalic and atraumatic.     Right Ear: Hearing normal.     Left Ear: Hearing normal.     Nose: Nose normal.  Eyes:     Conjunctiva/sclera: Conjunctivae normal.     Pupils: Pupils are equal, round, and reactive to light.  Neck:     Musculoskeletal: Normal range of motion and neck supple.  Cardiovascular:     Rate and Rhythm: Regular rhythm.     Heart sounds: S1 normal and S2 normal. No murmur. No friction rub. No gallop.   Pulmonary:     Effort: Pulmonary effort is normal. No respiratory distress.     Breath sounds: Normal breath sounds.  Chest:     Chest wall: No tenderness.  Abdominal:     General: Bowel sounds are normal.     Palpations: Abdomen is soft.     Tenderness: There is no abdominal tenderness. There is no guarding or rebound. Negative signs include Murphy's sign and McBurney's sign.     Hernia: No hernia is present.  Musculoskeletal: Normal  range of motion.  Skin:    General: Skin is warm and dry.     Findings: No  rash.  Neurological:     Mental Status: He is alert and oriented to person, place, and time.     GCS: GCS eye subscore is 4. GCS verbal subscore is 5. GCS motor subscore is 6.     Cranial Nerves: No cranial nerve deficit.     Sensory: No sensory deficit.     Coordination: Coordination normal.  Psychiatric:        Speech: Speech normal.        Behavior: Behavior normal.        Thought Content: Thought content normal.      ED Treatments / Results  Labs (all labs ordered are listed, but only abnormal results are displayed) Labs Reviewed  BASIC METABOLIC PANEL - Abnormal; Notable for the following components:      Result Value   BUN 24 (*)    All other components within normal limits  CBC  POCT I-STAT TROPONIN I    EKG None   ED ECG REPORT   Date: 12/18/2018  Rate: 70  Rhythm: v-pacing, no further analysis   Old EKG Reviewed: unchanged  I have personally reviewed the EKG tracing and agree with the computerized printout as noted.   Radiology Dg Chest 2 View  Result Date: 12/17/2018 CLINICAL DATA:  Shortness of breath. Choked on beef. EXAM: CHEST - 2 VIEW COMPARISON:  Radiograph 02/13/2018 FINDINGS: Multi lead left-sided pacemaker remains in place. Unchanged heart size and mediastinal contours with aortic tortuosity. Chronic hyperinflation and emphysema. Stable areas of scarring throughout the right lung. No acute airspace disease. No pneumothorax, pulmonary edema, or pleural effusion. Chronic compression deformity in the midthoracic spine. Surgical clips at the thoracic inlet. IMPRESSION: 1. No acute chest findings. 2. Chronic hyperinflation and emphysema. Stable right lung scarring. Emphysema (ICD10-J43.9). Electronically Signed   By: Keith Rake M.D.   On: 12/17/2018 21:30    Procedures Procedures (including critical care time)  Medications Ordered in ED Medications - No data to display   Initial Impression / Assessment and Plan / ED Course  I have reviewed the  triage vital signs and the nursing notes.  Pertinent labs & imaging results that were available during my care of the patient were reviewed by me and considered in my medical decision making (see chart for details).        Patient presents to the emergency department after what sounds like an esophageal food impaction.  Patient was eating beef when he felt like something got caught in his throat.  He had associated upper chest discomfort and tightness.  Patient reports emesis with only partial relief of his symptoms.  On the way to the ER, however, symptoms suddenly spontaneously resolved.  Patient does have a cardiac history.  No evidence of arrhythmia or pacemaker earlier or defibrillator firing on interrogation of AICD.  Are negative.  Blood work unremarkable.  EKG reveals ventricular pacing, unchanged from previous.  X-ray of chest does not show any evidence of pneumonia or other abnormality.  He is now able to drink without difficulty here in the ER.  He is scheduled for colonoscopy later this week.  Will inform Dr. Laural Golden of ER visit, would benefit from EGD as well.  Final Clinical Impressions(s) / ED Diagnoses   Final diagnoses:  Dysphagia, unspecified type  Esophageal obstruction due to food impaction    ED Discharge Orders  None       Orpah Greek, MD 12/18/18 0030

## 2018-12-17 NOTE — Telephone Encounter (Signed)
Pt's daughter called to report that pt has been having elevated bp for the last couple days. On 03/01 bp was 151/84 and 03/02 166/93 with HR of 82. Pt is currently taking losartan 25 mg daily at bedtime. Please advise.

## 2018-12-17 NOTE — Telephone Encounter (Signed)
Pt notified. Verbalizes understanding. Lab appt made. 

## 2018-12-17 NOTE — Telephone Encounter (Signed)
He can increase losartan to 25 mg BID, will need BMET in 2 weeks after increase.     Legrand Como [address]" [place], PA-C 12/17/2018 10:05 AM

## 2018-12-17 NOTE — ED Triage Notes (Signed)
Patient eating dinner around 7 pm, got choked on some beef and vomited and shortness of breath.  Upon arrival patient states chocking feeling had resolved.

## 2018-12-18 ENCOUNTER — Encounter (HOSPITAL_COMMUNITY): Payer: Medicare Other

## 2018-12-18 LAB — POCT I-STAT TROPONIN I: Troponin i, poc: 0 ng/mL (ref 0.00–0.08)

## 2018-12-18 LAB — CBC
HCT: 44.5 % (ref 39.0–52.0)
Hemoglobin: 14.4 g/dL (ref 13.0–17.0)
MCH: 29.6 pg (ref 26.0–34.0)
MCHC: 32.4 g/dL (ref 30.0–36.0)
MCV: 91.4 fL (ref 80.0–100.0)
Platelets: 179 10*3/uL (ref 150–400)
RBC: 4.87 MIL/uL (ref 4.22–5.81)
RDW: 13.5 % (ref 11.5–15.5)
WBC: 7.1 10*3/uL (ref 4.0–10.5)
nRBC: 0 % (ref 0.0–0.2)

## 2018-12-18 LAB — BASIC METABOLIC PANEL
Anion gap: 8 (ref 5–15)
BUN: 24 mg/dL — ABNORMAL HIGH (ref 8–23)
CO2: 28 mmol/L (ref 22–32)
Calcium: 9.4 mg/dL (ref 8.9–10.3)
Chloride: 105 mmol/L (ref 98–111)
Creatinine, Ser: 1 mg/dL (ref 0.61–1.24)
GFR calc Af Amer: >60 mL/min (ref >60)
GFR calc non Af Amer: >60 mL/min (ref >60)
Glucose, Bld: 95 mg/dL (ref 70–99)
Potassium: 4.5 mmol/L (ref 3.5–5.1)
Sodium: 141 mmol/L (ref 135–145)

## 2018-12-18 MED ORDER — PANTOPRAZOLE SODIUM 40 MG PO TBEC: 40.0000 mg | DELAYED_RELEASE_TABLET | Freq: Every day | ORAL | 2 refills | Status: DC

## 2018-12-20 ENCOUNTER — Encounter (HOSPITAL_COMMUNITY): Payer: Medicare Other

## 2018-12-20 ENCOUNTER — Encounter (HOSPITAL_COMMUNITY)
Admission: RE | Disposition: A | Discharge status: Home / Self Care | Payer: Self-pay | Source: Home / Self Care | Attending: Internal Medicine

## 2018-12-20 ENCOUNTER — Encounter (HOSPITAL_COMMUNITY): Payer: Self-pay

## 2018-12-20 ENCOUNTER — Other Ambulatory Visit: Payer: Self-pay

## 2018-12-20 ENCOUNTER — Ambulatory Visit (HOSPITAL_COMMUNITY)
Admission: RE | Admit: 2018-12-20 | Discharge: 2018-12-20 | Disposition: A | Discharge status: Home / Self Care | Payer: Medicare Other | Attending: Internal Medicine | Admitting: Internal Medicine

## 2018-12-20 DIAGNOSIS — I255 Ischemic cardiomyopathy: Secondary | ICD-10-CM | POA: Insufficient documentation

## 2018-12-20 DIAGNOSIS — K5730 Diverticulosis of large intestine without perforation or abscess without bleeding: Secondary | ICD-10-CM | POA: Insufficient documentation

## 2018-12-20 DIAGNOSIS — E785 Hyperlipidemia, unspecified: Secondary | ICD-10-CM | POA: Insufficient documentation

## 2018-12-20 DIAGNOSIS — Z86711 Personal history of pulmonary embolism: Secondary | ICD-10-CM | POA: Diagnosis not present

## 2018-12-20 DIAGNOSIS — K222 Esophageal obstruction: Secondary | ICD-10-CM

## 2018-12-20 DIAGNOSIS — Z1211 Encounter for screening for malignant neoplasm of colon: Secondary | ICD-10-CM | POA: Insufficient documentation

## 2018-12-20 DIAGNOSIS — D120 Benign neoplasm of cecum: Secondary | ICD-10-CM | POA: Diagnosis not present

## 2018-12-20 DIAGNOSIS — E890 Postprocedural hypothyroidism: Secondary | ICD-10-CM | POA: Diagnosis not present

## 2018-12-20 DIAGNOSIS — K449 Diaphragmatic hernia without obstruction or gangrene: Secondary | ICD-10-CM

## 2018-12-20 DIAGNOSIS — D123 Benign neoplasm of transverse colon: Secondary | ICD-10-CM

## 2018-12-20 DIAGNOSIS — Z7951 Long term (current) use of inhaled steroids: Secondary | ICD-10-CM | POA: Insufficient documentation

## 2018-12-20 DIAGNOSIS — I110 Hypertensive heart disease with heart failure: Secondary | ICD-10-CM | POA: Diagnosis not present

## 2018-12-20 DIAGNOSIS — R1314 Dysphagia, pharyngoesophageal phase: Secondary | ICD-10-CM | POA: Diagnosis not present

## 2018-12-20 DIAGNOSIS — I2510 Atherosclerotic heart disease of native coronary artery without angina pectoris: Secondary | ICD-10-CM | POA: Diagnosis not present

## 2018-12-20 DIAGNOSIS — K6289 Other specified diseases of anus and rectum: Secondary | ICD-10-CM

## 2018-12-20 DIAGNOSIS — K644 Residual hemorrhoidal skin tags: Secondary | ICD-10-CM | POA: Insufficient documentation

## 2018-12-20 DIAGNOSIS — Z79899 Other long term (current) drug therapy: Secondary | ICD-10-CM | POA: Diagnosis not present

## 2018-12-20 DIAGNOSIS — Z7982 Long term (current) use of aspirin: Secondary | ICD-10-CM | POA: Diagnosis not present

## 2018-12-20 DIAGNOSIS — I5022 Chronic systolic (congestive) heart failure: Secondary | ICD-10-CM | POA: Diagnosis not present

## 2018-12-20 DIAGNOSIS — D122 Benign neoplasm of ascending colon: Secondary | ICD-10-CM | POA: Insufficient documentation

## 2018-12-20 DIAGNOSIS — Z87891 Personal history of nicotine dependence: Secondary | ICD-10-CM | POA: Diagnosis not present

## 2018-12-20 DIAGNOSIS — Z955 Presence of coronary angioplasty implant and graft: Secondary | ICD-10-CM | POA: Diagnosis not present

## 2018-12-20 DIAGNOSIS — Z95810 Presence of automatic (implantable) cardiac defibrillator: Secondary | ICD-10-CM | POA: Insufficient documentation

## 2018-12-20 DIAGNOSIS — Z79890 Hormone replacement therapy (postmenopausal): Secondary | ICD-10-CM | POA: Insufficient documentation

## 2018-12-20 HISTORY — PX: ESOPHAGOSCOPY WITH DILITATION: SHX5618

## 2018-12-20 HISTORY — PX: COLONOSCOPY: SHX5424

## 2018-12-20 HISTORY — PX: POLYPECTOMY: SHX5525

## 2018-12-20 HISTORY — PX: BIOPSY: SHX5522

## 2018-12-20 SURGERY — COLONOSCOPY
Anesthesia: Moderate Sedation

## 2018-12-20 MED ORDER — LIDOCAINE VISCOUS HCL 2 % MT SOLN
OROMUCOSAL | Status: DC | PRN
  Administered 2018-12-20: 4 mL via OROMUCOSAL

## 2018-12-20 MED ORDER — MEPERIDINE HCL 100 MG/ML IJ SOLN
INTRAMUSCULAR | Status: AC
  Filled 2018-12-20: qty 1

## 2018-12-20 MED ORDER — MIDAZOLAM HCL 5 MG/5ML IJ SOLN
INTRAMUSCULAR | Status: AC
  Filled 2018-12-20: qty 10

## 2018-12-20 MED ORDER — SODIUM CHLORIDE 0.9 % IV SOLN
INTRAVENOUS | Status: DC
  Administered 2018-12-20: 07:00:00 via INTRAVENOUS

## 2018-12-20 MED ORDER — MIDAZOLAM HCL 5 MG/5ML IJ SOLN
INTRAMUSCULAR | Status: DC | PRN
  Administered 2018-12-20 (×3): 1 mg via INTRAVENOUS

## 2018-12-20 MED ORDER — MEPERIDINE HCL 50 MG/ML IJ SOLN
INTRAMUSCULAR | Status: DC | PRN
  Administered 2018-12-20 (×2): 15 mg via INTRAVENOUS

## 2018-12-20 MED ORDER — LIDOCAINE VISCOUS HCL 2 % MT SOLN
OROMUCOSAL | Status: AC
  Filled 2018-12-20: qty 15

## 2018-12-20 MED ORDER — STERILE WATER FOR IRRIGATION IR SOLN
Status: DC | PRN
  Administered 2018-12-20: 08:00:00

## 2018-12-20 NOTE — Op Note (Signed)
[facility] Patient Name: [name] Procedure Date: 12/20/2018 7:33 AM MRN: [id number] Date of Birth: 11-15-1940 Attending MD: Hildred Laser , MD CSN: [id number] Age: 78 Admit Type: Outpatient Procedure:                Colonoscopy Indications:              Screening for colorectal malignant neoplasm Providers:                Hildred Laser, MD, [place]is Peak B. Sharon Seller, RN, Nelma Rothman, Technician Referring MD:             Glenda Chroman, MD Medicines:                Midazolam 1 mg IV, Meperidine 15 mg IV Complications:            No immediate complications. Estimated Blood Loss:     Estimated blood loss was minimal. Procedure:                Pre-Anesthesia Assessment:                           - Prior to the procedure, a History and Physical                            was performed, and patient medications and                            allergies were reviewed. The patient's tolerance of                            previous anesthesia was also reviewed. The risks                            and benefits of the procedure and the sedation                            options and risks were discussed with the patient.                            All questions were answered, and informed consent                            was obtained. Prior Anticoagulants: The patient                            last took aspirin 1 day prior to the procedure. ASA                            Grade Assessment: III - A patient with severe                            systemic disease. After reviewing the risks and  benefits, the patient was deemed in satisfactory                            condition to undergo the procedure.                           After obtaining informed consent, the colonoscope                            was passed under direct vision. Throughout the                            procedure, the patient's blood pressure, pulse, and               oxygen saturations were monitored continuously. The                            [id number]-[id number] ([id number]) was introduced through the                            anus and advanced to the the cecum, identified by                            appendiceal orifice and ileocecal valve. The                            colonoscopy was performed without difficulty. The                            patient tolerated the procedure well. The quality                            of the bowel preparation was good. The ileocecal                            valve, appendiceal orifice, and rectum were                            photographed. Scope In: 7:56:48 AM Scope Out: 8:23:04 AM Scope Withdrawal Time: 0 hours 16 minutes 36 seconds  Total Procedure Duration: 0 hours 26 minutes 16 seconds  Findings:      The perianal and digital rectal examinations were normal.      A localized area of mildly congested and granular mucosa was found in       the ascending colon and in the cecum. Biopsies were taken with a cold       forceps for histology. The pathology specimen was placed into Bottle       Number 2.      Four sessile polyps were found in the ascending colon. The polyps were       small in size. These polyps were removed with a cold snare. Resection       and retrieval were complete. The pathology specimen was placed into       Bottle Number 1.      Five polyps were found in the transverse colon and  hepatic flexure. The       polyps were 4 to 7 mm in size. These polyps were removed with a cold       snare. Resection and retrieval were complete. The pathology specimen was       placed into Bottle Number 1.      Two sessile polyps were found in the transverse colon and hepatic       flexure. The polyps were small in size. These were biopsied with a cold       forceps for histology.      A few small-mouthed diverticula were found in the sigmoid colon.      External hemorrhoids were found during  retroflexion. The hemorrhoids       were small. Impression:               - Congested and granular mucosa in the ascending                            colon and in the cecum. Biopsied.                           - Four small polyps in the ascending colon, removed                            with a cold snare. Resected and retrieved.                           - Five 4 to 7 mm polyps in the transverse colon and                            at the hepatic flexure, removed with a cold snare.                            Resected and retrieved.                           - Two small polyps in the transverse colon and at                            the hepatic flexure. Biopsied.                           - Diverticulosis in the sigmoid colon.                           - External hemorrhoids. Moderate Sedation:      Moderate (conscious) sedation was administered by the endoscopy nurse       and supervised by the endoscopist. The following parameters were       monitored: oxygen saturation, heart rate, blood pressure, CO2       capnography and response to care. Total physician intraservice time was       31 minutes. Recommendation:           - Patient has a contact number available for  emergencies. The signs and symptoms of potential                            delayed complications were discussed with the                            patient. Return to normal activities tomorrow.                            Written discharge instructions were provided to the                            patient.                           - Resume previous diet today.                           - Continue present medications.                           - No aspirin, ibuprofen, naproxen, or other                            non-steroidal anti-inflammatory drugs for 3 days.                           - Await pathology results.                           - Repeat colonoscopy is recommended. The                             colonoscopy date will be determined after pathology                            results from today's exam become available for                            review. Procedure Code(s):        --- Professional ---                           [phone], Colonoscopy, flexible; with removal of                            tumor(s), polyp(s), or other lesion(s) by snare                            technique                           [id number], 59, Colonoscopy, flexible; with biopsy,                            single or multiple  [id number], Moderate sedation; each additional 15                            minutes intraservice time                           G0500, Moderate sedation services provided by the                            same physician or other qualified health care                            professional performing a gastrointestinal                            endoscopic service that sedation supports,                            requiring the presence of an independent trained                            observer to assist in the monitoring of the                            patient's level of consciousness and physiological                            status; initial 15 minutes of intra-service time;                            patient age 63 years or older (additional time may                            be reported with [phone], as appropriate) Diagnosis Code(s):        --- Professional ---                           Z12.11, Encounter for screening for malignant                            neoplasm of colon                           K63.89, Other specified diseases of intestine                           D12.2, Benign neoplasm of ascending colon                           D12.3, Benign neoplasm of transverse colon (hepatic                            flexure or splenic flexure)                           K64.4,  Residual hemorrhoidal skin tags                           K57.30,  Diverticulosis of large intestine without                            perforation or abscess without bleeding CPT copyright 2018 [employer]. All rights reserved. The codes documented in this report are preliminary and upon coder review may  be revised to meet current compliance requirements. Hildred Laser, MD Hildred Laser, MD 12/20/2018 8:35:33 AM This report has been signed electronically. Number of Addenda: 0

## 2018-12-20 NOTE — Discharge Instructions (Signed)
No aspirin or NSAIDs for 3 days. Resume other medications as before. Resume usual diet. No driving for 24 hours. Physician will call with biopsy results.   Colonoscopy, Adult, Care After This sheet gives you information about how to care for yourself after your procedure. Your doctor may also give you more specific instructions. If you have problems or questions, call your doctor. What can I expect after the procedure? After the procedure, it is common to have:  A small amount of blood in your poop for 24 hours.  Some gas.  Mild cramping or bloating in your belly. Follow these instructions at home: General instructions  For the first 24 hours after the procedure: ? Do not drive or use machinery. ? Do not sign important documents. ? Do not drink alcohol. ? Do your daily activities more slowly than normal. ? Eat foods that are soft and easy to digest.  Take over-the-counter or prescription medicines only as told by your doctor. To help cramping and bloating:   Try walking around.  Put heat on your belly (abdomen) as told by your doctor. Use a heat source that your doctor recommends, such as a moist heat pack or a heating pad. ? Put a towel between your skin and the heat source. ? Leave the heat on for 20-30 minutes. ? Remove the heat if your skin turns bright red. This is especially important if you cannot feel pain, heat, or cold. You can get burned. Eating and drinking   Drink enough fluid to keep your pee (urine) clear or pale yellow.  Return to your normal diet as told by your doctor. Avoid heavy or fried foods that are hard to digest.  Avoid drinking alcohol for as long as told by your doctor. Contact a doctor if:  You have blood in your poop (stool) 2-3 days after the procedure. Get help right away if:  You have more than a small amount of blood in your poop.  You see large clumps of tissue (blood clots) in your poop.  Your belly is swollen.  You feel sick  to your stomach (nauseous).  You throw up (vomit).  You have a fever.  You have belly pain that gets worse, and medicine does not help your pain. Summary  After the procedure, it is common to have a small amount of blood in your poop. You may also have mild cramping and bloating in your belly.  For the first 24 hours after the procedure, do not drive or use machinery, do not sign important documents, and do not drink alcohol.  Get help right away if you have a lot of blood in your poop, feel sick to your stomach, have a fever, or have more belly pain. This information is not intended to replace advice given to you by your health care provider. Make sure you discuss any questions you have with your health care provider. Document Released: 11/05/2010 Document Revised: 08/03/2017 Document Reviewed: 06/27/2016 Elsevier Interactive Patient Education  2019 [place] Endoscopy, Adult, Care After This sheet gives you information about how to care for yourself after your procedure. Your health care provider may also give you more specific instructions. If you have problems or questions, contact your health care provider. What can I expect after the procedure? After the procedure, it is common to have:  A sore throat.  Mild stomach pain or discomfort.  Bloating.  Nausea. Follow these instructions at home:   Follow instructions from your health care  provider about what to eat or drink after your procedure.  Return to your normal activities as told by your health care provider. Ask your health care provider what activities are safe for you.  Take over-the-counter and prescription medicines only as told by your health care provider.  Do not drive for 24 hours if you were given a sedative during your procedure.  Keep all follow-up visits as told by your health care provider. This is important. Contact a health care provider if you have:  A sore throat that lasts longer than  one day.  Trouble swallowing. Get help right away if:  You vomit blood or your vomit looks like coffee grounds.  You have: ? A fever. ? Bloody, black, or tarry stools. ? A severe sore throat or you cannot swallow. ? Difficulty breathing. ? Severe pain in your chest or abdomen. Summary  After the procedure, it is common to have a sore throat, mild stomach discomfort, bloating, and nausea.  Do not drive for 24 hours if you were given a sedative during the procedure.  Follow instructions from your health care provider about what to eat or drink after your procedure.  Return to your normal activities as told by your health care provider. This information is not intended to replace advice given to you by your health care provider. Make sure you discuss any questions you have with your health care provider. Document Released: 04/03/2012 Document Revised: 03/05/2018 Document Reviewed: 03/05/2018 Elsevier Interactive Patient Education  2019 [employer]  Hiatal Hernia  A hiatal hernia occurs when part of the stomach slides above the muscle that separates the abdomen from the chest (diaphragm). A person can be born with a hiatal hernia (congenital), or it may develop over time. In almost all cases of hiatal hernia, only the top part of the stomach pushes through the diaphragm. Many people have a hiatal hernia with no symptoms. The larger the hernia, the more likely it is that you will have symptoms. In some cases, a hiatal hernia allows stomach acid to flow back into the tube that carries food from your mouth to your stomach (esophagus). This may cause heartburn symptoms. Severe heartburn symptoms may mean that you have developed a condition called gastroesophageal reflux disease (GERD). What are the causes? This condition is caused by a weakness in the opening (hiatus) where the esophagus passes through the diaphragm to attach to the upper part of the stomach. A person may be born with a  weakness in the hiatus, or a weakness can develop over time. What increases the risk? This condition is more likely to develop in:  Older people. Age is a major risk factor for a hiatal hernia, especially if you are over the age of 39.  Pregnant women.  People who are overweight.  People who have frequent constipation. What are the signs or symptoms? Symptoms of this condition usually develop in the form of GERD symptoms. Symptoms include:  Heartburn.  Belching.  Indigestion.  Trouble swallowing.  Coughing or wheezing.  Sore throat.  Hoarseness.  Chest pain.  Nausea and vomiting. How is this diagnosed? This condition may be diagnosed during testing for GERD. Tests that may be done include:  X-rays of your stomach or chest.  An upper gastrointestinal (GI) series. This is an X-ray exam of your GI tract that is taken after you swallow a chalky liquid that shows up clearly on the X-ray.  Endoscopy. This is a procedure to look into your stomach using  a thin, flexible tube that has a tiny camera and light on the end of it. How is this treated? This condition may be treated by:  Dietary and lifestyle changes to help reduce GERD symptoms.  Medicines. These may include: ? Over-the-counter antacids. ? Medicines that make your stomach empty more quickly. ? Medicines that block the production of stomach acid (H2 blockers). ? Stronger medicines to reduce stomach acid (proton pump inhibitors).  Surgery to repair the hernia, if other treatments are not helping. If you have no symptoms, you may not need treatment. Follow these instructions at home: Lifestyle and activity  Do not use any products that contain nicotine or tobacco, such as cigarettes and e-cigarettes. If you need help quitting, ask your health care provider.  Try to achieve and maintain a healthy body weight.  Avoid putting pressure on your abdomen. Anything that puts pressure on your abdomen increases the  amount of acid that may be pushed up into your esophagus. ? Avoid bending over, especially after eating. ? Raise the head of your bed by putting blocks under the legs. This keeps your head and esophagus higher than your stomach. ? Do not wear tight clothing around your chest or stomach. ? Try not to strain when having a bowel movement, when urinating, or when lifting heavy objects. Eating and drinking  Avoid foods that can worsen GERD symptoms. These may include: ? Fatty foods, like fried foods. ? Citrus fruits, like oranges or lemon. ? Other foods and drinks that contain acid, like orange juice or tomatoes. ? Spicy food. ? Chocolate.  Eat frequent small meals instead of three large meals a day. This helps prevent your stomach from getting too full. ? Eat slowly. ? Do not lie down right after eating. ? Do not eat 1-2 hours before bed.  Do not drink beverages with caffeine. These include cola, coffee, cocoa, and tea.  Do not drink alcohol. General instructions  Take over-the-counter and prescription medicines only as told by your health care provider.  Keep all follow-up visits as told by your health care provider. This is important. Contact a health care provider if:  Your symptoms are not controlled with medicines or lifestyle changes.  You are having trouble swallowing.  You have coughing or wheezing that will not go away. Get help right away if:  Your pain is getting worse.  Your pain spreads to your arms, neck, jaw, teeth, or back.  You have shortness of breath.  You sweat for no reason.  You feel sick to your stomach (nauseous) or you vomit.  You vomit blood.  You have bright red blood in your stools.  You have black, tarry stools. This information is not intended to replace advice given to you by your health care provider. Make sure you discuss any questions you have with your health care provider. Document Released: 12/24/2003 Document Revised: 05/08/2017  Document Reviewed: 05/08/2017 Elsevier Interactive Patient Education  2019 [employer]  Hemorrhoids Hemorrhoids are swollen veins in and around the rectum or anus. There are two types of hemorrhoids:  Internal hemorrhoids. These occur in the veins that are just inside the rectum. They may poke through to the outside and become irritated and painful.  External hemorrhoids. These occur in the veins that are outside the anus and can be felt as a painful swelling or hard lump near the anus. Most hemorrhoids do not cause serious problems, and they can be managed with home treatments such as diet and lifestyle changes.  If home treatments do not help the symptoms, procedures can be done to shrink or remove the hemorrhoids. What are the causes? This condition is caused by increased pressure in the anal area. This pressure may result from various things, including:  Constipation.  Straining to have a bowel movement.  Diarrhea.  Pregnancy.  Obesity.  Sitting for long periods of time.  Heavy lifting or other activity that causes you to strain.  Anal sex.  Riding a bike for a long period of time. What are the signs or symptoms? Symptoms of this condition include:  Pain.  Anal itching or irritation.  Rectal bleeding.  Leakage of stool (feces).  Anal swelling.  One or more lumps around the anus. How is this diagnosed? This condition can often be diagnosed through a visual exam. Other exams or tests may also be done, such as:  An exam that involves feeling the rectal area with a gloved hand (digital rectal exam).  An exam of the anal canal that is done using a small tube (anoscope).  A blood test, if you have lost a significant amount of blood.  A test to look inside the colon using a flexible tube with a camera on the end (sigmoidoscopy or colonoscopy). How is this treated? This condition can usually be treated at home. However, various procedures may be done if dietary  changes, lifestyle changes, and other home treatments do not help your symptoms. These procedures can help make the hemorrhoids smaller or remove them completely. Some of these procedures involve surgery, and others do not. Common procedures include:  Rubber band ligation. Rubber bands are placed at the base of the hemorrhoids to cut off their blood supply.  Sclerotherapy. Medicine is injected into the hemorrhoids to shrink them.  Infrared coagulation. A type of light energy is used to get rid of the hemorrhoids.  Hemorrhoidectomy surgery. The hemorrhoids are surgically removed, and the veins that supply them are tied off.  Stapled hemorrhoidopexy surgery. The surgeon staples the base of the hemorrhoid to the rectal wall. Follow these instructions at home: Eating and drinking   Eat foods that have a lot of fiber in them, such as whole grains, beans, nuts, fruits, and vegetables.  Ask your health care provider about taking products that have added fiber (fiber supplements).  Reduce the amount of fat in your diet. You can do this by eating low-fat dairy products, eating less red meat, and avoiding processed foods.  Drink enough fluid to keep your urine pale yellow. Managing pain and swelling   Take warm sitz baths for 20 minutes, 3-4 times a day to ease pain and discomfort. You may do this in a bathtub or using a portable sitz bath that fits over the toilet.  If directed, apply ice to the affected area. Using ice packs between sitz baths may be helpful. ? Put ice in a plastic bag. ? Place a towel between your skin and the bag. ? Leave the ice on for 20 minutes, 2-3 times a day. General instructions  Take over-the-counter and prescription medicines only as told by your health care provider.  Use medicated creams or suppositories as told.  Get regular exercise. Ask your health care provider how much and what kind of exercise is best for you. In general, you should do moderate  exercise for at least 30 minutes on most days of the week (150 minutes each week). This can include activities such as walking, biking, or yoga.  Go to the bathroom  when you have the urge to have a bowel movement. Do not wait.  Avoid straining to have bowel movements.  Keep the anal area dry and clean. Use wet toilet paper or moist towelettes after a bowel movement.  Do not sit on the toilet for long periods of time. This increases blood pooling and pain.  Keep all follow-up visits as told by your health care provider. This is important. Contact a health care provider if you have:  Increasing pain and swelling that are not controlled by treatment or medicine.  Difficulty having a bowel movement, or you are unable to have a bowel movement.  Pain or inflammation outside the area of the hemorrhoids. Get help right away if you have:  Uncontrolled bleeding from your rectum. Summary  Hemorrhoids are swollen veins in and around the rectum or anus.  Most hemorrhoids can be managed with home treatments such as diet and lifestyle changes.  Taking warm sitz baths can help ease pain and discomfort.  In severe cases, procedures or surgery can be done to shrink or remove the hemorrhoids. This information is not intended to replace advice given to you by your health care provider. Make sure you discuss any questions you have with your health care provider. Document Released: 09/30/2000 Document Revised: 02/22/2018 Document Reviewed: 02/22/2018 Elsevier Interactive Patient Education  2019 [employer].  Diverticulosis  Diverticulosis is a condition that develops when small pouches (diverticula) form in the wall of the large intestine (colon). The colon is where water is absorbed and stool is formed. The pouches form when the inside layer of the colon pushes through weak spots in the outer layers of the colon. You may have a few pouches or many of them. What are the causes? The cause of this  condition is not known. What increases the risk? The following factors may make you more likely to develop this condition:  Being older than age 66. Your risk for this condition increases with age. Diverticulosis is rare among people younger than age 29. By age 81, many people have it.  Eating a low-fiber diet.  Having frequent constipation.  Being overweight.  Not getting enough exercise.  Smoking.  Taking over-the-counter pain medicines, like aspirin and ibuprofen.  Having a family history of diverticulosis. What are the signs or symptoms? In most people, there are no symptoms of this condition. If you do have symptoms, they may include:  Bloating.  Cramps in the abdomen.  Constipation or diarrhea.  Pain in the lower left side of the abdomen. How is this diagnosed? This condition is most often diagnosed during an exam for other colon problems. Because diverticulosis usually has no symptoms, it often cannot be diagnosed independently. This condition may be diagnosed by:  Using a flexible scope to examine the colon (colonoscopy).  Taking an X-ray of the colon after dye has been put into the colon (barium enema).  Doing a CT scan. How is this treated? You may not need treatment for this condition if you have never developed an infection related to diverticulosis. If you have had an infection before, treatment may include:  Eating a high-fiber diet. This may include eating more fruits, vegetables, and grains.  Taking a fiber supplement.  Taking a live bacteria supplement (probiotic).  Taking medicine to relax your colon.  Taking antibiotic medicines. Follow these instructions at home:  Drink 6-8 glasses of water or more each day to prevent constipation.  Try not to strain when you have a bowel movement.  If you have had an infection before: ? Eat more fiber as directed by your health care provider or your diet and nutrition specialist (dietitian). ? Take a fiber  supplement or probiotic, if your health care provider approves.  Take over-the-counter and prescription medicines only as told by your health care provider.  If you were prescribed an antibiotic, take it as told by your health care provider. Do not stop taking the antibiotic even if you start to feel better.  Keep all follow-up visits as told by your health care provider. This is important. Contact a health care provider if:  You have pain in your abdomen.  You have bloating.  You have cramps.  You have not had a bowel movement in 3 days. Get help right away if:  Your pain gets worse.  Your bloating becomes very bad.  You have a fever or chills, and your symptoms suddenly get worse.  You vomit.  You have bowel movements that are bloody or black.  You have bleeding from your rectum. Summary  Diverticulosis is a condition that develops when small pouches (diverticula) form in the wall of the large intestine (colon).  You may have a few pouches or many of them.  This condition is most often diagnosed during an exam for other colon problems.  If you have had an infection related to diverticulosis, treatment may include increasing the fiber in your diet, taking supplements, or taking medicines. This information is not intended to replace advice given to you by your health care provider. Make sure you discuss any questions you have with your health care provider. Document Released: 06/30/2004 Document Revised: 08/22/2016 Document Reviewed: 08/22/2016 Elsevier Interactive Patient Education  2019 [employer].  Colon Polyps  Polyps are tissue growths inside the body. Polyps can grow in many places, including the large intestine (colon). A polyp may be a round bump or a mushroom-shaped growth. You could have one polyp or several. Most colon polyps are noncancerous (benign). However, some colon polyps can become cancerous over time. Finding and removing the polyps early can help  prevent this. What are the causes? The exact cause of colon polyps is not known. What increases the risk? You are more likely to develop this condition if you:  Have a family history of colon cancer or colon polyps.  Are older than 61 or older than 45 if you are African American.  Have inflammatory bowel disease, such as ulcerative colitis or Crohn's disease.  Have certain hereditary conditions, such as: ? Familial adenomatous polyposis. ? Lynch syndrome. ? Turcot syndrome. ? Peutz-Jeghers syndrome.  Are overweight.  Smoke cigarettes.  Do not get enough exercise.  Drink too much alcohol.  Eat a diet that is high in fat and red meat and low in fiber.  Had childhood cancer that was treated with abdominal radiation. What are the signs or symptoms? Most polyps do not cause symptoms. If you have symptoms, they may include:  Blood coming from your rectum when having a bowel movement.  Blood in your stool. The stool may look dark red or black.  Abdominal pain.  A change in bowel habits, such as constipation or diarrhea. How is this diagnosed? This condition is diagnosed with a colonoscopy. This is a procedure in which a lighted, flexible scope is inserted into the anus and then passed into the colon to examine the area. Polyps are sometimes found when a colonoscopy is done as part of routine cancer screening tests. How is this treated?  Treatment for this condition involves removing any polyps that are found. Most polyps can be removed during a colonoscopy. Those polyps will then be tested for cancer. Additional treatment may be needed depending on the results of testing. Follow these instructions at home: Lifestyle  Maintain a healthy weight, or lose weight if recommended by your health care provider.  Exercise every day or as told by your health care provider.  Do not use any products that contain nicotine or tobacco, such as cigarettes and e-cigarettes. If you need help  quitting, ask your health care provider.  If you drink alcohol, limit how much you have: ? 0-1 drink a day for women. ? 0-2 drinks a day for men.  Be aware of how much alcohol is in your drink. In the [place] one drink equals one 12 oz bottle of beer (355 mL), one 5 oz glass of wine (148 mL), or one 1 oz shot of hard liquor (44 mL). Eating and drinking   Eat foods that are high in fiber, such as fruits, vegetables, and whole grains.  Eat foods that are high in calcium and vitamin D, such as milk, cheese, yogurt, eggs, liver, fish, and broccoli.  Limit foods that are high in fat, such as fried foods and desserts.  Limit the amount of red meat and processed meat you eat, such as hot dogs, sausage, bacon, and lunch meats. General instructions  Keep all follow-up visits as told by your health care provider. This is important. ? This includes having regularly scheduled colonoscopies. ? Talk to your health care provider about when you need a colonoscopy. Contact a health care provider if:  You have new or worsening bleeding during a bowel movement.  You have new or increased blood in your stool.  You have a change in bowel habits.  You lose weight for no known reason. Summary  Polyps are tissue growths inside the body. Polyps can grow in many places, including the colon.  Most colon polyps are noncancerous (benign), but some can become cancerous over time.  This condition is diagnosed with a colonoscopy.  Treatment for this condition involves removing any polyps that are found. Most polyps can be removed during a colonoscopy. This information is not intended to replace advice given to you by your health care provider. Make sure you discuss any questions you have with your health care provider. Document Released: 06/29/2004 Document Revised: 01/18/2018 Document Reviewed: 01/18/2018 Elsevier Interactive Patient Education  2019 [employer].

## 2018-12-20 NOTE — H&P (Signed)
[name] is an 78 y.o. male.   Chief Complaint: Patient is here for colonoscopy but he would also undergo EGD and ED. HPI: Patient is 78 year old Caucasian male who is here for screening colonoscopy.  He denies abdominal pain change in bowel habits or rectal bleeding.  Last exam was over 10 years ago.  Family history is negative for CRC.  He also complains of intermittent solid food dysphagia.  He also has difficulty swallowing large pills.  He says he has had swallowing difficulty for 2 to 3 years.  He points to midsternal area as site of bolus obstruction.  He has had few episodes of food impaction relieved with regurgitation or less with waiting.  He had one episode while he was eating beef.  He has a history of Schatzki's ring which was last dilated disrupted in 2012.  He denies frequent heartburn nausea vomiting melena or weight loss. Last aspirin dose was yesterday.   Past Medical History:  Diagnosis Date      . Aortic insufficiency     moderate aortic ins moderate/asymptomatic/normal LV cavity size.   . Carotid artery disease ([place])     less than 50% stenosis bilaterally  . Carotid artery disease ([place])   . Chronic systolic CHF (congestive heart failure) ([place])   . Coronary artery disease   . Drug-induced gynecomastia     secondary to spironolactone  . Dyslipidemia   . Hypertension   . ICD (implantable cardiac defibrillator), biventricular, in situ     Medtronic model  [id number]  . Ischemic cardiomyopathy     status post non-ST elevation microinfarction stent to the right coronary artery is a   . LV dysfunction     NYHA class I/prior ejection fraction 20-25% and an ejection fraction 7 2009 50-55%, ejection fraction 60% bedside echocardiogram July 2012   . PONV (postoperative nausea and vomiting)   . Pulmonary embolism ([facility])    Prior history of pulmonary embolism off Coumadin.  . Ventricular tachycardia ([place])    Inducible ventricular polymorphic tachycardia status post ICD  followed by upgrade with CRT-D 2007, battery change at 01/31/2011    Past Surgical History:  Procedure Laterality Date  . ABLATION OF DYSRHYTHMIC FOCUS  10/20/2017   atrial tach  . ATRIAL TACH ABLATION N/A 10/20/2017   Procedure: ATRIAL TACH ABLATION;  Surgeon: Evans Lance, MD;  Location: [facility];  Service: Cardiovascular;  Laterality: N/A;  . CARDIAC CATHETERIZATION    . CARDIAC CATHETERIZATION N/A 12/10/2015   Procedure: Right/Left Heart Cath and Coronary Angiography;  Surgeon: Peter M Martinique, MD;  Location: [facility];  Service: Cardiovascular;  Laterality: N/A;  . CARDIAC DEFIBRILLATOR PLACEMENT     medtronic, remote - yes  . CATARACT EXTRACTION W/PHACO Left 08/25/2014   Procedure: CATARACT EXTRACTION PHACO AND INTRAOCULAR LENS PLACEMENT (IOC);  Surgeon: Tonny Branch, MD;  Location: [facility];  Service: Ophthalmology;  Laterality: Left;  CDE 5.79  . CATARACT EXTRACTION W/PHACO Right 09/18/2014   Procedure: CATARACT EXTRACTION PHACO AND INTRAOCULAR LENS PLACEMENT RIGHT EYE;  Surgeon: Tonny Branch, MD;  Location: [facility];  Service: Ophthalmology;  Laterality: Right;  CDE:3.35  . CORONARY STENT PLACEMENT    . EP IMPLANTABLE DEVICE N/A 05/03/2016   Procedure: BIV PPM Generator Changeout;  Surgeon: Evans Lance, MD;  Location: [facility];  Service: Cardiovascular;  Laterality: N/A;  . INGUINAL HERNIA REPAIR    . left breast biopsy    . RETROPUBIC PROSTATECTOMY    .  THORACOTOMY     left  . THYROID SURGERY    . THYROIDECTOMY      Family History  Problem Relation Age of Onset  . Diabetes type I Sister        daughter   . Lung cancer Brother        several types of cancer  . Lung cancer Brother        several types of cancer  . Lung cancer Brother   . Clotting disorder Neg Hx        also no repiratory disease   . Prostate cancer Neg Hx    Social History:  reports that he quit smoking about 18 years ago. His smoking use included cigarettes. He started smoking  about 61 years ago. He has a 43.00 pack-year smoking history. He has never used smokeless tobacco. He reports that he does not drink alcohol or use drugs.  Allergies:  Allergies  Allergen Reactions  . Spironolactone Other (See Comments)    gynecomastia    Medications Prior to Admission  Medication Sig Dispense Refill  . albuterol (PROVENTIL) (2.5 MG/3ML) 0.083% nebulizer solution Take 2.5 mg by nebulization daily.    Marland Kitchen aspirin EC 81 MG tablet Take 81 mg by mouth at bedtime.    . bisoprolol (ZEBETA) 5 MG tablet Take 0.5 tablet (2.5 mg total) by mouth once a day. (Patient taking differently: Take 2.5 mg by mouth daily. Take 0.5 tablet (2.5 mg total) by mouth once a day.) 30 tablet 3  . Calcium Carb-Cholecalciferol (CALCIUM 500 + D3 PO) Take 1 tablet by mouth at bedtime.     . Cyanocobalamin (VITAMIN B-12 IJ) Inject 1,000 mcg as directed every 30 (thirty) days.     Marland Kitchen eplerenone (INSPRA) 25 MG tablet Take 1 tablet (25 mg total) by mouth daily. Take at bed time (Patient taking differently: Take 12.5 mg by mouth daily. ) 45 tablet 3  . FLUoxetine (PROZAC) 10 MG capsule Take 10 mg by mouth at bedtime.    . fluticasone (FLONASE) 50 MCG/ACT nasal spray Place 1 spray into both nostrils daily as needed (for sinus issues.).     Marland Kitchen furosemide (LASIX) 20 MG tablet Take 3 tablets (60 mg total) by mouth every morning. 120 tablet 2  . levothyroxine (SYNTHROID, LEVOTHROID) 125 MCG tablet Take 125 mcg by mouth daily before breakfast.    . losartan (COZAAR) 25 MG tablet Take 1 tablet (25 mg total) by mouth at bedtime. 30 tablet 11  . memantine (NAMENDA) 10 MG tablet TAKE 1 TABLET BY MOUTH TWICE A DAY (Patient taking differently: Take 10 mg by mouth 2 (two) times daily. ) 180 tablet 4  . pantoprazole (PROTONIX) 40 MG tablet Take 1 tablet (40 mg total) by mouth daily. 90 tablet 2  . Polyethyl Glycol-Propyl Glycol (LUBRICANT EYE DROPS) 0.4-0.3 % SOLN Place 1-2 drops into both eyes 2 (two) times daily as needed (for  dry/irritated eyes.).    Marland Kitchen potassium chloride SA (K-DUR,KLOR-CON) 20 MEQ tablet Take 1 tablet (20 mEq total) by mouth daily. 90 tablet 3  . rosuvastatin (CRESTOR) 10 MG tablet Take 1 tablet (10 mg total) by mouth daily. (Patient taking differently: Take 10 mg by mouth every evening. ) 30 tablet 6    No results found for this or any previous visit (from the past 48 hour(s)). No results found.  ROS  Blood pressure (!) 151/65, pulse 73, temperature 98.7 F (37.1 C), temperature source Oral, resp. rate (!) 22, SpO2 93 %.  Physical Exam  Constitutional:  Well-developed thin Caucasian male in NAD.  HENT:  Mouth/Throat: Oropharynx is clear and moist.  Eyes: Conjunctivae are normal. No scleral icterus.  Neck: No thyromegaly present.  Cardiovascular:  Distant heart sounds.  Normal S1 and S2.  He has faint systolic murmur heard at left lower sternal border.  Respiratory: Effort normal and breath sounds normal.  GI:  Abdomen is flat with lower midline scar.  It is soft and nontender with organomegaly or masses.  Musculoskeletal:        General: No edema.  Neurological: He is alert.  Skin: Skin is warm and dry.     Assessment/Plan Esophageal dysphagia. EGD with ED and average rescreening colonoscopy.  Hildred Laser, MD 12/20/2018, 7:35 AM

## 2018-12-20 NOTE — Op Note (Signed)
[facility] Patient Name: [name] Procedure Date: 12/20/2018 7:35 AM MRN: [id number] Date of Birth: 05/25/1941 Attending MD: Hildred Laser , MD CSN: [id number] Age: 78 Admit Type: Outpatient Procedure:                Upper GI endoscopy Indications:              Esophageal dysphagia Providers:                Hildred Laser, MD, Jeanann Lewandowsky. Sharon Seller, RN, Nelma Rothman, Technician Referring MD:             Glenda Chroman, MD Medicines:                Lidocaine spray, Meperidine 15 mg IV, Midazolam 2                            mg IV Complications:            No immediate complications. Estimated Blood Loss:     Estimated blood loss was minimal. Procedure:                Pre-Anesthesia Assessment:                           - Prior to the procedure, a History and Physical                            was performed, and patient medications and                            allergies were reviewed. The patient's tolerance of                            previous anesthesia was also reviewed. The risks                            and benefits of the procedure and the sedation                            options and risks were discussed with the patient.                            All questions were answered, and informed consent                            was obtained. Prior Anticoagulants: The patient                            last took aspirin 1 day prior to the procedure. ASA                            Grade Assessment: III - A patient with severe  systemic disease. After reviewing the risks and                            benefits, the patient was deemed in satisfactory                            condition to undergo the procedure.                           After obtaining informed consent, the endoscope was                            passed under direct vision. Throughout the                            procedure, the patient's blood pressure,  pulse, and                            oxygen saturations were monitored continuously. The                            GIF-H190 ([id number]) scope was introduced through the                            mouth, and advanced to the second part of duodenum.                            The upper GI endoscopy was accomplished without                            difficulty. The patient tolerated the procedure                            well. Scope In: 7:45:35 AM Scope Out: 7:52:10 AM Total Procedure Duration: 0 hours 6 minutes 35 seconds  Findings:      The examined esophagus was normal.      A low-grade of narrowing Schatzki ring was found at the gastroesophageal       junction(42 cm). The scope was withdrawn. Dilation was performed with a       Maloney dilator with mild resistance at 41 Fr. The dilation site was       examined following endoscope reinsertion and showed mild mucosal       disruption, moderate improvement in luminal narrowing and no perforation.      A 2 cm hiatal hernia was present.      The entire examined stomach was normal.      The duodenal bulb and second portion of the duodenum were normal. Impression:               - Normal esophagus.                           - Low-grade of narrowing Schatzki ring. Dilated.                           - 2 cm  hiatal hernia.                           - Normal stomach.                           - Normal duodenal bulb and second portion of the                            duodenum.                           - No specimens collected. Moderate Sedation:      Moderate (conscious) sedation was administered by the endoscopy nurse       and supervised by the endoscopist. The following parameters were       monitored: oxygen saturation, heart rate, blood pressure, CO2       capnography and response to care. Total physician intraservice time was       11 minutes. Recommendation:           - Patient has a contact number available for                             emergencies. The signs and symptoms of potential                            delayed complications were discussed with the                            patient. Return to normal activities tomorrow.                            Written discharge instructions were provided to the                            patient.                           - Resume previous diet today.                           - Continue present medications.                           - No aspirin, ibuprofen, naproxen, or other                            non-steroidal anti-inflammatory drugs for 3 days. Procedure Code(s):        --- Professional ---                           [phone], Esophagogastroduodenoscopy, flexible,                            transoral; diagnostic, including collection of  specimen(s) by brushing or washing, when performed                            (separate procedure)                           43450, Dilation of esophagus, by unguided sound or                            bougie, single or multiple passes                           G0500, Moderate sedation services provided by the                            same physician or other qualified health care                            professional performing a gastrointestinal                            endoscopic service that sedation supports,                            requiring the presence of an independent trained                            observer to assist in the monitoring of the                            patient's level of consciousness and physiological                            status; initial 15 minutes of intra-service time;                            patient age 32 years or older (additional time may                            be reported with [phone], as appropriate) Diagnosis Code(s):        --- Professional ---                           K22.2, Esophageal obstruction                           K44.9, Diaphragmatic  hernia without obstruction or                            gangrene                           R13.14, Dysphagia, pharyngoesophageal phase CPT copyright 2018 American Medical Association. All rights reserved. The codes documented in this report are preliminary and upon coder review may  be revised to meet current compliance requirements. [employer]  Laural Golden, MD Hildred Laser, MD 12/20/2018 8:39:48 AM This report has been signed electronically. Number of Addenda: 0

## 2018-12-21 ENCOUNTER — Telehealth (HOSPITAL_COMMUNITY): Payer: Self-pay

## 2018-12-21 NOTE — Telephone Encounter (Signed)
Pt daughter called concerned that pt's blood pressures run 268'T-419 systolic. The patient had recent bp med changes see note by [facility]-pa-c. Stated that previous provider made changes to bisoprolol to 2.5mg  bid. Advised that she keep a blood pressure log over the weekend only taking blood pressure 1-2 times daily. Advised that desired blood pressure is <622 systolic.

## 2018-12-25 ENCOUNTER — Encounter (HOSPITAL_COMMUNITY): Payer: Medicare Other

## 2018-12-25 ENCOUNTER — Encounter (HOSPITAL_COMMUNITY): Payer: Self-pay | Admitting: Internal Medicine

## 2018-12-26 ENCOUNTER — Telehealth (HOSPITAL_COMMUNITY): Payer: Self-pay | Admitting: *Deleted

## 2018-12-26 NOTE — Telephone Encounter (Signed)
Pt's daughter and wife called concerned about pt's BP, they state it is still running in the 160's, pt is on Losartan 25 mg daily at bedtime and Bisoprolol 2.5 mg BID (this was just increased to BID last week).  They state the increased Bisoprolol has not helped any.  Will send to Dr Aundra Dubin for further review and recommendations.

## 2018-12-26 NOTE — Telephone Encounter (Signed)
Increase losartan to 25 mg bid, repeat BMET 7 days.

## 2018-12-26 NOTE — Telephone Encounter (Signed)
Spoke w/pt's wife, she states pt has been taking Losartan 25 mg BID, upon review of chart Losartan was increased to 25 BID on 3/2 by Oda Kilts, PA will send to Dr Aundra Dubin for further recommendations

## 2018-12-27 ENCOUNTER — Encounter: Payer: Self-pay | Admitting: Internal Medicine

## 2018-12-27 ENCOUNTER — Ambulatory Visit: Payer: Medicare Other | Admitting: Internal Medicine

## 2018-12-27 ENCOUNTER — Encounter (HOSPITAL_COMMUNITY): Payer: Medicare Other

## 2018-12-27 ENCOUNTER — Other Ambulatory Visit: Payer: Self-pay

## 2018-12-27 VITALS — BP 124/64 | HR 64 | Ht 68.0 in | Wt 136.2 lb

## 2018-12-27 DIAGNOSIS — I471 Supraventricular tachycardia: Secondary | ICD-10-CM

## 2018-12-27 DIAGNOSIS — Z950 Presence of cardiac pacemaker: Secondary | ICD-10-CM | POA: Diagnosis not present

## 2018-12-27 DIAGNOSIS — J449 Chronic obstructive pulmonary disease, unspecified: Secondary | ICD-10-CM | POA: Diagnosis not present

## 2018-12-27 NOTE — Telephone Encounter (Signed)
Can increase to 50 qam/25 qpm

## 2018-12-27 NOTE — Telephone Encounter (Signed)
Have attempted to call pt twice today, Left message to call back

## 2018-12-27 NOTE — Patient Instructions (Signed)
Medication Instructions:  No changes If you need a refill on your cardiac medications before your next appointment, please call your pharmacy.   Lab work: none If you have labs (blood work) drawn today and your tests are completely normal, you will receive your results only by: Marland Kitchen MyChart Message (if you have MyChart) OR . A paper copy in the mail If you have any lab test that is abnormal or we need to change your treatment, we will call you to review the results.  Testing/Procedures: none  Follow-Up: At [facility], you and your health needs are our priority.  As part of our continuing mission to provide you with exceptional heart care, we have created designated Provider Care Teams.  These Care Teams include your primary Cardiologist (physician) and Advanced Practice Providers (APPs -  Physician Assistants and Nurse Practitioners) who all work together to provide you with the care you need, when you need it. You will need a follow up appointment in 6 months.  Please call our office 2 months in advance to schedule this appointment.  You may see Cristopher Peru, MD or one of the following Advanced Practice Providers on your designated Care Team:   Chanetta Marshall, NP . Tommye Standard, PA-C  Any Other Special Instructions Will Be Listed Below (If Applicable).

## 2018-12-27 NOTE — Progress Notes (Signed)
HPI [name] returns today for followup. He is a pleasant 78 yo man with multiple medical problems including CHF, COPD, atrial tachy, LBBB, s/p PPM insertion. In the interim, he was in the ED with food stuck in his esophagus but this cleared spontaneously. In the interim he has gradually slowed. His family is convinced that his heart is causing him to slow down. He has only mild LV dysfunction. He was undergoing pulmonary rehab but he stopped. A CT scan over a year ago demonstrated moderate cerebral atrophy. He has an elevated pacing threshold in both the RV and LV.  Allergies  Allergen Reactions  . Spironolactone Other (See Comments)    gynecomastia     Current Outpatient Medications  Medication Sig Dispense Refill  . albuterol (PROVENTIL) (2.5 MG/3ML) 0.083% nebulizer solution Take 2.5 mg by nebulization daily.    Marland Kitchen aspirin EC 81 MG tablet Take 1 tablet (81 mg total) by mouth at bedtime.    . bisoprolol (ZEBETA) 5 MG tablet Take 2.5 mg by mouth 2 (two) times daily.    . Calcium Carb-Cholecalciferol (CALCIUM 500 + D3 PO) Take 1 tablet by mouth at bedtime.     . Cyanocobalamin (VITAMIN B-12 IJ) Inject 1,000 mcg as directed every 30 (thirty) days.     Marland Kitchen eplerenone (INSPRA) 25 MG tablet Take 1 tablet (25 mg total) by mouth daily. Take at bed time (Patient taking differently: Take 12.5 mg by mouth daily. ) 45 tablet 3  . FLUoxetine (PROZAC) 10 MG capsule Take 10 mg by mouth at bedtime.    . fluticasone (FLONASE) 50 MCG/ACT nasal spray Place 1 spray into both nostrils daily as needed (for sinus issues.).     Marland Kitchen furosemide (LASIX) 20 MG tablet Take 3 tablets (60 mg total) by mouth every morning. 120 tablet 2  . levothyroxine (SYNTHROID, LEVOTHROID) 125 MCG tablet Take 125 mcg by mouth daily before breakfast.    . losartan (COZAAR) 25 MG tablet Take 25 mg by mouth 2 (two) times daily.    . memantine (NAMENDA) 10 MG tablet TAKE 1 TABLET BY MOUTH TWICE A DAY (Patient taking differently: Take  10 mg by mouth 2 (two) times daily. ) 180 tablet 4  . pantoprazole (PROTONIX) 40 MG tablet Take 1 tablet (40 mg total) by mouth daily. 90 tablet 2  . Polyethyl Glycol-Propyl Glycol (LUBRICANT EYE DROPS) 0.4-0.3 % SOLN Place 1-2 drops into both eyes 2 (two) times daily as needed (for dry/irritated eyes.).    Marland Kitchen potassium chloride SA (K-DUR,KLOR-CON) 20 MEQ tablet Take 1 tablet (20 mEq total) by mouth daily. 90 tablet 3  . rosuvastatin (CRESTOR) 10 MG tablet Take 1 tablet (10 mg total) by mouth daily. (Patient taking differently: Take 10 mg by mouth every evening. ) 30 tablet 6   No current facility-administered medications for this visit.      Past Medical History:  Diagnosis Date  . AICD (automatic cardioverter/defibrillator) present   . Aortic insufficiency     moderate aortic ins moderate/asymptomatic/normal LV cavity size.   . Carotid artery disease ([place])     less than 50% stenosis bilaterally  . Carotid artery disease ([place])   . Chronic systolic CHF (congestive heart failure) ([place])   . Coronary artery disease   . Drug-induced gynecomastia     secondary to spironolactone  . Dyslipidemia   . Hypertension   . ICD (implantable cardiac defibrillator), biventricular, in situ     Medtronic model  [id number]  .  Ischemic cardiomyopathy     status post non-ST elevation microinfarction stent to the right coronary artery is a   . LV dysfunction     NYHA class I/prior ejection fraction 20-25% and an ejection fraction 7 2009 50-55%, ejection fraction 60% bedside echocardiogram July 2012   . PONV (postoperative nausea and vomiting)   . Pulmonary embolism ([facility])    Prior history of pulmonary embolism off Coumadin.  . Ventricular tachycardia ([place])    Inducible ventricular polymorphic tachycardia status post ICD followed by upgrade with CRT-D 2007, battery change at 01/31/2011    ROS:   All systems reviewed and negative except as noted in the HPI.   Past Surgical History:  Procedure Laterality  Date  . ABLATION OF DYSRHYTHMIC FOCUS  10/20/2017   atrial tach  . ATRIAL TACH ABLATION N/A 10/20/2017   Procedure: ATRIAL TACH ABLATION;  Surgeon: Evans Lance, MD;  Location: [facility];  Service: Cardiovascular;  Laterality: N/A;  . BIOPSY  12/20/2018   Procedure: BIOPSY;  Surgeon: Rogene Houston, MD;  [facility];  Service: Endoscopy;;  colon  . CARDIAC CATHETERIZATION    . CARDIAC CATHETERIZATION N/A 12/10/2015   Procedure: Right/Left Heart Cath and Coronary Angiography;  Surgeon: Peter M Martinique, MD;  Location: [facility];  Service: Cardiovascular;  Laterality: N/A;  . CARDIAC DEFIBRILLATOR PLACEMENT     medtronic, remote - yes  . CATARACT EXTRACTION W/PHACO Left 08/25/2014   Procedure: CATARACT EXTRACTION PHACO AND INTRAOCULAR LENS PLACEMENT (IOC);  Surgeon: Tonny Branch, MD;  Location: [facility];  Service: Ophthalmology;  Laterality: Left;  CDE 5.79  . CATARACT EXTRACTION W/PHACO Right 09/18/2014   Procedure: CATARACT EXTRACTION PHACO AND INTRAOCULAR LENS PLACEMENT RIGHT EYE;  Surgeon: Tonny Branch, MD;  Location: [facility];  Service: Ophthalmology;  Laterality: Right;  CDE:3.35  . COLONOSCOPY N/A 12/20/2018   Procedure: COLONOSCOPY;  Surgeon: Rogene Houston, MD;  [facility];  Service: Endoscopy;  Laterality: N/A;  730  . CORONARY STENT PLACEMENT    . EP IMPLANTABLE DEVICE N/A 05/03/2016   Procedure: BIV PPM Generator Changeout;  Surgeon: Evans Lance, MD;  Location: [facility];  Service: Cardiovascular;  Laterality: N/A;  . ESOPHAGOSCOPY WITH DILITATION  12/20/2018   Procedure: ESOPHAGOSCOPY WITH DILITATION;  Surgeon: Rogene Houston, MD;  [facility];  Service: Endoscopy;;  . INGUINAL HERNIA REPAIR    . left breast biopsy    . POLYPECTOMY  12/20/2018   Procedure: POLYPECTOMY;  Surgeon: Rogene Houston, MD;  [facility];  Service: Endoscopy;;  colon  . RETROPUBIC PROSTATECTOMY    . THORACOTOMY     left  . THYROID  SURGERY    . THYROIDECTOMY       Family History  Problem Relation Age of Onset  . Diabetes type I Sister        daughter   . Lung cancer Brother        several types of cancer  . Lung cancer Brother        several types of cancer  . Lung cancer Brother   . Clotting disorder Neg Hx        also no repiratory disease   . Prostate cancer Neg Hx      Social History   Socioeconomic History  . Marital status: Married    Spouse name: Not on file  . Number of children: 2  . Years of education: 10  .  Highest education level: Not on file  Occupational History  . Occupation: Development worker, community - self employed    Employer: RETIRED  Social Needs  . Financial resource strain: Not hard at all  . Food insecurity:    Worry: Never true    Inability: Never true  . Transportation needs:    Medical: No    Non-medical: No  Tobacco Use  . Smoking status: Former Smoker    Packs/day: 1.00    Years: 43.00    Pack years: 43.00    Types: Cigarettes    Start date: 10/17/1957    Last attempt to quit: 12/13/2000    Years since quitting: 18.0  . Smokeless tobacco: Never Used  . Tobacco comment: started at age 36  Substance and Sexual Activity  . Alcohol use: No    Alcohol/week: 0.0 standard drinks  . Drug use: No  . Sexual activity: Not Currently  Lifestyle  . Physical activity:    Days per week: Not on file    Minutes per session: Not on file  . Stress: Not on file  Relationships  . Social connections:    Talks on phone: Not on file    Gets together: Not on file    Attends religious service: Not on file    Active member of club or organization: Not on file    Attends meetings of clubs or organizations: Not on file    Relationship status: Not on file  . Intimate partner violence:    Fear of current or ex partner: Not on file    Emotionally abused: Not on file    Physically abused: Not on file    Forced sexual activity: Not on file  Other Topics Concern  . Not on file  Social History  Narrative   Self employed Development worker, community; 2 daughters.      BP 124/64   Pulse 64   Ht 5\' 8"  (1.727 m)   Wt 136 lb 3.2 oz (61.8 kg)   SpO2 94%   BMI 20.71 kg/m   Physical Exam:  Well appearing NAD HEENT: Unremarkable Neck:  No JVD, no thyromegally Lymphatics:  No adenopathy Back:  No CVA tenderness Lungs:  Clear with no wheezes HEART:  Regular rate rhythm, no murmurs, no rubs, no clicks Abd:  soft, positive bowel sounds, no organomegally, no rebound, no guarding Ext:  2 plus pulses, no edema, no cyanosis, no clubbing Skin:  No rashes no nodules Neuro:  CN II through XII intact, motor grossly intact   DEVICE  Normal device function.  See PaceArt for details.   Assess/Plan: 1. Chronic systolic heart failure - his last EF was only mildly decreased. I discussed that it is unlikely that his neuro limitations are related to his CHF.  2. Cerebral atrophy - it looks to me like he has developed some dementiia. He is pending neuro evaluation.  3. Atrial tachy - he is s/p ablation of a focal LA tachycardia and has not had any recurrence.  4. PPM - his Medtronic Biv PPM is working satisfactorily except that the RV and LV thresholds are elevated.   [name].D.

## 2018-12-28 ENCOUNTER — Other Ambulatory Visit (HOSPITAL_COMMUNITY): Payer: Self-pay | Admitting: *Deleted

## 2018-12-28 MED ORDER — LOSARTAN POTASSIUM 25 MG PO TABS: ORAL_TABLET | ORAL | Status: DC

## 2018-12-28 NOTE — Progress Notes (Signed)
Med list updated per phone mess 12/26/2018 from Dr Aundra Dubin

## 2018-12-28 NOTE — Telephone Encounter (Signed)
Spoke w/pt's wife, she is aware and agreeable, does not need more pills at this time, she will call when she needs it filled, med list updated

## 2018-12-30 ENCOUNTER — Telehealth: Payer: Self-pay | Admitting: *Deleted

## 2018-12-30 NOTE — Telephone Encounter (Signed)
Left message letting patient know our office will be closed, on 12/31/2018, to disinfect the facility.  We will call back to reschedule the appointment.  

## 2018-12-31 ENCOUNTER — Ambulatory Visit (HOSPITAL_COMMUNITY)
Admission: RE | Admit: 2018-12-31 | Discharge: 2018-12-31 | Disposition: A | Discharge status: Home / Self Care | Payer: Medicare Other | Source: Ambulatory Visit | Attending: Cardiology | Admitting: Cardiology

## 2018-12-31 ENCOUNTER — Other Ambulatory Visit (HOSPITAL_COMMUNITY): Payer: Self-pay | Admitting: *Deleted

## 2018-12-31 ENCOUNTER — Telehealth: Payer: Self-pay | Admitting: Neurology

## 2018-12-31 ENCOUNTER — Ambulatory Visit: Payer: Medicare Other | Admitting: Neurology

## 2018-12-31 ENCOUNTER — Other Ambulatory Visit: Payer: Self-pay

## 2018-12-31 DIAGNOSIS — I5022 Chronic systolic (congestive) heart failure: Secondary | ICD-10-CM

## 2018-12-31 LAB — BASIC METABOLIC PANEL
Anion gap: 6 (ref 5–15)
BUN: 19 mg/dL (ref 8–23)
CO2: 31 mmol/L (ref 22–32)
Calcium: 9.4 mg/dL (ref 8.9–10.3)
Chloride: 106 mmol/L (ref 98–111)
Creatinine, Ser: 0.94 mg/dL (ref 0.61–1.24)
GFR calc Af Amer: >60 mL/min (ref >60)
GFR calc non Af Amer: >60 mL/min (ref >60)
Glucose, Bld: 76 mg/dL (ref 70–99)
Potassium: 4.1 mmol/L (ref 3.5–5.1)
SODIUM: 143 mmol/L (ref 135–145)

## 2018-12-31 NOTE — Telephone Encounter (Signed)
I saw him last in September 2019, please check to see how is he doing, we may continue to refill Namenda 10 mg twice a day, ask him to reschedule with [place]

## 2018-12-31 NOTE — Telephone Encounter (Signed)
Spoke to patient's wife (on [place]).  States his confusion is worse and she would like for him to be seen within the next couple of weeks.  He has been rescheduled to 01/07/2019.

## 2019-01-01 ENCOUNTER — Encounter (HOSPITAL_COMMUNITY): Payer: Medicare Other

## 2019-01-01 ENCOUNTER — Other Ambulatory Visit (HOSPITAL_COMMUNITY): Payer: Medicare Other

## 2019-01-01 LAB — CUP PACEART INCLINIC DEVICE CHECK
Date Time Interrogation Session: 20200317092641
Implantable Lead Implant Date: 20050810
Implantable Lead Implant Date: 20050810
Implantable Lead Location: 753859
Implantable Lead Location: 753860
Implantable Lead Model: 5076
Implantable Lead Model: 6949
Implantable Pulse Generator Implant Date: 20170718
MDC IDC LEAD IMPLANT DT: 20071015
MDC IDC LEAD LOCATION: 753858

## 2019-01-03 ENCOUNTER — Encounter (HOSPITAL_COMMUNITY): Payer: Medicare Other

## 2019-01-03 ENCOUNTER — Telehealth: Payer: Self-pay | Admitting: Neurology

## 2019-01-03 ENCOUNTER — Telehealth (INDEPENDENT_AMBULATORY_CARE_PROVIDER_SITE_OTHER): Payer: Medicare Other | Admitting: Neurology

## 2019-01-03 DIAGNOSIS — R413 Other amnesia: Secondary | ICD-10-CM | POA: Diagnosis not present

## 2019-01-03 NOTE — Telephone Encounter (Signed)
Chief Complains/Reason for telephone visit: Follow up visit  Names of all people present and their role: Patient and his wife  Relevant History: [name] is a 78 year old male, seen in request by his cardiologist Dr. Lovena Le and his primary care physician Dr. Woody Seller, Rennis Petty for evaluation of altered mental status, initial evaluation was on July 02, 2018.  I have reviewed and summarized the referring note from the referring physician.  He has past medical history of hypertension, hyperlipidemia, atrial tachycardia, status post ablation in January 2019, COPD, congestive heart failure, he also has pacemaker, is not an MRI candidate,  Since his ablation surgery in January 2019, he was noted to have worsening confusion, he complains of feeling fatigue, do not have energy over doing something, also has worsening anxiety, tends to become confused, lost train of thoughts during the middle of the conversation, he also become much less active, always moving slow, now has mild worsening gait abnormality, sometimes he went to the kitchen try to fixing a cup of coffee, but could not remember where he laid his cup, he also has hard of hearing  Personally reviewed CT angiogram of head in February 2019, atherosclerotic calcification, mild stenosis in the carotid siphon bilaterally, and in the distal vertebral artery bilaterally, otherwise no significant intracranial stenosis.  Atrophy and chronic microvascular ischemic change in the white matter.  We reviewed laboratory evaluation in September 2019, normal TSH, free T4, BMP showed no significant abnormality, B12 in February 2019 was mildly decreased 218, he was given vitamin B12 IM supplement, which has helped him some,  He denies a family history of dementia, he is not taking aspirin daily  TELEPHONE VISIT on January 03 2019: Patient and his wife complains of worsening confusion, frequent headaches.   Previous CT head showed atrophy, small vessel  disease, no acute abnormalities.  Wife desires more image study. He tolerated Namenda 10mg  bid, but no significant improvement  Results Reviewed:  Assessment and Plan: Memory loss, Increased confusions             Likely central nervous system degenerative disorder, likely a vascular component, not MRI candidate due to pacemaker, angiogram of the head showed generalized generalized atrophy, worsening at bilateral frontal region, there is no acute abnormality.             Continue moderate exercise,             Optimize vascular risk factor control             keep Namenda 10 mg twice a day  Order CT head wo  Documentation of Time of Medical Discussion:  Total phone time 11 minutes

## 2019-01-03 NOTE — Telephone Encounter (Signed)
error 

## 2019-01-04 ENCOUNTER — Telehealth: Payer: Self-pay | Admitting: *Deleted

## 2019-01-04 NOTE — Telephone Encounter (Signed)
Called and spoke with wife. Advised appt 01/07/19 cx d/t concerns w/ coronavirus. Wife states they already spoke with Dr. Krista Blue yesterday and she already cx it. Advised we will f/u on Monday to get appt r/s for a later time. She verbalized understanding.

## 2019-01-07 ENCOUNTER — Ambulatory Visit: Payer: Self-pay | Admitting: Neurology

## 2019-01-07 ENCOUNTER — Encounter: Payer: Medicare Other | Admitting: *Deleted

## 2019-01-07 ENCOUNTER — Other Ambulatory Visit (HOSPITAL_COMMUNITY): Payer: Self-pay | Admitting: Cardiology

## 2019-01-07 ENCOUNTER — Other Ambulatory Visit: Payer: Self-pay

## 2019-01-07 MED ORDER — LOSARTAN POTASSIUM 25 MG PO TABS: ORAL_TABLET | ORAL | 3 refills | Status: DC

## 2019-01-08 ENCOUNTER — Ambulatory Visit (INDEPENDENT_AMBULATORY_CARE_PROVIDER_SITE_OTHER): Payer: Medicare Other

## 2019-01-08 ENCOUNTER — Telehealth: Payer: Self-pay

## 2019-01-08 ENCOUNTER — Other Ambulatory Visit: Payer: Self-pay

## 2019-01-08 ENCOUNTER — Encounter (HOSPITAL_COMMUNITY): Payer: Medicare Other

## 2019-01-08 DIAGNOSIS — Z950 Presence of cardiac pacemaker: Secondary | ICD-10-CM

## 2019-01-08 DIAGNOSIS — I5022 Chronic systolic (congestive) heart failure: Secondary | ICD-10-CM

## 2019-01-08 NOTE — Telephone Encounter (Signed)
Spoke with patient to remind of missed remote transmission 

## 2019-01-09 ENCOUNTER — Telehealth (HOSPITAL_COMMUNITY): Payer: Self-pay

## 2019-01-09 ENCOUNTER — Telehealth (HOSPITAL_COMMUNITY): Payer: Self-pay | Admitting: Adult Health

## 2019-01-09 NOTE — Telephone Encounter (Signed)
PA initiated for Losartan 25mg  (2 tabs in AM and 1 in PM).  Awaiting result.  Wife aware decision can take up to 3 days.  If approved, pt wants 90 day supply sent to pharmacy.  Wife also inquired about Eplerenone patient assistance. Will forward to SW.

## 2019-01-09 NOTE — Telephone Encounter (Signed)
   Called from [place] wife.    He needs a refill for losartan. Please call.   PreAuth number [id number]  Please call his wife back.   Thanks Amy Clegg NP-C

## 2019-01-10 ENCOUNTER — Encounter (HOSPITAL_COMMUNITY): Payer: Medicare Other

## 2019-01-11 ENCOUNTER — Telehealth (HOSPITAL_COMMUNITY): Payer: Self-pay | Admitting: Licensed Clinical Social Worker

## 2019-01-11 NOTE — Progress Notes (Signed)
EPIC Encounter for ICM Monitoring  Patient Name: [name] is a 78 y.o. male Date: 01/11/2019 Primary Care Physican: Glenda Chroman, MD Primary Cardiologist:[place] Electrophysiologist: Santina Evans Pacing:98.1% Last Weight:128lbs  01/11/2019 Weight:unknown   Spoke with wife.  Pt is symptomatic.  She said they have changed insurance this year and cannot afford monthly fluid checks.  Advised remotes can be done every 3 months and will place him on that schedule with device clinic.  Device clinic will continue to follow per protocol.    Thoracic impedance normal.   Prescribed:Furosemide20 mg3tablets (60 mg total)daily.Potassium 20 mEq take 1 tablet daily.  Labs: 12/31/2018 Creatinine 0.94, BUN 19, Potassium 4.1, Sodium 143, GFR >60 12/17/2018 Creatinine 1.00, BUN 24, Potassium 4.5, Sodium 141, GFR >60  12/06/2018 Creatinine 1.01, BUN 19, Potassium 4.2, Sodium 141, GFR >60  09/04/2018 Creatinine 1.06, BUN 16, Potassium 4.0, Sodium 139, GFR >60 08/16/2018 Creatinine 1.03, BUN 15, Potassium 4.1, Sodium 139, GFR >60 08/01/2018 Creatinine 1.07, BUN 19, Potassium 4.0, Sodium 142, GFR >60 06/21/2018 Creatinine 1.26, BUN 17, Potassium 3.7, Sodium 144, GFR 54->60 06/11/2018 Creatinine 1.00, BUN 20, Potassium 4.0, Sodium 145, GFR >60 03/16/2018 Creatinine 0.97, BUN 21, Potassium 4.3, Sodium 141, GFR >60 A complete set of results can be found in Results Review.  Recommendations:No changes .  Follow-up plan: Disenrolled from [facility] per wife, DPR.   Copy of ICM check sent to Dr.Taylor   3 month ICM trend: 01/08/2019   1 Year ICM trend:       Rosalene Billings, RN 01/11/2019 10:00 AM

## 2019-01-11 NOTE — Telephone Encounter (Signed)
CSW consulted to assist with medication concerns.  Pt was enrolled in [employer] program for Inspra assistance but this application has expired.  CSW emailed new application to pt daughter how helped complete with pt and sent back to [place].  CSW sent in to MD clinic to have MD portion completed and faxed in for review  CSW will continue to follow and assist as needed.  [name], LCSW Clinical Social Worker [facility] Desk#: [phone] Cell#: [phone]

## 2019-01-11 NOTE — Telephone Encounter (Signed)
Losartan approved for qt 3 tabs per day through 10/17/2019

## 2019-01-14 ENCOUNTER — Telehealth (HOSPITAL_COMMUNITY): Payer: Self-pay

## 2019-01-14 NOTE — Telephone Encounter (Signed)
[employer] Patient Assistance Reenrollment form signed by DM faxed, confirmation received.

## 2019-01-15 ENCOUNTER — Ambulatory Visit: Payer: Medicare Other | Admitting: Internal Medicine

## 2019-01-15 ENCOUNTER — Encounter (HOSPITAL_COMMUNITY): Payer: Medicare Other

## 2019-01-17 ENCOUNTER — Encounter (HOSPITAL_COMMUNITY): Payer: Medicare Other

## 2019-01-21 ENCOUNTER — Telehealth: Payer: Self-pay

## 2019-01-21 NOTE — Telephone Encounter (Signed)
Returned wife's call as requested by voice mail message.  She asked when the next remote transmission is scheduled.  She received a letter saying the March 23rd remote was not received.  Advised that the next transmission is 04/08/2019 and for future follow up to call the phone number on the letter she received. Advised I do not make the remote transmission schedule since she opted out of ICM follow up.  No further questions.

## 2019-01-21 NOTE — Telephone Encounter (Signed)
Follow Up:   Wife calling said she received a letter about sending transmission on 02-07-19. She wants to know if he still needs to do that, because he did one on 01-03-19.

## 2019-01-22 ENCOUNTER — Encounter (HOSPITAL_COMMUNITY): Payer: Medicare Other

## 2019-01-22 ENCOUNTER — Other Ambulatory Visit (HOSPITAL_COMMUNITY): Payer: Self-pay

## 2019-01-22 ENCOUNTER — Telehealth (HOSPITAL_COMMUNITY): Payer: Self-pay | Admitting: Licensed Clinical Social Worker

## 2019-01-22 MED ORDER — EPLERENONE 25 MG PO TABS: 25.0000 mg | ORAL_TABLET | Freq: Every day | ORAL | 3 refills | Status: DC

## 2019-01-22 NOTE — Telephone Encounter (Signed)
CSW received call from pt wife regarding Inspra patient assistance.  Per patient wife she spoke with foundation and they have approved pt for assistance but need a prescription sent in so they can fill it- CSW requested clinic staff send in prescription  CSW will continue to follow and assist as needed  [name], [facility] Desk#: [phone] Cell#: [phone]

## 2019-01-23 NOTE — Telephone Encounter (Signed)
Faxed signed script for inspra to patient assistance.

## 2019-01-24 ENCOUNTER — Encounter (HOSPITAL_COMMUNITY): Payer: Medicare Other

## 2019-01-29 ENCOUNTER — Encounter (HOSPITAL_COMMUNITY): Payer: Medicare Other

## 2019-01-31 ENCOUNTER — Encounter (HOSPITAL_COMMUNITY): Payer: Medicare Other

## 2019-02-05 ENCOUNTER — Other Ambulatory Visit (HOSPITAL_COMMUNITY): Payer: Medicare Other

## 2019-02-06 ENCOUNTER — Ambulatory Visit (HOSPITAL_COMMUNITY)
Admission: RE | Admit: 2019-02-06 | Discharge: 2019-02-06 | Disposition: A | Discharge status: Home / Self Care | Payer: Medicare Other | Source: Ambulatory Visit | Attending: Cardiology | Admitting: Cardiology

## 2019-02-06 ENCOUNTER — Other Ambulatory Visit: Payer: Self-pay

## 2019-02-06 DIAGNOSIS — I5022 Chronic systolic (congestive) heart failure: Secondary | ICD-10-CM

## 2019-02-06 LAB — BASIC METABOLIC PANEL
Anion gap: 7 (ref 5–15)
BUN: 19 mg/dL (ref 8–23)
CO2: 30 mmol/L (ref 22–32)
Calcium: 9.5 mg/dL (ref 8.9–10.3)
Chloride: 106 mmol/L (ref 98–111)
Creatinine, Ser: 0.93 mg/dL (ref 0.61–1.24)
GFR calc Af Amer: >60 mL/min (ref >60)
GFR calc non Af Amer: >60 mL/min (ref >60)
Glucose, Bld: 76 mg/dL (ref 70–99)
Potassium: 4.3 mmol/L (ref 3.5–5.1)
Sodium: 143 mmol/L (ref 135–145)

## 2019-02-07 ENCOUNTER — Telehealth (HOSPITAL_COMMUNITY): Payer: Self-pay | Admitting: Cardiology

## 2019-02-07 NOTE — Telephone Encounter (Signed)
RETURNED CALL TO PATIENT REGARDING LAB RESULTS-LABS STABLE/OK PER DM   PATIENTS WIFE WOULD LIKE AN VIRTUAL VISIT TO FURTHER DISCUSS INCREASED DIZZINESS AND CONFUSION. WIFE REPORTS PATIENT HAS BEEN WEAK FOR QUITE SOMETIME AND THEY HAVE BEEN "MONITORING". FEELS IT COULD BE RELATED TO BISOPROLOL AS PATIENT FELT BETTER W/O MED B/P 170/89   ADVISED I COULD SCHEDULE VIRTUAL VISIT WITH PROVIDER AND SEND MESSAGE FOR FURTHER INSTRUCTIONS IF NEEDED PRIOR TO VISIT   PLEASE ADVISE FURTHER IF NEEDED

## 2019-02-08 NOTE — Telephone Encounter (Signed)
   Please schedule for visit with Dr Aundra Dubin next week. I will be happy to call them today or they can discuss with Dr Aundra Dubin next week.    I would also ask that they follow up with his Neurologist given ongoing confusion/memory issues. He is followed by Dr Krista Blue.   Amy Clegg NP-C  8:51 AM

## 2019-02-11 NOTE — Telephone Encounter (Signed)
Pt wife aware of a ppt 

## 2019-02-13 ENCOUNTER — Ambulatory Visit (HOSPITAL_COMMUNITY)
Admission: RE | Admit: 2019-02-13 | Discharge: 2019-02-13 | Disposition: A | Discharge status: Home / Self Care | Payer: Medicare Other | Source: Ambulatory Visit | Attending: Internal Medicine | Admitting: Internal Medicine

## 2019-02-13 ENCOUNTER — Other Ambulatory Visit: Payer: Self-pay

## 2019-02-13 VITALS — BP 150/82 | Wt 131.0 lb

## 2019-02-13 DIAGNOSIS — I5022 Chronic systolic (congestive) heart failure: Secondary | ICD-10-CM

## 2019-02-13 DIAGNOSIS — Z79899 Other long term (current) drug therapy: Secondary | ICD-10-CM

## 2019-02-13 DIAGNOSIS — I255 Ischemic cardiomyopathy: Secondary | ICD-10-CM

## 2019-02-13 DIAGNOSIS — I5032 Chronic diastolic (congestive) heart failure: Secondary | ICD-10-CM

## 2019-02-13 NOTE — Progress Notes (Signed)
Spoke with wife discuss AVS.  All questions answered. AVS mailed as requested.

## 2019-02-13 NOTE — Addendum Note (Signed)
Encounter addended by: Valeda Malm, RN on: 02/13/2019 3:46 PM  Actions taken: Order list changed, Order Reconciliation Section accessed, Home Medications modified, Medication List reviewed, Clinical Note Signed

## 2019-02-13 NOTE — Progress Notes (Signed)
Heart Failure TeleHealth Note  Due to national recommendations of social distancing due to [place] 19, Audio/video telehealth visit is felt to be most appropriate for this patient at this time.  See MyChart message from today for patient consent regarding telehealth for [facility].  Date:  02/13/2019   ID:  [name], DOB 10-10-1941, MRN [id number]  Location: Home  Provider location: [place] [facility] Type of Visit: Established patient  PCP:  Glenda Chroman, MD  Cardiologist:  Dr Gwenlyn Found  Primary HF: Dr Aundra Dubin   Chief Complaint: HTN/HF.  History of Present Illness: [name] is a 78 y.o. male with a history CAD and a prior PCI to the RCA with BMS.  Most recent cath in 2/17 showed nonobstructive CAD, patent RCA stent.  In 2009, echo showed EF 20-25%.  He went on to have ICD placed and later, CRT-P when he developed complete heart block requiring CRT upgrade (no longer has defibrillator). Echo in 1/17 showed EF improved to 55-60%. Echo in 5/19 showed EF 40-45%, moderate LVH, mild-moderate AI, normal RV.    He presents via audio conferencing for a telehealth visit today due to elevated blood pressure and hypoglycemia. His wife reports low pressure and multiple medications changes. She says his PCP checked his TSH recently but she does not have those results. His wife is adamant he doesn't have dementia. Overall feeling fair. Denies PND/Orthopnea. Appetite poor. His wife has been giving him supplements.  He has been able walk 1/2 mile treadmill No fever or chills. Weight at home 129-131 pounds. Taking all medications but his wife has cut back losartan to 12.5 mg daily. She said his BP dropped on higher doses .    he denies symptoms worrisome for COVID 19.   Past Medical History:  Diagnosis Date  . AICD (automatic cardioverter/defibrillator) present   . Aortic insufficiency     moderate aortic ins moderate/asymptomatic/normal LV cavity size.   . Carotid artery  disease ([place])     less than 50% stenosis bilaterally  . Carotid artery disease ([place])   . Chronic systolic CHF (congestive heart failure) ([place])   . Coronary artery disease   . Drug-induced gynecomastia     secondary to spironolactone  . Dyslipidemia   . Hypertension   . ICD (implantable cardiac defibrillator), biventricular, in situ     Medtronic model  [id number]  . Ischemic cardiomyopathy     status post non-ST elevation microinfarction stent to the right coronary artery is a   . LV dysfunction     NYHA class I/prior ejection fraction 20-25% and an ejection fraction 7 2009 50-55%, ejection fraction 60% bedside echocardiogram July 2012   . PONV (postoperative nausea and vomiting)   . Pulmonary embolism (HCC)    Prior history of pulmonary embolism off Coumadin.  . Ventricular tachycardia ([place])    Inducible ventricular polymorphic tachycardia status post ICD followed by upgrade with CRT-D 2007, battery change at 01/31/2011   Past Surgical History:  Procedure Laterality Date  . ABLATION OF DYSRHYTHMIC FOCUS  10/20/2017   atrial tach  . ATRIAL TACH ABLATION N/A 10/20/2017   Procedure: ATRIAL TACH ABLATION;  Surgeon: Evans Lance, MD;  Location: [place] [facility];  Service: Cardiovascular;  Laterality: N/A;  . BIOPSY  12/20/2018   Procedure: BIOPSY;  Surgeon: Rogene Houston, MD;  Location: [facility];  Service: Endoscopy;;  colon  . CARDIAC CATHETERIZATION    . CARDIAC CATHETERIZATION N/A  12/10/2015   Procedure: Right/Left Heart Cath and Coronary Angiography;  Surgeon: Peter M Martinique, MD;  Location: [place] [facility];  Service: Cardiovascular;  Laterality: N/A;  . CARDIAC DEFIBRILLATOR PLACEMENT     medtronic, remote - yes  . CATARACT EXTRACTION W/PHACO Left 08/25/2014   Procedure: CATARACT EXTRACTION PHACO AND INTRAOCULAR LENS PLACEMENT (IOC);  Surgeon: Tonny Branch, MD;  Location: [facility];  Service: Ophthalmology;  Laterality: Left;  CDE 5.79  . CATARACT EXTRACTION W/PHACO Right  09/18/2014   Procedure: CATARACT EXTRACTION PHACO AND INTRAOCULAR LENS PLACEMENT RIGHT EYE;  Surgeon: Tonny Branch, MD;  Location: [facility];  Service: Ophthalmology;  Laterality: Right;  CDE:3.35  . COLONOSCOPY N/A 12/20/2018   Procedure: COLONOSCOPY;  Surgeon: Rogene Houston, MD;  Location: [facility];  Service: Endoscopy;  Laterality: N/A;  730  . CORONARY STENT PLACEMENT    . EP IMPLANTABLE DEVICE N/A 05/03/2016   Procedure: BIV PPM Generator Changeout;  Surgeon: Evans Lance, MD;  Location: [place] [facility];  Service: Cardiovascular;  Laterality: N/A;  . ESOPHAGOSCOPY WITH DILITATION  12/20/2018   Procedure: ESOPHAGOSCOPY WITH DILITATION;  Surgeon: Rogene Houston, MD;  [facility];  Service: Endoscopy;;  . INGUINAL HERNIA REPAIR    . left breast biopsy    . POLYPECTOMY  12/20/2018   Procedure: POLYPECTOMY;  Surgeon: Rogene Houston, MD;  [facility];  Service: Endoscopy;;  colon  . RETROPUBIC PROSTATECTOMY    . THORACOTOMY     left  . THYROID SURGERY    . THYROIDECTOMY       Current Outpatient Medications  Medication Sig Dispense Refill  . albuterol (PROVENTIL) (2.5 MG/3ML) 0.083% nebulizer solution Take 2.5 mg by nebulization daily.    Marland Kitchen aspirin EC 81 MG tablet Take 1 tablet (81 mg total) by mouth at bedtime.    . bisoprolol (ZEBETA) 5 MG tablet Take 2.5 mg by mouth daily.     . Calcium Carb-Cholecalciferol (CALCIUM 500 + D3 PO) Take 1 tablet by mouth at bedtime.     . Cyanocobalamin (VITAMIN B-12 IJ) Inject 1,000 mcg as directed every 30 (thirty) days.     Marland Kitchen eplerenone (INSPRA) 25 MG tablet Take 1 tablet (25 mg total) by mouth daily. Take at bed time (Patient taking differently: Take 12.5 mg by mouth daily. Take at bed time) 90 tablet 3  . FLUoxetine (PROZAC) 10 MG capsule Take 10 mg by mouth at bedtime.    . fluticasone (FLONASE) 50 MCG/ACT nasal spray Place 1 spray into both nostrils daily as needed (for sinus issues.).     Marland Kitchen furosemide (LASIX) 20 MG  tablet Take 3 tablets (60 mg total) by mouth every morning. 120 tablet 2  . levothyroxine (SYNTHROID, LEVOTHROID) 125 MCG tablet Take 125 mcg by mouth daily before breakfast.    . losartan (COZAAR) 25 MG tablet Take 2 tabs in AM and 1 tab in PM (Patient taking differently: 12.5 mg daily. ) 270 tablet 3  . memantine (NAMENDA) 10 MG tablet TAKE 1 TABLET BY MOUTH TWICE A DAY (Patient taking differently: Take 10 mg by mouth 2 (two) times daily. ) 180 tablet 4  . pantoprazole (PROTONIX) 40 MG tablet Take 1 tablet (40 mg total) by mouth daily. 90 tablet 2  . Polyethyl Glycol-Propyl Glycol (LUBRICANT EYE DROPS) 0.4-0.3 % SOLN Place 1-2 drops into both eyes 2 (two) times daily as needed (for dry/irritated eyes.).    Marland Kitchen potassium chloride SA (K-DUR,KLOR-CON) 20 MEQ  tablet Take 1 tablet (20 mEq total) by mouth daily. 90 tablet 3  . rosuvastatin (CRESTOR) 10 MG tablet Take 1 tablet (10 mg total) by mouth daily. (Patient taking differently: Take 10 mg by mouth every evening. ) 30 tablet 6   No current facility-administered medications for this encounter.     Allergies:   Spironolactone   Social History:  The patient  reports that he quit smoking about 18 years ago. His smoking use included cigarettes. He started smoking about 61 years ago. He has a 43.00 pack-year smoking history. He has never used smokeless tobacco. He reports that he does not drink alcohol or use drugs.   Family History:  The patient's family history includes Diabetes type I in his sister; Lung cancer in his brother, brother, and brother.   ROS:  Please see the history of present illness.   All other systems are personally reviewed and negative.   Exam:  tele Health Call; Exam is subjective  General:  Speaks in full sentences. No resp difficulty. Lungs: Normal respiratory effort with conversation.  Abdomen: Non-distended per patient report Extremities: Pt denies edema. Neuro: Alert & oriented x 3.   Recent Labs: 09/04/2018:  Magnesium 2.1 12/06/2018: TSH 1.210 12/17/2018: Hemoglobin 14.4; Platelets 179 02/06/2019: BUN 19; Creatinine, Ser 0.93; Potassium 4.3; Sodium 143  Personally reviewed   Wt Readings from Last 3 Encounters:  02/13/19 59.4 kg (131 lb)  12/27/18 61.8 kg (136 lb 3.2 oz)  12/17/18 61.7 kg (136 lb)    Vitals:   02/13/19 1453  BP: (!) 150/82     ASSESSMENT AND PLAN:  1.  Chronic Systolic Heart Failure Initially, EF low in 20-25% range in 2009.  However, over time, EF improved to 55-60% in 1/17.  Echo in 5/19 with EF 40-45%.  Echo in 10/19 with EF up to 45-50%.  Suspect primarily nonischemic cardiomyopathy as degree of coronary disease does not explain his initial low EF.  Patient has a Medtronic CRT-P device.    Symptoms are due mainly to his COPD.   - NYHA II.  Continue bisoprolol 2.5 mg daily.  - Continue Lasix 60 mg daily  - Continue losartan 12.5 mg daily. He was intolerant of higher dose of losartan due to hypotension.  - Continue eplerenone 25 mg daily.  Intolerant spironolactone.  2. COPD: His COPD appears quite severe.  He is now on Symbicort.  3. CAD: Cath in 2017 with nonobstructive disease, patent RCA stent. NO chest pain.   - Continue statin, ASA 81.  4. Aortic stenosis: Mild on last echo.  5. H/o atrial tachycardia: S/p ablation by Dr. Lovena Le, no palpitations.  6. Complete heart block: He has MDT CRT-P device.  7. Pulmonary hypertension: Noted on 2017 RHC, mild and likely group 3 from COPD. No change.  8. VT: Continue bisoprolol.  9. Dementia  On namenda per Neurology. Needs follow up with Neurology.  10. HTN Hard to know how what we should do with his medications. His wife has many concerns about medication changes and would like to try and stop some medications. She cut back his losartan. For now I will continue current regimen and not alter what she has cut back.   COVID screen The patient does not have any symptoms that suggest any further testing/ screening at this  time.  Social distancing reinforced today.  Patient Risk: After full review of this patients clinical status, I feel that they are at moderate risk for cardiac decompensation at this time.  Relevant cardiac medications were reviewed at length with the patient today. I will ask HF pharmacist to review meds with his wife. She is concerned about bisoprolol.    The following changes were made today:  Change losartan to 12.5 mg daily. I will ask HF pharmacist to review medications.   Recommended follow-up:  Follow up with Dr Aundra Dubin in 3 months.   Today, I have spent 25 minutes with the patient with telehealth technology discussing the above issues .    Jeanmarie Hubert, NP  02/13/2019 3:08 PM  [place] [address] Heart and [place] [address] [phone] (office) [phone] (fax)

## 2019-02-13 NOTE — Patient Instructions (Addendum)
Change losartan to 12.5 mg daily.  Heart Failure  phramacist will review medications and call his wife.   Recommended follow-up:  Follow up with Dr Aundra Dubin in 3 months. You will get a phone call to schedule this appointment.

## 2019-02-14 ENCOUNTER — Telehealth (HOSPITAL_COMMUNITY): Payer: Self-pay | Admitting: Pharmacist

## 2019-02-14 NOTE — Telephone Encounter (Signed)
Called patient to discuss medications  Her concern is high blood pressure in am and low and weak during the day after taking  meds  He is on 1/2 tabs of all HF meds Eplerenone 12.5mg  qd Losartan 12.5mg  qd Bisoprolol 2.5mg  qd  Furosemide 60mg  qd Per wife can not tolerated increase to full tab.  SBP in am 150-170 and dropps to 130s later in day - which I tried to explain is not too low - but she thinks it is too low for him because weak.    I asked her to try taking Bisoprolol and losartan at night and eplerenone and furosemide in am to see if this helps even out BP and decrease daytime effects of dizziness and weakness.

## 2019-03-15 ENCOUNTER — Telehealth (HOSPITAL_COMMUNITY): Payer: Self-pay | Admitting: *Deleted

## 2019-03-15 ENCOUNTER — Telehealth: Payer: Self-pay | Admitting: Cardiology

## 2019-03-15 ENCOUNTER — Telehealth: Payer: Self-pay | Admitting: Neurology

## 2019-03-15 NOTE — Telephone Encounter (Signed)
Pt daughter has called asking for a call to discuss increase in confusion for pt as well as shaking and weakness.  Daughter was offered different appointment dates but they conflict with her work schedule.  Daughter also mentioned really wanting to bring pt in to see Dr Krista Blue. Daughter was told right now it is to Dr Rhea Belton discretion which pt's she will have to come in vrs doing the virtual visits.  Daughter is aware the office is not open today, she is asking for a call on [phone]

## 2019-03-15 NOTE — Telephone Encounter (Signed)
Patient wife called and stated that pt home monitor is not working and a new monitor has been ordered. appt for today rescheduled to 03/29/2019 pt wife aware.

## 2019-03-15 NOTE — Telephone Encounter (Signed)
Pts daughter believes it his medication says his dementia is mild and they track his blood pressure and it is normal. Requests televisit. msg sent to [facility] to schedule. Pt aware schedulers will call her.

## 2019-03-15 NOTE — Telephone Encounter (Signed)
Pts daughter called and wants a call from provider about patients medications. Daughter stated patient is "not doing well mentally". Neurology and PCP cant find anything wrong with patient. She also claims patient has been weak. She thinks it may be due to some of his heart medications.   Message routed to [place] for advise. (possible telehealth visit?)

## 2019-03-15 NOTE — Telephone Encounter (Signed)
He is on Namenda, think he may have dementia though not sure wife is accepting that diagnosis.  He is on pretty minimal doses of his cardiac meds.  Have his wife check his BP daily and keep a log, set up for telehealth visit.

## 2019-03-18 NOTE — Telephone Encounter (Signed)
I spoke to the patient's daughter who reports her father's confusion spells have worsened.  She would like for him to be evaluated in the office.  Says it has been over a year since his last CT head and she is inquiring if a repeat scan can be considered.  He has been added to the schedule on 03/20/2019.  States she will discuss more at his appt with Dr. Krista Blue.

## 2019-03-19 ENCOUNTER — Other Ambulatory Visit: Payer: Self-pay

## 2019-03-19 ENCOUNTER — Ambulatory Visit (HOSPITAL_COMMUNITY)
Admission: RE | Admit: 2019-03-19 | Discharge: 2019-03-19 | Disposition: A | Discharge status: Home / Self Care | Payer: Medicare Other | Source: Ambulatory Visit | Attending: Cardiology | Admitting: Cardiology

## 2019-03-19 DIAGNOSIS — J449 Chronic obstructive pulmonary disease, unspecified: Secondary | ICD-10-CM | POA: Diagnosis not present

## 2019-03-19 DIAGNOSIS — I5022 Chronic systolic (congestive) heart failure: Secondary | ICD-10-CM | POA: Diagnosis not present

## 2019-03-19 NOTE — Patient Instructions (Signed)
Stop Bisoprolol, if symptoms of fatigue not improved in a week please restart  You have been referred to Pulmonary, they will call you to schedule this appointment  Our office will call you to schedule your next appointment.

## 2019-03-19 NOTE — Progress Notes (Signed)
Heart Failure TeleHealth Note  Due to national recommendations of social distancing due to [place] 19, Audio/video telehealth visit is felt to be most appropriate for this patient at this time.  See MyChart message from today for patient consent regarding telehealth for [facility].  Date:  03/19/2019   ID:  [name], DOB 07-01-1941, MRN [id number]  Location: Home  Provider location: [place] [facility] Failure Type of Visit: Established patient   PCP:  Glenda Chroman, MD  Cardiologist: Dr. Aundra Dubin  Chief Complaint: Shortness of breath/fatigue   History of Present Illness: [name] is a 78 y.o. male who presents via audio/video conferencing for a telehealth visit today.     he denies symptoms worrisome for COVID 19.   Patient was referred by Dr. Lovena Le for evaluation of CHF. Patient has a long cardiac history.  He had a prior PCI to the RCA with BMS.  Most recent cath in 2/17 showed nonobstructive CAD, patent RCA stent.  In 2009, echo showed EF 20-25%.  He went on to have ICD placed and later, CRT-P when he developed complete heart block requiring CRT upgrade (no longer has defibrillator). Echo in 1/17 showed EF improved to 55-60%. Echo in 5/19 showed EF 40-45%, moderate LVH, mild-moderate AI, normal RV.    Patient has been seen by pulmonary but not recently.  He does not appear to have been very compliant with inhalers.  He was started on Duonebs by Dr. Lenna Gilford but stopped it because it was "messing up my eyes."  He is currently on no regular COPD meds.    Echo in 10/19 showed EF up to 45-50% with mild AS.   Patient has been feeling worse symptomatically.  Per family, he feels bad most of the day and sleeps a lot.  He is shot of breath walking more than 50-100 feet.  No orthopnea/PND.  No chest pain.  No palpitations.  His meds have been cut back significantly due to low BP.  Currently, SBP 140s-150s in am before meds, 100s-110s after meds.  Not really lightheaded  but does have fatigue.  He has occasional confusion.  He is now using home oxygen.   Labs (1/19): LDL 80, HDL 42 Labs (4/19): K 3.7, creatinine 1.21, TSH normal Labs (5/19): K 4.3, creatinine 0.97 Labs (9/19): K 3.7, creatinine 1.26 Labs (10/19): K 4.1, creatinine 1.03, hgb 14.7, TSH 7.4, free T4 normal.  Labs (11/19): K 4, creatinine 1.06 Labs (4/20): K 4.3, creatinine 0.93 Labs (5/20): K 4, creatinine 1.03, hgb 14.7  PMH: 1. Aortic insufficiency: Mild to moderate on most recent echo in 1/17.  2. HTN 3. B12 deficiency 4. Atrial tachycardia: S/p ablation in 1/19.  5. Hypothyroidism 6. Hyperlipidemia 7. Prior PE: Not currently on anticoagulation.  8. H/o VT: had CRT-D device, CRT-P since most recent generator change. .  9. CAD: h/o BMS to RCA.  - LHC (2/17) with mild nonobstructive CAD, patent RCA stent.  10. Complete heart block: Has Medtronic CRT-P device.   11. Chronic systolic CHF: Probably primarily nonischemic CMP as he does not have extensive enough CAD to explain CMP.  - Echo (2009): EF 20-25%.  - Echo (1/17): EF 55-60%, mild LV dilation, mild-moderate AI, normal RV size and systolic function.  - RHC (2/17): mean RA 10, PA 44/10, mean PCWP 12, CI 2.18.  - Echo (5/19): EF 40-45%, moderate LVH, mild-moderate AI, normal RV size and systolic function.  - Gynecomastia with spironolactone.  -  Echo (10/19): EF 45-50%, mild AS.  12. COPD: Severe emphysema on prior chest CT.  Smoked in past.  - PFTs (12/17) with severe obstruction.  - He is on home oxygen.  13. Esophageal stricture s/p dilation in 3/20.  14. Dementia  Current Outpatient Medications  Medication Sig Dispense Refill  . albuterol (PROVENTIL) (2.5 MG/3ML) 0.083% nebulizer solution Take 2.5 mg by nebulization daily.    Marland Kitchen aspirin EC 81 MG tablet Take 1 tablet (81 mg total) by mouth at bedtime.    . Calcium Carb-Cholecalciferol (CALCIUM 500 + D3 PO) Take 1 tablet by mouth at bedtime.     . Cyanocobalamin (VITAMIN  B-12 IJ) Inject 1,000 mcg as directed every 30 (thirty) days.     Marland Kitchen eplerenone (INSPRA) 25 MG tablet Take 1 tablet (25 mg total) by mouth daily. Take at bed time (Patient taking differently: Take 12.5 mg by mouth daily. Take at bed time) 90 tablet 3  . FLUoxetine (PROZAC) 10 MG capsule Take 10 mg by mouth at bedtime.    . fluticasone (FLONASE) 50 MCG/ACT nasal spray Place 1 spray into both nostrils daily as needed (for sinus issues.).     Marland Kitchen furosemide (LASIX) 20 MG tablet Take 3 tablets (60 mg total) by mouth every morning. 120 tablet 2  . levothyroxine (SYNTHROID, LEVOTHROID) 125 MCG tablet Take 125 mcg by mouth daily before breakfast.    . losartan (COZAAR) 25 MG tablet Take 12.5 mg by mouth daily.    . memantine (NAMENDA) 10 MG tablet TAKE 1 TABLET BY MOUTH TWICE A DAY (Patient taking differently: Take 10 mg by mouth 2 (two) times daily. ) 180 tablet 4  . pantoprazole (PROTONIX) 40 MG tablet Take 1 tablet (40 mg total) by mouth daily. 90 tablet 2  . Polyethyl Glycol-Propyl Glycol (LUBRICANT EYE DROPS) 0.4-0.3 % SOLN Place 1-2 drops into both eyes 2 (two) times daily as needed (for dry/irritated eyes.).    Marland Kitchen potassium chloride SA (K-DUR,KLOR-CON) 20 MEQ tablet Take 1 tablet (20 mEq total) by mouth daily. 90 tablet 3  . rosuvastatin (CRESTOR) 10 MG tablet Take 1 tablet (10 mg total) by mouth daily. (Patient taking differently: Take 10 mg by mouth every evening. ) 30 tablet 6   No current facility-administered medications for this encounter.     Allergies:   Spironolactone   Social History:  The patient  reports that he quit smoking about 18 years ago. His smoking use included cigarettes. He started smoking about 61 years ago. He has a 43.00 pack-year smoking history. He has never used smokeless tobacco. He reports that he does not drink alcohol or use drugs.   Family History:  The patient's family history includes Diabetes type I in his sister; Lung cancer in his brother, brother, and brother.    ROS:  Please see the history of present illness.   All other systems are personally reviewed and negative.   Exam:  (Video/Tele Health Call; Exam is subjective and or/visual.) General:  Speaks in full sentences. No resp difficulty. Lungs: Normal respiratory effort with conversation.  Abdomen: Non-distended per patient report Extremities: Pt denies edema. Neuro: Alert & oriented x 3.   Recent Labs: 09/04/2018: Magnesium 2.1 12/06/2018: TSH 1.210 12/17/2018: Hemoglobin 14.4; Platelets 179 02/06/2019: BUN 19; Creatinine, Ser 0.93; Potassium 4.3; Sodium 143  Personally reviewed   Wt Readings from Last 3 Encounters:  02/13/19 59.4 kg (131 lb)  12/27/18 61.8 kg (136 lb 3.2 oz)  12/17/18 61.7 kg (136 lb)  ASSESSMENT AND PLAN:  1. Chronic systolic CHF: Initially, EF low in 20-25% range in 2009.  However, over time, EF improved to 55-60% in 1/17.  Echo in 5/19 with EF 40-45%.  Echo in 10/19 with EF up to 45-50%.  Suspect primarily nonischemic cardiomyopathy as degree of coronary disease does not explain his initial low EF.  Patient has a Medtronic CRT-P device.  NYHA class III symptoms.  He is not volume overloaded on exam and last echo showed improved EF.  I still think that his symptoms are primarily due to severe COPD.  He is now on minimal doses of cardiac meds, but his wife is fairly convinced that his symptoms are coming from his medications.  - His wife wants to try him off bisoprolol, he is currently taking 2.5 mg daily.  He will stop bisoprolol, if he does not feel considerably better off bisoprolol, I asked her to restart it.  - Continue losartan 12.5 mg daily.    - Continue Lasix 60 mg daily, creatinine stable on recent BMET.  - Continue eplerenone 12.5 mg daily.   2. COPD: His COPD appears quite severe.  He is not using inhalers but is now on home oxygen. He has not seen pulmonary recently.  I think that COPD is the major cause of his current dyspnea and fatigue.  - I will refer  him to pulmonary for re-evaluation.  3. CAD: Cath in 2017 with nonobstructive disease, patent RCA stent. No chest pain.  - Continue statin, ASA 81.  4. Aortic stenosis: Mild on last echo.  5. H/o atrial tachycardia: S/p ablation by Dr. Lovena Le, no palpitations.  6. Complete heart block: He has MDT CRT-P device.  7. Pulmonary hypertension: Noted on 2017 RHC, mild and likely group 3 from COPD. No change.  8. VT: Will be stopping bisoprolol. Hopefully will not recur.    COVID screen The patient does not have any symptoms that suggest any further testing/ screening at this time.  Social distancing reinforced today.  Patient Risk: After full review of this patients clinical status, I feel that they are at moderate risk for cardiac decompensation at this time.  Relevant cardiac medications were reviewed at length with the patient today. The patient does not have concerns regarding their medications at this time.   Recommended follow-up:  Has 04/09/19 appt.   Today, I have spent 21 minutes with the patient with telehealth technology discussing the above issues .    Signed, Loralie Champagne, MD  03/19/2019  [place] [address] Heart and [place] [address] [phone] (office) [phone] (fax)

## 2019-03-19 NOTE — Progress Notes (Signed)
Larey Dresser, MD  [name] Calico, RN        1. Had BMET and CBC drawn by Dr. Woody Seller last week, please get a copy.  2. Says they have followup in the office with me on 6/23, please confirm.  3. Stop bisoprolol. If this does not help fatigue, restart it after a week or two.  4. Needs pulmonary appt with Dr. Vaughan Browner or Dr. Chase Caller for severe COPD.    Left message at Dr Woody Seller office to fax [place] labs, ref for pulm placed.  Pt does not have f/u on 6/23 will schedule.  Spoke w/pt's daughter and she is aware of all the above.

## 2019-03-20 ENCOUNTER — Telehealth: Payer: Self-pay | Admitting: Neurology

## 2019-03-20 ENCOUNTER — Ambulatory Visit (INDEPENDENT_AMBULATORY_CARE_PROVIDER_SITE_OTHER): Payer: Medicare Other | Admitting: Neurology

## 2019-03-20 ENCOUNTER — Other Ambulatory Visit: Payer: Self-pay

## 2019-03-20 ENCOUNTER — Ambulatory Visit: Payer: Medicare Other | Admitting: Neurology

## 2019-03-20 ENCOUNTER — Encounter: Payer: Self-pay | Admitting: Neurology

## 2019-03-20 VITALS — BP 155/76 | HR 77 | Temp 99.1°F

## 2019-03-20 DIAGNOSIS — R269 Unspecified abnormalities of gait and mobility: Secondary | ICD-10-CM | POA: Insufficient documentation

## 2019-03-20 DIAGNOSIS — E538 Deficiency of other specified B group vitamins: Secondary | ICD-10-CM

## 2019-03-20 DIAGNOSIS — G3183 Dementia with Lewy bodies: Secondary | ICD-10-CM | POA: Insufficient documentation

## 2019-03-20 DIAGNOSIS — F0391 Unspecified dementia with behavioral disturbance: Secondary | ICD-10-CM | POA: Diagnosis not present

## 2019-03-20 DIAGNOSIS — F0390 Unspecified dementia without behavioral disturbance: Secondary | ICD-10-CM | POA: Insufficient documentation

## 2019-03-20 MED ORDER — DONEPEZIL HCL 10 MG PO TABS: 10.0000 mg | ORAL_TABLET | Freq: Every day | ORAL | 4 refills | Status: DC

## 2019-03-20 MED ORDER — CARBIDOPA-LEVODOPA 25-100 MG PO TABS: 1.0000 | ORAL_TABLET | Freq: Three times a day (TID) | ORAL | 11 refills | Status: DC

## 2019-03-20 MED ORDER — MEMANTINE HCL 10 MG PO TABS: 10.0000 mg | ORAL_TABLET | Freq: Two times a day (BID) | ORAL | 4 refills | Status: DC

## 2019-03-20 NOTE — Progress Notes (Signed)
PATIENT: [name] DOB: [birth year]  Chief Complaint  Patient presents with  . Memory Loss/Unsteadiness    MMSE 17/30 - 8 animals.  He is here with his daughter, [name], for worsening memory loss and confusion.  He has also been more unsteady on this feet.  His bilateral hand tremors are more of a problem now.  He is having difficulty with writing and eating.      HISTORICAL  [name] is a 78 year old male, seen in request by his cardiologist Dr. Lovena Le and his primary care physician Dr. Woody Seller, Rennis Petty for evaluation of altered mental status, initial evaluation was on July 02, 2018.  I have reviewed and summarized the referring note from the referring physician.  He has past medical history of hypertension, hyperlipidemia, atrial tachycardia, status post ablation in January 2019, COPD, congestive heart failure, he also has pacemaker, is not an MRI candidate,  Since his ablation surgery in January 2019, he was noted to have worsening confusion, he complains of feeling fatigue, do not have energy over doing something, also has worsening anxiety, tends to become confused, lost train of thoughts during the middle of the conversation, he also become much less active, always moving slow, now has mild worsening gait abnormality, sometimes he went to the kitchen try to fixing a cup of coffee, but could not remember where he laid his cup, he also has hard of hearing  Personally reviewed CT angiogram of head in February 2019, atherosclerotic calcification, mild stenosis in the carotid siphon bilaterally, and in the distal vertebral artery bilaterally, otherwise no significant intracranial stenosis.  Atrophy and chronic microvascular ischemic change in the white matter.  We reviewed laboratory evaluation in September 2019, normal TSH, free T4, BMP showed no significant abnormality, B12 in February 2019 was mildly decreased 218, he was given vitamin B12 IM supplement, which has helped him  some,  He denies a family history of dementia, he is not taking aspirin daily  UPDATE March 20 2019: He is accompanied by his daughter [name] at today's clinical visit, he sits in wheelchair, his wife is on phone,  He is noted to have increased blurry vision, confusion, gait abnormality,  Daughter also reported that he has REM sleep disorder, acting out of dreams, daytime evaluations, sleep most of the day, nighttime visual hallucinations  We again personally reviewed CT head without contrast February 2019, generalized atrophy especially of the bilateral frontotemporal region, no acute abnormality  Laboratory evaluations in 2020 showed normal CBC, TSH, BMP,   REVIEW OF SYSTEMS: Full 14 system review of systems performed and notable only for as above  ALLERGIES: Allergies  Allergen Reactions  . Spironolactone Other (See Comments)    gynecomastia    HOME MEDICATIONS: Current Outpatient Medications  Medication Sig Dispense Refill  . albuterol (PROVENTIL) (2.5 MG/3ML) 0.083% nebulizer solution Take 2.5 mg by nebulization daily.    Marland Kitchen aspirin EC 81 MG tablet Take 1 tablet (81 mg total) by mouth at bedtime.    . Calcium Carb-Cholecalciferol (CALCIUM 500 + D3 PO) Take 1 tablet by mouth at bedtime.     . Cyanocobalamin (VITAMIN B-12 IJ) Inject 1,000 mcg as directed every 30 (thirty) days.     Marland Kitchen eplerenone (INSPRA) 25 MG tablet Take 1 tablet (25 mg total) by mouth daily. Take at bed time (Patient taking differently: Take 12.5 mg by mouth daily. Take at bed time) 90 tablet 3  . FLUoxetine (PROZAC) 10 MG capsule Take 20 mg by mouth  at bedtime.     . fluticasone (FLONASE) 50 MCG/ACT nasal spray Place 1 spray into both nostrils daily as needed (for sinus issues.).     Marland Kitchen furosemide (LASIX) 20 MG tablet Take 3 tablets (60 mg total) by mouth every morning. 120 tablet 2  . losartan (COZAAR) 25 MG tablet Take 12.5 mg by mouth daily.    . memantine (NAMENDA) 10 MG tablet TAKE 1 TABLET BY MOUTH TWICE A  DAY (Patient taking differently: Take 10 mg by mouth 2 (two) times daily. ) 180 tablet 4  . pantoprazole (PROTONIX) 40 MG tablet Take 1 tablet (40 mg total) by mouth daily. 90 tablet 2  . Polyethyl Glycol-Propyl Glycol (LUBRICANT EYE DROPS) 0.4-0.3 % SOLN Place 1-2 drops into both eyes 2 (two) times daily as needed (for dry/irritated eyes.).    Marland Kitchen potassium chloride SA (K-DUR,KLOR-CON) 20 MEQ tablet Take 1 tablet (20 mEq total) by mouth daily. 90 tablet 3  . rosuvastatin (CRESTOR) 10 MG tablet Take 1 tablet (10 mg total) by mouth daily. (Patient taking differently: Take 10 mg by mouth every evening. ) 30 tablet 6  . SYNTHROID 112 MCG tablet Take 1 tablet by mouth daily.     No current facility-administered medications for this visit.     PAST MEDICAL HISTORY: Past Medical History:  Diagnosis Date  . AICD (automatic cardioverter/defibrillator) present   . Aortic insufficiency     moderate aortic ins moderate/asymptomatic/normal LV cavity size.   . Carotid artery disease ([place])     less than 50% stenosis bilaterally  . Carotid artery disease ([place])   . Chronic systolic CHF (congestive heart failure) ([place])   . Coronary artery disease   . Drug-induced gynecomastia     secondary to spironolactone  . Dyslipidemia   . Hypertension   . ICD (implantable cardiac defibrillator), biventricular, in situ     Medtronic model  [id number]  . Ischemic cardiomyopathy     status post non-ST elevation microinfarction stent to the right coronary artery is a   . LV dysfunction     NYHA class I/prior ejection fraction 20-25% and an ejection fraction 7 2009 50-55%, ejection fraction 60% bedside echocardiogram July 2012   . PONV (postoperative nausea and vomiting)   . Pulmonary embolism ([facility])    Prior history of pulmonary embolism off Coumadin.  . Ventricular tachycardia ([place])    Inducible ventricular polymorphic tachycardia status post ICD followed by upgrade with CRT-D 2007, battery change at 01/31/2011     PAST SURGICAL HISTORY: Past Surgical History:  Procedure Laterality Date  . ABLATION OF DYSRHYTHMIC FOCUS  10/20/2017   atrial tach  . ATRIAL TACH ABLATION N/A 10/20/2017   Procedure: ATRIAL TACH ABLATION;  Surgeon: Evans Lance, MD;  Location: [facility];  Service: Cardiovascular;  Laterality: N/A;  . BIOPSY  12/20/2018   Procedure: BIOPSY;  Surgeon: Rogene Houston, MD;  [facility];  Service: Endoscopy;;  colon  . CARDIAC CATHETERIZATION    . CARDIAC CATHETERIZATION N/A 12/10/2015   Procedure: Right/Left Heart Cath and Coronary Angiography;  Surgeon: Peter M Martinique, MD;  Location: [facility];  Service: Cardiovascular;  Laterality: N/A;  . CARDIAC DEFIBRILLATOR PLACEMENT     medtronic, remote - yes  . CATARACT EXTRACTION W/PHACO Left 08/25/2014   Procedure: CATARACT EXTRACTION PHACO AND INTRAOCULAR LENS PLACEMENT (IOC);  Surgeon: Tonny Branch, MD;  Location: [facility];  Service: Ophthalmology;  Laterality: Left;  CDE 5.79  . CATARACT EXTRACTION W/PHACO  Right 09/18/2014   Procedure: CATARACT EXTRACTION PHACO AND INTRAOCULAR LENS PLACEMENT RIGHT EYE;  Surgeon: Tonny Branch, MD;  Location: [facility];  Service: Ophthalmology;  Laterality: Right;  CDE:3.35  . COLONOSCOPY N/A 12/20/2018   Procedure: COLONOSCOPY;  Surgeon: Rogene Houston, MD;  [facility];  Service: Endoscopy;  Laterality: N/A;  730  . CORONARY STENT PLACEMENT    . EP IMPLANTABLE DEVICE N/A 05/03/2016   Procedure: BIV PPM Generator Changeout;  Surgeon: Evans Lance, MD;  Location: [facility];  Service: Cardiovascular;  Laterality: N/A;  . ESOPHAGOSCOPY WITH DILITATION  12/20/2018   Procedure: ESOPHAGOSCOPY WITH DILITATION;  Surgeon: Rogene Houston, MD;  [facility];  Service: Endoscopy;;  . INGUINAL HERNIA REPAIR    . left breast biopsy    . POLYPECTOMY  12/20/2018   Procedure: POLYPECTOMY;  Surgeon: Rogene Houston, MD;  [facility];  Service: Endoscopy;;  colon  .  RETROPUBIC PROSTATECTOMY    . THORACOTOMY     left  . THYROID SURGERY    . THYROIDECTOMY      FAMILY HISTORY: Family History  Problem Relation Age of Onset  . Diabetes type I Sister        daughter   . Lung cancer Brother        several types of cancer  . Lung cancer Brother        several types of cancer  . Lung cancer Brother   . Clotting disorder Neg Hx        also no repiratory disease   . Prostate cancer Neg Hx     SOCIAL HISTORY: Social History   Socioeconomic History  . Marital status: Married    Spouse name: Not on file  . Number of children: 2  . Years of education: 10  . Highest education level: Not on file  Occupational History  . Occupation: Development worker, community - self employed    Employer: RETIRED  Social Needs  . Financial resource strain: Not hard at all  . Food insecurity:    Worry: Never true    Inability: Never true  . Transportation needs:    Medical: No    Non-medical: No  Tobacco Use  . Smoking status: Former Smoker    Packs/day: 1.00    Years: 43.00    Pack years: 43.00    Types: Cigarettes    Start date: 10/17/1957    Last attempt to quit: 12/13/2000    Years since quitting: 18.2  . Smokeless tobacco: Never Used  . Tobacco comment: started at age 35  Substance and Sexual Activity  . Alcohol use: No    Alcohol/week: 0.0 standard drinks  . Drug use: No  . Sexual activity: Not Currently  Lifestyle  . Physical activity:    Days per week: Not on file    Minutes per session: Not on file  . Stress: Not on file  Relationships  . Social connections:    Talks on phone: Not on file    Gets together: Not on file    Attends religious service: Not on file    Active member of club or organization: Not on file    Attends meetings of clubs or organizations: Not on file    Relationship status: Not on file  . Intimate partner violence:    Fear of current or ex partner: Not on file    Emotionally abused: Not on file    Physically abused: Not  on file     Forced sexual activity: Not on file  Other Topics Concern  . Not on file  Social History Narrative   Self employed Development worker, community; 2 daughters.      PHYSICAL EXAM   Vitals:   03/20/19 1243  BP: (!) 155/76  Pulse: 77  Temp: 99.1 F (37.3 C)    Not recorded      There is no height or weight on file to calculate BMI.  PHYSICAL EXAMNIATION:  Gen: NAD, conversant, well nourised, obese, well groomed                     Cardiovascular: Regular rate rhythm, no peripheral edema, warm, nontender. Eyes: Conjunctivae clear without exudates or hemorrhage Neck: Supple, no carotid bruits. Pulmonary: Clear to auscultation bilaterally   NEUROLOGICAL EXAM:  MENTAL STATUS: MMSE - Mini Mental State Exam 03/20/2019 07/02/2018  Orientation to time 1 3  Orientation to Place 5 2  Registration 3 3  Attention/ Calculation 0 0  Attention/Calculation-comments - Refused to spell world backwards  Recall 1 3  Language- name 2 objects 2 2  Language- repeat 1 1  Language- follow 3 step command 3 2  Language- read & follow direction 1 1  Write a sentence 0 1  Copy design 0 1  Total score 17 19     CRANIAL NERVES: CN II: Visual fields are full to confrontation.  Pupils are round equal and briskly reactive to light. CN III, IV, VI: extraocular movement are normal. No ptosis. CN V: Facial sensation is intact to pinprick in all 3 divisions bilaterally. Corneal responses are intact.  CN VII: Face is symmetric with normal eye closure and smile. CN VIII: Hearing is normal to rubbing fingers CN IX, X: Palate elevates symmetrically. Phonation is normal. CN XI: Head turning and shoulder shrug are intact CN XII: Tongue is midline with normal movements and no atrophy.  MOTOR: He has mild bilateral upper extremity resting tremor, mild bilateral upper extremity and nuchal rigidity, no significant weakness bradykinesia upon rapid wrist opening and closure, finger tapping, foot tapping  REFLEXES: Reflexes are 2+  and symmetric at the biceps, triceps, knees, and ankles. Plantar responses are flexor.  SENSORY: Intact to light touch, pinprick, positional sensation and vibratory sensation are intact in fingers and toes.  COORDINATION: Rapid alternating movements and fine finger movements are intact. There is no dysmetria on finger-to-nose and heel-knee-shin.    GAIT/STANCE: He needs pushed up to get up from seated position, unsteady, leaning backwards,   DIAGNOSTIC DATA (LABS, IMAGING, TESTING) - I reviewed patient records, labs, notes, testing and imaging myself where available.   ASSESSMENT AND PLAN  [name] is a 78 y.o. male   Mild cognitive impairment  Mini-Mental status examination is 55 out of 30  He also has mild parkinsonian features,  Likely central nervous system degenerative disorder, his parkinsonian features, REM sleep disorder, daytime variation suggestive of Lewy body dementia  continue moderate exercise,  Optimize vascular risk factor control  Namenda 10 mg twice a day add on Aricept 10 mg daily  Sinemet 25/100 mg 3 times a day  Refer him to physical therapy   Marcial Pacas, M.D. Ph.D.  [facility] [address] [place], [place] [address] Ph: [phone] Fax: [phone]  CC: Glenda Chroman, MD

## 2019-03-20 NOTE — Telephone Encounter (Signed)
[employer] medicare order sent to GI. No auth they will reach out to the patient to schedule.  

## 2019-03-21 ENCOUNTER — Other Ambulatory Visit (HOSPITAL_COMMUNITY): Payer: Self-pay | Admitting: Cardiology

## 2019-03-24 LAB — METHYLMALONIC ACID, SERUM: Methylmalonic Acid: 194 nmol/L (ref 0–378)

## 2019-03-24 LAB — FOLATE: Folate: >20 ng/mL (ref >3.0)

## 2019-03-24 LAB — VITAMIN B12: Vitamin B-12: 1171 pg/mL (ref 232–1245)

## 2019-03-29 ENCOUNTER — Ambulatory Visit: Payer: Medicare Other | Admitting: *Deleted

## 2019-03-29 DIAGNOSIS — I442 Atrioventricular block, complete: Secondary | ICD-10-CM

## 2019-04-01 LAB — CUP PACEART REMOTE DEVICE CHECK
Battery Remaining Longevity: 30 mo
Battery Voltage: 2.96 V
Brady Statistic AP VP Percent: 70.53 %
Brady Statistic AP VS Percent: 0.01 %
Brady Statistic AS VP Percent: 29.4 %
Brady Statistic AS VS Percent: 0.06 %
Brady Statistic RA Percent Paced: 70.22 %
Brady Statistic RV Percent Paced: 99.53 %
Date Time Interrogation Session: 20200613223332
Implantable Lead Implant Date: 20050810
Implantable Lead Implant Date: 20050810
Implantable Lead Implant Date: 20071015
Implantable Lead Location: 753858
Implantable Lead Location: 753859
Implantable Lead Location: 753860
Implantable Lead Model: 4194
Implantable Lead Model: 5076
Implantable Lead Model: 6949
Implantable Pulse Generator Implant Date: 20170718
Lead Channel Impedance Value: 228 Ohm
Lead Channel Impedance Value: 380 Ohm
Lead Channel Impedance Value: 399 Ohm
Lead Channel Impedance Value: 437 Ohm
Lead Channel Impedance Value: 437 Ohm
Lead Channel Impedance Value: 494 Ohm
Lead Channel Impedance Value: 513 Ohm
Lead Channel Impedance Value: 513 Ohm
Lead Channel Impedance Value: 570 Ohm
Lead Channel Pacing Threshold Amplitude: 0.625 V
Lead Channel Pacing Threshold Amplitude: 1.25 V
Lead Channel Pacing Threshold Pulse Width: 0.4 ms
Lead Channel Pacing Threshold Pulse Width: 0.4 ms
Lead Channel Sensing Intrinsic Amplitude: 14.25 mV
Lead Channel Sensing Intrinsic Amplitude: 14.25 mV
Lead Channel Sensing Intrinsic Amplitude: 4.125 mV
Lead Channel Sensing Intrinsic Amplitude: 4.125 mV
Lead Channel Setting Pacing Amplitude: 1.5 V
Lead Channel Setting Pacing Amplitude: 3 V
Lead Channel Setting Pacing Amplitude: 3.75 V
Lead Channel Setting Pacing Pulse Width: 0.6 ms
Lead Channel Setting Pacing Pulse Width: 1 ms
Lead Channel Setting Sensing Sensitivity: 2.8 mV

## 2019-04-01 NOTE — Progress Notes (Signed)
Remote pacemaker transmission.   

## 2019-04-08 ENCOUNTER — Ambulatory Visit (INDEPENDENT_AMBULATORY_CARE_PROVIDER_SITE_OTHER): Payer: Medicare Other | Admitting: *Deleted

## 2019-04-08 DIAGNOSIS — I472 Ventricular tachycardia, unspecified: Secondary | ICD-10-CM

## 2019-04-08 DIAGNOSIS — I5022 Chronic systolic (congestive) heart failure: Secondary | ICD-10-CM

## 2019-04-09 ENCOUNTER — Other Ambulatory Visit: Payer: Self-pay

## 2019-04-09 ENCOUNTER — Ambulatory Visit
Admission: RE | Admit: 2019-04-09 | Discharge: 2019-04-09 | Disposition: A | Discharge status: Home / Self Care | Payer: Medicare Other | Source: Ambulatory Visit | Attending: Neurology | Admitting: Neurology

## 2019-04-09 ENCOUNTER — Telehealth: Payer: Self-pay | Admitting: Neurology

## 2019-04-09 ENCOUNTER — Encounter (HOSPITAL_COMMUNITY): Payer: Medicare Other | Admitting: Cardiology

## 2019-04-09 DIAGNOSIS — G3183 Dementia with Lewy bodies: Secondary | ICD-10-CM

## 2019-04-09 LAB — CUP PACEART REMOTE DEVICE CHECK
Battery Remaining Longevity: 27 mo
Battery Voltage: 2.96 V
Brady Statistic AP VP Percent: 98.65 %
Brady Statistic AP VS Percent: 0 %
Brady Statistic AS VP Percent: 1.35 %
Brady Statistic AS VS Percent: 0 %
Brady Statistic RA Percent Paced: 98.55 %
Brady Statistic RV Percent Paced: 99.86 %
Date Time Interrogation Session: 20200622170727
Implantable Lead Implant Date: 20050810
Implantable Lead Implant Date: 20050810
Implantable Lead Implant Date: 20071015
Implantable Lead Location: 753858
Implantable Lead Location: 753859
Implantable Lead Location: 753860
Implantable Lead Model: 4194
Implantable Lead Model: 5076
Implantable Lead Model: 6949
Implantable Pulse Generator Implant Date: 20170718
Lead Channel Impedance Value: 247 Ohm
Lead Channel Impedance Value: 399 Ohm
Lead Channel Impedance Value: 399 Ohm
Lead Channel Impedance Value: 437 Ohm
Lead Channel Impedance Value: 456 Ohm
Lead Channel Impedance Value: 494 Ohm
Lead Channel Impedance Value: 513 Ohm
Lead Channel Impedance Value: 513 Ohm
Lead Channel Impedance Value: 608 Ohm
Lead Channel Pacing Threshold Amplitude: 0.625 V
Lead Channel Pacing Threshold Amplitude: 1.25 V
Lead Channel Pacing Threshold Pulse Width: 0.4 ms
Lead Channel Pacing Threshold Pulse Width: 0.4 ms
Lead Channel Sensing Intrinsic Amplitude: 14.25 mV
Lead Channel Sensing Intrinsic Amplitude: 14.25 mV
Lead Channel Sensing Intrinsic Amplitude: 3.875 mV
Lead Channel Sensing Intrinsic Amplitude: 3.875 mV
Lead Channel Setting Pacing Amplitude: 1.5 V
Lead Channel Setting Pacing Amplitude: 3 V
Lead Channel Setting Pacing Amplitude: 3.75 V
Lead Channel Setting Pacing Pulse Width: 0.6 ms
Lead Channel Setting Pacing Pulse Width: 1 ms
Lead Channel Setting Sensing Sensitivity: 2.8 mV

## 2019-04-09 NOTE — Telephone Encounter (Signed)
Please call patient, CT head showed evidence of generalized atrophy, will review films at his next follow up visit.  IMPRESSION: Atrophy, somewhat more severe in the frontal and parietal lobes than elsewhere. There is patchy periventricular small vessel disease. No acute infarct. No mass or hemorrhage.  There are foci of arterial vascular calcification. There is mucosal thickening in several ethmoid air cells as well as opacification in several mastoid air cells bilaterally.

## 2019-04-09 NOTE — Telephone Encounter (Signed)
I have spoke with his daughter and she verbalized understanding of the CT results.  She will discuss this with her parents.  Reports that his walking and mobility have improved since he started on Sinemet.  He did have vomiting with donepezil 10mg .  She is going to try to get him to take 5mg  for a couple of weeks then attempt the increase to 10mg  again.  She will call back if the GI upset continues to be a problem.

## 2019-04-09 NOTE — Telephone Encounter (Signed)
Left message requesting his daughter, [name] on [place], to return my call.

## 2019-04-15 NOTE — Progress Notes (Signed)
Remote pacemaker transmission.   

## 2019-04-16 ENCOUNTER — Other Ambulatory Visit: Payer: Self-pay

## 2019-04-16 ENCOUNTER — Ambulatory Visit (HOSPITAL_COMMUNITY)
Admission: RE | Admit: 2019-04-16 | Discharge: 2019-04-16 | Disposition: A | Discharge status: Home / Self Care | Payer: Medicare Other | Source: Ambulatory Visit | Attending: Cardiology | Admitting: Cardiology

## 2019-04-16 ENCOUNTER — Encounter (HOSPITAL_COMMUNITY): Payer: Self-pay | Admitting: Cardiology

## 2019-04-16 VITALS — BP 138/74 | HR 71 | Wt 137.0 lb

## 2019-04-16 DIAGNOSIS — I110 Hypertensive heart disease with heart failure: Secondary | ICD-10-CM | POA: Insufficient documentation

## 2019-04-16 DIAGNOSIS — F0280 Dementia in other diseases classified elsewhere without behavioral disturbance: Secondary | ICD-10-CM | POA: Insufficient documentation

## 2019-04-16 DIAGNOSIS — I509 Heart failure, unspecified: Secondary | ICD-10-CM | POA: Diagnosis present

## 2019-04-16 DIAGNOSIS — Z86711 Personal history of pulmonary embolism: Secondary | ICD-10-CM | POA: Insufficient documentation

## 2019-04-16 DIAGNOSIS — G3183 Dementia with Lewy bodies: Secondary | ICD-10-CM | POA: Diagnosis not present

## 2019-04-16 DIAGNOSIS — I471 Supraventricular tachycardia: Secondary | ICD-10-CM | POA: Insufficient documentation

## 2019-04-16 DIAGNOSIS — I350 Nonrheumatic aortic (valve) stenosis: Secondary | ICD-10-CM | POA: Insufficient documentation

## 2019-04-16 DIAGNOSIS — Z87891 Personal history of nicotine dependence: Secondary | ICD-10-CM | POA: Diagnosis not present

## 2019-04-16 DIAGNOSIS — E039 Hypothyroidism, unspecified: Secondary | ICD-10-CM | POA: Diagnosis not present

## 2019-04-16 DIAGNOSIS — Z955 Presence of coronary angioplasty implant and graft: Secondary | ICD-10-CM | POA: Diagnosis not present

## 2019-04-16 DIAGNOSIS — Z95810 Presence of automatic (implantable) cardiac defibrillator: Secondary | ICD-10-CM | POA: Insufficient documentation

## 2019-04-16 DIAGNOSIS — I5022 Chronic systolic (congestive) heart failure: Secondary | ICD-10-CM | POA: Insufficient documentation

## 2019-04-16 DIAGNOSIS — I272 Pulmonary hypertension, unspecified: Secondary | ICD-10-CM | POA: Diagnosis not present

## 2019-04-16 DIAGNOSIS — E785 Hyperlipidemia, unspecified: Secondary | ICD-10-CM | POA: Insufficient documentation

## 2019-04-16 DIAGNOSIS — I442 Atrioventricular block, complete: Secondary | ICD-10-CM | POA: Diagnosis not present

## 2019-04-16 DIAGNOSIS — Z79899 Other long term (current) drug therapy: Secondary | ICD-10-CM | POA: Insufficient documentation

## 2019-04-16 DIAGNOSIS — J449 Chronic obstructive pulmonary disease, unspecified: Secondary | ICD-10-CM

## 2019-04-16 DIAGNOSIS — I2510 Atherosclerotic heart disease of native coronary artery without angina pectoris: Secondary | ICD-10-CM | POA: Insufficient documentation

## 2019-04-16 DIAGNOSIS — Z7982 Long term (current) use of aspirin: Secondary | ICD-10-CM | POA: Diagnosis not present

## 2019-04-16 NOTE — Patient Instructions (Addendum)
Labs done today. We will call only if labs are abnormal.   You have been referred to Pulmonary. That office will contact you about scheduling an appointment.   Your physician recommends that you schedule a follow-up appointment in: 4 months  At the [facility], you and your health needs are our priority. As part of our continuing mission to provide you with exceptional heart care, we have created designated Provider Care Teams. These Care Teams include your primary Cardiologist (physician) and Advanced Practice Providers (APPs- Physician Assistants and Nurse Practitioners) who all work together to provide you with the care you need, when you need it.   You may see any of the following providers on your designated Care Team at your next follow up: Marland Kitchen Dr Glori Bickers . Dr Loralie Champagne . Darrick Grinder, NP

## 2019-04-17 LAB — BASIC METABOLIC PANEL
Anion gap: 9 (ref 5–15)
BUN: 19 mg/dL (ref 8–23)
CO2: 30 mmol/L (ref 22–32)
Calcium: 9.4 mg/dL (ref 8.9–10.3)
Chloride: 100 mmol/L (ref 98–111)
Creatinine, Ser: 1.05 mg/dL (ref 0.61–1.24)
GFR calc Af Amer: >60 mL/min (ref >60)
GFR calc non Af Amer: >60 mL/min (ref >60)
Glucose, Bld: 78 mg/dL (ref 70–99)
Potassium: 4.1 mmol/L (ref 3.5–5.1)
Sodium: 139 mmol/L (ref 135–145)

## 2019-04-17 NOTE — Progress Notes (Signed)
Date:  04/17/2019   ID:  [name], DOB 1941-01-17, MRN [id number]   Provider location: [facility] Type of Visit: Established patient   PCP:  Glenda Chroman, MD  Cardiologist: Dr. Aundra Dubin  Chief Complaint: Shortness of breath/fatigue   History of Present Illness: [name] is a 78 y.o. male who was referred by Dr. Lovena Le for evaluation of CHF. Patient has a long cardiac history.  He had a prior PCI to the RCA with BMS.  Most recent cath in 2/17 showed nonobstructive CAD, patent RCA stent.  In 2009, echo showed EF 20-25%.  He went on to have ICD placed and later, CRT-P when he developed complete heart block requiring CRT upgrade (no longer has defibrillator). Echo in 1/17 showed EF improved to 55-60%. Echo in 5/19 showed EF 40-45%, moderate LVH, mild-moderate AI, normal RV.    Patient has been seen by pulmonary but not recently.  He does not appear to have been very compliant with inhalers.  He was started on Duonebs by Dr. Lenna Gilford but stopped it because it was "messing up my eyes."  He is currently on no regular COPD meds.    Echo in 10/19 showed EF up to 45-50% with mild AS.   He was diagnosed by neurology with Lewy body dementia.  He is now on Sinemet, Namenda, and Aricept.   Patient returns for followup of CHF and COPD. He is back on bisoprolol 2.5 mg daily as stopping it did not help his symptoms.  He remains short of breath walking 50-100 feet.  No orthopnea/PND.  No chest pain.  +Fatigue.  Using home oxygen.   Labs (1/19): LDL 80, HDL 42 Labs (4/19): K 3.7, creatinine 1.21, TSH normal Labs (5/19): K 4.3, creatinine 0.97 Labs (9/19): K 3.7, creatinine 1.26 Labs (10/19): K 4.1, creatinine 1.03, hgb 14.7, TSH 7.4, free T4 normal.  Labs (11/19): K 4, creatinine 1.06 Labs (4/20): K 4.3, creatinine 0.93 Labs (5/20): K 4, creatinine 1.03, hgb 14.7  PMH: 1. Aortic insufficiency: Mild to moderate on most recent echo in 1/17.  2. HTN 3. B12  deficiency 4. Atrial tachycardia: S/p ablation in 1/19.  5. Hypothyroidism 6. Hyperlipidemia 7. Prior PE: Not currently on anticoagulation.  8. H/o VT: had CRT-D device, CRT-P since most recent generator change. .  9. CAD: h/o BMS to RCA.  - LHC (2/17) with mild nonobstructive CAD, patent RCA stent.  10. Complete heart block: Has Medtronic CRT-P device.   11. Chronic systolic CHF: Probably primarily nonischemic CMP as he does not have extensive enough CAD to explain CMP.  - Echo (2009): EF 20-25%.  - Echo (1/17): EF 55-60%, mild LV dilation, mild-moderate AI, normal RV size and systolic function.  - RHC (2/17): mean RA 10, PA 44/10, mean PCWP 12, CI 2.18.  - Echo (5/19): EF 40-45%, moderate LVH, mild-moderate AI, normal RV size and systolic function.  - Gynecomastia with spironolactone.  - Echo (10/19): EF 45-50%, mild AS.  12. COPD: Severe emphysema on prior chest CT.  Smoked in past.  - PFTs (12/17) with severe obstruction.  - He is on home oxygen.  13. Esophageal stricture s/p dilation in 3/20.  14. Lewy body dementia  Current Outpatient Medications  Medication Sig Dispense Refill  . albuterol (PROVENTIL) (2.5 MG/3ML) 0.083% nebulizer solution Take 2.5 mg by nebulization daily.    Marland Kitchen aspirin EC 81 MG tablet Take 1 tablet (81 mg total) by mouth at bedtime.    Marland Kitchen  Calcium Carb-Cholecalciferol (CALCIUM 500 + D3 PO) Take 1 tablet by mouth at bedtime.     . carbidopa-levodopa (SINEMET IR) 25-100 MG tablet Take 1 tablet by mouth 3 (three) times daily. 90 tablet 11  . Cyanocobalamin (VITAMIN B-12 IJ) Inject 1,000 mcg as directed every 30 (thirty) days.     Marland Kitchen donepezil (ARICEPT) 10 MG tablet Take 1 tablet (10 mg total) by mouth at bedtime. 90 tablet 4  . eplerenone (INSPRA) 25 MG tablet Take 1 tablet (25 mg total) by mouth daily. Take at bed time (Patient taking differently: Take 12.5 mg by mouth daily. Take at bed time) 90 tablet 3  . FLUoxetine (PROZAC) 10 MG capsule Take 20 mg by mouth at  bedtime.     . fluticasone (FLONASE) 50 MCG/ACT nasal spray Place 1 spray into both nostrils daily as needed (for sinus issues.).     Marland Kitchen furosemide (LASIX) 20 MG tablet Take 3 tablets (60 mg total) by mouth every morning. 120 tablet 2  . losartan (COZAAR) 25 MG tablet Take 12.5 mg by mouth daily.    . memantine (NAMENDA) 10 MG tablet Take 1 tablet (10 mg total) by mouth 2 (two) times daily. 180 tablet 4  . pantoprazole (PROTONIX) 40 MG tablet Take 1 tablet (40 mg total) by mouth daily. 90 tablet 2  . Polyethyl Glycol-Propyl Glycol (LUBRICANT EYE DROPS) 0.4-0.3 % SOLN Place 1-2 drops into both eyes 2 (two) times daily as needed (for dry/irritated eyes.).    Marland Kitchen potassium chloride SA (K-DUR,KLOR-CON) 20 MEQ tablet Take 1 tablet (20 mEq total) by mouth daily. 90 tablet 3  . rosuvastatin (CRESTOR) 10 MG tablet Take 1 tablet (10 mg total) by mouth daily. (Patient taking differently: Take 10 mg by mouth every evening. ) 30 tablet 6  . SYNTHROID 112 MCG tablet Take 1 tablet by mouth daily.    . bisoprolol (ZEBETA) 5 MG tablet Take 0.5 tablets (2.5 mg total) by mouth daily. 30 tablet 0   No current facility-administered medications for this encounter.     Allergies:   Spironolactone   Social History:  The patient  reports that he quit smoking about 18 years ago. His smoking use included cigarettes. He started smoking about 61 years ago. He has a 43.00 pack-year smoking history. He has never used smokeless tobacco. He reports that he does not drink alcohol or use drugs.   Family History:  The patient's family history includes Diabetes type I in his sister; Lung cancer in his brother, brother, and brother.   ROS:  Please see the history of present illness.   All other systems are personally reviewed and negative.   Exam:   BP 138/74   Pulse 71   Wt 62.1 kg (137 lb)   SpO2 92%   BMI 20.83 kg/m  General: NAD Neck: No JVD, no thyromegaly or thyroid nodule.  Lungs: Distant breath sounds.  CV:  Nondisplaced PMI.  Heart regular S1/S2, no S3/S4, no murmur.  No peripheral edema.  No carotid bruit.  Normal pedal pulses.  Abdomen: Soft, nontender, no hepatosplenomegaly, no distention.  Skin: Intact without lesions or rashes.  Neurologic: Alert and oriented x 3.  Psych: Normal affect. Extremities: No clubbing or cyanosis.  HEENT: Normal.    Recent Labs: 09/04/2018: Magnesium 2.1 12/06/2018: TSH 1.210 12/17/2018: Hemoglobin 14.4; Platelets 179 04/16/2019: BUN 19; Creatinine, Ser 1.05; Potassium 4.1; Sodium 139  Personally reviewed   Wt Readings from Last 3 Encounters:  04/16/19 62.1 kg (137 lb)  02/13/19 59.4 kg (131 lb)  12/27/18 61.8 kg (136 lb 3.2 oz)      ASSESSMENT AND PLAN:  1. Chronic systolic CHF: Initially, EF low in 20-25% range in 2009.  However, over time, EF improved to 55-60% in 1/17.  Echo in 5/19 with EF 40-45%.  Echo in 10/19 with EF up to 45-50%.  Suspect primarily nonischemic cardiomyopathy as degree of coronary disease does not explain his initial low EF.  Patient has a Medtronic CRT-P device.  NYHA class III symptoms.  He is not volume overloaded on exam and last echo showed improved EF.  I still think that his symptoms are primarily due to severe COPD.   - Continue bisoprolol.  Stopping this did not help his dyspnea. - Continue losartan 12.5 mg daily.    - Continue Lasix 60 mg daily. BMET today.  - Continue eplerenone 12.5 mg daily.   2. COPD: His COPD appears quite severe.  He is not using inhalers but is now on home oxygen. He has not seen pulmonary recently.  I think that COPD is the major cause of his current dyspnea and fatigue.  - I will make pulmonary referral for him.  3. CAD: Cath in 2017 with nonobstructive disease, patent RCA stent. No chest pain.  - Continue statin, ASA 81.  4. Aortic stenosis: Mild on last echo.  5. H/o atrial tachycardia: S/p ablation by Dr. Lovena Le, no palpitations.  6. Complete heart block: He has MDT CRT-P device.  7.  Pulmonary hypertension: Noted on 2017 RHC, mild and likely group 3 from COPD. No change.  8. VT: He is back on bisoprolol.    Signed, Loralie Champagne, MD  04/17/2019  [facility] [address] [facility] [place] [address] [phone] (office) [phone] (fax)

## 2019-05-10 ENCOUNTER — Telehealth (HOSPITAL_COMMUNITY): Payer: Self-pay | Admitting: Licensed Clinical Social Worker

## 2019-05-10 NOTE — Telephone Encounter (Signed)
Pt wife called CSW to request help ordering patient's Inspra from [employer].  CSW called [employer] to order medication (has to be ordered by physician office)  Pt ID#: [id number] Order#: [id number]  Order should arrive to the clinic within 7-10 business days  CSW will continue to follow and assist as needed  [name], LCSW Clinical Social Worker [facility] Desk#: [phone] Cell#: [phone]

## 2019-05-14 ENCOUNTER — Encounter (HOSPITAL_COMMUNITY): Payer: Medicare Other | Admitting: Cardiology

## 2019-05-21 ENCOUNTER — Telehealth (HOSPITAL_COMMUNITY): Payer: Self-pay

## 2019-05-21 NOTE — Telephone Encounter (Signed)
Called patient to inform him that his medication, INSPRA, was sent to the office through the patient assistance foundation, and they could pick it up at the front desk.

## 2019-05-29 ENCOUNTER — Other Ambulatory Visit: Payer: Self-pay

## 2019-05-29 ENCOUNTER — Other Ambulatory Visit (HOSPITAL_COMMUNITY): Payer: Self-pay

## 2019-05-29 ENCOUNTER — Ambulatory Visit: Payer: Medicare Other | Admitting: Pulmonary Disease

## 2019-05-29 ENCOUNTER — Encounter: Payer: Self-pay | Admitting: Pulmonary Disease

## 2019-05-29 VITALS — BP 118/78 | HR 60 | Temp 98.1°F | Ht 69.0 in | Wt 139.4 lb

## 2019-05-29 DIAGNOSIS — J432 Centrilobular emphysema: Secondary | ICD-10-CM

## 2019-05-29 DIAGNOSIS — I5022 Chronic systolic (congestive) heart failure: Secondary | ICD-10-CM | POA: Diagnosis not present

## 2019-05-29 DIAGNOSIS — J189 Pneumonia, unspecified organism: Secondary | ICD-10-CM

## 2019-05-29 DIAGNOSIS — K222 Esophageal obstruction: Secondary | ICD-10-CM

## 2019-05-29 DIAGNOSIS — R1310 Dysphagia, unspecified: Secondary | ICD-10-CM

## 2019-05-29 DIAGNOSIS — I255 Ischemic cardiomyopathy: Secondary | ICD-10-CM

## 2019-05-29 DIAGNOSIS — G3183 Dementia with Lewy bodies: Secondary | ICD-10-CM

## 2019-05-29 DIAGNOSIS — F0280 Dementia in other diseases classified elsewhere without behavioral disturbance: Secondary | ICD-10-CM

## 2019-05-29 DIAGNOSIS — J449 Chronic obstructive pulmonary disease, unspecified: Secondary | ICD-10-CM

## 2019-05-29 NOTE — Progress Notes (Signed)
Synopsis: Referred in August 2020 for former patient of Dr. Lenna Gilford. PCP: Glenda Chroman, MD  Subjective:   PATIENT ID: [name] GENDER: male DOB: [birth year], MRN: [id number]  Chief Complaint  Patient presents with  . New Patient (Initial Visit)    Former Programmer, systems patient. He reports his breathing has been good. He was placed on oxygen by his PCP a couple months ago. He reports he does not noice a difference with his oxygen and does not think he needs it. Currenlty using Symbicort daily and albuterol nebs prn.      PMH of ischemic heart disease, chronic systolic heart failure, thyroid cancer, s/p surgery and radioablative procedure. Recent episode of pneumonia seen by PCP and treated with outpatient abx. Just started on levaquin and still has once dose left.  At this point his sputum is improving.  He still does have thick discoloration.  His fevers have resolved.  He did have right-sided chest pain but this is also resolved.  He had an episode of choking on food this past spring time.  He has had an upper endoscopy with a stretch of an esophageal stricture.  He does have a history of thyroid cancer with surgery in the past.  He has had trouble swallowing before.  He gets choked easily per his wife.  They have had discussions prior with their primary care doctor regarding goals of care.  I think this is pertinent in the fact that he has 3 chronic diseases dementia, chronic systolic heart failure and very severe COPD.    Past Medical History:  Diagnosis Date  . AICD (automatic cardioverter/defibrillator) present   . Aortic insufficiency     moderate aortic ins moderate/asymptomatic/normal LV cavity size.   . Carotid artery disease ([place])     less than 50% stenosis bilaterally  . Carotid artery disease ([place])   . Chronic systolic CHF (congestive heart failure) ([place])   . Coronary artery disease   . Drug-induced gynecomastia     secondary to spironolactone  . Dyslipidemia   . Hypertension    . ICD (implantable cardiac defibrillator), biventricular, in situ     Medtronic model  [id number]  . Ischemic cardiomyopathy     status post non-ST elevation microinfarction stent to the right coronary artery is a   . LV dysfunction     NYHA class I/prior ejection fraction 20-25% and an ejection fraction 7 2009 50-55%, ejection fraction 60% bedside echocardiogram July 2012   . PONV (postoperative nausea and vomiting)   . Pulmonary embolism (HCC)    Prior history of pulmonary embolism off Coumadin.  . Ventricular tachycardia ([place])    Inducible ventricular polymorphic tachycardia status post ICD followed by upgrade with CRT-D 2007, battery change at 01/31/2011     Family History  Problem Relation Age of Onset  . Diabetes type I Sister        daughter   . Lung cancer Brother        several types of cancer  . Lung cancer Brother        several types of cancer  . Lung cancer Brother   . Clotting disorder Neg Hx        also no repiratory disease   . Prostate cancer Neg Hx      Past Surgical History:  Procedure Laterality Date  . ABLATION OF DYSRHYTHMIC FOCUS  10/20/2017   atrial tach  . ATRIAL TACH ABLATION N/A 10/20/2017   Procedure: ATRIAL TACH ABLATION;  Surgeon:  Evans Lance, MD;  Location: [facility];  Service: Cardiovascular;  Laterality: N/A;  . BIOPSY  12/20/2018   Procedure: BIOPSY;  Surgeon: Rogene Houston, MD;  [facility];  Service: Endoscopy;;  colon  . CARDIAC CATHETERIZATION    . CARDIAC CATHETERIZATION N/A 12/10/2015   Procedure: Right/Left Heart Cath and Coronary Angiography;  Surgeon: Peter M Martinique, MD;  Location: [facility];  Service: Cardiovascular;  Laterality: N/A;  . CARDIAC DEFIBRILLATOR PLACEMENT     medtronic, remote - yes  . CATARACT EXTRACTION W/PHACO Left 08/25/2014   Procedure: CATARACT EXTRACTION PHACO AND INTRAOCULAR LENS PLACEMENT (IOC);  Surgeon: Tonny Branch, MD;  Location: [facility];  Service: Ophthalmology;  Laterality:  Left;  CDE 5.79  . CATARACT EXTRACTION W/PHACO Right 09/18/2014   Procedure: CATARACT EXTRACTION PHACO AND INTRAOCULAR LENS PLACEMENT RIGHT EYE;  Surgeon: Tonny Branch, MD;  Location: [facility];  Service: Ophthalmology;  Laterality: Right;  CDE:3.35  . COLONOSCOPY N/A 12/20/2018   Procedure: COLONOSCOPY;  Surgeon: Rogene Houston, MD;  [facility];  Service: Endoscopy;  Laterality: N/A;  730  . CORONARY STENT PLACEMENT    . EP IMPLANTABLE DEVICE N/A 05/03/2016   Procedure: BIV PPM Generator Changeout;  Surgeon: Evans Lance, MD;  Location: [facility];  Service: Cardiovascular;  Laterality: N/A;  . ESOPHAGOSCOPY WITH DILITATION  12/20/2018   Procedure: ESOPHAGOSCOPY WITH DILITATION;  Surgeon: Rogene Houston, MD;  [facility];  Service: Endoscopy;;  . INGUINAL HERNIA REPAIR    . left breast biopsy    . POLYPECTOMY  12/20/2018   Procedure: POLYPECTOMY;  Surgeon: Rogene Houston, MD;  [facility];  Service: Endoscopy;;  colon  . RETROPUBIC PROSTATECTOMY    . THORACOTOMY     left  . THYROID SURGERY    . THYROIDECTOMY      Social History   Socioeconomic History  . Marital status: Married    Spouse name: Not on file  . Number of children: 2  . Years of education: 10  . Highest education level: Not on file  Occupational History  . Occupation: Development worker, community - self employed    Employer: RETIRED  Social Needs  . Financial resource strain: Not hard at all  . Food insecurity    Worry: Never true    Inability: Never true  . Transportation needs    Medical: No    Non-medical: No  Tobacco Use  . Smoking status: Former Smoker    Packs/day: 1.00    Years: 43.00    Pack years: 43.00    Types: Cigarettes    Start date: 10/17/1957    Quit date: 12/13/2000    Years since quitting: 18.4  . Smokeless tobacco: Never Used  . Tobacco comment: started at age 70  Substance and Sexual Activity  . Alcohol use: No    Alcohol/week: 0.0 standard drinks  . Drug use: No   . Sexual activity: Not Currently  Lifestyle  . Physical activity    Days per week: Not on file    Minutes per session: Not on file  . Stress: Not on file  Relationships  . Social Herbalist on phone: Not on file    Gets together: Not on file    Attends religious service: Not on file    Active member of club or organization: Not on file    Attends meetings of clubs or organizations: Not on  file    Relationship status: Not on file  . Intimate partner violence    Fear of current or ex partner: Not on file    Emotionally abused: Not on file    Physically abused: Not on file    Forced sexual activity: Not on file  Other Topics Concern  . Not on file  Social History Narrative   Self employed Development worker, community; 2 daughters.      Allergies  Allergen Reactions  . Spironolactone Other (See Comments)    gynecomastia     Outpatient Medications Prior to Visit  Medication Sig Dispense Refill  . albuterol (PROVENTIL) (2.5 MG/3ML) 0.083% nebulizer solution Take 2.5 mg by nebulization daily.    Marland Kitchen aspirin EC 81 MG tablet Take 1 tablet (81 mg total) by mouth at bedtime.    . bisoprolol (ZEBETA) 5 MG tablet Take 0.5 tablets (2.5 mg total) by mouth daily. 30 tablet 0  . Calcium Carb-Cholecalciferol (CALCIUM 500 + D3 PO) Take 1 tablet by mouth at bedtime.     . carbidopa-levodopa (SINEMET IR) 25-100 MG tablet Take 1 tablet by mouth 3 (three) times daily. 90 tablet 11  . Cyanocobalamin (VITAMIN B-12 IJ) Inject 1,000 mcg as directed every 30 (thirty) days.     Marland Kitchen donepezil (ARICEPT) 10 MG tablet Take 1 tablet (10 mg total) by mouth at bedtime. 90 tablet 4  . eplerenone (INSPRA) 25 MG tablet Take 1 tablet (25 mg total) by mouth daily. Take at bed time (Patient taking differently: Take 12.5 mg by mouth daily. Take at bed time) 90 tablet 3  . FLUoxetine (PROZAC) 10 MG capsule Take 20 mg by mouth at bedtime.     . fluticasone (FLONASE) 50 MCG/ACT nasal spray Place 1 spray into both nostrils daily as  needed (for sinus issues.).     Marland Kitchen furosemide (LASIX) 20 MG tablet Take 3 tablets (60 mg total) by mouth every morning. 120 tablet 2  . losartan (COZAAR) 25 MG tablet Take 12.5 mg by mouth daily.    . memantine (NAMENDA) 10 MG tablet Take 1 tablet (10 mg total) by mouth 2 (two) times daily. 180 tablet 4  . pantoprazole (PROTONIX) 40 MG tablet Take 1 tablet (40 mg total) by mouth daily. 90 tablet 2  . Polyethyl Glycol-Propyl Glycol (LUBRICANT EYE DROPS) 0.4-0.3 % SOLN Place 1-2 drops into both eyes 2 (two) times daily as needed (for dry/irritated eyes.).    Marland Kitchen potassium chloride SA (K-DUR,KLOR-CON) 20 MEQ tablet Take 1 tablet (20 mEq total) by mouth daily. 90 tablet 3  . rosuvastatin (CRESTOR) 10 MG tablet Take 1 tablet (10 mg total) by mouth daily. (Patient taking differently: Take 10 mg by mouth every evening. ) 30 tablet 6  . SYNTHROID 112 MCG tablet Take 1 tablet by mouth daily.     No facility-administered medications prior to visit.     Review of Systems  Constitutional: Negative for chills, fever, malaise/fatigue and weight loss.  HENT: Negative for hearing loss, sore throat and tinnitus.   Eyes: Negative for blurred vision and double vision.  Respiratory: Positive for cough, sputum production and shortness of breath. Negative for hemoptysis, wheezing and stridor.   Cardiovascular: Positive for chest pain. Negative for palpitations, orthopnea, leg swelling and PND.  Gastrointestinal: Negative for abdominal pain, constipation, diarrhea, heartburn, nausea and vomiting.  Genitourinary: Negative for dysuria, hematuria and urgency.  Musculoskeletal: Negative for joint pain and myalgias.  Skin: Negative for itching and rash.  Neurological: Negative for dizziness, tingling,  weakness and headaches.  Endo/Heme/Allergies: Negative for environmental allergies. Does not bruise/bleed easily.  Psychiatric/Behavioral: Negative for depression. The patient is not nervous/anxious and does not have  insomnia.   All other systems reviewed and are negative.    Objective:  Physical Exam Vitals signs reviewed.  Constitutional:      General: He is not in acute distress.    Appearance: He is well-developed.  HENT:     Head: Normocephalic and atraumatic.  Eyes:     General: No scleral icterus.    Conjunctiva/sclera: Conjunctivae normal.     Pupils: Pupils are equal, round, and reactive to light.  Neck:     Musculoskeletal: Neck supple.     Vascular: No JVD.     Trachea: No tracheal deviation.  Cardiovascular:     Rate and Rhythm: Normal rate and regular rhythm.     Heart sounds: Normal heart sounds. No murmur.  Pulmonary:     Effort: Pulmonary effort is normal. No tachypnea, accessory muscle usage or respiratory distress.     Breath sounds: No stridor. Wheezing, rhonchi and rales present.     Comments: Worse in the right upper chest Abdominal:     General: Bowel sounds are normal. There is no distension.     Palpations: Abdomen is soft.     Tenderness: There is no abdominal tenderness.  Musculoskeletal:        General: No tenderness.  Lymphadenopathy:     Cervical: No cervical adenopathy.  Skin:    General: Skin is warm and dry.     Capillary Refill: Capillary refill takes less than 2 seconds.     Findings: No rash.  Neurological:     Mental Status: He is alert and oriented to person, place, and time.  Psychiatric:        Behavior: Behavior normal.      Vitals:   05/29/19 1405  BP: 118/78  Pulse: 60  Temp: 98.1 F (36.7 C)  TempSrc: Oral  SpO2: 95%  Weight: 139 lb 6.4 oz (63.2 kg)  Height: 5\' 9"  (1.753 m)   95% on RA BMI Readings from Last 3 Encounters:  05/29/19 20.59 kg/m  04/16/19 20.83 kg/m  02/13/19 19.92 kg/m   Wt Readings from Last 3 Encounters:  05/29/19 139 lb 6.4 oz (63.2 kg)  04/16/19 137 lb (62.1 kg)  02/13/19 131 lb (59.4 kg)     CBC    Component Value Date/Time   WBC 7.1 12/17/2018 2353   RBC 4.87 12/17/2018 2353   HGB 14.4  12/17/2018 2353   HGB 14.0 10/18/2017 1300   HCT 44.5 12/17/2018 2353   HCT 42.7 10/18/2017 1300   PLT 179 12/17/2018 2353   PLT 259 10/18/2017 1300   MCV 91.4 12/17/2018 2353   MCV 90 10/18/2017 1300   MCH 29.6 12/17/2018 2353   MCHC 32.4 12/17/2018 2353   RDW 13.5 12/17/2018 2353   RDW 14.1 10/18/2017 1300   LYMPHSABS 1.3 10/18/2017 1300   MONOABS 0.4 10/08/2016 1348   EOSABS 0.1 10/18/2017 1300   BASOSABS 0.0 10/18/2017 1300    Chest Imaging: 12/17/2018 chest x-ray: Hyperinflation, stable right lung scarring. The patient's images have been independently reviewed by me.    Pulmonary Functions Testing Results: PFT Results Latest Ref Rng & Units 02/04/2016  FVC-Pre L 2.99  FVC-Predicted Pre % 72  FVC-Post L 3.54  FVC-Predicted Post % 86  Pre FEV1/FVC % % 35  Post FEV1/FCV % % 36  FEV1-Pre L 1.05  FEV1-Predicted Pre % 35  FEV1-Post L 1.26  DLCO UNC% % 27  DLCO COR %Predicted % 38  TLC L 7.52  TLC % Predicted % 109  RV % Predicted % 181    FeNO: None   Pathology: None   Echocardiogram:  48/54/6270: Reduced systolic function, 45 to 35% grade 1 diastolic dysfunction anterior and inferior lateral wall myocardial hypokinesis.  Heart Catheterization: None     Assessment & Plan:     ICD-10-CM   1. Stage 4 very severe COPD by GOLD classification ([place])  J44.9   2. Centrilobular emphysema ([place])  J43.2   3. Chronic systolic heart failure (HCC)  I50.22   4. Ischemic cardiomyopathy  I25.5   5. Lewy body dementia without behavioral disturbance (HCC)  G31.83 SLP eval and treat   F02.80 SLP modified barium swallow    DG Swallowing Func-Speech Pathology  6. Esophageal stricture  K22.2 SLP eval and treat    SLP modified barium swallow    DG Swallowing Func-Speech Pathology  7. Dysphagia, unspecified type  R13.10 SLP eval and treat    SLP modified barium swallow    DG Swallowing Func-Speech Pathology  8. Recurrent pneumonia  J18.9 DG Chest 2 View    Discussion:  This  is a 78 year old gentleman with Lewy body dementia, chronic systolic heart failure, very severe COPD.  I think he also has recurrent aspiration pneumonia.  He has a significant longstanding history of dysphasia coughing with eating prior esophageal stricture status post dilation.  We will send him for an M BSS and evaluation by speech pathology. Due to his recurrent issues and chronic medical conditions we also spent time discussing goals of care. He can continue on his current Symbicort regimen. Continue albuterol for shortness of breath and wheezing. Ideally he should be on triple therapy.  The per the patient's wife he was placed on this in the past and had difficulty with 2 inhalers.  There was some issue regarding eye symptoms and difficulty seeing.  Question whether or not this is related to anticholinergic use.  Therefore at this time we will continue with his current inhaler regimen.  Patient should follow-up with [place] in 8 weeks to have a repeat chest x-ray for evaluation/resolution of the right upper lobe pneumonia that he was diagnosed with his PCP office.  Need to make sure that this clears.  He can see 1 of our nurse practitioners in 8 weeks for review of the chest film.  Today in the office we discussed his goals of care.  We discussed CODE STATUS we discussed filling out a [place] form.  We also discussed some of his chronic comorbidities that would lead to the pertinence of having this kind of decisions made in advance prior to a serious hospitalization.  22 minutes of time was spent with advanced care planning discussing the above.  Patient was given a blank a DNR form as well as a blank [place] form to review along with his wife.  I told him I would be glad to help him fill this paperwork out if they desired or with their primary care doctor.  They would like to meet with family and discuss before making any further decisions.  Greater than 50% of this  patient's 45-minute of visit was spent face-to-face discussing the recommendations and treatment plan.   Current Outpatient Medications:  .  albuterol (PROVENTIL) (2.5 MG/3ML) 0.083% nebulizer solution, Take 2.5 mg by nebulization daily., Disp: , Rfl:  .  aspirin EC 81 MG tablet, Take 1 tablet (81 mg total) by mouth at bedtime., Disp: , Rfl:  .  bisoprolol (ZEBETA) 5 MG tablet, Take 0.5 tablets (2.5 mg total) by mouth daily., Disp: 30 tablet, Rfl: 0 .  Calcium Carb-Cholecalciferol (CALCIUM 500 + D3 PO), Take 1 tablet by mouth at bedtime. , Disp: , Rfl:  .  carbidopa-levodopa (SINEMET IR) 25-100 MG tablet, Take 1 tablet by mouth 3 (three) times daily., Disp: 90 tablet, Rfl: 11 .  Cyanocobalamin (VITAMIN B-12 IJ), Inject 1,000 mcg as directed every 30 (thirty) days. , Disp: , Rfl:  .  donepezil (ARICEPT) 10 MG tablet, Take 1 tablet (10 mg total) by mouth at bedtime., Disp: 90 tablet, Rfl: 4 .  eplerenone (INSPRA) 25 MG tablet, Take 1 tablet (25 mg total) by mouth daily. Take at bed time (Patient taking differently: Take 12.5 mg by mouth daily. Take at bed time), Disp: 90 tablet, Rfl: 3 .  FLUoxetine (PROZAC) 10 MG capsule, Take 20 mg by mouth at bedtime. , Disp: , Rfl:  .  fluticasone (FLONASE) 50 MCG/ACT nasal spray, Place 1 spray into both nostrils daily as needed (for sinus issues.). , Disp: , Rfl:  .  furosemide (LASIX) 20 MG tablet, Take 3 tablets (60 mg total) by mouth every morning., Disp: 120 tablet, Rfl: 2 .  losartan (COZAAR) 25 MG tablet, Take 12.5 mg by mouth daily., Disp: , Rfl:  .  memantine (NAMENDA) 10 MG tablet, Take 1 tablet (10 mg total) by mouth 2 (two) times daily., Disp: 180 tablet, Rfl: 4 .  pantoprazole (PROTONIX) 40 MG tablet, Take 1 tablet (40 mg total) by mouth daily., Disp: 90 tablet, Rfl: 2 .  Polyethyl Glycol-Propyl Glycol (LUBRICANT EYE DROPS) 0.4-0.3 % SOLN, Place 1-2 drops into both eyes 2 (two) times daily as needed (for dry/irritated eyes.)., Disp: , Rfl:  .   potassium chloride SA (K-DUR,KLOR-CON) 20 MEQ tablet, Take 1 tablet (20 mEq total) by mouth daily., Disp: 90 tablet, Rfl: 3 .  rosuvastatin (CRESTOR) 10 MG tablet, Take 1 tablet (10 mg total) by mouth daily. (Patient taking differently: Take 10 mg by mouth every evening. ), Disp: 30 tablet, Rfl: 6 .  SYNTHROID 112 MCG tablet, Take 1 tablet by mouth daily., Disp: , Rfl:    Garner Nash, DO [facility] 05/29/2019 2:30 PM

## 2019-05-29 NOTE — Patient Instructions (Addendum)
Thank you for visiting Dr. Valeta Harms at [facility]. Today we recommend the following: Orders Placed This Encounter  Procedures  . DG Swallowing Func-Speech Pathology  . DG Chest 2 View  . SLP eval and treat  . SLP modified barium swallow    Return in about 8 weeks (around 07/24/2019) for with APP.    Please do your part to reduce the spread of COVID-19.

## 2019-06-04 ENCOUNTER — Ambulatory Visit (HOSPITAL_COMMUNITY)
Admission: RE | Admit: 2019-06-04 | Discharge: 2019-06-04 | Disposition: A | Discharge status: Home / Self Care | Payer: Medicare Other | Source: Ambulatory Visit | Attending: Pulmonary Disease | Admitting: Pulmonary Disease

## 2019-06-04 ENCOUNTER — Other Ambulatory Visit: Payer: Self-pay

## 2019-06-04 DIAGNOSIS — F0280 Dementia in other diseases classified elsewhere without behavioral disturbance: Secondary | ICD-10-CM | POA: Diagnosis present

## 2019-06-04 DIAGNOSIS — K222 Esophageal obstruction: Secondary | ICD-10-CM | POA: Insufficient documentation

## 2019-06-04 DIAGNOSIS — G3183 Dementia with Lewy bodies: Secondary | ICD-10-CM | POA: Diagnosis present

## 2019-06-04 DIAGNOSIS — R1310 Dysphagia, unspecified: Secondary | ICD-10-CM | POA: Diagnosis not present

## 2019-06-18 ENCOUNTER — Telehealth (HOSPITAL_COMMUNITY): Payer: Self-pay | Admitting: Pharmacy Technician

## 2019-06-18 NOTE — Telephone Encounter (Signed)
Had a notice from [facility] that patient required a PA on Entresto 24-26mg . Ran a test clinic and there is no prior authorization required at this time.   Co-pay: $142.00 for 30 Day Supply  Called to confirm that patient can afford this co-pay, left voicemail.  Charlann Boxer, CPhT

## 2019-06-25 ENCOUNTER — Other Ambulatory Visit (HOSPITAL_COMMUNITY): Payer: Self-pay | Admitting: *Deleted

## 2019-06-25 DIAGNOSIS — I5022 Chronic systolic (congestive) heart failure: Secondary | ICD-10-CM

## 2019-06-25 MED ORDER — FUROSEMIDE 20 MG PO TABS: 60.0000 mg | ORAL_TABLET | Freq: Every morning | ORAL | 2 refills | Status: DC

## 2019-06-30 ENCOUNTER — Other Ambulatory Visit (HOSPITAL_COMMUNITY): Payer: Self-pay | Admitting: Cardiology

## 2019-07-09 ENCOUNTER — Ambulatory Visit (INDEPENDENT_AMBULATORY_CARE_PROVIDER_SITE_OTHER): Payer: Medicare Other | Admitting: *Deleted

## 2019-07-09 DIAGNOSIS — I472 Ventricular tachycardia, unspecified: Secondary | ICD-10-CM

## 2019-07-09 DIAGNOSIS — I5022 Chronic systolic (congestive) heart failure: Secondary | ICD-10-CM

## 2019-07-09 LAB — CUP PACEART REMOTE DEVICE CHECK
Battery Remaining Longevity: 29 mo
Battery Voltage: 2.96 V
Brady Statistic AP VP Percent: 94.63 %
Brady Statistic AP VS Percent: 0.02 %
Brady Statistic AS VP Percent: 5.34 %
Brady Statistic AS VS Percent: 0.01 %
Brady Statistic RA Percent Paced: 94.04 %
Brady Statistic RV Percent Paced: 99.4 %
Date Time Interrogation Session: 20200922112246
Implantable Lead Implant Date: 20050810
Implantable Lead Implant Date: 20050810
Implantable Lead Implant Date: 20071015
Implantable Lead Location: 753858
Implantable Lead Location: 753859
Implantable Lead Location: 753860
Implantable Lead Model: 4194
Implantable Lead Model: 5076
Implantable Lead Model: 6949
Implantable Pulse Generator Implant Date: 20170718
Lead Channel Impedance Value: 247 Ohm
Lead Channel Impedance Value: 399 Ohm
Lead Channel Impedance Value: 456 Ohm
Lead Channel Impedance Value: 475 Ohm
Lead Channel Impedance Value: 494 Ohm
Lead Channel Impedance Value: 494 Ohm
Lead Channel Impedance Value: 551 Ohm
Lead Channel Impedance Value: 608 Ohm
Lead Channel Impedance Value: 627 Ohm
Lead Channel Pacing Threshold Amplitude: 0.5 V
Lead Channel Pacing Threshold Amplitude: 1.25 V
Lead Channel Pacing Threshold Pulse Width: 0.4 ms
Lead Channel Pacing Threshold Pulse Width: 0.4 ms
Lead Channel Sensing Intrinsic Amplitude: 16.875 mV
Lead Channel Sensing Intrinsic Amplitude: 16.875 mV
Lead Channel Sensing Intrinsic Amplitude: 3.375 mV
Lead Channel Sensing Intrinsic Amplitude: 3.375 mV
Lead Channel Setting Pacing Amplitude: 1.5 V
Lead Channel Setting Pacing Amplitude: 3 V
Lead Channel Setting Pacing Amplitude: 3.75 V
Lead Channel Setting Pacing Pulse Width: 0.6 ms
Lead Channel Setting Pacing Pulse Width: 1 ms
Lead Channel Setting Sensing Sensitivity: 2.8 mV

## 2019-07-16 NOTE — Progress Notes (Signed)
Remote pacemaker transmission.   

## 2019-07-22 ENCOUNTER — Ambulatory Visit: Payer: Medicare Other | Admitting: Neurology

## 2019-07-25 ENCOUNTER — Ambulatory Visit: Payer: Medicare Other | Admitting: Pulmonary Disease

## 2019-08-19 ENCOUNTER — Telehealth (HOSPITAL_COMMUNITY): Payer: Self-pay | Admitting: Licensed Clinical Social Worker

## 2019-08-19 NOTE — Telephone Encounter (Signed)
CSW received call from pt wife requesting help reordering patients Inspra.  CSW called [facility] and placed order- order # [id number]  CSW also confirmed that they are taking applications for assistance for 2021- CSW emailed pt application so we can apply for his eligibility runs out in December.  CSW will continue to follow and assist as needed  [name], [facility] Desk#: [phone] Cell#: [phone]

## 2019-08-21 ENCOUNTER — Other Ambulatory Visit: Payer: Self-pay

## 2019-08-21 ENCOUNTER — Ambulatory Visit: Payer: Medicare Other | Admitting: Internal Medicine

## 2019-08-21 ENCOUNTER — Encounter: Payer: Self-pay | Admitting: Internal Medicine

## 2019-08-21 VITALS — BP 142/76 | HR 68 | Ht 69.0 in | Wt 139.2 lb

## 2019-08-21 DIAGNOSIS — I5022 Chronic systolic (congestive) heart failure: Secondary | ICD-10-CM | POA: Diagnosis not present

## 2019-08-21 DIAGNOSIS — Z950 Presence of cardiac pacemaker: Secondary | ICD-10-CM | POA: Diagnosis not present

## 2019-08-21 DIAGNOSIS — I255 Ischemic cardiomyopathy: Secondary | ICD-10-CM | POA: Diagnosis not present

## 2019-08-21 NOTE — Progress Notes (Signed)
HPI [name] returns today for followup of CHB, prior LV dysfunction, s/p Biv ICD with down grade to a biv PPM. The patient has developed Lewy body dementia. He denies chest pain or sob. No syncope or edema. He has severe emphysema and is on home oxygen. Allergies  Allergen Reactions  . Spironolactone Other (See Comments)    gynecomastia     Current Outpatient Medications  Medication Sig Dispense Refill  . albuterol (PROVENTIL) (2.5 MG/3ML) 0.083% nebulizer solution Take 2.5 mg by nebulization daily.    Marland Kitchen aspirin EC 81 MG tablet Take 1 tablet (81 mg total) by mouth at bedtime.    . bisoprolol (ZEBETA) 5 MG tablet Take 0.5 tablets (2.5 mg total) by mouth daily. 30 tablet 0  . Calcium Carb-Cholecalciferol (CALCIUM 500 + D3 PO) Take 1 tablet by mouth at bedtime.     . carbidopa-levodopa (SINEMET IR) 25-100 MG tablet Take 1 tablet by mouth 3 (three) times daily. 90 tablet 11  . Cyanocobalamin (VITAMIN B-12 IJ) Inject 1,000 mcg as directed every 30 (thirty) days.     Marland Kitchen donepezil (ARICEPT) 10 MG tablet Take 1 tablet (10 mg total) by mouth at bedtime. 90 tablet 4  . eplerenone (INSPRA) 25 MG tablet Take 1 tablet (25 mg total) by mouth daily. Take at bed time (Patient taking differently: Take 12.5 mg by mouth daily. Take at bed time) 90 tablet 3  . FLUoxetine (PROZAC) 10 MG capsule Take 20 mg by mouth at bedtime.     . fluticasone (FLONASE) 50 MCG/ACT nasal spray Place 1 spray into both nostrils daily as needed (for sinus issues.).     Marland Kitchen furosemide (LASIX) 20 MG tablet Take 3 tablets (60 mg total) by mouth every morning. 270 tablet 2  . losartan (COZAAR) 25 MG tablet Take 12.5 mg by mouth daily.    . memantine (NAMENDA) 10 MG tablet Take 1 tablet (10 mg total) by mouth 2 (two) times daily. 180 tablet 4  . pantoprazole (PROTONIX) 40 MG tablet Take 1 tablet (40 mg total) by mouth daily. 90 tablet 2  . Polyethyl Glycol-Propyl Glycol (LUBRICANT EYE DROPS) 0.4-0.3 % SOLN Place 1-2 drops into  both eyes 2 (two) times daily as needed (for dry/irritated eyes.).    Marland Kitchen potassium chloride SA (K-DUR,KLOR-CON) 20 MEQ tablet Take 1 tablet (20 mEq total) by mouth daily. 90 tablet 3  . rosuvastatin (CRESTOR) 10 MG tablet TAKE 1 TABLET BY MOUTH EVERY DAY 90 tablet 3  . SYNTHROID 112 MCG tablet Take 1 tablet by mouth daily.     No current facility-administered medications for this visit.      Past Medical History:  Diagnosis Date  . AICD (automatic cardioverter/defibrillator) present   . Aortic insufficiency     moderate aortic ins moderate/asymptomatic/normal LV cavity size.   . Carotid artery disease ([place])     less than 50% stenosis bilaterally  . Carotid artery disease ([place])   . Chronic systolic CHF (congestive heart failure) ([place])   . Coronary artery disease   . Drug-induced gynecomastia     secondary to spironolactone  . Dyslipidemia   . Hypertension   . ICD (implantable cardiac defibrillator), biventricular, in situ     Medtronic model  [id number]  . Ischemic cardiomyopathy     status post non-ST elevation microinfarction stent to the right coronary artery is a   . LV dysfunction     NYHA class I/prior ejection fraction 20-25% and an ejection fraction  7 2009 50-55%, ejection fraction 60% bedside echocardiogram July 2012   . PONV (postoperative nausea and vomiting)   . Pulmonary embolism ([facility])    Prior history of pulmonary embolism off Coumadin.  . Ventricular tachycardia ([place])    Inducible ventricular polymorphic tachycardia status post ICD followed by upgrade with CRT-D 2007, battery change at 01/31/2011    ROS:   All systems reviewed and negative except as noted in the HPI.   Past Surgical History:  Procedure Laterality Date  . ABLATION OF DYSRHYTHMIC FOCUS  10/20/2017   atrial tach  . ATRIAL TACH ABLATION N/A 10/20/2017   Procedure: ATRIAL TACH ABLATION;  Surgeon: Evans Lance, MD;  Location: [facility];  Service: Cardiovascular;  Laterality: N/A;  . BIOPSY   12/20/2018   Procedure: BIOPSY;  Surgeon: Rogene Houston, MD;  [facility];  Service: Endoscopy;;  colon  . CARDIAC CATHETERIZATION    . CARDIAC CATHETERIZATION N/A 12/10/2015   Procedure: Right/Left Heart Cath and Coronary Angiography;  Surgeon: Peter M Martinique, MD;  Location: [facility];  Service: Cardiovascular;  Laterality: N/A;  . CARDIAC DEFIBRILLATOR PLACEMENT     medtronic, remote - yes  . CATARACT EXTRACTION W/PHACO Left 08/25/2014   Procedure: CATARACT EXTRACTION PHACO AND INTRAOCULAR LENS PLACEMENT (IOC);  Surgeon: Tonny Branch, MD;  Location: [facility];  Service: Ophthalmology;  Laterality: Left;  CDE 5.79  . CATARACT EXTRACTION W/PHACO Right 09/18/2014   Procedure: CATARACT EXTRACTION PHACO AND INTRAOCULAR LENS PLACEMENT RIGHT EYE;  Surgeon: Tonny Branch, MD;  Location: [facility];  Service: Ophthalmology;  Laterality: Right;  CDE:3.35  . COLONOSCOPY N/A 12/20/2018   Procedure: COLONOSCOPY;  Surgeon: Rogene Houston, MD;  [facility];  Service: Endoscopy;  Laterality: N/A;  730  . CORONARY STENT PLACEMENT    . EP IMPLANTABLE DEVICE N/A 05/03/2016   Procedure: BIV PPM Generator Changeout;  Surgeon: Evans Lance, MD;  Location: [facility];  Service: Cardiovascular;  Laterality: N/A;  . ESOPHAGOSCOPY WITH DILITATION  12/20/2018   Procedure: ESOPHAGOSCOPY WITH DILITATION;  Surgeon: Rogene Houston, MD;  [facility];  Service: Endoscopy;;  . INGUINAL HERNIA REPAIR    . left breast biopsy    . POLYPECTOMY  12/20/2018   Procedure: POLYPECTOMY;  Surgeon: Rogene Houston, MD;  [facility];  Service: Endoscopy;;  colon  . RETROPUBIC PROSTATECTOMY    . THORACOTOMY     left  . THYROID SURGERY    . THYROIDECTOMY       Family History  Problem Relation Age of Onset  . Diabetes type I Sister        daughter   . Lung cancer Brother        several types of cancer  . Lung cancer Brother        several types of cancer  . Lung cancer  Brother   . Clotting disorder Neg Hx        also no repiratory disease   . Prostate cancer Neg Hx      Social History   Socioeconomic History  . Marital status: Married    Spouse name: Not on file  . Number of children: 2  . Years of education: 10  . Highest education level: Not on file  Occupational History  . Occupation: Development worker, community - self employed    Employer: RETIRED  Social Needs  . Financial resource strain: Not hard at all  . Food  insecurity    Worry: Never true    Inability: Never true  . Transportation needs    Medical: No    Non-medical: No  Tobacco Use  . Smoking status: Former Smoker    Packs/day: 1.00    Years: 43.00    Pack years: 43.00    Types: Cigarettes    Start date: 10/17/1957    Quit date: 12/13/2000    Years since quitting: 18.6  . Smokeless tobacco: Never Used  . Tobacco comment: started at age 33  Substance and Sexual Activity  . Alcohol use: No    Alcohol/week: 0.0 standard drinks  . Drug use: No  . Sexual activity: Not Currently  Lifestyle  . Physical activity    Days per week: Not on file    Minutes per session: Not on file  . Stress: Not on file  Relationships  . Social Herbalist on phone: Not on file    Gets together: Not on file    Attends religious service: Not on file    Active member of club or organization: Not on file    Attends meetings of clubs or organizations: Not on file    Relationship status: Not on file  . Intimate partner violence    Fear of current or ex partner: Not on file    Emotionally abused: Not on file    Physically abused: Not on file    Forced sexual activity: Not on file  Other Topics Concern  . Not on file  Social History Narrative   Self employed Development worker, community; 2 daughters.      BP (!) 142/76   Pulse 68   Ht 5\' 9"  (1.753 m)   Wt 139 lb 3.2 oz (63.1 kg)   SpO2 92%   BMI 20.56 kg/m   Physical Exam:  Well appearing NAD HEENT: Unremarkable Neck:  No JVD, no thyromegally Lymphatics:  No  adenopathy Back:  No CVA tenderness Lungs:  Clear with no wheezes HEART:  Regular rate rhythm, no murmurs, no rubs, no clicks Abd:  soft, positive bowel sounds, no organomegally, no rebound, no guarding Ext:  2 plus pulses, no edema, no cyanosis, no clubbing Skin:  No rashes no nodules Neuro:  CN II through XII intact, motor grossly intact  EKG - P synchronous ventricular pacing  DEVICE  Normal device function.  See PaceArt for details.   Assess/Plan: 1. CHB -  He is asymptoamtic s/p PPM insertion. 2. Chronic systolic/diastolic heart failure- he appears to be euvolemic by exam today. He will continue his current meds. 3. Biv PPM - his medtronic BIV PPM is working normally. He will be rechecked in several months.  [name].D.

## 2019-08-21 NOTE — Patient Instructions (Signed)
Medication Instructions:  Your physician recommends that you continue on your current medications as directed. Please refer to the Current Medication list given to you today.  Labwork: None ordered.  Testing/Procedures: None ordered.  Follow-Up: Your physician wants you to follow-up in: one year with Dr. Lovena Le.   You will receive a reminder letter in the mail two months in advance. If you don't receive a letter, please call our office to schedule the follow-up appointment.  Remote monitoring is used to monitor your Pacemaker from home. This monitoring reduces the number of office visits required to check your device to one time per year. It allows [place] to keep an eye on the functioning of your device to ensure it is working properly. You are scheduled for a device check from home on 10/08/2019. You may send your transmission at any time that day. If you have a wireless device, the transmission will be sent automatically. After your physician reviews your transmission, you will receive a postcard with your next transmission date.  Any Other Special Instructions Will Be Listed Below (If Applicable).  If you need a refill on your cardiac medications before your next appointment, please call your pharmacy.

## 2019-09-06 ENCOUNTER — Telehealth (HOSPITAL_COMMUNITY): Payer: Self-pay | Admitting: Licensed Clinical Social Worker

## 2019-09-06 NOTE — Telephone Encounter (Signed)
CSW received call from pt wife inquiring about if pt [facility]ra has arrived yet.  CSW was able to locate pt Inspra in the clinic and informed pt wife- she will plan to pick up on Monday- medication placed at front desk.  CSW will continue to follow and assist as needed  [name], [facility] Desk#: [phone] Cell#: [phone]

## 2019-10-08 ENCOUNTER — Ambulatory Visit (INDEPENDENT_AMBULATORY_CARE_PROVIDER_SITE_OTHER): Payer: Medicare Other | Admitting: *Deleted

## 2019-10-08 DIAGNOSIS — I472 Ventricular tachycardia, unspecified: Secondary | ICD-10-CM

## 2019-10-08 LAB — CUP PACEART REMOTE DEVICE CHECK
Battery Remaining Longevity: 26 mo
Battery Voltage: 2.95 V
Brady Statistic AP VP Percent: 89.47 %
Brady Statistic AP VS Percent: 0 %
Brady Statistic AS VP Percent: 10.52 %
Brady Statistic AS VS Percent: 0.01 %
Brady Statistic RA Percent Paced: 87.7 %
Brady Statistic RV Percent Paced: 99.81 %
Date Time Interrogation Session: 20201222102946
Implantable Lead Implant Date: 20050810
Implantable Lead Implant Date: 20050810
Implantable Lead Implant Date: 20071015
Implantable Lead Location: 753858
Implantable Lead Location: 753859
Implantable Lead Location: 753860
Implantable Lead Model: 4194
Implantable Lead Model: 5076
Implantable Lead Model: 6949
Implantable Pulse Generator Implant Date: 20170718
Lead Channel Impedance Value: 247 Ohm
Lead Channel Impedance Value: 399 Ohm
Lead Channel Impedance Value: 456 Ohm
Lead Channel Impedance Value: 456 Ohm
Lead Channel Impedance Value: 475 Ohm
Lead Channel Impedance Value: 513 Ohm
Lead Channel Impedance Value: 551 Ohm
Lead Channel Impedance Value: 589 Ohm
Lead Channel Impedance Value: 627 Ohm
Lead Channel Pacing Threshold Amplitude: 0.5 V
Lead Channel Pacing Threshold Amplitude: 1.375 V
Lead Channel Pacing Threshold Pulse Width: 0.4 ms
Lead Channel Pacing Threshold Pulse Width: 0.4 ms
Lead Channel Sensing Intrinsic Amplitude: 16.875 mV
Lead Channel Sensing Intrinsic Amplitude: 16.875 mV
Lead Channel Sensing Intrinsic Amplitude: 3.5 mV
Lead Channel Sensing Intrinsic Amplitude: 3.5 mV
Lead Channel Setting Pacing Amplitude: 1.5 V
Lead Channel Setting Pacing Amplitude: 3 V
Lead Channel Setting Pacing Amplitude: 3.75 V
Lead Channel Setting Pacing Pulse Width: 0.6 ms
Lead Channel Setting Pacing Pulse Width: 1 ms
Lead Channel Setting Sensing Sensitivity: 2.8 mV

## 2019-11-03 ENCOUNTER — Emergency Department (HOSPITAL_COMMUNITY): Payer: Medicare Other

## 2019-11-03 ENCOUNTER — Other Ambulatory Visit: Payer: Self-pay

## 2019-11-03 ENCOUNTER — Encounter (HOSPITAL_COMMUNITY): Payer: Self-pay | Admitting: *Deleted

## 2019-11-03 ENCOUNTER — Emergency Department (HOSPITAL_COMMUNITY)
Admission: EM | Admit: 2019-11-03 | Discharge: 2019-11-04 | Disposition: A | Discharge status: Home / Self Care | Payer: Medicare Other | Attending: Emergency Medicine | Admitting: Emergency Medicine

## 2019-11-03 DIAGNOSIS — I5022 Chronic systolic (congestive) heart failure: Secondary | ICD-10-CM | POA: Diagnosis not present

## 2019-11-03 DIAGNOSIS — R410 Disorientation, unspecified: Secondary | ICD-10-CM | POA: Diagnosis not present

## 2019-11-03 DIAGNOSIS — Z79899 Other long term (current) drug therapy: Secondary | ICD-10-CM | POA: Insufficient documentation

## 2019-11-03 DIAGNOSIS — Z20822 Contact with and (suspected) exposure to covid-19: Secondary | ICD-10-CM | POA: Diagnosis not present

## 2019-11-03 DIAGNOSIS — I2510 Atherosclerotic heart disease of native coronary artery without angina pectoris: Secondary | ICD-10-CM | POA: Diagnosis not present

## 2019-11-03 DIAGNOSIS — Z7982 Long term (current) use of aspirin: Secondary | ICD-10-CM | POA: Insufficient documentation

## 2019-11-03 DIAGNOSIS — I10 Essential (primary) hypertension: Secondary | ICD-10-CM | POA: Diagnosis not present

## 2019-11-03 DIAGNOSIS — Z87891 Personal history of nicotine dependence: Secondary | ICD-10-CM | POA: Insufficient documentation

## 2019-11-03 DIAGNOSIS — R519 Headache, unspecified: Secondary | ICD-10-CM | POA: Diagnosis not present

## 2019-11-03 DIAGNOSIS — R4182 Altered mental status, unspecified: Secondary | ICD-10-CM | POA: Diagnosis present

## 2019-11-03 DIAGNOSIS — Z95810 Presence of automatic (implantable) cardiac defibrillator: Secondary | ICD-10-CM | POA: Diagnosis not present

## 2019-11-03 DIAGNOSIS — R0602 Shortness of breath: Secondary | ICD-10-CM | POA: Diagnosis not present

## 2019-11-03 DIAGNOSIS — I110 Hypertensive heart disease with heart failure: Secondary | ICD-10-CM | POA: Insufficient documentation

## 2019-11-03 DIAGNOSIS — I4510 Unspecified right bundle-branch block: Secondary | ICD-10-CM | POA: Diagnosis not present

## 2019-11-03 LAB — CBC WITH DIFFERENTIAL/PLATELET
Abs Immature Granulocytes: 0.04 10*3/uL (ref 0.00–0.07)
Basophils Absolute: 0 10*3/uL (ref 0.0–0.1)
Basophils Relative: 1 %
Eosinophils Absolute: 0.1 10*3/uL (ref 0.0–0.5)
Eosinophils Relative: 2 %
HCT: 46.3 % (ref 39.0–52.0)
Hemoglobin: 15 g/dL (ref 13.0–17.0)
Immature Granulocytes: 1 %
Lymphocytes Relative: 38 %
Lymphs Abs: 2.1 10*3/uL (ref 0.7–4.0)
MCH: 30.4 pg (ref 26.0–34.0)
MCHC: 32.4 g/dL (ref 30.0–36.0)
MCV: 93.7 fL (ref 80.0–100.0)
Monocytes Absolute: 0.6 10*3/uL (ref 0.1–1.0)
Monocytes Relative: 10 %
Neutro Abs: 2.7 10*3/uL (ref 1.7–7.7)
Neutrophils Relative %: 48 %
Platelets: 172 10*3/uL (ref 150–400)
RBC: 4.94 MIL/uL (ref 4.22–5.81)
RDW: 13.4 % (ref 11.5–15.5)
WBC: 5.6 10*3/uL (ref 4.0–10.5)
nRBC: 0 % (ref 0.0–0.2)

## 2019-11-03 LAB — COMPREHENSIVE METABOLIC PANEL
ALT: 14 U/L (ref 0–44)
AST: 28 U/L (ref 15–41)
Albumin: 4.3 g/dL (ref 3.5–5.0)
Alkaline Phosphatase: 87 U/L (ref 38–126)
Anion gap: 9 (ref 5–15)
BUN: 21 mg/dL (ref 8–23)
CO2: 31 mmol/L (ref 22–32)
Calcium: 9.1 mg/dL (ref 8.9–10.3)
Chloride: 101 mmol/L (ref 98–111)
Creatinine, Ser: 0.92 mg/dL (ref 0.61–1.24)
GFR calc Af Amer: >60 mL/min (ref >60)
GFR calc non Af Amer: >60 mL/min (ref >60)
Glucose, Bld: 91 mg/dL (ref 70–99)
Potassium: 4.2 mmol/L (ref 3.5–5.1)
Sodium: 141 mmol/L (ref 135–145)
Total Bilirubin: 0.5 mg/dL (ref 0.3–1.2)
Total Protein: 6.9 g/dL (ref 6.5–8.1)

## 2019-11-03 LAB — FERRITIN: Ferritin: 73 ng/mL (ref 24–336)

## 2019-11-03 LAB — D-DIMER, QUANTITATIVE: D-Dimer, Quant: 0.42 ug/mL-FEU (ref 0.00–0.50)

## 2019-11-03 LAB — FIBRINOGEN: Fibrinogen: 440 mg/dL (ref 210–475)

## 2019-11-03 LAB — URINALYSIS, ROUTINE W REFLEX MICROSCOPIC
Bilirubin Urine: NEGATIVE
Glucose, UA: NEGATIVE mg/dL
Hgb urine dipstick: NEGATIVE
Ketones, ur: 5 mg/dL — AB
Leukocytes,Ua: NEGATIVE
Nitrite: NEGATIVE
Protein, ur: NEGATIVE mg/dL
Specific Gravity, Urine: 1.02 (ref 1.005–1.030)
pH: 5 (ref 5.0–8.0)

## 2019-11-03 LAB — PROCALCITONIN: Procalcitonin: <0.1 ng/mL

## 2019-11-03 LAB — POC SARS CORONAVIRUS 2 AG -  ED: SARS Coronavirus 2 Ag: NEGATIVE

## 2019-11-03 LAB — TRIGLYCERIDES: Triglycerides: 163 mg/dL — ABNORMAL HIGH (ref <150)

## 2019-11-03 LAB — LACTATE DEHYDROGENASE: LDH: 155 U/L (ref 98–192)

## 2019-11-03 LAB — LACTIC ACID, PLASMA: Lactic Acid, Venous: 1.4 mmol/L (ref 0.5–1.9)

## 2019-11-03 LAB — C-REACTIVE PROTEIN: CRP: 1.1 mg/dL — ABNORMAL HIGH (ref <1.0)

## 2019-11-03 MED ORDER — SODIUM CHLORIDE 0.9 % IV SOLN
1000.0000 mL | INTRAVENOUS | Status: DC
  Administered 2019-11-03: 23:00:00 1000 mL via INTRAVENOUS

## 2019-11-03 MED ORDER — ALBUTEROL SULFATE HFA 108 (90 BASE) MCG/ACT IN AERS
4.0000 | INHALATION_SPRAY | RESPIRATORY_TRACT | Status: DC | PRN
  Administered 2019-11-04: 4 via RESPIRATORY_TRACT
  Filled 2019-11-03: qty 6.7

## 2019-11-03 NOTE — Discharge Instructions (Signed)
You have been evaluated for potential COVID-19 infection.  Your test will result in 24 hrs.  Please check MyChart for result.  Use albuterol inhaler 2-4 puffs every 4 hours as needed for shortness of breath and wheezing.  Follow instruction below.  Return if you have any concerns.

## 2019-11-03 NOTE — ED Provider Notes (Signed)
[facility] EMERGENCY DEPARTMENT Provider Note   CSN: [id number] Arrival date & time: 11/03/19  2207     History Chief Complaint  Patient presents with  . Altered Mental Status    [name] is a 79 y.o. male.  The history is provided by the patient, a relative and medical records. No language interpreter was used.  Altered Mental Status    79 year old male with history of aortic insufficiency status post AICD, carotid artery disease, CHF, hypertension presenting for evaluation of altered mental status.  Per daughter, patient lives at home with his wife.  For the past 2 to 3 days he appears to be more confused than usual.  He also has some increased trouble breathing and wheezing and yesterday he complained of headache to the back of his head.  No report of any fever or chills no vision changes no runny nose or sneezing no loss of taste or smell no urinary symptom no complaints of numbness or weakness or having any chest pain or abdominal pain.  Patient had a Covid vaccine 3 days prior.  He has Lewy body dementia.  No recent sick contact.  Past Medical History:  Diagnosis Date  . AICD (automatic cardioverter/defibrillator) present   . Aortic insufficiency     moderate aortic ins moderate/asymptomatic/normal LV cavity size.   . Carotid artery disease ([place])     less than 50% stenosis bilaterally  . Carotid artery disease ([place])   . Chronic systolic CHF (congestive heart failure) ([place])   . Coronary artery disease   . Drug-induced gynecomastia     secondary to spironolactone  . Dyslipidemia   . Hypertension   . ICD (implantable cardiac defibrillator), biventricular, in situ     Medtronic model  [id number]  . Ischemic cardiomyopathy     status post non-ST elevation microinfarction stent to the right coronary artery is a   . LV dysfunction     NYHA class I/prior ejection fraction 20-25% and an ejection fraction 7 2009 50-55%, ejection fraction 60% bedside echocardiogram July 2012    . PONV (postoperative nausea and vomiting)   . Pulmonary embolism (H[facility])    Prior history of pulmonary embolism off Coumadin.  . Ventricular tachycardia ([place])    Inducible ventricular polymorphic tachycardia status post ICD followed by upgrade with CRT-D 2007, battery change at 01/31/2011    Patient Active Problem List   Diagnosis Date Noted  . Dementia ([place]) 03/20/2019  . Gait abnormality 03/20/2019  . Vitamin B12 deficiency 03/20/2019  . Dementia with Lewy bodies (CODE) ([place]) 03/20/2019  . Special screening for malignant neoplasms, colon 08/17/2018  . Memory loss 07/02/2018  . History of pneumonia 02/21/2018  . History of thyroid cancer 01/10/2018  . Postsurgical hypothyroidism 01/10/2018  . Pulmonary embolism ([place])   . PONV (postoperative nausea and vomiting)   . AICD (automatic cardioverter/defibrillator) present   . Hx pulmonary embolism 01/02/2017  . Status post biventricular pacemaker 01/02/2017  . Sepsis ([place]) 10/08/2016  . Pulmonary hypertension ([place]) 12/14/2015  . Coronary artery disease involving native coronary artery without angina pectoris 12/14/2015  . Laryngopharyngeal reflux (LPR) 12/14/2015  . Abnormal nuclear stress test 12/10/2015  . Dyspnea 12/10/2015  . Atrial tachycardia ([place]) 10/06/2015  . Postsurgical percutaneous transluminal coronary angioplasty (PTCA) status 09/14/2012  . Mild carotid artery disease ([place]) 09/14/2012  . Polymorphic ventricular tachycardia ([place]) 09/14/2012  . Chronic systolic heart failure ([place]) 05/22/2012  . Ischemic cardiomyopathy   . LV dysfunction   . Ventricular tachycardia ([place])   .  Drug-induced gynecomastia   . Carotid artery disease ([place])   . Aortic insufficiency   . Dyslipidemia   . Biventricular implantable cardioverter-defibrillator in situ   . DYSLIPIDEMIA 08/23/2010  . CAROTID BRUIT 08/23/2010  . Centrilobular emphysema ([place]) 04/10/2009  . NEPHROLITHIASIS 04/10/2009  . RENAL CALCULUS, HX OF 04/10/2009    Past  Surgical History:  Procedure Laterality Date  . ABLATION OF DYSRHYTHMIC FOCUS  10/20/2017   atrial tach  . ATRIAL TACH ABLATION N/A 10/20/2017   Procedure: ATRIAL TACH ABLATION;  Surgeon: Evans Lance, MD;  Location: [place] [facility];  Service: Cardiovascular;  Laterality: N/A;  . BIOPSY  12/20/2018   Procedure: BIOPSY;  Surgeon: Rogene Houston, MD;  Location: [facility];  Service: Endoscopy;;  colon  . CARDIAC CATHETERIZATION    . CARDIAC CATHETERIZATION N/A 12/10/2015   Procedure: Right/Left Heart Cath and Coronary Angiography;  Surgeon: Peter M Martinique, MD;  Location: [place] [facility];  Service: Cardiovascular;  Laterality: N/A;  . CARDIAC DEFIBRILLATOR PLACEMENT     medtronic, remote - yes  . CATARACT EXTRACTION W/PHACO Left 08/25/2014   Procedure: CATARACT EXTRACTION PHACO AND INTRAOCULAR LENS PLACEMENT (IOC);  Surgeon: Tonny Branch, MD;  Location: [facility];  Service: Ophthalmology;  Laterality: Left;  CDE 5.79  . CATARACT EXTRACTION W/PHACO Right 09/18/2014   Procedure: CATARACT EXTRACTION PHACO AND INTRAOCULAR LENS PLACEMENT RIGHT EYE;  Surgeon: Tonny Branch, MD;  Location: [facility];  Service: Ophthalmology;  Laterality: Right;  CDE:3.35  . COLONOSCOPY N/A 12/20/2018   Procedure: COLONOSCOPY;  Surgeon: Rogene Houston, MD;  Location: [facility];  Service: Endoscopy;  Laterality: N/A;  730  . CORONARY STENT PLACEMENT    . EP IMPLANTABLE DEVICE N/A 05/03/2016   Procedure: BIV PPM Generator Changeout;  Surgeon: Evans Lance, MD;  Location: [place];  Service: Cardiovascular;  Laterality: N/A;  . ESOPHAGOSCOPY WITH DILITATION  12/20/2018   Procedure: ESOPHAGOSCOPY WITH DILITATION;  Surgeon: Rogene Houston, MD;  [facility];  Service: Endoscopy;;  . INGUINAL HERNIA REPAIR    . left breast biopsy    . POLYPECTOMY  12/20/2018   Procedure: POLYPECTOMY;  Surgeon: Rogene Houston, MD;  [facility]: [facility];  Service: Endoscopy;;  colon  . RETROPUBIC PROSTATECTOMY    .  THORACOTOMY     left  . THYROID SURGERY    . THYROIDECTOMY         Family History  Problem Relation Age of Onset  . Diabetes type I Sister        daughter   . Lung cancer Brother        several types of cancer  . Lung cancer Brother        several types of cancer  . Lung cancer Brother   . Clotting disorder Neg Hx        also no repiratory disease   . Prostate cancer Neg Hx     Social History   Tobacco Use  . Smoking status: Former Smoker    Packs/day: 1.00    Years: 43.00    Pack years: 43.00    Types: Cigarettes    Start date: 10/17/1957    Quit date: 12/13/2000    Years since quitting: 18.9  . Smokeless tobacco: Never Used  . Tobacco comment: started at age 59  Substance Use Topics  . Alcohol use: No    Alcohol/week: 0.0 standard drinks  . Drug use: No  Home Medications Prior to Admission medications   Medication Sig Start Date End Date Taking? Authorizing Provider  albuterol (PROVENTIL) (2.5 MG/3ML) 0.083% nebulizer solution Take 2.5 mg by nebulization daily.    [provider]  aspirin EC 81 MG tablet Take 1 tablet (81 mg total) by mouth at bedtime. 12/23/18   Rehman, Mechele Dawley, MD  bisoprolol (ZEBETA) 5 MG tablet Take 0.5 tablets (2.5 mg total) by mouth daily. 04/16/19   Larey Dresser, MD  Calcium Carb-Cholecalciferol (CALCIUM 500 + D3 PO) Take 1 tablet by mouth at bedtime.     [provider]  carbidopa-levodopa (SINEMET IR) 25-100 MG tablet Take 1 tablet by mouth 3 (three) times daily. 03/20/19   Marcial Pacas, MD  Cyanocobalamin (VITAMIN B-12 IJ) Inject 1,000 mcg as directed every 30 (thirty) days.     [provider]  donepezil (ARICEPT) 10 MG tablet Take 1 tablet (10 mg total) by mouth at bedtime. 03/20/19   Marcial Pacas, MD  eplerenone (INSPRA) 25 MG tablet Take 1 tablet (25 mg total) by mouth daily. Take at bed time Patient taking differently: Take 12.5 mg by mouth daily. Take at bed time 01/22/19   Larey Dresser, MD  FLUoxetine  (PROZAC) 10 MG capsule Take 20 mg by mouth at bedtime.  11/16/18   [provider]  fluticasone (FLONASE) 50 MCG/ACT nasal spray Place 1 spray into both nostrils daily as needed (for sinus issues.).  08/25/18   [provider]  furosemide (LASIX) 20 MG tablet Take 3 tablets (60 mg total) by mouth every morning. 06/25/19   Larey Dresser, MD  losartan (COZAAR) 25 MG tablet Take 12.5 mg by mouth daily.    [provider]  memantine (NAMENDA) 10 MG tablet Take 1 tablet (10 mg total) by mouth 2 (two) times daily. 03/20/19   Marcial Pacas, MD  pantoprazole (PROTONIX) 40 MG tablet Take 1 tablet (40 mg total) by mouth daily. 12/18/18   Orpah Greek, MD  Polyethyl Glycol-Propyl Glycol (LUBRICANT EYE DROPS) 0.4-0.3 % SOLN Place 1-2 drops into both eyes 2 (two) times daily as needed (for dry/irritated eyes.).    [provider]  potassium chloride SA (K-DUR,KLOR-CON) 20 MEQ tablet Take 1 tablet (20 mEq total) by mouth daily. 06/11/18   Larey Dresser, MD  rosuvastatin (CRESTOR) 10 MG tablet TAKE 1 TABLET BY MOUTH EVERY DAY 07/02/19   Larey Dresser, MD  SYNTHROID 112 MCG tablet Take 1 tablet by mouth daily. 02/18/19   [provider]    Allergies    Spironolactone  Review of Systems   Review of Systems  All other systems reviewed and are negative.   Physical Exam Updated Vital Signs BP (!) 157/99   Pulse 64   Temp 97.9 F (36.6 C) (Oral)   Resp (!) 24   Ht 5\' 9"  (1.753 m)   Wt 63.5 kg   SpO2 97%   BMI 20.67 kg/m   Physical Exam Vitals and nursing note reviewed.  Constitutional:      General: He is not in acute distress.    Appearance: He is well-developed.  HENT:     Head: Atraumatic.  Eyes:     Extraocular Movements: Extraocular movements intact.     Conjunctiva/sclera: Conjunctivae normal.     Pupils: Pupils are equal, round, and reactive to light.  Cardiovascular:     Rate and Rhythm: Normal rate and regular rhythm.     Pulses:  Normal pulses.  Heart sounds: Normal heart sounds.  Pulmonary:     Effort: Pulmonary effort is normal.     Breath sounds: Wheezing (Faint wheezes heard) present.  Abdominal:     Palpations: Abdomen is soft.     Tenderness: There is no abdominal tenderness.  Musculoskeletal:     Cervical back: Neck supple.  Skin:    Findings: No rash.  Neurological:     Mental Status: He is alert.     Comments: Neurologic exam:  Speech clear, pupils equal round reactive to light, difficulty following finger movement through visual field but EOMI Normal peripheral visual fields Cranial nerves III through XII normal including no facial droop Follows commands, moves all extremities x4, normal strength to bilateral upper and lower extremities at all major muscle groups including grip Sensation normal to light touch  Coordination intact, no limb ataxia, finger-nose-finger normal Rapid alternating movements normal No pronator drift Gait normal Alert to place, time, situation but mistakenly thought it was 2020 instead of 2021      ED Results / Procedures / Treatments   Labs (all labs ordered are listed, but only abnormal results are displayed) Labs Reviewed  TRIGLYCERIDES - Abnormal; Notable for the following components:      Result Value   Triglycerides 163 (*)    All other components within normal limits  C-REACTIVE PROTEIN - Abnormal; Notable for the following components:   CRP 1.1 (*)    All other components within normal limits  URINALYSIS, ROUTINE W REFLEX MICROSCOPIC - Abnormal; Notable for the following components:   Ketones, ur 5 (*)    All other components within normal limits  CULTURE, BLOOD (ROUTINE X 2)  CULTURE, BLOOD (ROUTINE X 2)  SARS CORONAVIRUS 2 (TAT 6-24 HRS)  LACTIC ACID, PLASMA  CBC WITH DIFFERENTIAL/PLATELET  COMPREHENSIVE METABOLIC PANEL  D-DIMER, QUANTITATIVE (NOT AT [facility])  PROCALCITONIN  LACTATE DEHYDROGENASE  FERRITIN  FIBRINOGEN  POC SARS CORONAVIRUS 2 AG  -  ED    EKG EKG Interpretation  Date/Time:  Sunday November 03 2019 22:31:15 EST Ventricular Rate:  70 PR Interval:    QRS Duration: 190 QT Interval:  515 QTC Calculation: 556 R Axis:   -100 Text Interpretation: Sinus rhythm RBBB and LAFB No significant change since last tracing Confirmed by Ripley Fraise [phone]) on 11/03/2019 11:26:48 PM   Radiology CT Head Wo Contrast  Result Date: 11/03/2019 CLINICAL DATA:  Altered mental status. Encephalopathy. Headache. Confusion. EXAM: CT HEAD WITHOUT CONTRAST TECHNIQUE: Contiguous axial images were obtained from the base of the skull through the vertex without intravenous contrast. COMPARISON:  Head CT 04/09/2019 FINDINGS: Brain: No intracranial hemorrhage, mass effect, or midline shift. Stable degree of atrophy from prior exam. No hydrocephalus. The basilar cisterns are patent. Mild chronic small vessel ischemia. No evidence of territorial infarct or acute ischemia. No extra-axial or intracranial fluid collection. Vascular: Atherosclerosis of skullbase vasculature without hyperdense vessel or abnormal calcification. Skull: No fracture or focal lesion. Sinuses/Orbits: Bilateral lens extraction. No acute findings. Other: None. IMPRESSION: 1. No acute intracranial abnormality. 2. Stable atrophy and chronic small vessel ischemia. Electronically Signed   By: Keith Rake M.D.   On: 11/03/2019 23:13   DG Chest Port 1 View  Result Date: 11/03/2019 CLINICAL DATA:  79 year old male with shortness of breath. EXAM: PORTABLE CHEST 1 VIEW COMPARISON:  Chest radiograph dated 05/15/2019. FINDINGS: There is no focal consolidation, pleural effusion or pneumothorax. Stable mild cardiomegaly. Atherosclerotic calcification of the aorta. Left pectoral AICD device. No acute osseous pathology.  IMPRESSION: No acute cardiopulmonary process.  No interval change. Electronically Signed   By: Anner Crete M.D.   On: 11/03/2019 23:13    Procedures Procedures  (including critical care time)  Medications Ordered in ED Medications  0.9 %  sodium chloride infusion (1,000 mLs Intravenous New Bag/Given 11/03/19 2243)  albuterol (VENTOLIN HFA) 108 (90 Base) MCG/ACT inhaler 4 puff (has no administration in time range)    ED Course  I have reviewed the triage vital signs and the nursing notes.  Pertinent labs & imaging results that were available during my care of the patient were reviewed by me and considered in my medical decision making (see chart for details).    MDM Rules/Calculators/A&P                      BP (!) 157/99   Pulse 64   Temp 97.9 F (36.6 C) (Oral)   Resp (!) 24   Ht 5\' 9"  (1.753 m)   Wt 63.5 kg   SpO2 97%   BMI 20.67 kg/m   Final Clinical Impression(s) / ED Diagnoses Final diagnoses:  Confusion  Suspected COVID-19 virus infection    Rx / DC Orders ED Discharge Orders    None     10:33 PM Elderly male with history of Lewy body dementia here with altered mental status from baseline.  He also has some increased wheezing shortness of breath and occasional cough according to daughter.  Recently had a Covid vaccine 3 days ago.  In the setting of increased confusion and cold symptoms, will obtain COVID-19 test to rule out underlying infections causing delirium.  Did report some headache yesterday therefore I will obtain head CT scan.  Care discussed with Dr. Lacinda Axon.  11:21 PM Rapid Covid test is negative UA without signs of urinary tract infection, labs are reassuring, normal inflammatory markers, chest x-ray without acute finding, head CT scan unremarkable, EKG is reassuring.  Patient does not have any focal neuro deficit on exam.  He does have history of Lewy body dementia therefore it is difficult to fully appreciate if his altered mental status is significant.  Since patient does have wheezing, occasional cough and potential for COVID-19, will obtain COVID-19 PCR.  However he is not hypoxic and does not criteria for  admission.  Low suspicion for acute stroke.  11:52 PM UA negative for infection.  Patient received IV fluid.  At this time patient is stable for discharge.  COVID-19 PCR is currently pending.  Return precaution discussed.  MOHNISH MCGLYNN was evaluated in Emergency Department on 11/03/2019 for the symptoms described in the history of present illness. He was evaluated in the context of the global COVID-19 pandemic, which necessitated consideration that the patient might be at risk for infection with the SARS-CoV-2 virus that causes COVID-19. Institutional protocols and algorithms that pertain to the evaluation of patients at risk for COVID-19 are in a state of rapid change based on information released by regulatory bodies including the CDC and federal and state organizations. These policies and algorithms were followed during the patient's care in the ED.    Domenic Moras, PA-C 11/03/19 2356    Nat Christen, MD 11/08/19 1335

## 2019-11-03 NOTE — ED Triage Notes (Signed)
Pt arrived to er with family for increase in confusion that started during the night, family reports that pt has intermittent confusion but last night the confusion started getting worse.

## 2019-11-04 LAB — SARS CORONAVIRUS 2 (TAT 6-24 HRS): SARS Coronavirus 2: NEGATIVE

## 2019-11-04 NOTE — Progress Notes (Signed)
PATIENT: [name] DOB: Aug 10, 1941  REASON FOR VISIT: follow up HISTORY FROM: patient  Virtual Visit via Telephone Note  I connected with [name] on 11/05/19 at 11:30 AM EST by telephone and verified that I am speaking with the correct person using two identifiers.   I discussed the limitations, risks, security and privacy concerns of performing an evaluation and management service by telephone and the availability of in person appointments. I also discussed with the patient that there may be a patient responsible charge related to this service. The patient expressed understanding and agreed to proceed.   History of Present Illness:  History (copied from [name] note on 03/20/2019)  [name] is a 79 year old male, seen in request by his cardiologist [name] and his primary care physician [name], [name] for evaluation of altered mental status, initial evaluation was on July 02, 2018.  I have reviewed and summarized the referring note from the referring physician.  He has past medical history of hypertension, hyperlipidemia, atrial tachycardia, status post ablation in January 2019, COPD, congestive heart failure, he also has pacemaker, is not an MRI candidate,  Since his ablation surgery in January 2019, he was noted to have worsening confusion, he complains of feeling fatigue, do not have energy over doing something, also has worsening anxiety, tends to become confused, lost train of thoughts during the middle of the conversation, he also become much less active, always moving slow, now has mild worsening gait abnormality, sometimes he went to the kitchen try to fixing a cup of coffee, but could not remember where he laid his cup, he also has hard of hearing  Personally reviewed CT angiogram of head in February 2019, atherosclerotic calcification, mild stenosis in the carotid siphon bilaterally, and in the distal vertebral artery bilaterally, otherwise no  significant intracranial stenosis.  Atrophy and chronic microvascular ischemic change in the white matter.  We reviewed laboratory evaluation in September 2019, normal TSH, free T4, BMP showed no significant abnormality, B12 in February 2019 was mildly decreased 218, he was given vitamin B12 IM supplement, which has helped him some,  He denies a family history of dementia, he is not taking aspirin daily  UPDATE March 20 2019: He is accompanied by his daughter [name] at today's clinical visit, he sits in wheelchair, his wife is on phone,  He is noted to have increased blurry vision, confusion, gait abnormality,  Daughter also reported that he has REM sleep disorder, acting out of dreams, daytime evaluations, sleep most of the day, nighttime visual hallucinations  We again personally reviewed CT head without contrast February 2019, generalized atrophy especially of the bilateral frontotemporal region, no acute abnormality  Laboratory evaluations in 2020 showed normal CBC, TSH, BMP   11/05/19 [name] is a 79 y.o. male here today for follow up for Lewy Body Dementia.  He is present on MyChart video today with his wife and daughter who aids in HPI.  His daughter reports that [name] has shown some progression of dementia.  He seems more confused lately.  Confusion is definitely worse at night.  He does seem to be draining more often.  He does wake frightened occasionally.  He does have continued hallucinations.  He does not always sleep well.  His daughter reports that she had to come over the last week as [name] felt that he was at church when he was actually at home.  He continues to live at home with  his wife.  She feels that she is able to care for him at this time.  His daughter is caring for her in-laws but is always available to help her mom.  He does not drive.  Orientation status waxes and wanes. He is eating normally. He is able to perform ADLs with minimal assistance.  No  falls.  His daughter reports that they did have to stop Namenda as they felt that symptoms were worse on this medication.  He does continue Aricept 5 mg.  They have tried to titrate dose to 10 mg but reports GI distress and vomiting.  He is doing well with carbidopa levodopa.  He is currently taking this medication 3 times daily.  They do feel that tremor is slightly worse.  He was evaluated by ER last week due to worsening confusion. Workup unremarkable.    Observations/Objective:  Generalized: Well developed, in no acute distress  Mentation: Alert, not oriented to time, but is oriented to place, does not provide assistance with history taking. Follows all commands speech and language fluent. No facial asymmetry noted. Able to move extremities freely. Tremor noted of bilateral upper extremities, worse on right.    Assessment and Plan:  79 y.o. year old male  has a past medical history of AICD (automatic cardioverter/defibrillator) present, Aortic insufficiency, Carotid artery disease ([place]), Carotid artery disease ([place]), Chronic systolic CHF (congestive heart failure) ([place]), Coronary artery disease, Drug-induced gynecomastia, Dyslipidemia, Hypertension, ICD (implantable cardiac defibrillator), biventricular, in situ, Ischemic cardiomyopathy, LV dysfunction, PONV (postoperative nausea and vomiting), Pulmonary embolism ([place]), and Ventricular tachycardia ([place]). here with    ICD-10-CM   1. Dementia with Lewy bodies (CODE) ([place])  G31.83   2. SunDown syndrome  F05   3. Hallucination  R44.3    Overall, [name] is doing fairly well.  Family has noted progression of dementia.  He does seem a bit more restless and agitated, specifically in the evenings.  He is not resting as well as he used to.  I have discussed several options for treatment with his daughter and wife.  We have discussed trying a low dose benzodiazepine at night to help him rest versus a low dose of Seroquel.  We have discussed the  possibility of these medications making symptoms worse.  We will need to monitor very closely.  Family wishes to discuss options prior to starting any medications.  Tremor does seem to be worsening slightly.  He is doing well with carbidopa levodopa.  We will increase this dose to 4 times daily.  His wife and daughter were instructed on appropriate timing and potential side effects of medication.  Daughter was provided with additional resources related to Lewy body dementia.  We have discussed progression of disease.  His wife feels capable of caring for him at this time.  She was instructed to call us should any concerns arise.  She will continue supportive care.  I have advised that they follow-up with me in 3 months, sooner if needed.  Family verbalizes understanding and agreement with this plan.   No orders of the defined types were placed in this encounter.   Meds ordered this encounter  Medications  . carbidopa-levodopa (SINEMET IR) 25-100 MG tablet    Sig: Take 1 tablet by mouth 4 (four) times daily.    Dispense:  120 tablet    Refill:  11    Order Specific Question:   Supervising Provider    Answer:   Melvenia Beam [id number]  Follow Up Instructions:  I discussed the assessment and treatment plan with the patient. The patient was provided an opportunity to ask questions and all were answered. The patient agreed with the plan and demonstrated an understanding of the instructions.   The patient was advised to call back or seek an in-person evaluation if the symptoms worsen or if the condition fails to improve as anticipated.  I provided 20 minutes of non-face-to-face time during this encounter.  Patient is located at his place of residence during my chart video visit.  He is present with his daughter and wife today who aid in HPI.  Provider is in the office.   Debbora Presto, NP

## 2019-11-05 ENCOUNTER — Telehealth (INDEPENDENT_AMBULATORY_CARE_PROVIDER_SITE_OTHER): Payer: Medicare Other | Admitting: Family Medicine

## 2019-11-05 ENCOUNTER — Encounter: Payer: Self-pay | Admitting: Family Medicine

## 2019-11-05 DIAGNOSIS — G3183 Dementia with Lewy bodies: Secondary | ICD-10-CM | POA: Diagnosis not present

## 2019-11-05 DIAGNOSIS — F05 Delirium due to known physiological condition: Secondary | ICD-10-CM | POA: Diagnosis not present

## 2019-11-05 DIAGNOSIS — R443 Hallucinations, unspecified: Secondary | ICD-10-CM | POA: Diagnosis not present

## 2019-11-05 MED ORDER — CARBIDOPA-LEVODOPA 25-100 MG PO TABS: 1.0000 | ORAL_TABLET | Freq: Four times a day (QID) | ORAL | 11 refills | Status: AC

## 2019-11-05 NOTE — Patient Instructions (Signed)
We will increase Sinemet to 4 tablets daily. Give tablets 3-4 hours apart.   Discuss options for treatment of sundowning symptoms with your family. I have provided information on each medication below. We will use lowest effective dose but would need to monitor symptoms closely for worsening.   I would like to see [name] back in 3 months. Can schedule with me or Dr Krista Blue.    Dementia Caregiver Guide Dementia is a term used to describe a number of symptoms that affect memory and thinking. The most common symptoms include:  Memory loss.  Trouble with language and communication.  Trouble concentrating.  Poor judgment.  Problems with reasoning.  Child-like behavior and language.  Extreme anxiety.  Angry outbursts.  Wandering from home or public places. Dementia usually gets worse slowly over time. In the early stages, people with dementia can stay independent and safe with some help. In later stages, they need help with daily tasks such as dressing, grooming, and using the bathroom. How to help the person with dementia cope Dementia can be frightening and confusing. Here are some tips to help the person with dementia cope with changes caused by the disease. General tips  Keep the person on track with his or her routine.  Try to identify areas where the person may need help.  Be supportive, patient, calm, and encouraging.  Gently remind the person that adjusting to changes takes time.  Help with the tasks that the person has asked for help with.  Keep the person involved in daily tasks and decisions as much as possible.  Encourage conversation, but try not to get frustrated or harried if the person struggles to find words or does not seem to appreciate your help. Communication tips  When the person is talking or seems frustrated, make eye contact and hold the person's hand.  Ask specific questions that need yes or no answers.  Use simple words, short sentences, and a  calm voice. Only give one direction at a time.  When offering choices, limit them to just 1 or 2.  Avoid correcting the person in a negative way.  If the person is struggling to find the right words, gently try to help him or her. How to recognize symptoms of stress Symptoms of stress in caregivers include:  Feeling frustrated or angry with the person with dementia.  Denying that the person has dementia or that his or her symptoms will not improve.  Feeling hopeless and unappreciated.  Difficulty sleeping.  Difficulty concentrating.  Feeling anxious, irritable, or depressed.  Developing stress-related health problems.  Feeling like you have too little time for your own life. Follow these instructions at home:   Make sure that you and the person you are caring for: ? Get regular sleep. ? Exercise regularly. ? Eat regular, nutritious meals. ? Drink enough fluid to keep your urine clear or pale yellow. ? Take over-the-counter and prescription medicines only as told by your health care providers. ? Attend all scheduled health care appointments.  Join a support group with others who are caregivers.  Ask about respite care resources so that you can have a regular break from the stress of caregiving.  Look for signs of stress in yourself and in the person you are caring for. If you notice signs of stress, take steps to manage it.  Consider any safety risks and take steps to avoid them.  Organize medications in a pill box for each day of the week.  Create a  plan to handle any legal or financial matters. Get legal or financial advice if needed.  Keep a calendar in a central location to remind the person of appointments or other activities. Tips for reducing the risk of injury  Keep floors clear of clutter. Remove rugs, magazine racks, and floor lamps.  Keep hallways well lit, especially at night.  Put a handrail and nonslip mat in the bathtub or shower.  Put childproof  locks on cabinets that contain dangerous items, such as medicines, alcohol, guns, toxic cleaning items, sharp tools or utensils, matches, and lighters.  Put the locks in places where the person cannot see or reach them easily. This will help ensure that the person does not wander out of the house and get lost.  Be prepared for emergencies. Keep a list of emergency phone numbers and addresses in a convenient area.  Remove car keys and lock garage doors so that the person does not try to get in the car and drive.  Have the person wear a bracelet that tracks locations and identifies the person as having memory problems. This should be worn at all times for safety. Where to find support: Many individuals and organizations offer support. These include:  Support groups for people with dementia and for caregivers.  Counselors or therapists.  Home health care services.  Adult day care centers. Where to find more information Alzheimer[employer]: [employer] Contact a health care provider if:  The person's health is rapidly getting worse.  You are no longer able to care for the person.  Caring for the person is affecting your physical and emotional health.  The person threatens himself or herself, you, or anyone else. Summary  Dementia is a term used to describe a number of symptoms that affect memory and thinking.  Dementia usually gets worse slowly over time.  Take steps to reduce the person's risk of injury, and to plan for future care.  Caregivers need support, relief from caregiving, and time for their own lives. This information is not intended to replace advice given to you by your health care provider. Make sure you discuss any questions you have with your health care provider. Document Revised: 09/15/2017 Document Reviewed: 09/06/2016 Elsevier Patient Education  [place].   Lewy Body Dementia Lewy body dementia, also called dementia with Lewy bodies, is a  condition that affects the way the brain functions. It is one form of dementia. In this condition, proteins called Lewy bodies build up in certain areas of the brain. This causes problems with:  Memory.  Decision making.  Behavior.  Speaking.  Thinking.  Movements and balance.  Problem solving. This condition is progressive, which means that it gets worse with time (is degenerative) and cannot be reversed. What are the causes? This condition is caused by the buildup of Lewy bodies in brain cells in areas of the brain that control memory, thinking, and movement. It is not known what causes the Lewy bodies to build up. What increases the risk? You are more likely to develop this condition if you:  Have a family history of Lewy body dementia or Parkinson's disease.  Are 73 years old or older.  Are male. What are the signs or symptoms? Symptoms of this condition may include:  Symptoms of dementia, such as: ? Trouble with memory. ? Trouble paying attention. ? Problems with planning and organizing. ? Problems with judgment. ? Behavioral problems.  Symptoms of Parkinson's disease, such as: ? Shaking movements that you cannot  control (tremor). Tremors usually start in a hand or foot when you are resting (resting tremor). ? Stooped posture. ? Slowing of movement. ? Stiff muscles (rigidity). ? Loss of balance and stability when standing.  Seeing things that are not there (hallucinating).  Changes in memory, attention, and concentration that come and go (fluctuation).  Sleep problems, such as acting out dreams while you are asleep. How is this diagnosed? This condition is diagnosed by a specialist who diagnoses and treats this condition (neurologist). Your health care provider will talk with you and your family, friends, or caregivers about your history and symptoms. A thorough medical history will be taken, and you will have a physical exam and tests. Tests may include:  Lab  tests, such as blood or urine tests.  Imaging tests, such as a CT scan, a PET scan, or an MRI.  A test that involves removing and testing a small amount of the fluid that surrounds the brain and spinal cord (lumbar puncture).  A test where small metal discs are used to measure electrical activity in the brain (electroencephalogram or EEG).  Tests that evaluate brain function, such as memory tests, cognitive tests, and neuropsychological tests. How is this treated? There is no cure for this condition. Treatment focuses on managing your symptoms. Treatment may include:  Medicines. Everyone responds to medicines differently. Your response may change over time. Work with your health care provider to find the best medicines for you.  Speech, occupational, and physical therapy. Your health care provider can help direct you to support groups, organizations, and other health care providers who can help with decisions about your care. Follow these instructions at home: Medicine  Take over-the-counter and prescription medicines only as told by your health care provider.  To help you manage your medicines, use a pill organizer or pill reminder.  Avoid taking medicines that can affect thinking, such as pain medicines or sleeping medicines. Lifestyle  Make healthy lifestyle choices: ? Be physically active as told by your health care provider. ? Do not use any products that contain nicotine or tobacco, such as cigarettes, e-cigarettes, and chewing tobacco. If you need help quitting, ask your health care provider. ? Try to practice stress-management techniques when you experience stress, such as mindfulness, yoga, or deep breathing. ? Stay socially connected. Talk regularly with other people, such as family, friends, and neighbors.  Make sure you sleep well. These tips can help you get a good night's rest: ? Avoid napping during the day. ? Keep your sleeping area dark and cool. ? Avoid exercising  a few hours before you go to bed. ? Avoid caffeine products in the evening. Eating and drinking  Do not drink alcohol.  Drink enough fluid to keep your urine pale yellow.  Eat a healthy diet. Safety      Work with your health care provider to determine what you need help with and what your safety needs are.  If you have trouble moving around, use a cane or walker as told by your health care provider.  Make sure your home environment is safe. To do this: ? Remove things that can be a tripping hazard, such as throw rugs or clutter. ? Install grab bars and railings in your home to prevent falls.  Talk with your health care provider about if it is safe for you to drive.  If you were given a bracelet that identifies you as a person with memory loss or tracks your location, make sure to  wear it at all times. General instructions  Work with your family to make important decisions, such as advance directives, medical power of attorney, or a living will.  Keep all follow-up visits as told by your health care provider. This is important. Contact a health care provider if you have:  A fever.  Problems with choking or swallowing.  Any symptoms of a new or different illness.  New or worsening trouble with sleeping or increased daytime sleepiness.  New or worsening confusion. Get help right away if:  You feel depressed, sad, or feel that you want to harm yourself.  Your family members become concerned for your safety. If you ever feel like you may hurt yourself or others, or have thoughts about taking your own life, get help right away. You can go to your nearest emergency department or call:  Your local emergency services (911 in the [place]  A suicide crisis helpline, such as the [facility] at 414-374-1189. This is open 24 hours a day. Summary  Dementia is a condition that affects the way the brain functions. It often affects memory and  thinking.  Lewy body dementia is a degenerative dementia.  This condition is caused by the buildup of proteins called Lewy bodies in brain cells. It is not known what causes the Lewy bodies to build up.  Work with your health care provider to determine what you need help with and what your safety needs are.  Your health care provider can help direct you to support groups, organizations, and other health care providers who can help with decisions about your care. This information is not intended to replace advice given to you by your health care provider. Make sure you discuss any questions you have with your health care provider. Document Revised: 11/29/2018 Document Reviewed: 11/29/2018 Elsevier Patient Education  [place].   Alprazolam tablets What is this medicine? ALPRAZOLAM (al PRAY zoe lam) is a benzodiazepine. It is used to treat anxiety and panic attacks. This medicine may be used for other purposes; ask your health care provider or pharmacist if you have questions. COMMON BRAND NAME(S): Xanax What should I tell my health care provider before I take this medicine? They need to know if you have any of these conditions:  an alcohol or drug abuse problem  bipolar disorder, depression, psychosis or other mental health conditions  glaucoma  kidney or liver disease  lung or breathing disease  myasthenia gravis  Parkinson's disease  porphyria  seizures or a history of seizures  suicidal thoughts  an unusual or allergic reaction to alprazolam, other benzodiazepines, foods, dyes, or preservatives  pregnant or trying to get pregnant  breast-feeding How should I use this medicine? Take this medicine by mouth with a glass of water. Follow the directions on the prescription label. Take your medicine at regular intervals. Do not take it more often than directed. Do not stop taking except on your doctor's advice. A special MedGuide will be given to you by the  pharmacist with each prescription and refill. Be sure to read this information carefully each time. Talk to your pediatrician regarding the use of this medicine in children. Special care may be needed. Overdosage: If you think you have taken too much of this medicine contact a poison control center or emergency room at once. NOTE: This medicine is only for you. Do not share this medicine with others. What if I miss a dose? If you miss a dose, take it as  soon as you can. If it is almost time for your next dose, take only that dose. Do not take double or extra doses. What may interact with this medicine? Do not take this medicine with any of the following medications:  certain antiviral medicines for HIV or AIDS like delavirdine, indinavir  certain medicines for fungal infections like ketoconazole and itraconazole  narcotic medicines for cough  sodium oxybate This medicine may also interact with the following medications:  alcohol  antihistamines for allergy, cough and cold  certain antibiotics like clarithromycin, erythromycin, isoniazid, rifampin, rifapentine, rifabutin, and troleandomycin  certain medicines for blood pressure, heart disease, irregular heart beat  certain medicines for depression, like amitriptyline, fluoxetine, sertraline  certain medicines for seizures like carbamazepine, oxcarbazepine, phenobarbital, phenytoin, primidone  cimetidine  cyclosporine  male hormones, like estrogens or progestins and birth control pills, patches, rings, or injections  general anesthetics like halothane, isoflurane, methoxyflurane, propofol  grapefruit juice  local anesthetics like lidocaine, pramoxine, tetracaine  medicines that relax muscles for surgery  narcotic medicines for pain  other antiviral medicines for HIV or AIDS  phenothiazines like chlorpromazine, mesoridazine, prochlorperazine, thioridazine This list may not describe all possible interactions. Give your  health care provider a list of all the medicines, herbs, non-prescription drugs, or dietary supplements you use. Also tell them if you smoke, drink alcohol, or use illegal drugs. Some items may interact with your medicine. What should I watch for while using this medicine? Tell your doctor or health care professional if your symptoms do not start to get better or if they get worse. Do not stop taking except on your doctor's advice. You may develop a severe reaction. Your doctor will tell you how much medicine to take. You may get drowsy or dizzy. Do not drive, use machinery, or do anything that needs mental alertness until you know how this medicine affects you. To reduce the risk of dizzy and fainting spells, do not stand or sit up quickly, especially if you are an older patient. Alcohol may increase dizziness and drowsiness. Avoid alcoholic drinks. If you are taking another medicine that also causes drowsiness, you may have more side effects. Give your health care provider a list of all medicines you use. Your doctor will tell you how much medicine to take. Do not take more medicine than directed. Call emergency for help if you have problems breathing or unusual sleepiness. What side effects may I notice from receiving this medicine? Side effects that you should report to your doctor or health care professional as soon as possible:  allergic reactions like skin rash, itching or hives, swelling of the face, lips, or tongue  breathing problems  confusion  loss of balance or coordination  signs and symptoms of low blood pressure like dizziness; feeling faint or lightheaded, falls; unusually weak or tired  suicidal thoughts or other mood changes Side effects that usually do not require medical attention (report to your doctor or health care professional if they continue or are bothersome):  dizziness  dry mouth  nausea, vomiting  tiredness This list may not describe all possible side  effects. Call your doctor for medical advice about side effects. You may report side effects to FDA at 1-800-FDA-1088. Where should I keep my medicine? Keep out of the reach of children. This medicine can be abused. Keep your medicine in a safe place to protect it from theft. Do not share this medicine with anyone. Selling or giving away this medicine is dangerous and against  the law. Store at room temperature between 20 and 25 degrees C (68 and 77 degrees F). This medicine may cause accidental overdose and death if taken by other adults, children, or pets. Mix any unused medicine with a substance like cat litter or coffee grounds. Then throw the medicine away in a sealed container like a sealed bag or a coffee can with a lid. Do not use the medicine after the expiration date. NOTE: This sheet is a summary. It may not cover all possible information. If you have questions about this medicine, talk to your doctor, pharmacist, or health care provider.  2020 Elsevier/Gold Standard (2015-07-02 13:47:25)   Quetiapine tablets What is this medicine? QUETIAPINE (kwe TYE a peen) is an antipsychotic. It is used to treat schizophrenia and bipolar disorder, also known as manic-depression. This medicine may be used for other purposes; ask your health care provider or pharmacist if you have questions. COMMON BRAND NAME(S): Seroquel What should I tell my health care provider before I take this medicine? They need to know if you have any of these conditions:  blockage in your bowel  cataracts  constipation  dehydration  diabetes  difficulty swallowing  glaucoma  heart disease  history of breast cancer  kidney disease  liver disease  low blood counts, like low white cell, platelet, or red cell counts  low blood pressure or dizziness when standing up  Parkinson's disease  previous heart attack  prostate disease  seizures  stomach or intestine problems  suicidal thoughts, plans or  attempt; a previous suicide attempt by you or a family member  thyroid disease  trouble passing urine  an unusual or allergic reaction to quetiapine, other medicines, foods, dyes, or preservatives  pregnant or trying to get pregnant  breast-feeding How should I use this medicine? Take this medicine by mouth. Swallow it with a drink of water. Follow the directions on the prescription label. If it upsets your stomach you can take it with food. Take your medicine at regular intervals. Do not take it more often than directed. Do not stop taking except on the advice of your doctor or health care professional. A special MedGuide will be given to you by the pharmacist with each prescription and refill. Be sure to read this information carefully each time. Talk to your pediatrician regarding the use of this medicine in children. While this drug may be prescribed for children as young as 10 years for selected conditions, precautions do apply. Patients over age 60 years may have a stronger reaction to this medicine and need smaller doses. Overdosage: If you think you have taken too much of this medicine contact a poison control center or emergency room at once. NOTE: This medicine is only for you. Do not share this medicine with others. What if I miss a dose? If you miss a dose, take it as soon as you can. If it is almost time for your next dose, take only that dose. Do not take double or extra doses. What may interact with this medicine? Do not take this medicine with any of the following medications:  cisapride  dronedarone  fluconazole  metoclopramide  pimozide  posaconazole  thioridazine This medicine may also interact with the following medications:  alcohol  antihistamines for allergy cough and cold  antiviral medicines for HIV or AIDS  atropine  certain medicines for bladder problems like oxybutynin, tolterodine  certain medicines for blood pressure  certain medicines  for depression, anxiety, or psychotic disturbances  certain medicines for diabetes  certain medicines for stomach problems like dicyclomine, hyoscyamine  certain medicines for travel sickness like scopolamine  certain medicines for Parkinson's disease  certain medicines for seizures like carbamazepine, phenobarbital, phenytoin  cimetidine  erythromycin  ipratropium  other medicines that prolong the QT interval (cause an abnormal heart rhythm) like dofetilide  rifampin  steroid medicines like prednisone or cortisone This list may not describe all possible interactions. Give your health care provider a list of all the medicines, herbs, non-prescription drugs, or dietary supplements you use. Also tell them if you smoke, drink alcohol, or use illegal drugs. Some items may interact with your medicine. What should I watch for while using this medicine? Visit your health care professional for regular checks on your progress. Tell your health care professional if symptoms do not start to get better or if they get worse. Do not stop taking except on your health care professional's advice. You may develop a severe reaction. Your health care professional will tell you how much medicine to take. You may need to have an eye exam before and during use of this medicine. This medicine may increase blood sugar. Ask your health care provider if changes in diet or medicines are needed if you have diabetes. Patients and their families should watch out for new or worsening depression or thoughts of suicide. Also watch out for sudden or severe changes in feelings such as feeling anxious, agitated, panicky, irritable, hostile, aggressive, impulsive, severely restless, overly excited and hyperactive, or not being able to sleep. If this happens, especially at the beginning of antidepressant treatment or after a change in dose, call your health care professional. Dennis Bast may get dizzy or drowsy. Do not drive, use  machinery, or do anything that needs mental alertness until you know how this medicine affects you. Do not stand or sit up quickly, especially if you are an older patient. This reduces the risk of dizzy or fainting spells. Alcohol may interfere with the effect of this medicine. Avoid alcoholic drinks. This drug can cause problems with controlling your body temperature. It can lower the response of your body to cold temperatures. If possible, stay indoors during cold weather. If you must go outdoors, wear warm clothes. It can also lower the response of your body to heat. Do not overheat. Do not over-exercise. Stay out of the sun when possible. If you must be in the sun, wear cool clothing. Drink plenty of water. If you have trouble controlling your body temperature, call your health care provider right away. What side effects may I notice from receiving this medicine? Side effects that you should report to your doctor or health care professional as soon as possible:  allergic reactions like skin rash, itching or hives, swelling of the face, lips, or tongue  breathing problems  changes in vision  confusion  elevated mood, decreased need for sleep, racing thoughts, impulsive behavior  eye pain  fast, irregular heartbeat  fever or chills, sore throat  inability to keep still  males: prolonged or painful erection  problems with balance, talking, walking  redness, blistering, peeling, or loosening of the skin, including inside the mouth  seizures  signs and symptoms of high blood sugar such as being more thirsty or hungry or having to urinate more than normal. You may also feel very tired or have blurry vision  signs and symptoms of hypothyroidism like fatigue; increased sensitivity to cold; weight gain; hoarseness; thinning hair  signs and symptoms of low  blood pressure like dizziness; feeling faint or lightheaded; falls; unusually weak or tired  signs and symptoms of neuroleptic  malignant syndrome like confusion; fast, irregular heartbeat; high fever; increased sweating; stiff muscles  sudden numbness or weakness of the face, arm, or leg  suicidal thoughts or other mood changes  trouble swallowing  uncontrollable movements of the arms, face, head, mouth, neck, or upper body Side effects that usually do not require medical attention (report to your doctor or health care professional if they continue or are bothersome):  change in sex drive or performance  constipation  drowsiness  dry mouth  upset stomach  weight gain This list may not describe all possible side effects. Call your doctor for medical advice about side effects. You may report side effects to FDA at 1-800-FDA-1088. Where should I keep my medicine? Keep out of the reach of children. Store at room temperature between 15 and 30 degrees C (59 and 86 degrees F). Throw away any unused medicine after the expiration date. NOTE: This sheet is a summary. It may not cover all possible information. If you have questions about this medicine, talk to your doctor, pharmacist, or health care provider.  2020 Elsevier/Gold Standard (2019-07-31 11:59:11)

## 2019-11-08 LAB — CULTURE, BLOOD (ROUTINE X 2)
Culture: NO GROWTH
Culture: NO GROWTH
Special Requests: ADEQUATE
Special Requests: ADEQUATE

## 2019-11-11 DIAGNOSIS — M159 Polyosteoarthritis, unspecified: Secondary | ICD-10-CM | POA: Diagnosis not present

## 2019-11-11 DIAGNOSIS — J449 Chronic obstructive pulmonary disease, unspecified: Secondary | ICD-10-CM | POA: Diagnosis not present

## 2019-11-11 DIAGNOSIS — I2510 Atherosclerotic heart disease of native coronary artery without angina pectoris: Secondary | ICD-10-CM | POA: Diagnosis not present

## 2019-11-11 DIAGNOSIS — E7800 Pure hypercholesterolemia, unspecified: Secondary | ICD-10-CM | POA: Diagnosis not present

## 2019-11-12 DIAGNOSIS — E039 Hypothyroidism, unspecified: Secondary | ICD-10-CM | POA: Diagnosis not present

## 2019-11-12 DIAGNOSIS — Z6822 Body mass index (BMI) 22.0-22.9, adult: Secondary | ICD-10-CM | POA: Diagnosis not present

## 2019-11-12 DIAGNOSIS — Z0000 Encounter for general adult medical examination without abnormal findings: Secondary | ICD-10-CM | POA: Diagnosis not present

## 2019-11-12 DIAGNOSIS — Z7189 Other specified counseling: Secondary | ICD-10-CM | POA: Diagnosis not present

## 2019-11-12 DIAGNOSIS — Z79899 Other long term (current) drug therapy: Secondary | ICD-10-CM | POA: Diagnosis not present

## 2019-11-12 DIAGNOSIS — Z1211 Encounter for screening for malignant neoplasm of colon: Secondary | ICD-10-CM | POA: Diagnosis not present

## 2019-11-12 DIAGNOSIS — I2510 Atherosclerotic heart disease of native coronary artery without angina pectoris: Secondary | ICD-10-CM | POA: Diagnosis not present

## 2019-11-12 DIAGNOSIS — I10 Essential (primary) hypertension: Secondary | ICD-10-CM | POA: Diagnosis not present

## 2019-11-12 DIAGNOSIS — R5383 Other fatigue: Secondary | ICD-10-CM | POA: Diagnosis not present

## 2019-11-12 DIAGNOSIS — Z299 Encounter for prophylactic measures, unspecified: Secondary | ICD-10-CM | POA: Diagnosis not present

## 2019-11-15 DIAGNOSIS — J449 Chronic obstructive pulmonary disease, unspecified: Secondary | ICD-10-CM | POA: Diagnosis not present

## 2019-11-19 NOTE — Progress Notes (Signed)
I have reviewed and agreed above plan. 

## 2019-12-15 DIAGNOSIS — J449 Chronic obstructive pulmonary disease, unspecified: Secondary | ICD-10-CM | POA: Diagnosis not present

## 2019-12-17 DIAGNOSIS — M159 Polyosteoarthritis, unspecified: Secondary | ICD-10-CM | POA: Diagnosis not present

## 2019-12-17 DIAGNOSIS — J449 Chronic obstructive pulmonary disease, unspecified: Secondary | ICD-10-CM | POA: Diagnosis not present

## 2019-12-17 DIAGNOSIS — E7800 Pure hypercholesterolemia, unspecified: Secondary | ICD-10-CM | POA: Diagnosis not present

## 2019-12-17 DIAGNOSIS — I2510 Atherosclerotic heart disease of native coronary artery without angina pectoris: Secondary | ICD-10-CM | POA: Diagnosis not present

## 2019-12-17 DIAGNOSIS — E039 Hypothyroidism, unspecified: Secondary | ICD-10-CM | POA: Diagnosis not present

## 2019-12-25 DIAGNOSIS — J449 Chronic obstructive pulmonary disease, unspecified: Secondary | ICD-10-CM | POA: Diagnosis not present

## 2019-12-25 DIAGNOSIS — I10 Essential (primary) hypertension: Secondary | ICD-10-CM | POA: Diagnosis not present

## 2019-12-25 DIAGNOSIS — Z299 Encounter for prophylactic measures, unspecified: Secondary | ICD-10-CM | POA: Diagnosis not present

## 2019-12-30 ENCOUNTER — Other Ambulatory Visit (HOSPITAL_COMMUNITY): Payer: Self-pay | Admitting: Cardiology

## 2019-12-30 DIAGNOSIS — I5022 Chronic systolic (congestive) heart failure: Secondary | ICD-10-CM

## 2019-12-31 NOTE — Telephone Encounter (Signed)
This is a CHF pt 

## 2020-01-07 ENCOUNTER — Ambulatory Visit (INDEPENDENT_AMBULATORY_CARE_PROVIDER_SITE_OTHER): Payer: Medicare Other | Admitting: *Deleted

## 2020-01-07 DIAGNOSIS — I472 Ventricular tachycardia, unspecified: Secondary | ICD-10-CM

## 2020-01-07 LAB — CUP PACEART REMOTE DEVICE CHECK
Battery Remaining Longevity: 23 mo
Battery Voltage: 2.94 V
Brady Statistic AP VP Percent: 76.37 %
Brady Statistic AP VS Percent: 0 %
Brady Statistic AS VP Percent: 23.61 %
Brady Statistic AS VS Percent: 0.02 %
Brady Statistic RA Percent Paced: 73.21 %
Brady Statistic RV Percent Paced: 99.52 %
Date Time Interrogation Session: 20210323101758
Implantable Lead Implant Date: 20050810
Implantable Lead Implant Date: 20050810
Implantable Lead Implant Date: 20071015
Implantable Lead Location: 753858
Implantable Lead Location: 753859
Implantable Lead Location: 753860
Implantable Lead Model: 4194
Implantable Lead Model: 5076
Implantable Lead Model: 6949
Implantable Pulse Generator Implant Date: 20170718
Lead Channel Impedance Value: 266 Ohm
Lead Channel Impedance Value: 437 Ohm
Lead Channel Impedance Value: 456 Ohm
Lead Channel Impedance Value: 494 Ohm
Lead Channel Impedance Value: 513 Ohm
Lead Channel Impedance Value: 513 Ohm
Lead Channel Impedance Value: 570 Ohm
Lead Channel Impedance Value: 608 Ohm
Lead Channel Impedance Value: 684 Ohm
Lead Channel Pacing Threshold Amplitude: 0.5 V
Lead Channel Pacing Threshold Amplitude: 1.5 V
Lead Channel Pacing Threshold Pulse Width: 0.4 ms
Lead Channel Pacing Threshold Pulse Width: 0.4 ms
Lead Channel Sensing Intrinsic Amplitude: 16.875 mV
Lead Channel Sensing Intrinsic Amplitude: 16.875 mV
Lead Channel Sensing Intrinsic Amplitude: 4.375 mV
Lead Channel Sensing Intrinsic Amplitude: 4.375 mV
Lead Channel Setting Pacing Amplitude: 1.5 V
Lead Channel Setting Pacing Amplitude: 3 V
Lead Channel Setting Pacing Amplitude: 3.75 V
Lead Channel Setting Pacing Pulse Width: 0.6 ms
Lead Channel Setting Pacing Pulse Width: 1 ms
Lead Channel Setting Sensing Sensitivity: 2.8 mV

## 2020-01-08 NOTE — Progress Notes (Signed)
PPM Remote  

## 2020-01-13 DIAGNOSIS — J449 Chronic obstructive pulmonary disease, unspecified: Secondary | ICD-10-CM | POA: Diagnosis not present

## 2020-01-20 ENCOUNTER — Telehealth (HOSPITAL_COMMUNITY): Payer: Self-pay | Admitting: Licensed Clinical Social Worker

## 2020-01-20 NOTE — Telephone Encounter (Signed)
CSW received call back from pt wife stating that [facility] re-enrollment form was completed and emailed back to [place].  Form printed and completed by MD and faxed in for review- fax confirmation received.  [name], LCSW Clinical Social Worker [facility] Desk#: [phone] Cell#: [phone]

## 2020-01-20 NOTE — Telephone Encounter (Signed)
CSW received call from pt wife requesting we reorder pt Inspra.  CSW called [facility] but was informed pt enrollment expired in December and they would be unable to send any Inspra until he was re-enrolled.  CSW discussed with wife and emailed application form.  Wife reports he has enough Inspra at this time but we discussed possible need to fill at local pharmacy with GoodRx card if we can't get him re- enrolled before his medication runs out.  CSW will continue to follow and assist as needed  [name], [facility] Desk#: [phone] Cell#: [phone]

## 2020-01-23 ENCOUNTER — Telehealth (HOSPITAL_COMMUNITY): Payer: Self-pay | Admitting: Licensed Clinical Social Worker

## 2020-01-23 NOTE — Telephone Encounter (Signed)
Pt has been approved to get Inspra through [employer] patient assistance program until 10/16/20.  Shipment order was placed on 01/21/20 and should arrive to our office in 7-10 business days per rep Order Number: [id number]  CSW called pt wife and informed of approval and that shipment is on its way- wife expressed understanding  CSW will continue to follow and assist as needed  [name], [facility] Desk#: [phone] Cell#: [phone]

## 2020-02-13 DIAGNOSIS — J449 Chronic obstructive pulmonary disease, unspecified: Secondary | ICD-10-CM | POA: Diagnosis not present

## 2020-02-27 DIAGNOSIS — J209 Acute bronchitis, unspecified: Secondary | ICD-10-CM | POA: Diagnosis not present

## 2020-02-27 DIAGNOSIS — K219 Gastro-esophageal reflux disease without esophagitis: Secondary | ICD-10-CM | POA: Diagnosis not present

## 2020-02-27 DIAGNOSIS — J440 Chronic obstructive pulmonary disease with acute lower respiratory infection: Secondary | ICD-10-CM | POA: Diagnosis not present

## 2020-02-27 DIAGNOSIS — E039 Hypothyroidism, unspecified: Secondary | ICD-10-CM | POA: Diagnosis not present

## 2020-02-27 DIAGNOSIS — Z299 Encounter for prophylactic measures, unspecified: Secondary | ICD-10-CM | POA: Diagnosis not present

## 2020-02-27 DIAGNOSIS — J441 Chronic obstructive pulmonary disease with (acute) exacerbation: Secondary | ICD-10-CM | POA: Diagnosis not present

## 2020-02-27 DIAGNOSIS — I10 Essential (primary) hypertension: Secondary | ICD-10-CM | POA: Diagnosis not present

## 2020-03-14 DIAGNOSIS — J449 Chronic obstructive pulmonary disease, unspecified: Secondary | ICD-10-CM | POA: Diagnosis not present

## 2020-03-15 DIAGNOSIS — J449 Chronic obstructive pulmonary disease, unspecified: Secondary | ICD-10-CM | POA: Diagnosis not present

## 2020-03-15 DIAGNOSIS — I2510 Atherosclerotic heart disease of native coronary artery without angina pectoris: Secondary | ICD-10-CM | POA: Diagnosis not present

## 2020-03-15 DIAGNOSIS — M159 Polyosteoarthritis, unspecified: Secondary | ICD-10-CM | POA: Diagnosis not present

## 2020-03-15 DIAGNOSIS — E7800 Pure hypercholesterolemia, unspecified: Secondary | ICD-10-CM | POA: Diagnosis not present

## 2020-03-22 ENCOUNTER — Other Ambulatory Visit: Payer: Self-pay | Admitting: Neurology

## 2020-04-07 ENCOUNTER — Ambulatory Visit (INDEPENDENT_AMBULATORY_CARE_PROVIDER_SITE_OTHER): Payer: Medicare Other | Admitting: *Deleted

## 2020-04-07 DIAGNOSIS — I472 Ventricular tachycardia, unspecified: Secondary | ICD-10-CM

## 2020-04-07 LAB — CUP PACEART REMOTE DEVICE CHECK
Battery Remaining Longevity: 22 mo
Battery Voltage: 2.94 V
Brady Statistic AP VP Percent: 68.16 %
Brady Statistic AP VS Percent: 0 %
Brady Statistic AS VP Percent: 31.82 %
Brady Statistic AS VS Percent: 0.01 %
Brady Statistic RA Percent Paced: 68.02 %
Brady Statistic RV Percent Paced: 99.85 %
Date Time Interrogation Session: 20210622102542
Implantable Lead Implant Date: 20050810
Implantable Lead Implant Date: 20050810
Implantable Lead Implant Date: 20071015
Implantable Lead Location: 753858
Implantable Lead Location: 753859
Implantable Lead Location: 753860
Implantable Lead Model: 4194
Implantable Lead Model: 5076
Implantable Lead Model: 6949
Implantable Pulse Generator Implant Date: 20170718
Lead Channel Impedance Value: 266 Ohm
Lead Channel Impedance Value: 399 Ohm
Lead Channel Impedance Value: 456 Ohm
Lead Channel Impedance Value: 456 Ohm
Lead Channel Impedance Value: 475 Ohm
Lead Channel Impedance Value: 494 Ohm
Lead Channel Impedance Value: 551 Ohm
Lead Channel Impedance Value: 570 Ohm
Lead Channel Impedance Value: 608 Ohm
Lead Channel Pacing Threshold Amplitude: 0.5 V
Lead Channel Pacing Threshold Amplitude: 1.375 V
Lead Channel Pacing Threshold Pulse Width: 0.4 ms
Lead Channel Pacing Threshold Pulse Width: 0.4 ms
Lead Channel Sensing Intrinsic Amplitude: 16.875 mV
Lead Channel Sensing Intrinsic Amplitude: 16.875 mV
Lead Channel Sensing Intrinsic Amplitude: 3.5 mV
Lead Channel Sensing Intrinsic Amplitude: 3.5 mV
Lead Channel Setting Pacing Amplitude: 1.5 V
Lead Channel Setting Pacing Amplitude: 3 V
Lead Channel Setting Pacing Amplitude: 3.75 V
Lead Channel Setting Pacing Pulse Width: 0.6 ms
Lead Channel Setting Pacing Pulse Width: 1 ms
Lead Channel Setting Sensing Sensitivity: 2.8 mV

## 2020-04-08 NOTE — Progress Notes (Signed)
Remote pacemaker transmission.   

## 2020-04-14 DIAGNOSIS — J449 Chronic obstructive pulmonary disease, unspecified: Secondary | ICD-10-CM | POA: Diagnosis not present

## 2020-04-15 DIAGNOSIS — J449 Chronic obstructive pulmonary disease, unspecified: Secondary | ICD-10-CM | POA: Diagnosis not present

## 2020-04-15 DIAGNOSIS — I2510 Atherosclerotic heart disease of native coronary artery without angina pectoris: Secondary | ICD-10-CM | POA: Diagnosis not present

## 2020-04-15 DIAGNOSIS — M159 Polyosteoarthritis, unspecified: Secondary | ICD-10-CM | POA: Diagnosis not present

## 2020-04-15 DIAGNOSIS — E7800 Pure hypercholesterolemia, unspecified: Secondary | ICD-10-CM | POA: Diagnosis not present

## 2020-05-12 DIAGNOSIS — Z299 Encounter for prophylactic measures, unspecified: Secondary | ICD-10-CM | POA: Diagnosis not present

## 2020-05-12 DIAGNOSIS — J449 Chronic obstructive pulmonary disease, unspecified: Secondary | ICD-10-CM | POA: Diagnosis not present

## 2020-05-12 DIAGNOSIS — I5023 Acute on chronic systolic (congestive) heart failure: Secondary | ICD-10-CM | POA: Diagnosis not present

## 2020-05-12 DIAGNOSIS — J439 Emphysema, unspecified: Secondary | ICD-10-CM | POA: Diagnosis not present

## 2020-05-14 DIAGNOSIS — J449 Chronic obstructive pulmonary disease, unspecified: Secondary | ICD-10-CM | POA: Diagnosis not present

## 2020-05-15 DIAGNOSIS — I2510 Atherosclerotic heart disease of native coronary artery without angina pectoris: Secondary | ICD-10-CM | POA: Diagnosis not present

## 2020-05-15 DIAGNOSIS — J449 Chronic obstructive pulmonary disease, unspecified: Secondary | ICD-10-CM | POA: Diagnosis not present

## 2020-05-15 DIAGNOSIS — E7800 Pure hypercholesterolemia, unspecified: Secondary | ICD-10-CM | POA: Diagnosis not present

## 2020-05-15 DIAGNOSIS — M159 Polyosteoarthritis, unspecified: Secondary | ICD-10-CM | POA: Diagnosis not present

## 2020-05-29 ENCOUNTER — Telehealth (HOSPITAL_COMMUNITY): Payer: Self-pay | Admitting: Licensed Clinical Social Worker

## 2020-05-29 NOTE — Telephone Encounter (Signed)
CSW received call from pt wife requesting we reorder pt inspra  CSW called [facility] patient assistance and placed order  Order #: [id number]  Anticipate delivery to our office in 7-10 business days  [name], [place] Worker [facility] Desk#: [phone] Cell#: [phone]

## 2020-06-02 ENCOUNTER — Other Ambulatory Visit (HOSPITAL_COMMUNITY): Payer: Self-pay

## 2020-06-02 DIAGNOSIS — I2510 Atherosclerotic heart disease of native coronary artery without angina pectoris: Secondary | ICD-10-CM | POA: Diagnosis not present

## 2020-06-02 DIAGNOSIS — E7800 Pure hypercholesterolemia, unspecified: Secondary | ICD-10-CM | POA: Diagnosis not present

## 2020-06-02 DIAGNOSIS — M159 Polyosteoarthritis, unspecified: Secondary | ICD-10-CM | POA: Diagnosis not present

## 2020-06-02 DIAGNOSIS — J449 Chronic obstructive pulmonary disease, unspecified: Secondary | ICD-10-CM | POA: Diagnosis not present

## 2020-06-02 MED ORDER — LOSARTAN POTASSIUM 25 MG PO TABS: 12.5000 mg | ORAL_TABLET | Freq: Every day | ORAL | 3 refills | Status: DC

## 2020-06-10 ENCOUNTER — Telehealth (HOSPITAL_COMMUNITY): Payer: Self-pay | Admitting: *Deleted

## 2020-06-10 NOTE — Telephone Encounter (Signed)
Received pt's Inspra from [employer], med left at front desk for p/u, pt's wife is aware

## 2020-06-14 DIAGNOSIS — J449 Chronic obstructive pulmonary disease, unspecified: Secondary | ICD-10-CM | POA: Diagnosis not present

## 2020-06-16 DIAGNOSIS — G20 Parkinson's disease: Secondary | ICD-10-CM | POA: Diagnosis not present

## 2020-06-16 DIAGNOSIS — I10 Essential (primary) hypertension: Secondary | ICD-10-CM | POA: Diagnosis not present

## 2020-06-16 DIAGNOSIS — Z299 Encounter for prophylactic measures, unspecified: Secondary | ICD-10-CM | POA: Diagnosis not present

## 2020-06-16 DIAGNOSIS — I5023 Acute on chronic systolic (congestive) heart failure: Secondary | ICD-10-CM | POA: Diagnosis not present

## 2020-06-16 DIAGNOSIS — J449 Chronic obstructive pulmonary disease, unspecified: Secondary | ICD-10-CM | POA: Diagnosis not present

## 2020-06-18 ENCOUNTER — Other Ambulatory Visit: Payer: Self-pay

## 2020-06-18 ENCOUNTER — Ambulatory Visit (HOSPITAL_COMMUNITY)
Admission: RE | Admit: 2020-06-18 | Discharge: 2020-06-18 | Disposition: A | Discharge status: Home / Self Care | Payer: Medicare Other | Source: Ambulatory Visit | Attending: Cardiology | Admitting: Cardiology

## 2020-06-18 ENCOUNTER — Encounter (HOSPITAL_COMMUNITY): Payer: Medicare Other | Admitting: Cardiology

## 2020-06-18 VITALS — BP 128/70 | HR 65 | Wt 138.2 lb

## 2020-06-18 DIAGNOSIS — I5022 Chronic systolic (congestive) heart failure: Secondary | ICD-10-CM | POA: Insufficient documentation

## 2020-06-18 DIAGNOSIS — E039 Hypothyroidism, unspecified: Secondary | ICD-10-CM | POA: Insufficient documentation

## 2020-06-18 DIAGNOSIS — Z7901 Long term (current) use of anticoagulants: Secondary | ICD-10-CM | POA: Diagnosis not present

## 2020-06-18 DIAGNOSIS — Z95810 Presence of automatic (implantable) cardiac defibrillator: Secondary | ICD-10-CM | POA: Diagnosis not present

## 2020-06-18 DIAGNOSIS — I110 Hypertensive heart disease with heart failure: Secondary | ICD-10-CM | POA: Insufficient documentation

## 2020-06-18 DIAGNOSIS — Z79899 Other long term (current) drug therapy: Secondary | ICD-10-CM | POA: Diagnosis not present

## 2020-06-18 DIAGNOSIS — I272 Pulmonary hypertension, unspecified: Secondary | ICD-10-CM | POA: Insufficient documentation

## 2020-06-18 DIAGNOSIS — Z7982 Long term (current) use of aspirin: Secondary | ICD-10-CM | POA: Diagnosis not present

## 2020-06-18 DIAGNOSIS — Z955 Presence of coronary angioplasty implant and graft: Secondary | ICD-10-CM | POA: Diagnosis not present

## 2020-06-18 DIAGNOSIS — E785 Hyperlipidemia, unspecified: Secondary | ICD-10-CM | POA: Insufficient documentation

## 2020-06-18 DIAGNOSIS — I2510 Atherosclerotic heart disease of native coronary artery without angina pectoris: Secondary | ICD-10-CM | POA: Insufficient documentation

## 2020-06-18 DIAGNOSIS — Z86711 Personal history of pulmonary embolism: Secondary | ICD-10-CM | POA: Insufficient documentation

## 2020-06-18 DIAGNOSIS — Z87891 Personal history of nicotine dependence: Secondary | ICD-10-CM | POA: Insufficient documentation

## 2020-06-18 DIAGNOSIS — E538 Deficiency of other specified B group vitamins: Secondary | ICD-10-CM | POA: Insufficient documentation

## 2020-06-18 DIAGNOSIS — I442 Atrioventricular block, complete: Secondary | ICD-10-CM | POA: Diagnosis not present

## 2020-06-18 DIAGNOSIS — J449 Chronic obstructive pulmonary disease, unspecified: Secondary | ICD-10-CM | POA: Insufficient documentation

## 2020-06-18 DIAGNOSIS — G3183 Dementia with Lewy bodies: Secondary | ICD-10-CM | POA: Insufficient documentation

## 2020-06-18 LAB — CBC
HCT: 43 % (ref 39.0–52.0)
Hemoglobin: 13.8 g/dL (ref 13.0–17.0)
MCH: 29.3 pg (ref 26.0–34.0)
MCHC: 32.1 g/dL (ref 30.0–36.0)
MCV: 91.3 fL (ref 80.0–100.0)
Platelets: 192 10*3/uL (ref 150–400)
RBC: 4.71 MIL/uL (ref 4.22–5.81)
RDW: 13.2 % (ref 11.5–15.5)
WBC: 5.6 10*3/uL (ref 4.0–10.5)
nRBC: 0 % (ref 0.0–0.2)

## 2020-06-18 LAB — LIPID PANEL
Cholesterol: 135 mg/dL (ref 0–200)
HDL: 46 mg/dL (ref >40)
LDL Cholesterol: 71 mg/dL (ref 0–99)
Total CHOL/HDL Ratio: 2.9 RATIO
Triglycerides: 89 mg/dL (ref <150)
VLDL: 18 mg/dL (ref 0–40)

## 2020-06-18 LAB — BASIC METABOLIC PANEL
Anion gap: 7 (ref 5–15)
BUN: 16 mg/dL (ref 8–23)
CO2: 32 mmol/L (ref 22–32)
Calcium: 9.3 mg/dL (ref 8.9–10.3)
Chloride: 105 mmol/L (ref 98–111)
Creatinine, Ser: 1.09 mg/dL (ref 0.61–1.24)
GFR calc Af Amer: >60 mL/min (ref >60)
GFR calc non Af Amer: >60 mL/min (ref >60)
Glucose, Bld: 87 mg/dL (ref 70–99)
Potassium: 4.5 mmol/L (ref 3.5–5.1)
Sodium: 144 mmol/L (ref 135–145)

## 2020-06-18 LAB — VITAMIN B12: Vitamin B-12: 280 pg/mL (ref 180–914)

## 2020-06-18 LAB — TSH: TSH: 5.177 u[IU]/mL — ABNORMAL HIGH (ref 0.350–4.500)

## 2020-06-18 MED ORDER — EPLERENONE 25 MG PO TABS: 25.0000 mg | ORAL_TABLET | Freq: Every day | ORAL | 11 refills | Status: AC

## 2020-06-18 NOTE — Patient Instructions (Signed)
Increase Eplerenone 25 mg Daily  Stop Potassium  Labs done today, your results will be available in MyChart, we will contact you for abnormal readings.  Your physician recommends that you schedule a follow-up appointment in: 10 days, we have provided you a prescription to have this done locally  Please call our office in January to schedule your echocardiogram and follow up appointment  If you have any questions or concerns before your next appointment please send [place] a message through [place] or call our office at [phone].    TO LEAVE A MESSAGE FOR THE NURSE SELECT OPTION 2, PLEASE LEAVE A MESSAGE INCLUDING: . YOUR NAME . DATE OF BIRTH . CALL BACK NUMBER . REASON FOR CALL**this is important as we prioritize the call backs  [place] AS LONG AS YOU CALL BEFORE 4:00 PM  At the [facility], you and your health needs are our priority. As part of our continuing mission to provide you with exceptional heart care, we have created designated Provider Care Teams. These Care Teams include your primary Cardiologist (physician) and Advanced Practice Providers (APPs- Physician Assistants and Nurse Practitioners) who all work together to provide you with the care you need, when you need it.   You may see any of the following providers on your designated Care Team at your next follow up: Marland Kitchen Dr Glori Bickers . Dr Loralie Champagne . Darrick Grinder, NP . Lyda Jester, PA . Audry Riles, PharmD   Please be sure to bring in all your medications bottles to every appointment.

## 2020-06-18 NOTE — Progress Notes (Signed)
Date:  06/18/2020   ID:  [name], DOB 01/19/1941, MRN [id number]   Provider location: [facility] Type of Visit: Established patient   PCP:  Glenda Chroman, MD  Cardiologist: Dr. Aundra Dubin   History of Present Illness: [name] is a 79 y.o. male who was referred by Dr. Lovena Le for evaluation of CHF. Patient has a long cardiac history.  He had a prior PCI to the RCA with BMS.  Most recent cath in 2/17 showed nonobstructive CAD, patent RCA stent.  In 2009, echo showed EF 20-25%.  He went on to have ICD placed and later, CRT-P when he developed complete heart block requiring CRT upgrade (no longer has defibrillator). Echo in 1/17 showed EF improved to 55-60%. Echo in 5/19 showed EF 40-45%, moderate LVH, mild-moderate AI, normal RV.    Patient has been seen by pulmonary but not recently.  He does not appear to have been very compliant with inhalers.  He was started on Duonebs by Dr. Lenna Gilford but stopped it because it was "messing up my eyes."  He is currently on no regular COPD meds.    Echo in 10/19 showed EF up to 45-50% with mild AS.   He was diagnosed by neurology with Lewy body dementia.  He is now on Sinemet, Namenda, and Aricept.   Patient returns for followup of CHF and COPD. He uses home oxygen but not at all times. Breathing has gradually worsened.  Short of breath after walking about 50 feet.  No orthopnea/PND.  No chest pain.  Still with memory difficulty.  Generalized fatigue.  Weight stable.   ECG (personally reviewed): NSR, BiV paced  Medtronic device interrogation: >99% BiV paced, stable thoracic impedence.    Labs (1/19): LDL 80, HDL 42 Labs (4/19): K 3.7, creatinine 1.21, TSH normal Labs (5/19): K 4.3, creatinine 0.97 Labs (9/19): K 3.7, creatinine 1.26 Labs (10/19): K 4.1, creatinine 1.03, hgb 14.7, TSH 7.4, free T4 normal.  Labs (11/19): K 4, creatinine 1.06 Labs (4/20): K 4.3, creatinine 0.93 Labs (5/20): K 4, creatinine 1.03, hgb  14.7 Labs (1/21): K 4.2, creatinine 0.92  PMH: 1. Aortic insufficiency: Mild to moderate on most recent echo in 1/17.  2. HTN 3. B12 deficiency 4. Atrial tachycardia: S/p ablation in 1/19.  5. Hypothyroidism 6. Hyperlipidemia 7. Prior PE: Not currently on anticoagulation.  8. H/o VT: had CRT-D device, CRT-P since most recent generator change. .  9. CAD: h/o BMS to RCA.  - LHC (2/17) with mild nonobstructive CAD, patent RCA stent.  10. Complete heart block: Has Medtronic CRT-P device.   11. Chronic systolic CHF: Probably primarily nonischemic CMP as he does not have extensive enough CAD to explain CMP.  - Echo (2009): EF 20-25%.  - Echo (1/17): EF 55-60%, mild LV dilation, mild-moderate AI, normal RV size and systolic function.  - RHC (2/17): mean RA 10, PA 44/10, mean PCWP 12, CI 2.18.  - Echo (5/19): EF 40-45%, moderate LVH, mild-moderate AI, normal RV size and systolic function.  - Gynecomastia with spironolactone.  - Echo (10/19): EF 45-50%, mild AS.  12. COPD: Severe emphysema on prior chest CT.  Smoked in past.  - PFTs (12/17) with severe obstruction.  - He is on home oxygen.  13. Esophageal stricture s/p dilation in 3/20.  14. Lewy body dementia 15. Suspect recurrent aspiration PNA.   Current Outpatient Medications  Medication Sig Dispense Refill  . albuterol (PROVENTIL) (2.5 MG/3ML) 0.083%  nebulizer solution Take 2.5 mg by nebulization daily.    Marland Kitchen aspirin EC 81 MG tablet Take 1 tablet (81 mg total) by mouth at bedtime.    . bisoprolol (ZEBETA) 5 MG tablet Take 0.5 tablets (2.5 mg total) by mouth daily. 30 tablet 0  . Calcium Carb-Cholecalciferol (CALCIUM 500 + D3 PO) Take 1 tablet by mouth at bedtime.     . carbidopa-levodopa (SINEMET IR) 25-100 MG tablet Take 1 tablet by mouth 4 (four) times daily. 120 tablet 11  . donepezil (ARICEPT) 10 MG tablet Take 1 tablet (10 mg total) by mouth at bedtime. Please call [phone] to schedule follow up appt. 90 tablet 0  . eplerenone  (INSPRA) 25 MG tablet Take 1 tablet (25 mg total) by mouth at bedtime. 30 tablet 11  . FLUoxetine (PROZAC) 10 MG capsule Take 20 mg by mouth at bedtime.     . fluticasone (FLONASE) 50 MCG/ACT nasal spray Place 1 spray into both nostrils daily as needed (for sinus issues.).     Marland Kitchen furosemide (LASIX) 20 MG tablet Take 60 mg by mouth daily.    Marland Kitchen levothyroxine (SYNTHROID) 100 MCG tablet Take 100 mcg by mouth daily before breakfast.    . losartan (COZAAR) 25 MG tablet Take 0.5 tablets (12.5 mg total) by mouth daily. 15 tablet 3  . pantoprazole (PROTONIX) 40 MG tablet Take 40 mg by mouth daily as needed.    Vladimir Faster Glycol-Propyl Glycol (LUBRICANT EYE DROPS) 0.4-0.3 % SOLN Place 1-2 drops into both eyes 2 (two) times daily as needed (for dry/irritated eyes.).    Marland Kitchen simvastatin (ZOCOR) 20 MG tablet Take 20 mg by mouth daily.    . vitamin B-12 (CYANOCOBALAMIN) 500 MCG tablet Take 500 mcg by mouth daily.     No current facility-administered medications for this encounter.    Allergies:   Spironolactone   Social History:  The patient  reports that he quit smoking about 19 years ago. His smoking use included cigarettes. He started smoking about 62 years ago. He has a 43.00 pack-year smoking history. He has never used smokeless tobacco. He reports that he does not drink alcohol and does not use drugs.   Family History:  The patient's family history includes Diabetes type I in his sister; Lung cancer in his brother, brother, and brother.   ROS:  Please see the history of present illness.   All other systems are personally reviewed and negative.   Exam:   BP 128/70   Pulse 65   Wt 62.7 kg (138 lb 4 oz)   SpO2 92%   BMI 20.42 kg/m  General: NAD, thin.  Neck: No JVD, no thyromegaly or thyroid nodule.  Lungs: Distant breath sounds.  CV: Nondisplaced PMI.  Heart regular S1/S2, no S3/S4, no murmur.  No peripheral edema.  No carotid bruit.  Normal pedal pulses.  Abdomen: Soft, nontender, no  hepatosplenomegaly, no distention.  Skin: Intact without lesions or rashes.  Neurologic: Alert and oriented x 3.  Psych: Normal affect. Extremities: No clubbing or cyanosis.  HEENT: Normal.    Recent Labs: 11/03/2019: ALT 14 06/18/2020: BUN 16; Creatinine, Ser 1.09; Hemoglobin 13.8; Platelets 192; Potassium 4.5; Sodium 144; TSH 5.177  Personally reviewed   Wt Readings from Last 3 Encounters:  06/18/20 62.7 kg (138 lb 4 oz)  11/03/19 63.5 kg (140 lb)  08/21/19 63.1 kg (139 lb 3.2 oz)      ASSESSMENT AND PLAN:  1. Chronic systolic CHF: Initially, EF low in 20-25%  range in 2009.  However, over time, EF improved to 55-60% in 1/17.  Echo in 5/19 with EF 40-45%.  Echo in 10/19 with EF up to 45-50%.  Suspect primarily nonischemic cardiomyopathy as degree of coronary disease does not explain his initial low EF.  Patient has a Medtronic CRT-P device.  NYHA class III symptoms.  He is not volume overloaded on exam and last echo showed improved EF.  I still think that his symptoms are primarily due to severe COPD.   - Continue bisoprolol.  Stopping this did not help his dyspnea. - Continue losartan 12.5 mg daily.    - Continue Lasix 60 mg daily. BMET today.  - Increase eplerenone to 25 mg daily and stop KCl.  BMET today and in 10 days.  - I will repeat echo at followup in 6 months.    2. COPD: His COPD appears quite severe.  He has seen pulmonary and is on inhalers.  I think that COPD is the major cause of his current dyspnea and fatigue.  - Continue inhalers and home oxygen.  3. CAD: Cath in 2017 with nonobstructive disease, patent RCA stent. No chest pain.  - Continue statin, ASA 81. Check lipids today.  4. Aortic stenosis: Mild on last echo.  5. H/o atrial tachycardia: S/p ablation by Dr. Lovena Le, no palpitations.  6. Complete heart block: He has MDT CRT-P device.  7. Pulmonary hypertension: Noted on 2017 RHC, mild and likely group 3 from COPD. No change.  8. VT: Continue bisoprolol.     Signed, Loralie Champagne, MD  06/18/2020  [facility] [address] [facility] [place] [address] [phone] (office) [phone] (fax)

## 2020-06-21 DIAGNOSIS — J449 Chronic obstructive pulmonary disease, unspecified: Secondary | ICD-10-CM | POA: Diagnosis not present

## 2020-06-24 ENCOUNTER — Telehealth (HOSPITAL_COMMUNITY): Payer: Self-pay | Admitting: Cardiology

## 2020-06-24 NOTE — Telephone Encounter (Signed)
Wife left message on triage line requesting lab results  Returned call to number provided [phone]. No answer.left detailed message-labs stable no changes

## 2020-06-24 NOTE — Telephone Encounter (Signed)
Pt wife aware of TSH level

## 2020-06-25 ENCOUNTER — Telehealth: Payer: Self-pay | Admitting: Neurology

## 2020-06-25 NOTE — Telephone Encounter (Signed)
Pt's wife called stating that they are wanting to switch providers to Dr. Leta Baptist. Please advise.

## 2020-06-25 NOTE — Telephone Encounter (Signed)
Ok to switch 

## 2020-06-26 DIAGNOSIS — J440 Chronic obstructive pulmonary disease with acute lower respiratory infection: Secondary | ICD-10-CM | POA: Diagnosis not present

## 2020-06-26 DIAGNOSIS — Z299 Encounter for prophylactic measures, unspecified: Secondary | ICD-10-CM | POA: Diagnosis not present

## 2020-06-26 DIAGNOSIS — E039 Hypothyroidism, unspecified: Secondary | ICD-10-CM | POA: Diagnosis not present

## 2020-06-26 DIAGNOSIS — J209 Acute bronchitis, unspecified: Secondary | ICD-10-CM | POA: Diagnosis not present

## 2020-06-26 DIAGNOSIS — I5023 Acute on chronic systolic (congestive) heart failure: Secondary | ICD-10-CM | POA: Diagnosis not present

## 2020-07-02 ENCOUNTER — Ambulatory Visit (HOSPITAL_COMMUNITY)
Admission: RE | Admit: 2020-07-02 | Discharge: 2020-07-02 | Disposition: A | Discharge status: Home / Self Care | Payer: Medicare Other | Source: Ambulatory Visit | Attending: Internal Medicine | Admitting: Internal Medicine

## 2020-07-02 ENCOUNTER — Other Ambulatory Visit: Payer: Self-pay

## 2020-07-02 DIAGNOSIS — I5022 Chronic systolic (congestive) heart failure: Secondary | ICD-10-CM | POA: Insufficient documentation

## 2020-07-02 LAB — BASIC METABOLIC PANEL
Anion gap: 10 (ref 5–15)
BUN: 19 mg/dL (ref 8–23)
CO2: 29 mmol/L (ref 22–32)
Calcium: 9.4 mg/dL (ref 8.9–10.3)
Chloride: 102 mmol/L (ref 98–111)
Creatinine, Ser: 1.14 mg/dL (ref 0.61–1.24)
GFR calc Af Amer: >60 mL/min (ref >60)
GFR calc non Af Amer: >60 mL/min (ref >60)
Glucose, Bld: 81 mg/dL (ref 70–99)
Potassium: 3.7 mmol/L (ref 3.5–5.1)
Sodium: 141 mmol/L (ref 135–145)

## 2020-07-06 ENCOUNTER — Telehealth: Payer: Self-pay | Admitting: Family Medicine

## 2020-07-06 NOTE — Telephone Encounter (Signed)
The patient has seen Amy in the past.  Would he be amenable to continuing seeing her at his visits.  She can certainly consult with Dr. Krista Blue if needed.

## 2020-07-06 NOTE — Telephone Encounter (Signed)
It is Ok to switch provider

## 2020-07-06 NOTE — Telephone Encounter (Signed)
Spoke to daughter of pt.  Wife of pt would like to change providers (due to hard to understand).  Please advise.

## 2020-07-06 NOTE — Telephone Encounter (Signed)
Pt's daughter called stating that she in needing to speak to the RN about switching providers. She states pt does not want to see MD again. Please advise.

## 2020-07-08 NOTE — Telephone Encounter (Signed)
LMVM for pts daughter asking MM/NP question if ok to see AL/NP and consult if needed with Dr. Krista Blue.  Please call back when you can.

## 2020-07-09 ENCOUNTER — Encounter: Payer: Self-pay | Admitting: *Deleted

## 2020-07-09 NOTE — Telephone Encounter (Signed)
Sent my chart with Megan NP's question about seeing Amy NP.

## 2020-07-14 NOTE — Telephone Encounter (Signed)
Received call back from daughter, Vaughan Basta on [place] and advised her of NP's message. Vaughan Basta stated that patient and wife do not want to see NP. They want a different MD, and Vaughan Basta Is requesting a different MD due to her parents difficulty understanding Dr Krista Blue. Vaughan Basta is aware that Dr Krista Blue agreed to change of provider. I advised will send back to NP and let her know. She  verbalized understanding, appreciation. Marland Kitchen

## 2020-07-15 NOTE — Telephone Encounter (Signed)
Dr Jaynee Eagles would you be amendable taking over care for this patient?-- see notes below

## 2020-07-15 NOTE — Telephone Encounter (Signed)
Can he continue to see NP and NP can come to me if needed?

## 2020-07-15 NOTE — Telephone Encounter (Signed)
Is there certain provider that they prefer to see

## 2020-07-16 DIAGNOSIS — M159 Polyosteoarthritis, unspecified: Secondary | ICD-10-CM | POA: Diagnosis not present

## 2020-07-16 DIAGNOSIS — I2510 Atherosclerotic heart disease of native coronary artery without angina pectoris: Secondary | ICD-10-CM | POA: Diagnosis not present

## 2020-07-16 DIAGNOSIS — E7800 Pure hypercholesterolemia, unspecified: Secondary | ICD-10-CM | POA: Diagnosis not present

## 2020-07-16 DIAGNOSIS — J449 Chronic obstructive pulmonary disease, unspecified: Secondary | ICD-10-CM | POA: Diagnosis not present

## 2020-07-16 NOTE — Telephone Encounter (Signed)
[name]--lets dicuss in person

## 2020-07-16 NOTE — Telephone Encounter (Signed)
Called daughter and left detailed VM. I advised Dr Jaynee Eagles would be glad to assume patient's care, and Amy NP is her NP. The patient would see Amy as she partners with Dr Jaynee Eagles to see patient. Amy can consult with MD as needed. I advised office is now closed but she may reach out via my chart or phone next week. Left #.

## 2020-07-21 DIAGNOSIS — J449 Chronic obstructive pulmonary disease, unspecified: Secondary | ICD-10-CM | POA: Diagnosis not present

## 2020-07-24 DIAGNOSIS — J209 Acute bronchitis, unspecified: Secondary | ICD-10-CM | POA: Diagnosis not present

## 2020-07-24 DIAGNOSIS — J440 Chronic obstructive pulmonary disease with acute lower respiratory infection: Secondary | ICD-10-CM | POA: Diagnosis not present

## 2020-07-24 DIAGNOSIS — J9611 Chronic respiratory failure with hypoxia: Secondary | ICD-10-CM | POA: Diagnosis not present

## 2020-07-24 DIAGNOSIS — Z299 Encounter for prophylactic measures, unspecified: Secondary | ICD-10-CM | POA: Diagnosis not present

## 2020-08-04 ENCOUNTER — Ambulatory Visit (INDEPENDENT_AMBULATORY_CARE_PROVIDER_SITE_OTHER): Payer: Medicare Other

## 2020-08-04 ENCOUNTER — Telehealth: Payer: Self-pay | Admitting: Internal Medicine

## 2020-08-04 DIAGNOSIS — I472 Ventricular tachycardia, unspecified: Secondary | ICD-10-CM

## 2020-08-04 NOTE — Telephone Encounter (Signed)
I tried to help the pt but was unsuccessful. I conference the pt [employer] tech support for additional help.

## 2020-08-04 NOTE — Telephone Encounter (Signed)
Transmission received.

## 2020-08-04 NOTE — Telephone Encounter (Signed)
Pt wife called in and stated they are having trouble with their remote check.  They missed the check on 9/21 and they still have not be able to get it working.  They are not sure what to do about the checks ?     Best number [phone]

## 2020-08-05 LAB — CUP PACEART REMOTE DEVICE CHECK
Battery Remaining Longevity: 17 mo
Battery Voltage: 2.92 V
Brady Statistic AP VP Percent: 80.82 %
Brady Statistic AP VS Percent: 0.01 %
Brady Statistic AS VP Percent: 19.16 %
Brady Statistic AS VS Percent: 0.02 %
Brady Statistic RA Percent Paced: 80.48 %
Brady Statistic RV Percent Paced: 99.76 %
Date Time Interrogation Session: 20211019160208
Implantable Lead Implant Date: 20050810
Implantable Lead Implant Date: 20050810
Implantable Lead Implant Date: 20071015
Implantable Lead Location: 753858
Implantable Lead Location: 753859
Implantable Lead Location: 753860
Implantable Lead Model: 4194
Implantable Lead Model: 5076
Implantable Lead Model: 6949
Implantable Pulse Generator Implant Date: 20170718
Lead Channel Impedance Value: 266 Ohm
Lead Channel Impedance Value: 399 Ohm
Lead Channel Impedance Value: 418 Ohm
Lead Channel Impedance Value: 437 Ohm
Lead Channel Impedance Value: 456 Ohm
Lead Channel Impedance Value: 475 Ohm
Lead Channel Impedance Value: 551 Ohm
Lead Channel Impedance Value: 551 Ohm
Lead Channel Impedance Value: 589 Ohm
Lead Channel Pacing Threshold Amplitude: 0.625 V
Lead Channel Pacing Threshold Amplitude: 1.25 V
Lead Channel Pacing Threshold Pulse Width: 0.4 ms
Lead Channel Pacing Threshold Pulse Width: 0.4 ms
Lead Channel Sensing Intrinsic Amplitude: 27.5 mV
Lead Channel Sensing Intrinsic Amplitude: 27.5 mV
Lead Channel Sensing Intrinsic Amplitude: 4.25 mV
Lead Channel Sensing Intrinsic Amplitude: 4.25 mV
Lead Channel Setting Pacing Amplitude: 1.5 V
Lead Channel Setting Pacing Amplitude: 3 V
Lead Channel Setting Pacing Amplitude: 3.75 V
Lead Channel Setting Pacing Pulse Width: 0.6 ms
Lead Channel Setting Pacing Pulse Width: 1 ms
Lead Channel Setting Sensing Sensitivity: 2.8 mV

## 2020-08-06 ENCOUNTER — Encounter (HOSPITAL_COMMUNITY): Payer: Medicare Other | Admitting: Cardiology

## 2020-08-10 NOTE — Progress Notes (Signed)
Remote pacemaker transmission.   

## 2020-08-14 DIAGNOSIS — J449 Chronic obstructive pulmonary disease, unspecified: Secondary | ICD-10-CM | POA: Diagnosis not present

## 2020-08-14 DIAGNOSIS — M159 Polyosteoarthritis, unspecified: Secondary | ICD-10-CM | POA: Diagnosis not present

## 2020-08-14 DIAGNOSIS — E7800 Pure hypercholesterolemia, unspecified: Secondary | ICD-10-CM | POA: Diagnosis not present

## 2020-08-14 DIAGNOSIS — I2510 Atherosclerotic heart disease of native coronary artery without angina pectoris: Secondary | ICD-10-CM | POA: Diagnosis not present

## 2020-08-18 ENCOUNTER — Other Ambulatory Visit: Payer: Self-pay

## 2020-08-18 ENCOUNTER — Ambulatory Visit: Payer: Medicare Other | Admitting: Neurology

## 2020-08-18 ENCOUNTER — Encounter: Payer: Self-pay | Admitting: Neurology

## 2020-08-18 VITALS — BP 120/60 | HR 71 | Ht 68.0 in | Wt 147.0 lb

## 2020-08-18 DIAGNOSIS — F0281 Dementia in other diseases classified elsewhere with behavioral disturbance: Secondary | ICD-10-CM | POA: Diagnosis not present

## 2020-08-18 DIAGNOSIS — G3183 Dementia with Lewy bodies: Secondary | ICD-10-CM

## 2020-08-18 DIAGNOSIS — F02818 Dementia in other diseases classified elsewhere, unspecified severity, with other behavioral disturbance: Secondary | ICD-10-CM

## 2020-08-18 NOTE — Progress Notes (Signed)
[facility]    Provider:  Dr Jaynee Eagles Requesting Provider: Glenda Chroman, MD Primary Care Provider:  Glenda Chroman, MD  CC:  Lewy Body Dementia  HPI:  [name] is a 79 y.o. male here as requested by Glenda Chroman, MD for Lewy Body Dementia. They would like a second opinion. PMHx ventricular tachycardia, ischemic cardiomyopathy, ICD implantable cardiac defibrillator, hypertension, dyslipidemia, coronary artery disease, congestive heart failure, coronary artery disease, carotid artery disease, aortic insufficiency, gait abnormality, vitamin B12 deficiency, memory loss/dementia, COPD.  This is a patient who seen my colleague Dr. Marcial Pacas.  Initially seen July 02, 2018.  Since ablation surgery in January 2019 he was noted to having worsening confusion, feeling fatigued, not having energy, lost train of thought during middle of conversation, also much less active, moving slowly, mild worsening of gait abnormality, CT angiogram of the head in February 2019 showed atherosclerotic calcification, mild stenosis in the carotid siphon bilaterally and in the distal vertebral artery bilaterally, otherwise no significant intracranial stenosis, atrophy and chronic microvascular ischemic changes in the white matter.  Labs at that time in 2019 showed normal thyroid, B12 in February 2019 was mildly decreased at 218 and he was given B12 intramuscular.  No family history of dementia.  MMSE at the time was 19 out of 30.  Dr.Yan diagnosed degenerative central nervous system disorder with a vascular component, could not have an MRI due to pacemaker but angiogram of the head showed generalized atrophy worsening at the bilateral frontal region.  He was started on Namenda twice daily.  He was seen again in June 2020, he was sitting in a wheelchair, noted to have more confusion and gait abnormality, daughter reported REM sleep disorder, acting out of dreams, daytime sleeping most of the day and  nighttime with visual hallucinations.  On examination he had a mild bilateral upper extremity resting tremor, and nuchal rigidity. He was diagnosed with Lewy Body Dementia. Also on Sinemet.   He has dreams, he acts out his dreams, they are better now, he does not snore a lot, he does not stop breathing, sleeps some days, he gets up every day and does different things, he sees his grand kids, he checks to see if his grand kids are ok, back in January, February he thought they were at home and he was at church, having hallucinations. He sees things that are not there. Symptoms started in 2019, started worsening, but symptoms had been starting prior in 2017. Daughter and wife are here. Memory problems started first prior to the physical, sometimes he can have a tremor, the Sinemet helps a lot. Smell and appetite is ok, he used to eat more and not as well, no falls, getting slower with walking, he is very careful, less expressions on his facehe is not engaged anymore, some days better than others. He has some anxiety. He sits at home and wants to get better. He started prozac and no difference, REM sleep disprder better, no falls. Discussed HCPOA and POA.   There is a question of diagnosis. He is here with his daughter and    Reviewed notes, labs and imaging from outside physicians, which showed  CT head 11/03/2019:   FINDINGS: Brain: No intracranial hemorrhage, mass effect, or midline shift. Stable degree of atrophy from prior exam. No hydrocephalus. The basilar cisterns are patent. Mild chronic small vessel ischemia. No evidence of territorial infarct or acute ischemia. No extra-axial or intracranial fluid collection.  Vascular: Atherosclerosis  of skullbase vasculature without hyperdense vessel or abnormal calcification.  Skull: No fracture or focal lesion.  Sinuses/Orbits: Bilateral lens extraction. No acute findings.  Other: None.  IMPRESSION: 1. No acute intracranial abnormality. 2.  Stable atrophy and chronic small vessel ischemia.  Review of Systems: Patient complains of symptoms per HPI as well as the following symptoms: memory loss, confusion, numbness, difficulty swallowing, snoring, dizziness, tremor. Pertinent negatives and positives per HPI. All others negative.   Social History   Socioeconomic History  . Marital status: Married    Spouse name: Not on file  . Number of children: 2  . Years of education: 10  . Highest education level: Not on file  Occupational History  . Occupation: Development worker, community - self employed    Employer: RETIRED  Tobacco Use  . Smoking status: Former Smoker    Packs/day: 1.00    Years: 43.00    Pack years: 43.00    Types: Cigarettes    Start date: 10/17/1957    Quit date: 12/13/2000    Years since quitting: 19.6  . Smokeless tobacco: Never Used  . Tobacco comment: started at age 52  Vaping Use  . Vaping Use: Never used  Substance and Sexual Activity  . Alcohol use: No    Alcohol/week: 0.0 standard drinks  . Drug use: No  . Sexual activity: Not Currently  Other Topics Concern  . Not on file  Social History Narrative   Self employed Development worker, community; 2 daughters.       Lives at home with wife who is his primary caretaker   Right handed   Caffeine: 2 cups/day   Social Determinants of Health   Financial Resource Strain:   . Difficulty of Paying Living Expenses: Not on file  Food Insecurity:   . Worried About Charity fundraiser in the Last Year: Not on file  . Ran Out of Food in the Last Year: Not on file  Transportation Needs:   . Lack of Transportation (Medical): Not on file  . Lack of Transportation (Non-Medical): Not on file  Physical Activity:   . Days of Exercise per Week: Not on file  . Minutes of Exercise per Session: Not on file  Stress:   . Feeling of Stress : Not on file  Social Connections:   . Frequency of Communication with Friends and Family: Not on file  . Frequency of Social Gatherings with Friends and Family:  Not on file  . Attends Religious Services: Not on file  . Active Member of Clubs or Organizations: Not on file  . Attends Archivist Meetings: Not on file  . Marital Status: Not on file  Intimate Partner Violence:   . Fear of Current or Ex-Partner: Not on file  . Emotionally Abused: Not on file  . Physically Abused: Not on file  . Sexually Abused: Not on file    Family History  Problem Relation Age of Onset  . Diabetes type I Sister        daughter   . Lung cancer Brother        several types of cancer  . Lung cancer Brother        several types of cancer  . Lung cancer Brother   . Clotting disorder Neg Hx        also no repiratory disease   . Prostate cancer Neg Hx     Past Medical History:  Diagnosis Date  . AICD (automatic cardioverter/defibrillator) present   . Aortic  insufficiency     moderate aortic ins moderate/asymptomatic/normal LV cavity size.   . Carotid artery disease ([place])     less than 50% stenosis bilaterally  . Carotid artery disease ([place])   . Chronic systolic CHF (congestive heart failure) ([place])   . Coronary artery disease   . Drug-induced gynecomastia     secondary to spironolactone  . Dyslipidemia   . Hypertension   . ICD (implantable cardiac defibrillator), biventricular, in situ     Medtronic model  [id number]  . Ischemic cardiomyopathy     status post non-ST elevation microinfarction stent to the right coronary artery is a   . LV dysfunction     NYHA class I/prior ejection fraction 20-25% and an ejection fraction 7 2009 50-55%, ejection fraction 60% bedside echocardiogram July 2012   . PONV (postoperative nausea and vomiting)   . Pulmonary embolism (H[facility])    Prior history of pulmonary embolism off Coumadin.  . Ventricular tachycardia ([place])    Inducible ventricular polymorphic tachycardia status post ICD followed by upgrade with CRT-D 2007, battery change at 01/31/2011    Patient Active Problem List   Diagnosis Date Noted  . Dementia  ([place]) 03/20/2019  . Gait abnormality 03/20/2019  . Vitamin B12 deficiency 03/20/2019  . Dementia with Lewy bodies (CODE) ([place]) 03/20/2019  . Special screening for malignant neoplasms, colon 08/17/2018  . Memory loss 07/02/2018  . History of pneumonia 02/21/2018  . History of thyroid cancer 01/10/2018  . Postsurgical hypothyroidism 01/10/2018  . Pulmonary embolism ([place])   . PONV (postoperative nausea and vomiting)   . AICD (automatic cardioverter/defibrillator) present   . Hx pulmonary embolism 01/02/2017  . Status post biventricular pacemaker 01/02/2017  . Sepsis ([place]) 10/08/2016  . Pulmonary hypertension ([place]) 12/14/2015  . Coronary artery disease involving native coronary artery without angina pectoris 12/14/2015  . Laryngopharyngeal reflux (LPR) 12/14/2015  . Abnormal nuclear stress test 12/10/2015  . Dyspnea 12/10/2015  . Atrial tachycardia ([place]) 10/06/2015  . Postsurgical percutaneous transluminal coronary angioplasty (PTCA) status 09/14/2012  . Mild carotid artery disease ([place]) 09/14/2012  . Polymorphic ventricular tachycardia ([place]) 09/14/2012  . Chronic systolic heart failure ([place]) 05/22/2012  . Ischemic cardiomyopathy   . LV dysfunction   . Ventricular tachycardia ([place])   . Drug-induced gynecomastia   . Carotid artery disease ([place])   . Aortic insufficiency   . Dyslipidemia   . Biventricular implantable cardioverter-defibrillator in situ   . DYSLIPIDEMIA 08/23/2010  . CAROTID BRUIT 08/23/2010  . Centrilobular emphysema ([place]) 04/10/2009  . NEPHROLITHIASIS 04/10/2009  . RENAL CALCULUS, HX OF 04/10/2009    Past Surgical History:  Procedure Laterality Date  . ABLATION OF DYSRHYTHMIC FOCUS  10/20/2017   atrial tach  . ATRIAL TACH ABLATION N/A 10/20/2017   Procedure: ATRIAL TACH ABLATION;  Surgeon: Evans Lance, MD;  Location: [facility];  Service: Cardiovascular;  Laterality: N/A;  . BIOPSY  12/20/2018   Procedure: BIOPSY;  Surgeon: Rogene Houston, MD;   [facility];  Service: Endoscopy;;  colon  . CARDIAC CATHETERIZATION    . CARDIAC CATHETERIZATION N/A 12/10/2015   Procedure: Right/Left Heart Cath and Coronary Angiography;  Surgeon: Peter M Martinique, MD;  Location: [facility];  Service: Cardiovascular;  Laterality: N/A;  . CARDIAC DEFIBRILLATOR PLACEMENT     medtronic, remote - yes  . CATARACT EXTRACTION W/PHACO Left 08/25/2014   Procedure: CATARACT EXTRACTION PHACO AND INTRAOCULAR LENS PLACEMENT (IOC);  Surgeon: Tonny Branch, MD;  Location: [facility];  Service: Ophthalmology;  Laterality: Left;  CDE 5.79  . CATARACT EXTRACTION W/PHACO Right 09/18/2014   Procedure: CATARACT EXTRACTION PHACO AND INTRAOCULAR LENS PLACEMENT RIGHT EYE;  Surgeon: Tonny Branch, MD;  Location: [facility];  Service: Ophthalmology;  Laterality: Right;  CDE:3.35  . COLONOSCOPY N/A 12/20/2018   Procedure: COLONOSCOPY;  Surgeon: Rogene Houston, MD;  [facility];  Service: Endoscopy;  Laterality: N/A;  730  . CORONARY STENT PLACEMENT    . EP IMPLANTABLE DEVICE N/A 05/03/2016   Procedure: BIV PPM Generator Changeout;  Surgeon: Evans Lance, MD;  Location: [facility];  Service: Cardiovascular;  Laterality: N/A;  . ESOPHAGOSCOPY WITH DILITATION  12/20/2018   Procedure: ESOPHAGOSCOPY WITH DILITATION;  Surgeon: Rogene Houston, MD;  [facility];  Service: Endoscopy;;  . INGUINAL HERNIA REPAIR    . left breast biopsy    . POLYPECTOMY  12/20/2018   Procedure: POLYPECTOMY;  Surgeon: Rogene Houston, MD;  [facility];  Service: Endoscopy;;  colon  . RETROPUBIC PROSTATECTOMY    . THORACOTOMY     left  . THYROID SURGERY    . THYROIDECTOMY      Current Outpatient Medications  Medication Sig Dispense Refill  . albuterol (PROVENTIL) (2.5 MG/3ML) 0.083% nebulizer solution Take 2.5 mg by nebulization daily.    Marland Kitchen aspirin EC 81 MG tablet Take 1 tablet (81 mg total) by mouth at bedtime.    . bisoprolol (ZEBETA) 5 MG tablet Take 0.5  tablets (2.5 mg total) by mouth daily. 30 tablet 0  . Calcium Carb-Cholecalciferol (CALCIUM 500 + D3 PO) Take 1 tablet by mouth at bedtime.     . carbidopa-levodopa (SINEMET IR) 25-100 MG tablet Take 1 tablet by mouth 4 (four) times daily. 120 tablet 11  . donepezil (ARICEPT) 10 MG tablet Take 1 tablet (10 mg total) by mouth at bedtime. Please call [phone] to schedule follow up appt. (Patient taking differently: Take 5 mg by mouth at bedtime. Please call [phone] to schedule follow up appt.) 90 tablet 0  . eplerenone (INSPRA) 25 MG tablet Take 1 tablet (25 mg total) by mouth at bedtime. 30 tablet 11  . FLUoxetine (PROZAC) 10 MG capsule Take 20 mg by mouth at bedtime.     . fluticasone (FLONASE) 50 MCG/ACT nasal spray Place 1 spray into both nostrils daily as needed (for sinus issues.).     Marland Kitchen furosemide (LASIX) 20 MG tablet Take 60 mg by mouth daily.    Marland Kitchen levothyroxine (SYNTHROID) 112 MCG tablet Take 112 mcg by mouth daily before breakfast.    . losartan (COZAAR) 25 MG tablet Take 12.5 mg by mouth daily.    . pantoprazole (PROTONIX) 40 MG tablet Take 40 mg by mouth daily as needed.    Vladimir Faster Glycol-Propyl Glycol (LUBRICANT EYE DROPS) 0.4-0.3 % SOLN Place 1-2 drops into both eyes 2 (two) times daily as needed (for dry/irritated eyes.).    Marland Kitchen simvastatin (ZOCOR) 20 MG tablet Take 20 mg by mouth daily.    . vitamin B-12 (CYANOCOBALAMIN) 500 MCG tablet Take 500 mcg by mouth daily.     No current facility-administered medications for this visit.    Allergies as of 08/18/2020 - Review Complete 08/18/2020  Allergen Reaction Noted  . Spironolactone Other (See Comments) 09/14/2012    Vitals: BP 120/60 (BP Location: Right Arm, Patient Position: Sitting)   Pulse 71   Ht 5\' 8"  (1.727 m)   Wt 147 lb (66.7 kg)  BMI 22.35 kg/m  Last Weight:  Wt Readings from Last 1 Encounters:  08/18/20 147 lb (66.7 kg)   Last Height:   Ht Readings from Last 1 Encounters:  08/18/20 5\' 8"  (1.727 m)      Physical exam: Exam: Gen: NAD, conversant, well nourised, , well groomed                     CV: RRR, no MRG. No Carotid Bruits. No peripheral edema, warm, nontender Eyes: Conjunctivae clear without exudates or hemorrhage  Neuro: Detailed Neurologic Exam  Speech:    Speech is normal;  Cognition:  MMSE - Mini Mental State Exam 08/18/2020 03/20/2019 07/02/2018  Not completed: (No Data) - -  Orientation to time 3 1 3   Orientation to Place 2 5 2   Registration 3 3 3   Attention/ Calculation 0 0 0  Attention/Calculation-comments - - Refused to spell world backwards  Recall 1 1 3   Language- name 2 objects 2 2 2   Language- repeat 0 1 1  Language- follow 3 step command 3 3 2   Language- read & follow direction 0 1 1  Write a sentence 0 0 1  Copy design 0 0 1  Total score 14 17 19     Cranial Nerves: Masked facies    The pupils are equal, round, and reactive to light. [place] fields are full to threat. Extraocular movements are intact. Trigeminal sensation is intact and the muscles of mastication are normal. The face is symmetric. The palate elevates in the midline. Hearing intact to voice. Voice is normal. Shoulder shrug is normal. The tongue has normal motion without fasciculations.   Coordination:    bradykinesia  Gait:    Bradykinetic but not classically shuffling, decreased arm swing  Motor Observation:  postural and kinetic tremor Tone:   cogwheeling  Posture:    Posture is normal. normal erect    Strength:    Strength is V/V in the upper and lower limbs.      Sensation: intact to LT     Reflex Exam:  DTR's:    Deep tendon reflexes in the upper and lower extremities are brisk bilaterally.     Assessment/Plan:  Patient with likely Lewy Body Dementia. Family here for another opinion. He has white matter changes in the brain however cannot have an MRI for further evaluation. Vould also be vascular dementia or mixed dementia. They would like to be seen at a  specialty clinic will send to [facility].   Discussed Lewy Body Dementia vs Vascular dementia vs a mixed dementia in detail. Answered all questions Discussed POA, HCPOA and the need for future planning Could not tolerate Namenda or higher doses of Aricept.  We discussed prozac, may discuss with [facility] changing this We discussed Aricept and changing to Rivastigmine, discuss with [facility] Referral [facility] Discussed Fall risks, safety Variations in alertness very comon in LBD but can discuss a sleep evaluation with primary care If he has dysphagia or choking when eating or drinking, will need a swallow study monitor Stay hydrated, discussed UTIs   Orders Placed This Encounter  Procedures  . Ambulatory referral to Neurology     Cc: Glenda Chroman, MD,    Sarina Ill, MD  [facility] [address] [place], [place] [address]  Phone [phone] Fax [phone]   I spent 70 minutes of face-to-face and non-face-to-face time with patient on the  1. Lewy body  dementia with behavioral disturbance ([place])    diagnosis.  This included previsit chart review, lab review, study review, order entry, electronic health record documentation, patient education on the different diagnostic and therapeutic options, counseling and coordination of care, risks and benefits of management, compliance, or risk factor reduction

## 2020-08-18 NOTE — Patient Instructions (Signed)
DAT Scan   Lewy Body Dementia Lewy body dementia, also called dementia with Lewy bodies, is a condition that affects the way the brain functions. It is one form of dementia. In this condition, proteins called Lewy bodies build up in certain areas of the brain. This causes problems with:  Memory.  Decision making.  Behavior.  Speaking.  Thinking.  Movements and balance.  Problem solving. This condition is progressive, which means that it gets worse with time (is degenerative) and cannot be reversed. What are the causes? This condition is caused by the buildup of Lewy bodies in brain cells in areas of the brain that control memory, thinking, and movement. It is not known what causes the Lewy bodies to build up. What increases the risk? You are more likely to develop this condition if you:  Have a family history of Lewy body dementia or Parkinson's disease.  Are 60 years old or older.  Are male. What are the signs or symptoms? Symptoms of this condition may include:  Symptoms of dementia, such as: ? Trouble with memory. ? Trouble paying attention. ? Problems with planning and organizing. ? Problems with judgment. ? Behavioral problems.  Symptoms of Parkinson's disease, such as: ? Shaking movements that you cannot control (tremor). Tremors usually start in a hand or foot when you are resting (resting tremor). ? Stooped posture. ? Slowing of movement. ? Stiff muscles (rigidity). ? Loss of balance and stability when standing.  Seeing things that are not there (hallucinating).  Changes in memory, attention, and concentration that come and go (fluctuation).  Sleep problems, such as acting out dreams while you are asleep. How is this diagnosed? This condition is diagnosed by a specialist who diagnoses and treats this condition (neurologist). Your health care provider will talk with you and your family, friends, or caregivers about your history and symptoms. A thorough  medical history will be taken, and you will have a physical exam and tests. Tests may include:  Lab tests, such as blood or urine tests.  Imaging tests, such as a CT scan, a PET scan, or an MRI.  A test that involves removing and testing a small amount of the fluid that surrounds the brain and spinal cord (lumbar puncture).  A test where small metal discs are used to measure electrical activity in the brain (electroencephalogram or EEG).  Tests that evaluate brain function, such as memory tests, cognitive tests, and neuropsychological tests. How is this treated? There is no cure for this condition. Treatment focuses on managing your symptoms. Treatment may include:  Medicines. Everyone responds to medicines differently. Your response may change over time. Work with your health care provider to find the best medicines for you.  Speech, occupational, and physical therapy. Your health care provider can help direct you to support groups, organizations, and other health care providers who can help with decisions about your care. Follow these instructions at home: Medicine  Take over-the-counter and prescription medicines only as told by your health care provider.  To help you manage your medicines, use a pill organizer or pill reminder.  Avoid taking medicines that can affect thinking, such as pain medicines or sleeping medicines. Lifestyle  Make healthy lifestyle choices: ? Be physically active as told by your health care provider. ? Do not use any products that contain nicotine or tobacco, such as cigarettes, e-cigarettes, and chewing tobacco. If you need help quitting, ask your health care provider. ? Try to practice stress-management techniques when you experience stress,  such as mindfulness, yoga, or deep breathing. ? Stay socially connected. Talk regularly with other people, such as family, friends, and neighbors.  Make sure you sleep well. These tips can help you get a good  night's rest: ? Avoid napping during the day. ? Keep your sleeping area dark and cool. ? Avoid exercising a few hours before you go to bed. ? Avoid caffeine products in the evening. Eating and drinking  Do not drink alcohol.  Drink enough fluid to keep your urine pale yellow.  Eat a healthy diet. Safety      Work with your health care provider to determine what you need help with and what your safety needs are.  If you have trouble moving around, use a cane or walker as told by your health care provider.  Make sure your home environment is safe. To do this: ? Remove things that can be a tripping hazard, such as throw rugs or clutter. ? Install grab bars and railings in your home to prevent falls.  Talk with your health care provider about if it is safe for you to drive.  If you were given a bracelet that identifies you as a person with memory loss or tracks your location, make sure to wear it at all times. General instructions  Work with your family to make important decisions, such as advance directives, medical power of attorney, or a living will.  Keep all follow-up visits as told by your health care provider. This is important. Contact a health care provider if you have:  A fever.  Problems with choking or swallowing.  Any symptoms of a new or different illness.  New or worsening trouble with sleeping or increased daytime sleepiness.  New or worsening confusion. Get help right away if:  You feel depressed, sad, or feel that you want to harm yourself.  Your family members become concerned for your safety. If you ever feel like you may hurt yourself or others, or have thoughts about taking your own life, get help right away. You can go to your nearest emergency department or call:  Your local emergency services (911 in the [place]  A suicide crisis helpline, such as the [place] at 604 864 8119. This is open 24 hours a  day. Summary  Dementia is a condition that affects the way the brain functions. It often affects memory and thinking.  Lewy body dementia is a degenerative dementia.  This condition is caused by the buildup of proteins called Lewy bodies in brain cells. It is not known what causes the Lewy bodies to build up.  Work with your health care provider to determine what you need help with and what your safety needs are.  Your health care provider can help direct you to support groups, organizations, and other health care providers who can help with decisions about your care. This information is not intended to replace advice given to you by your health care provider. Make sure you discuss any questions you have with your health care provider. Document Revised: 11/29/2018 Document Reviewed: 11/29/2018 Elsevier Patient Education  [place].

## 2020-08-21 DIAGNOSIS — J449 Chronic obstructive pulmonary disease, unspecified: Secondary | ICD-10-CM | POA: Diagnosis not present

## 2020-08-26 ENCOUNTER — Other Ambulatory Visit (HOSPITAL_COMMUNITY): Payer: Self-pay | Admitting: *Deleted

## 2020-08-28 ENCOUNTER — Telehealth: Payer: Self-pay | Admitting: Pulmonary Disease

## 2020-08-28 NOTE — Telephone Encounter (Signed)
Switching due to location preference does not need MD approval   LMTCB for [facility]  Please schedule appt when she calls back, thanks!

## 2020-08-29 NOTE — Telephone Encounter (Signed)
Spoke to patient's spouse, Hassan Rowan.  Hassan Rowan requested to see Dr. Elsworth Soho at [place] office due to living closer to [facility]. appt scheduled for 10/08/2020 at 11:30. Nothing further needed.

## 2020-09-02 ENCOUNTER — Telehealth (HOSPITAL_COMMUNITY): Payer: Self-pay | Admitting: Licensed Clinical Social Worker

## 2020-09-02 NOTE — Telephone Encounter (Signed)
CSW received call from pt wife stating that pt needs another delivery of Inspra.  CSW called [place] and placed order- order # [id number]- anticipate delivery to our office within 7-10 business days.  Will continue to follow and assist as needed  [name], [facility] Desk#: [phone] Cell#: [phone]

## 2020-09-07 DIAGNOSIS — Z299 Encounter for prophylactic measures, unspecified: Secondary | ICD-10-CM | POA: Diagnosis not present

## 2020-09-07 DIAGNOSIS — J209 Acute bronchitis, unspecified: Secondary | ICD-10-CM | POA: Diagnosis not present

## 2020-09-07 DIAGNOSIS — J440 Chronic obstructive pulmonary disease with acute lower respiratory infection: Secondary | ICD-10-CM | POA: Diagnosis not present

## 2020-09-08 ENCOUNTER — Encounter: Payer: Self-pay | Admitting: Internal Medicine

## 2020-09-08 ENCOUNTER — Other Ambulatory Visit: Payer: Self-pay

## 2020-09-08 ENCOUNTER — Ambulatory Visit: Payer: Medicare Other | Admitting: Internal Medicine

## 2020-09-08 VITALS — BP 126/62 | HR 82 | Ht 68.0 in | Wt 140.2 lb

## 2020-09-08 DIAGNOSIS — I255 Ischemic cardiomyopathy: Secondary | ICD-10-CM | POA: Diagnosis not present

## 2020-09-08 DIAGNOSIS — I5022 Chronic systolic (congestive) heart failure: Secondary | ICD-10-CM | POA: Diagnosis not present

## 2020-09-08 DIAGNOSIS — Z950 Presence of cardiac pacemaker: Secondary | ICD-10-CM | POA: Diagnosis not present

## 2020-09-08 DIAGNOSIS — I442 Atrioventricular block, complete: Secondary | ICD-10-CM | POA: Diagnosis not present

## 2020-09-08 NOTE — Patient Instructions (Addendum)
Medication Instructions:  Your physician recommends that you continue on your current medications as directed. Please refer to the Current Medication list given to you today.  Labwork: None ordered.  Testing/Procedures: None ordered.  Follow-Up: Your physician wants you to follow-up in: 9 months with Dr. Lovena Le.   You will receive a reminder letter in the mail two months in advance. If you don't receive a letter, please call our office to schedule the follow-up appointment.  Remote monitoring is used to monitor your Pacemaker from home. This monitoring reduces the number of office visits required to check your device to one time per year. It allows [place] to keep an eye on the functioning of your device to ensure it is working properly. You are scheduled for a device check from home on 11/03/2020. You may send your transmission at any time that day. If you have a wireless device, the transmission will be sent automatically. After your physician reviews your transmission, you will receive a postcard with your next transmission date.  Any Other Special Instructions Will Be Listed Below (If Applicable).  If you need a refill on your cardiac medications before your next appointment, please call your pharmacy.

## 2020-09-08 NOTE — Progress Notes (Signed)
HPI [name] returns today for followup. He is a pleasant 79 yo man with CHB, s/p PPM with a biv system placed. He initially had severe LV dysfunction but this resolved after biv pacing over 15 years ago. His thresholds are increased, especially in the atrium. He has not had syncope. He has been diagnosed with Lewey body dementia. He denies peripheral edema. He has generalized fatigue. Allergies  Allergen Reactions  . Spironolactone Other (See Comments)    gynecomastia     Current Outpatient Medications  Medication Sig Dispense Refill  . albuterol (PROVENTIL) (2.5 MG/3ML) 0.083% nebulizer solution Take 2.5 mg by nebulization daily.    Marland Kitchen aspirin EC 81 MG tablet Take 1 tablet (81 mg total) by mouth at bedtime.    . bisoprolol (ZEBETA) 5 MG tablet Take 0.5 tablets (2.5 mg total) by mouth daily. 30 tablet 0  . Calcium Carb-Cholecalciferol (CALCIUM 500 + D3 PO) Take 1 tablet by mouth at bedtime.     . carbidopa-levodopa (SINEMET IR) 25-100 MG tablet Take 1 tablet by mouth 4 (four) times daily. 120 tablet 11  . donepezil (ARICEPT) 10 MG tablet Take 1 tablet (10 mg total) by mouth at bedtime. Please call [phone] to schedule follow up appt. (Patient taking differently: Take 5 mg by mouth at bedtime. Please call [phone] to schedule follow up appt.) 90 tablet 0  . eplerenone (INSPRA) 25 MG tablet Take 1 tablet (25 mg total) by mouth at bedtime. 30 tablet 11  . FLUoxetine (PROZAC) 10 MG capsule Take 20 mg by mouth at bedtime.     . fluticasone (FLONASE) 50 MCG/ACT nasal spray Place 1 spray into both nostrils daily as needed (for sinus issues.).     Marland Kitchen furosemide (LASIX) 20 MG tablet Take 60 mg by mouth daily.    Marland Kitchen levothyroxine (SYNTHROID) 112 MCG tablet Take 112 mcg by mouth daily before breakfast.    . losartan (COZAAR) 25 MG tablet Take 12.5 mg by mouth daily.    . pantoprazole (PROTONIX) 40 MG tablet Take 40 mg by mouth daily as needed.    Vladimir Faster Glycol-Propyl Glycol (LUBRICANT EYE  DROPS) 0.4-0.3 % SOLN Place 1-2 drops into both eyes 2 (two) times daily as needed (for dry/irritated eyes.).    Marland Kitchen simvastatin (ZOCOR) 20 MG tablet Take 20 mg by mouth daily.    . SYMBICORT 160-4.5 MCG/ACT inhaler Inhale 1 puff into the lungs as needed.    . vitamin B-12 (CYANOCOBALAMIN) 500 MCG tablet Take 500 mcg by mouth daily.     No current facility-administered medications for this visit.     Past Medical History:  Diagnosis Date  . AICD (automatic cardioverter/defibrillator) present   . Aortic insufficiency     moderate aortic ins moderate/asymptomatic/normal LV cavity size.   . Carotid artery disease ([place])     less than 50% stenosis bilaterally  . Carotid artery disease ([place])   . Chronic systolic CHF (congestive heart failure) ([place])   . Coronary artery disease   . Drug-induced gynecomastia     secondary to spironolactone  . Dyslipidemia   . Hypertension   . ICD (implantable cardiac defibrillator), biventricular, in situ     Medtronic model  [id number]  . Ischemic cardiomyopathy     status post non-ST elevation microinfarction stent to the right coronary artery is a   . LV dysfunction     NYHA class I/prior ejection fraction 20-25% and an ejection fraction 7 2009 50-55%, ejection fraction 60%  bedside echocardiogram July 2012   . PONV (postoperative nausea and vomiting)   . Pulmonary embolism (H[facility])    Prior history of pulmonary embolism off Coumadin.  . Ventricular tachycardia ([place])    Inducible ventricular polymorphic tachycardia status post ICD followed by upgrade with CRT-D 2007, battery change at 01/31/2011    ROS:   All systems reviewed and negative except as noted in the HPI.   Past Surgical History:  Procedure Laterality Date  . ABLATION OF DYSRHYTHMIC FOCUS  10/20/2017   atrial tach  . ATRIAL TACH ABLATION N/A 10/20/2017   Procedure: ATRIAL TACH ABLATION;  Surgeon: Evans Lance, MD;  Location: [place] [facility];  Service: Cardiovascular;  Laterality: N/A;    . BIOPSY  12/20/2018   Procedure: BIOPSY;  Surgeon: Rogene Houston, MD;  [facility];  Service: Endoscopy;;  colon  . CARDIAC CATHETERIZATION    . CARDIAC CATHETERIZATION N/A 12/10/2015   Procedure: Right/Left Heart Cath and Coronary Angiography;  Surgeon: Peter M Martinique, MD;  Location: [facility];  Service: Cardiovascular;  Laterality: N/A;  . CARDIAC DEFIBRILLATOR PLACEMENT     medtronic, remote - yes  . CATARACT EXTRACTION W/PHACO Left 08/25/2014   Procedure: CATARACT EXTRACTION PHACO AND INTRAOCULAR LENS PLACEMENT (IOC);  Surgeon: Tonny Branch, MD;  Location: [facility];  Service: Ophthalmology;  Laterality: Left;  CDE 5.79  . CATARACT EXTRACTION W/PHACO Right 09/18/2014   Procedure: CATARACT EXTRACTION PHACO AND INTRAOCULAR LENS PLACEMENT RIGHT EYE;  Surgeon: Tonny Branch, MD;  Location: [facility];  Service: Ophthalmology;  Laterality: Right;  CDE:3.35  . COLONOSCOPY N/A 12/20/2018   Procedure: COLONOSCOPY;  Surgeon: Rogene Houston, MD;  [facility];  Service: Endoscopy;  Laterality: N/A;  730  . CORONARY STENT PLACEMENT    . EP IMPLANTABLE DEVICE N/A 05/03/2016   Procedure: BIV PPM Generator Changeout;  Surgeon: Evans Lance, MD;  Location: [place] [facility];  Service: Cardiovascular;  Laterality: N/A;  . ESOPHAGOSCOPY WITH DILITATION  12/20/2018   Procedure: ESOPHAGOSCOPY WITH DILITATION;  Surgeon: Rogene Houston, MD;  [facility];  Service: Endoscopy;;  . INGUINAL HERNIA REPAIR    . left breast biopsy    . POLYPECTOMY  12/20/2018   Procedure: POLYPECTOMY;  Surgeon: Rogene Houston, MD;  [facility];  Service: Endoscopy;;  colon  . RETROPUBIC PROSTATECTOMY    . THORACOTOMY     left  . THYROID SURGERY    . THYROIDECTOMY       Family History  Problem Relation Age of Onset  . Diabetes type I Sister        daughter   . Lung cancer Brother        several types of cancer  . Lung cancer Brother        several types of cancer  . Lung  cancer Brother   . Clotting disorder Neg Hx        also no repiratory disease   . Prostate cancer Neg Hx      Social History   Socioeconomic History  . Marital status: Married    Spouse name: Not on file  . Number of children: 2  . Years of education: 10  . Highest education level: Not on file  Occupational History  . Occupation: Development worker, community - self employed    Employer: RETIRED  Tobacco Use  . Smoking status: Former Smoker    Packs/day: 1.00    Years: 43.00  Pack years: 43.00    Types: Cigarettes    Start date: 10/17/1957    Quit date: 12/13/2000    Years since quitting: 19.7  . Smokeless tobacco: Never Used  . Tobacco comment: started at age 34  Vaping Use  . Vaping Use: Never used  Substance and Sexual Activity  . Alcohol use: No    Alcohol/week: 0.0 standard drinks  . Drug use: No  . Sexual activity: Not Currently  Other Topics Concern  . Not on file  Social History Narrative   Self employed Development worker, community; 2 daughters.       Lives at home with wife who is his primary caretaker   Right handed   Caffeine: 2 cups/day   Social Determinants of Health   Financial Resource Strain:   . Difficulty of Paying Living Expenses: Not on file  Food Insecurity:   . Worried About Charity fundraiser in the Last Year: Not on file  . Ran Out of Food in the Last Year: Not on file  Transportation Needs:   . Lack of Transportation (Medical): Not on file  . Lack of Transportation (Non-Medical): Not on file  Physical Activity:   . Days of Exercise per Week: Not on file  . Minutes of Exercise per Session: Not on file  Stress:   . Feeling of Stress : Not on file  Social Connections:   . Frequency of Communication with Friends and Family: Not on file  . Frequency of Social Gatherings with Friends and Family: Not on file  . Attends Religious Services: Not on file  . Active Member of Clubs or Organizations: Not on file  . Attends Archivist Meetings: Not on file  . Marital  Status: Not on file  Intimate Partner Violence:   . Fear of Current or Ex-Partner: Not on file  . Emotionally Abused: Not on file  . Physically Abused: Not on file  . Sexually Abused: Not on file     BP 126/62   Pulse 82   Ht 5\' 8"  (1.727 m)   Wt 140 lb 3.2 oz (63.6 kg)   SpO2 (!) 80% Comment: without oxygen  BMI 21.32 kg/m   Physical Exam:  Well appearing 79 yo man, NAD HEENT: Unremarkable Neck:  No JVD, no thyromegally Lymphatics:  No adenopathy Back:  No CVA tenderness Lungs:  Clear with no wheezes HEART:  Regular rate rhythm, no murmurs, no rubs, no clicks Abd:  soft, positive bowel sounds, no organomegally, no rebound, no guarding Ext:  2 plus pulses, no edema, no cyanosis, no clubbing Skin:  No rashes no nodules Neuro:  CN II through XII intact, motor grossly intact  DEVICE  Normal device function.  See PaceArt for details.   Assess/Plan: 1. Chronic systolic heart failure - his symptoms are class 2 and his EF is almost normal.  2. CAD - he denies anginal symptoms. He will continue his current meds. 3. CHB - he is asymptomatic, s/p PPM insertion.  4. Dyslipidemia - he will continue his daily simvastatin therapy.  Carleene Overlie Alleya Demeter,MD

## 2020-09-14 ENCOUNTER — Telehealth (HOSPITAL_COMMUNITY): Payer: Self-pay | Admitting: *Deleted

## 2020-09-14 NOTE — Telephone Encounter (Signed)
Received pt's Inspra from [employer], pt aware medication at front desk for him to p/u

## 2020-09-15 DIAGNOSIS — E7800 Pure hypercholesterolemia, unspecified: Secondary | ICD-10-CM | POA: Diagnosis not present

## 2020-09-15 DIAGNOSIS — I2510 Atherosclerotic heart disease of native coronary artery without angina pectoris: Secondary | ICD-10-CM | POA: Diagnosis not present

## 2020-09-15 DIAGNOSIS — J449 Chronic obstructive pulmonary disease, unspecified: Secondary | ICD-10-CM | POA: Diagnosis not present

## 2020-09-15 DIAGNOSIS — M159 Polyosteoarthritis, unspecified: Secondary | ICD-10-CM | POA: Diagnosis not present

## 2020-09-20 DIAGNOSIS — J449 Chronic obstructive pulmonary disease, unspecified: Secondary | ICD-10-CM | POA: Diagnosis not present

## 2020-09-23 ENCOUNTER — Other Ambulatory Visit (HOSPITAL_COMMUNITY): Payer: Medicare Other

## 2020-09-24 LAB — CUP PACEART INCLINIC DEVICE CHECK
Brady Statistic AP VP Percent: 77.2 %
Brady Statistic AP VS Percent: 0.1 % — CL
Brady Statistic AS VP Percent: 22.8 %
Brady Statistic AS VS Percent: 0.1 % — CL
Date Time Interrogation Session: 20211123164750
Implantable Lead Implant Date: 20050810
Implantable Lead Implant Date: 20050810
Implantable Lead Implant Date: 20071015
Implantable Lead Location: 753858
Implantable Lead Location: 753859
Implantable Lead Location: 753860
Implantable Lead Model: 4194
Implantable Lead Model: 5076
Implantable Lead Model: 6949
Implantable Pulse Generator Implant Date: 20170718
Lead Channel Pacing Threshold Amplitude: 0.5 V
Lead Channel Pacing Threshold Amplitude: 1 V
Lead Channel Pacing Threshold Amplitude: 2.75 V
Lead Channel Pacing Threshold Pulse Width: 0.4 ms
Lead Channel Pacing Threshold Pulse Width: 0.6 ms
Lead Channel Pacing Threshold Pulse Width: 1 ms
Lead Channel Sensing Intrinsic Amplitude: 20 mV
Lead Channel Sensing Intrinsic Amplitude: 4.1 mV

## 2020-10-01 DIAGNOSIS — J441 Chronic obstructive pulmonary disease with (acute) exacerbation: Secondary | ICD-10-CM | POA: Diagnosis not present

## 2020-10-01 DIAGNOSIS — J209 Acute bronchitis, unspecified: Secondary | ICD-10-CM | POA: Diagnosis not present

## 2020-10-01 DIAGNOSIS — I10 Essential (primary) hypertension: Secondary | ICD-10-CM | POA: Diagnosis not present

## 2020-10-01 DIAGNOSIS — J440 Chronic obstructive pulmonary disease with acute lower respiratory infection: Secondary | ICD-10-CM | POA: Diagnosis not present

## 2020-10-01 DIAGNOSIS — Z299 Encounter for prophylactic measures, unspecified: Secondary | ICD-10-CM | POA: Diagnosis not present

## 2020-10-08 ENCOUNTER — Ambulatory Visit: Payer: Medicare Other | Admitting: Pulmonary Disease

## 2020-10-15 DIAGNOSIS — M159 Polyosteoarthritis, unspecified: Secondary | ICD-10-CM | POA: Diagnosis not present

## 2020-10-15 DIAGNOSIS — J449 Chronic obstructive pulmonary disease, unspecified: Secondary | ICD-10-CM | POA: Diagnosis not present

## 2020-10-15 DIAGNOSIS — E7800 Pure hypercholesterolemia, unspecified: Secondary | ICD-10-CM | POA: Diagnosis not present

## 2020-10-15 DIAGNOSIS — I2510 Atherosclerotic heart disease of native coronary artery without angina pectoris: Secondary | ICD-10-CM | POA: Diagnosis not present

## 2020-10-20 DIAGNOSIS — Z6821 Body mass index (BMI) 21.0-21.9, adult: Secondary | ICD-10-CM | POA: Diagnosis not present

## 2020-10-20 DIAGNOSIS — G20 Parkinson's disease: Secondary | ICD-10-CM | POA: Diagnosis not present

## 2020-10-20 DIAGNOSIS — Z87891 Personal history of nicotine dependence: Secondary | ICD-10-CM | POA: Diagnosis not present

## 2020-10-20 DIAGNOSIS — R410 Disorientation, unspecified: Secondary | ICD-10-CM | POA: Diagnosis not present

## 2020-10-20 DIAGNOSIS — R5383 Other fatigue: Secondary | ICD-10-CM | POA: Diagnosis not present

## 2020-10-20 DIAGNOSIS — I10 Essential (primary) hypertension: Secondary | ICD-10-CM | POA: Diagnosis not present

## 2020-10-20 DIAGNOSIS — Z299 Encounter for prophylactic measures, unspecified: Secondary | ICD-10-CM | POA: Diagnosis not present

## 2020-10-20 DIAGNOSIS — Z79899 Other long term (current) drug therapy: Secondary | ICD-10-CM | POA: Diagnosis not present

## 2020-10-21 ENCOUNTER — Other Ambulatory Visit: Payer: Self-pay | Admitting: Neurology

## 2020-10-21 ENCOUNTER — Other Ambulatory Visit (HOSPITAL_COMMUNITY): Payer: Self-pay | Admitting: Cardiology

## 2020-10-21 DIAGNOSIS — J449 Chronic obstructive pulmonary disease, unspecified: Secondary | ICD-10-CM | POA: Diagnosis not present

## 2020-10-23 ENCOUNTER — Telehealth: Payer: Self-pay | Admitting: Neurology

## 2020-10-23 ENCOUNTER — Other Ambulatory Visit: Payer: Self-pay | Admitting: Neurology

## 2020-10-23 MED ORDER — LORAZEPAM 0.5 MG PO TABS: 0.5000 mg | ORAL_TABLET | Freq: Four times a day (QID) | ORAL | 1 refills | Status: AC | PRN

## 2020-10-23 MED ORDER — RISPERIDONE 0.5 MG PO TABS: 0.5000 mg | ORAL_TABLET | Freq: Two times a day (BID) | ORAL | 6 refills | Status: AC

## 2020-10-23 NOTE — Telephone Encounter (Signed)
I spoke with Dr. Woody Seller, he will send in antibiotic and treat empirically. I increased risperdal and ativan for extreme agitation.

## 2020-10-23 NOTE — Telephone Encounter (Signed)
I spoke with daughter.  Patient has taken a turn over the last week, I explained to her that I found this very unusual that dementia would worsen so acutely in such a short period of time and this is more likely something like a urinary tract infection or other causing such a significant decline in such a short period of time.  Patient was started on Risperdal and I explained to daughter we could increase that and have him take it twice a day.  Sometimes we prefer to use Seroquel due to decreased risk of extraparametal side effects however he is already been started on Risperdal and we can continue with that.  He did go and have a urinalysis which she was told was negative but no culture was performed.  I discussed that dopamine blocking agents are not FDA approved in the elderly and carry a black box warning but we do use them in cases of agitation and dementia and the risks have to be weighed against the benefits.  Daughter is a Marine scientist and has a very good understanding.  I discussed we could also try a small amount of benzodiazepine as needed as needed for agitation however this also carries risk of side effects including sedation and falls.  But it sounds as though her mother is having an exceedingly difficult time with his behavior and aggression.  I called [facility] they cannot take a verbal for another UA and culture. Explained we could treat with antibiotics however we cannot get a culture after starting them, and since we don;t have a diagnosis we would be treating him to see if he gets better and antibiotics carry side effects as well. However may be better than taking him to an ED (waits are 10 hours or longer) or waiting until Monday.

## 2020-10-27 DIAGNOSIS — Z20822 Contact with and (suspected) exposure to covid-19: Secondary | ICD-10-CM | POA: Diagnosis not present

## 2020-10-27 DIAGNOSIS — R410 Disorientation, unspecified: Secondary | ICD-10-CM | POA: Diagnosis not present

## 2020-10-27 DIAGNOSIS — E890 Postprocedural hypothyroidism: Secondary | ICD-10-CM | POA: Diagnosis not present

## 2020-10-27 DIAGNOSIS — S79912A Unspecified injury of left hip, initial encounter: Secondary | ICD-10-CM | POA: Diagnosis not present

## 2020-10-27 DIAGNOSIS — I5022 Chronic systolic (congestive) heart failure: Secondary | ICD-10-CM | POA: Diagnosis not present

## 2020-10-27 DIAGNOSIS — N390 Urinary tract infection, site not specified: Secondary | ICD-10-CM | POA: Diagnosis not present

## 2020-10-27 DIAGNOSIS — R296 Repeated falls: Secondary | ICD-10-CM | POA: Diagnosis not present

## 2020-10-27 DIAGNOSIS — I255 Ischemic cardiomyopathy: Secondary | ICD-10-CM | POA: Diagnosis not present

## 2020-10-27 DIAGNOSIS — R0602 Shortness of breath: Secondary | ICD-10-CM | POA: Diagnosis not present

## 2020-10-27 DIAGNOSIS — S79911A Unspecified injury of right hip, initial encounter: Secondary | ICD-10-CM | POA: Diagnosis not present

## 2020-10-27 DIAGNOSIS — I517 Cardiomegaly: Secondary | ICD-10-CM | POA: Diagnosis not present

## 2020-10-27 DIAGNOSIS — J9621 Acute and chronic respiratory failure with hypoxia: Secondary | ICD-10-CM | POA: Diagnosis not present

## 2020-10-27 DIAGNOSIS — I2699 Other pulmonary embolism without acute cor pulmonale: Secondary | ICD-10-CM | POA: Diagnosis not present

## 2020-10-27 DIAGNOSIS — K5900 Constipation, unspecified: Secondary | ICD-10-CM | POA: Diagnosis not present

## 2020-10-27 DIAGNOSIS — J9611 Chronic respiratory failure with hypoxia: Secondary | ICD-10-CM | POA: Diagnosis not present

## 2020-10-27 DIAGNOSIS — Z95810 Presence of automatic (implantable) cardiac defibrillator: Secondary | ICD-10-CM | POA: Diagnosis not present

## 2020-10-27 DIAGNOSIS — J449 Chronic obstructive pulmonary disease, unspecified: Secondary | ICD-10-CM | POA: Diagnosis not present

## 2020-10-27 DIAGNOSIS — I472 Ventricular tachycardia: Secondary | ICD-10-CM | POA: Diagnosis not present

## 2020-10-27 DIAGNOSIS — I2510 Atherosclerotic heart disease of native coronary artery without angina pectoris: Secondary | ICD-10-CM | POA: Diagnosis not present

## 2020-10-27 DIAGNOSIS — J432 Centrilobular emphysema: Secondary | ICD-10-CM | POA: Diagnosis not present

## 2020-10-27 DIAGNOSIS — K649 Unspecified hemorrhoids: Secondary | ICD-10-CM | POA: Diagnosis not present

## 2020-10-27 DIAGNOSIS — R269 Unspecified abnormalities of gait and mobility: Secondary | ICD-10-CM | POA: Diagnosis not present

## 2020-10-27 DIAGNOSIS — Z515 Encounter for palliative care: Secondary | ICD-10-CM | POA: Diagnosis not present

## 2020-10-27 DIAGNOSIS — E785 Hyperlipidemia, unspecified: Secondary | ICD-10-CM | POA: Diagnosis not present

## 2020-10-27 DIAGNOSIS — I2693 Single subsegmental pulmonary embolism without acute cor pulmonale: Secondary | ICD-10-CM | POA: Diagnosis not present

## 2020-10-27 DIAGNOSIS — I442 Atrioventricular block, complete: Secondary | ICD-10-CM | POA: Diagnosis not present

## 2020-10-27 DIAGNOSIS — I350 Nonrheumatic aortic (valve) stenosis: Secondary | ICD-10-CM | POA: Diagnosis not present

## 2020-10-27 DIAGNOSIS — Z043 Encounter for examination and observation following other accident: Secondary | ICD-10-CM | POA: Diagnosis not present

## 2020-10-27 DIAGNOSIS — R918 Other nonspecific abnormal finding of lung field: Secondary | ICD-10-CM | POA: Diagnosis not present

## 2020-10-27 DIAGNOSIS — Z66 Do not resuscitate: Secondary | ICD-10-CM | POA: Diagnosis not present

## 2020-10-27 DIAGNOSIS — R1310 Dysphagia, unspecified: Secondary | ICD-10-CM | POA: Diagnosis not present

## 2020-10-28 DIAGNOSIS — K649 Unspecified hemorrhoids: Secondary | ICD-10-CM | POA: Diagnosis not present

## 2020-10-28 DIAGNOSIS — I517 Cardiomegaly: Secondary | ICD-10-CM | POA: Diagnosis not present

## 2020-10-28 DIAGNOSIS — Z515 Encounter for palliative care: Secondary | ICD-10-CM | POA: Diagnosis not present

## 2020-10-28 DIAGNOSIS — I351 Nonrheumatic aortic (valve) insufficiency: Secondary | ICD-10-CM | POA: Diagnosis not present

## 2020-10-28 DIAGNOSIS — I472 Ventricular tachycardia: Secondary | ICD-10-CM | POA: Diagnosis not present

## 2020-10-28 DIAGNOSIS — J432 Centrilobular emphysema: Secondary | ICD-10-CM | POA: Diagnosis not present

## 2020-10-28 DIAGNOSIS — I2510 Atherosclerotic heart disease of native coronary artery without angina pectoris: Secondary | ICD-10-CM | POA: Diagnosis not present

## 2020-10-28 DIAGNOSIS — Z20822 Contact with and (suspected) exposure to covid-19: Secondary | ICD-10-CM | POA: Diagnosis not present

## 2020-10-28 DIAGNOSIS — E890 Postprocedural hypothyroidism: Secondary | ICD-10-CM | POA: Diagnosis not present

## 2020-10-28 DIAGNOSIS — Z95810 Presence of automatic (implantable) cardiac defibrillator: Secondary | ICD-10-CM | POA: Diagnosis not present

## 2020-10-28 DIAGNOSIS — I442 Atrioventricular block, complete: Secondary | ICD-10-CM | POA: Diagnosis not present

## 2020-10-28 DIAGNOSIS — R269 Unspecified abnormalities of gait and mobility: Secondary | ICD-10-CM | POA: Diagnosis not present

## 2020-10-28 DIAGNOSIS — K5900 Constipation, unspecified: Secondary | ICD-10-CM | POA: Diagnosis not present

## 2020-10-28 DIAGNOSIS — I77810 Thoracic aortic ectasia: Secondary | ICD-10-CM | POA: Diagnosis not present

## 2020-10-28 DIAGNOSIS — I350 Nonrheumatic aortic (valve) stenosis: Secondary | ICD-10-CM | POA: Diagnosis not present

## 2020-10-28 DIAGNOSIS — J9621 Acute and chronic respiratory failure with hypoxia: Secondary | ICD-10-CM | POA: Diagnosis not present

## 2020-10-28 DIAGNOSIS — E785 Hyperlipidemia, unspecified: Secondary | ICD-10-CM | POA: Diagnosis not present

## 2020-10-28 DIAGNOSIS — Z66 Do not resuscitate: Secondary | ICD-10-CM | POA: Diagnosis not present

## 2020-10-28 DIAGNOSIS — I255 Ischemic cardiomyopathy: Secondary | ICD-10-CM | POA: Diagnosis not present

## 2020-10-28 DIAGNOSIS — R1310 Dysphagia, unspecified: Secondary | ICD-10-CM | POA: Diagnosis not present

## 2020-10-28 DIAGNOSIS — I5022 Chronic systolic (congestive) heart failure: Secondary | ICD-10-CM | POA: Diagnosis not present

## 2020-10-28 DIAGNOSIS — I359 Nonrheumatic aortic valve disorder, unspecified: Secondary | ICD-10-CM | POA: Diagnosis not present

## 2020-10-28 DIAGNOSIS — N390 Urinary tract infection, site not specified: Secondary | ICD-10-CM | POA: Diagnosis not present

## 2020-10-28 DIAGNOSIS — I2699 Other pulmonary embolism without acute cor pulmonale: Secondary | ICD-10-CM | POA: Diagnosis not present

## 2020-10-29 DIAGNOSIS — I2510 Atherosclerotic heart disease of native coronary artery without angina pectoris: Secondary | ICD-10-CM | POA: Diagnosis not present

## 2020-10-29 DIAGNOSIS — I255 Ischemic cardiomyopathy: Secondary | ICD-10-CM | POA: Diagnosis not present

## 2020-10-29 DIAGNOSIS — I5022 Chronic systolic (congestive) heart failure: Secondary | ICD-10-CM | POA: Diagnosis not present

## 2020-10-29 DIAGNOSIS — I2699 Other pulmonary embolism without acute cor pulmonale: Secondary | ICD-10-CM | POA: Diagnosis not present

## 2020-10-29 DIAGNOSIS — J9621 Acute and chronic respiratory failure with hypoxia: Secondary | ICD-10-CM | POA: Diagnosis not present

## 2020-10-29 DIAGNOSIS — E785 Hyperlipidemia, unspecified: Secondary | ICD-10-CM | POA: Diagnosis not present

## 2020-10-29 DIAGNOSIS — Z95810 Presence of automatic (implantable) cardiac defibrillator: Secondary | ICD-10-CM | POA: Diagnosis not present

## 2020-10-29 DIAGNOSIS — I350 Nonrheumatic aortic (valve) stenosis: Secondary | ICD-10-CM | POA: Diagnosis not present

## 2020-10-29 DIAGNOSIS — Z515 Encounter for palliative care: Secondary | ICD-10-CM | POA: Diagnosis not present

## 2020-10-29 DIAGNOSIS — I442 Atrioventricular block, complete: Secondary | ICD-10-CM | POA: Diagnosis not present

## 2020-10-29 DIAGNOSIS — N390 Urinary tract infection, site not specified: Secondary | ICD-10-CM | POA: Diagnosis not present

## 2020-10-29 DIAGNOSIS — R269 Unspecified abnormalities of gait and mobility: Secondary | ICD-10-CM | POA: Diagnosis not present

## 2020-10-29 DIAGNOSIS — Z66 Do not resuscitate: Secondary | ICD-10-CM | POA: Diagnosis not present

## 2020-10-29 DIAGNOSIS — K5900 Constipation, unspecified: Secondary | ICD-10-CM | POA: Diagnosis not present

## 2020-10-29 DIAGNOSIS — E890 Postprocedural hypothyroidism: Secondary | ICD-10-CM | POA: Diagnosis not present

## 2020-10-29 DIAGNOSIS — I472 Ventricular tachycardia: Secondary | ICD-10-CM | POA: Diagnosis not present

## 2020-10-29 DIAGNOSIS — J432 Centrilobular emphysema: Secondary | ICD-10-CM | POA: Diagnosis not present

## 2020-10-29 DIAGNOSIS — K649 Unspecified hemorrhoids: Secondary | ICD-10-CM | POA: Diagnosis not present

## 2020-10-29 DIAGNOSIS — Z20822 Contact with and (suspected) exposure to covid-19: Secondary | ICD-10-CM | POA: Diagnosis not present

## 2020-10-29 DIAGNOSIS — R1310 Dysphagia, unspecified: Secondary | ICD-10-CM | POA: Diagnosis not present

## 2020-10-30 DIAGNOSIS — K5900 Constipation, unspecified: Secondary | ICD-10-CM | POA: Diagnosis not present

## 2020-10-30 DIAGNOSIS — R269 Unspecified abnormalities of gait and mobility: Secondary | ICD-10-CM | POA: Diagnosis not present

## 2020-10-30 DIAGNOSIS — Z20822 Contact with and (suspected) exposure to covid-19: Secondary | ICD-10-CM | POA: Diagnosis not present

## 2020-10-30 DIAGNOSIS — Z95810 Presence of automatic (implantable) cardiac defibrillator: Secondary | ICD-10-CM | POA: Diagnosis not present

## 2020-10-30 DIAGNOSIS — I350 Nonrheumatic aortic (valve) stenosis: Secondary | ICD-10-CM | POA: Diagnosis not present

## 2020-10-30 DIAGNOSIS — R1310 Dysphagia, unspecified: Secondary | ICD-10-CM | POA: Diagnosis not present

## 2020-10-30 DIAGNOSIS — N390 Urinary tract infection, site not specified: Secondary | ICD-10-CM | POA: Diagnosis not present

## 2020-10-30 DIAGNOSIS — I5022 Chronic systolic (congestive) heart failure: Secondary | ICD-10-CM | POA: Diagnosis not present

## 2020-10-30 DIAGNOSIS — K649 Unspecified hemorrhoids: Secondary | ICD-10-CM | POA: Diagnosis not present

## 2020-10-30 DIAGNOSIS — I472 Ventricular tachycardia: Secondary | ICD-10-CM | POA: Diagnosis not present

## 2020-10-30 DIAGNOSIS — E785 Hyperlipidemia, unspecified: Secondary | ICD-10-CM | POA: Diagnosis not present

## 2020-10-30 DIAGNOSIS — E890 Postprocedural hypothyroidism: Secondary | ICD-10-CM | POA: Diagnosis not present

## 2020-10-30 DIAGNOSIS — J9621 Acute and chronic respiratory failure with hypoxia: Secondary | ICD-10-CM | POA: Diagnosis not present

## 2020-10-30 DIAGNOSIS — Z515 Encounter for palliative care: Secondary | ICD-10-CM | POA: Diagnosis not present

## 2020-10-30 DIAGNOSIS — I2510 Atherosclerotic heart disease of native coronary artery without angina pectoris: Secondary | ICD-10-CM | POA: Diagnosis not present

## 2020-10-30 DIAGNOSIS — I255 Ischemic cardiomyopathy: Secondary | ICD-10-CM | POA: Diagnosis not present

## 2020-10-30 DIAGNOSIS — I442 Atrioventricular block, complete: Secondary | ICD-10-CM | POA: Diagnosis not present

## 2020-10-30 DIAGNOSIS — J432 Centrilobular emphysema: Secondary | ICD-10-CM | POA: Diagnosis not present

## 2020-10-30 DIAGNOSIS — I2699 Other pulmonary embolism without acute cor pulmonale: Secondary | ICD-10-CM | POA: Diagnosis not present

## 2020-10-30 DIAGNOSIS — Z66 Do not resuscitate: Secondary | ICD-10-CM | POA: Diagnosis not present

## 2020-10-31 DIAGNOSIS — R1310 Dysphagia, unspecified: Secondary | ICD-10-CM | POA: Diagnosis not present

## 2020-10-31 DIAGNOSIS — I5022 Chronic systolic (congestive) heart failure: Secondary | ICD-10-CM | POA: Diagnosis not present

## 2020-10-31 DIAGNOSIS — I255 Ischemic cardiomyopathy: Secondary | ICD-10-CM | POA: Diagnosis not present

## 2020-10-31 DIAGNOSIS — I2510 Atherosclerotic heart disease of native coronary artery without angina pectoris: Secondary | ICD-10-CM | POA: Diagnosis not present

## 2020-10-31 DIAGNOSIS — I472 Ventricular tachycardia: Secondary | ICD-10-CM | POA: Diagnosis not present

## 2020-10-31 DIAGNOSIS — Z515 Encounter for palliative care: Secondary | ICD-10-CM | POA: Diagnosis not present

## 2020-10-31 DIAGNOSIS — N390 Urinary tract infection, site not specified: Secondary | ICD-10-CM | POA: Diagnosis not present

## 2020-10-31 DIAGNOSIS — Z66 Do not resuscitate: Secondary | ICD-10-CM | POA: Diagnosis not present

## 2020-10-31 DIAGNOSIS — E785 Hyperlipidemia, unspecified: Secondary | ICD-10-CM | POA: Diagnosis not present

## 2020-10-31 DIAGNOSIS — Z20822 Contact with and (suspected) exposure to covid-19: Secondary | ICD-10-CM | POA: Diagnosis not present

## 2020-10-31 DIAGNOSIS — I2699 Other pulmonary embolism without acute cor pulmonale: Secondary | ICD-10-CM | POA: Diagnosis not present

## 2020-10-31 DIAGNOSIS — R269 Unspecified abnormalities of gait and mobility: Secondary | ICD-10-CM | POA: Diagnosis not present

## 2020-10-31 DIAGNOSIS — I442 Atrioventricular block, complete: Secondary | ICD-10-CM | POA: Diagnosis not present

## 2020-10-31 DIAGNOSIS — K649 Unspecified hemorrhoids: Secondary | ICD-10-CM | POA: Diagnosis not present

## 2020-10-31 DIAGNOSIS — J9621 Acute and chronic respiratory failure with hypoxia: Secondary | ICD-10-CM | POA: Diagnosis not present

## 2020-10-31 DIAGNOSIS — Z95810 Presence of automatic (implantable) cardiac defibrillator: Secondary | ICD-10-CM | POA: Diagnosis not present

## 2020-10-31 DIAGNOSIS — I350 Nonrheumatic aortic (valve) stenosis: Secondary | ICD-10-CM | POA: Diagnosis not present

## 2020-10-31 DIAGNOSIS — R0602 Shortness of breath: Secondary | ICD-10-CM | POA: Diagnosis not present

## 2020-10-31 DIAGNOSIS — J432 Centrilobular emphysema: Secondary | ICD-10-CM | POA: Diagnosis not present

## 2020-10-31 DIAGNOSIS — R918 Other nonspecific abnormal finding of lung field: Secondary | ICD-10-CM | POA: Diagnosis not present

## 2020-10-31 DIAGNOSIS — E890 Postprocedural hypothyroidism: Secondary | ICD-10-CM | POA: Diagnosis not present

## 2020-10-31 DIAGNOSIS — K5900 Constipation, unspecified: Secondary | ICD-10-CM | POA: Diagnosis not present

## 2020-11-01 DIAGNOSIS — N390 Urinary tract infection, site not specified: Secondary | ICD-10-CM | POA: Diagnosis not present

## 2020-11-01 DIAGNOSIS — I255 Ischemic cardiomyopathy: Secondary | ICD-10-CM | POA: Diagnosis not present

## 2020-11-01 DIAGNOSIS — I472 Ventricular tachycardia: Secondary | ICD-10-CM | POA: Diagnosis not present

## 2020-11-01 DIAGNOSIS — I442 Atrioventricular block, complete: Secondary | ICD-10-CM | POA: Diagnosis not present

## 2020-11-01 DIAGNOSIS — I5022 Chronic systolic (congestive) heart failure: Secondary | ICD-10-CM | POA: Diagnosis not present

## 2020-11-01 DIAGNOSIS — Z20822 Contact with and (suspected) exposure to covid-19: Secondary | ICD-10-CM | POA: Diagnosis not present

## 2020-11-01 DIAGNOSIS — R1310 Dysphagia, unspecified: Secondary | ICD-10-CM | POA: Diagnosis not present

## 2020-11-01 DIAGNOSIS — E890 Postprocedural hypothyroidism: Secondary | ICD-10-CM | POA: Diagnosis not present

## 2020-11-01 DIAGNOSIS — J9621 Acute and chronic respiratory failure with hypoxia: Secondary | ICD-10-CM | POA: Diagnosis not present

## 2020-11-01 DIAGNOSIS — K5900 Constipation, unspecified: Secondary | ICD-10-CM | POA: Diagnosis not present

## 2020-11-01 DIAGNOSIS — Z66 Do not resuscitate: Secondary | ICD-10-CM | POA: Diagnosis not present

## 2020-11-01 DIAGNOSIS — E785 Hyperlipidemia, unspecified: Secondary | ICD-10-CM | POA: Diagnosis not present

## 2020-11-01 DIAGNOSIS — R269 Unspecified abnormalities of gait and mobility: Secondary | ICD-10-CM | POA: Diagnosis not present

## 2020-11-01 DIAGNOSIS — I2510 Atherosclerotic heart disease of native coronary artery without angina pectoris: Secondary | ICD-10-CM | POA: Diagnosis not present

## 2020-11-01 DIAGNOSIS — J432 Centrilobular emphysema: Secondary | ICD-10-CM | POA: Diagnosis not present

## 2020-11-01 DIAGNOSIS — I350 Nonrheumatic aortic (valve) stenosis: Secondary | ICD-10-CM | POA: Diagnosis not present

## 2020-11-01 DIAGNOSIS — Z515 Encounter for palliative care: Secondary | ICD-10-CM | POA: Diagnosis not present

## 2020-11-01 DIAGNOSIS — Z95810 Presence of automatic (implantable) cardiac defibrillator: Secondary | ICD-10-CM | POA: Diagnosis not present

## 2020-11-01 DIAGNOSIS — I2699 Other pulmonary embolism without acute cor pulmonale: Secondary | ICD-10-CM | POA: Diagnosis not present

## 2020-11-01 DIAGNOSIS — K649 Unspecified hemorrhoids: Secondary | ICD-10-CM | POA: Diagnosis not present

## 2020-11-02 DIAGNOSIS — I255 Ischemic cardiomyopathy: Secondary | ICD-10-CM | POA: Diagnosis not present

## 2020-11-02 DIAGNOSIS — R269 Unspecified abnormalities of gait and mobility: Secondary | ICD-10-CM | POA: Diagnosis not present

## 2020-11-02 DIAGNOSIS — E785 Hyperlipidemia, unspecified: Secondary | ICD-10-CM | POA: Diagnosis not present

## 2020-11-02 DIAGNOSIS — I2510 Atherosclerotic heart disease of native coronary artery without angina pectoris: Secondary | ICD-10-CM | POA: Diagnosis not present

## 2020-11-02 DIAGNOSIS — K649 Unspecified hemorrhoids: Secondary | ICD-10-CM | POA: Diagnosis not present

## 2020-11-02 DIAGNOSIS — J9621 Acute and chronic respiratory failure with hypoxia: Secondary | ICD-10-CM | POA: Diagnosis not present

## 2020-11-02 DIAGNOSIS — Z66 Do not resuscitate: Secondary | ICD-10-CM | POA: Diagnosis not present

## 2020-11-02 DIAGNOSIS — I472 Ventricular tachycardia: Secondary | ICD-10-CM | POA: Diagnosis not present

## 2020-11-02 DIAGNOSIS — J432 Centrilobular emphysema: Secondary | ICD-10-CM | POA: Diagnosis not present

## 2020-11-02 DIAGNOSIS — I2699 Other pulmonary embolism without acute cor pulmonale: Secondary | ICD-10-CM | POA: Diagnosis not present

## 2020-11-02 DIAGNOSIS — R1310 Dysphagia, unspecified: Secondary | ICD-10-CM | POA: Diagnosis not present

## 2020-11-02 DIAGNOSIS — N390 Urinary tract infection, site not specified: Secondary | ICD-10-CM | POA: Diagnosis not present

## 2020-11-02 DIAGNOSIS — R410 Disorientation, unspecified: Secondary | ICD-10-CM | POA: Diagnosis not present

## 2020-11-02 DIAGNOSIS — Z20822 Contact with and (suspected) exposure to covid-19: Secondary | ICD-10-CM | POA: Diagnosis not present

## 2020-11-02 DIAGNOSIS — Z7189 Other specified counseling: Secondary | ICD-10-CM | POA: Diagnosis not present

## 2020-11-02 DIAGNOSIS — Z515 Encounter for palliative care: Secondary | ICD-10-CM | POA: Diagnosis not present

## 2020-11-02 DIAGNOSIS — I442 Atrioventricular block, complete: Secondary | ICD-10-CM | POA: Diagnosis not present

## 2020-11-02 DIAGNOSIS — K5900 Constipation, unspecified: Secondary | ICD-10-CM | POA: Diagnosis not present

## 2020-11-02 DIAGNOSIS — I350 Nonrheumatic aortic (valve) stenosis: Secondary | ICD-10-CM | POA: Diagnosis not present

## 2020-11-02 DIAGNOSIS — E890 Postprocedural hypothyroidism: Secondary | ICD-10-CM | POA: Diagnosis not present

## 2020-11-02 DIAGNOSIS — Z95810 Presence of automatic (implantable) cardiac defibrillator: Secondary | ICD-10-CM | POA: Diagnosis not present

## 2020-11-02 DIAGNOSIS — I5022 Chronic systolic (congestive) heart failure: Secondary | ICD-10-CM | POA: Diagnosis not present

## 2020-11-03 ENCOUNTER — Ambulatory Visit (INDEPENDENT_AMBULATORY_CARE_PROVIDER_SITE_OTHER): Payer: Medicare Other

## 2020-11-03 DIAGNOSIS — I472 Ventricular tachycardia, unspecified: Secondary | ICD-10-CM

## 2020-11-03 DIAGNOSIS — Z043 Encounter for examination and observation following other accident: Secondary | ICD-10-CM | POA: Diagnosis not present

## 2020-11-03 DIAGNOSIS — S79912A Unspecified injury of left hip, initial encounter: Secondary | ICD-10-CM | POA: Diagnosis not present

## 2020-11-03 DIAGNOSIS — I2699 Other pulmonary embolism without acute cor pulmonale: Secondary | ICD-10-CM | POA: Diagnosis not present

## 2020-11-03 DIAGNOSIS — R1310 Dysphagia, unspecified: Secondary | ICD-10-CM | POA: Diagnosis not present

## 2020-11-03 DIAGNOSIS — K649 Unspecified hemorrhoids: Secondary | ICD-10-CM | POA: Diagnosis not present

## 2020-11-03 DIAGNOSIS — Z66 Do not resuscitate: Secondary | ICD-10-CM | POA: Diagnosis not present

## 2020-11-03 DIAGNOSIS — N390 Urinary tract infection, site not specified: Secondary | ICD-10-CM | POA: Diagnosis not present

## 2020-11-03 DIAGNOSIS — I2510 Atherosclerotic heart disease of native coronary artery without angina pectoris: Secondary | ICD-10-CM | POA: Diagnosis not present

## 2020-11-03 DIAGNOSIS — J9621 Acute and chronic respiratory failure with hypoxia: Secondary | ICD-10-CM | POA: Diagnosis not present

## 2020-11-03 DIAGNOSIS — I350 Nonrheumatic aortic (valve) stenosis: Secondary | ICD-10-CM | POA: Diagnosis not present

## 2020-11-03 DIAGNOSIS — I442 Atrioventricular block, complete: Secondary | ICD-10-CM | POA: Diagnosis not present

## 2020-11-03 DIAGNOSIS — E785 Hyperlipidemia, unspecified: Secondary | ICD-10-CM | POA: Diagnosis not present

## 2020-11-03 DIAGNOSIS — K5900 Constipation, unspecified: Secondary | ICD-10-CM | POA: Diagnosis not present

## 2020-11-03 DIAGNOSIS — Z20822 Contact with and (suspected) exposure to covid-19: Secondary | ICD-10-CM | POA: Diagnosis not present

## 2020-11-03 DIAGNOSIS — R269 Unspecified abnormalities of gait and mobility: Secondary | ICD-10-CM | POA: Diagnosis not present

## 2020-11-03 DIAGNOSIS — E890 Postprocedural hypothyroidism: Secondary | ICD-10-CM | POA: Diagnosis not present

## 2020-11-03 DIAGNOSIS — S79911A Unspecified injury of right hip, initial encounter: Secondary | ICD-10-CM | POA: Diagnosis not present

## 2020-11-03 DIAGNOSIS — Z95810 Presence of automatic (implantable) cardiac defibrillator: Secondary | ICD-10-CM | POA: Diagnosis not present

## 2020-11-03 DIAGNOSIS — I5022 Chronic systolic (congestive) heart failure: Secondary | ICD-10-CM | POA: Diagnosis not present

## 2020-11-03 DIAGNOSIS — I255 Ischemic cardiomyopathy: Secondary | ICD-10-CM | POA: Diagnosis not present

## 2020-11-03 DIAGNOSIS — R296 Repeated falls: Secondary | ICD-10-CM | POA: Diagnosis not present

## 2020-11-03 DIAGNOSIS — Z515 Encounter for palliative care: Secondary | ICD-10-CM | POA: Diagnosis not present

## 2020-11-03 DIAGNOSIS — J432 Centrilobular emphysema: Secondary | ICD-10-CM | POA: Diagnosis not present

## 2020-11-04 DIAGNOSIS — I442 Atrioventricular block, complete: Secondary | ICD-10-CM | POA: Diagnosis not present

## 2020-11-04 DIAGNOSIS — I5022 Chronic systolic (congestive) heart failure: Secondary | ICD-10-CM | POA: Diagnosis not present

## 2020-11-04 DIAGNOSIS — N390 Urinary tract infection, site not specified: Secondary | ICD-10-CM | POA: Diagnosis not present

## 2020-11-04 DIAGNOSIS — I350 Nonrheumatic aortic (valve) stenosis: Secondary | ICD-10-CM | POA: Diagnosis not present

## 2020-11-04 DIAGNOSIS — I2510 Atherosclerotic heart disease of native coronary artery without angina pectoris: Secondary | ICD-10-CM | POA: Diagnosis not present

## 2020-11-04 DIAGNOSIS — E785 Hyperlipidemia, unspecified: Secondary | ICD-10-CM | POA: Diagnosis not present

## 2020-11-04 DIAGNOSIS — J432 Centrilobular emphysema: Secondary | ICD-10-CM | POA: Diagnosis not present

## 2020-11-04 DIAGNOSIS — R269 Unspecified abnormalities of gait and mobility: Secondary | ICD-10-CM | POA: Diagnosis not present

## 2020-11-04 DIAGNOSIS — J9621 Acute and chronic respiratory failure with hypoxia: Secondary | ICD-10-CM | POA: Diagnosis not present

## 2020-11-04 DIAGNOSIS — K922 Gastrointestinal hemorrhage, unspecified: Secondary | ICD-10-CM | POA: Diagnosis not present

## 2020-11-04 DIAGNOSIS — Z515 Encounter for palliative care: Secondary | ICD-10-CM | POA: Diagnosis not present

## 2020-11-04 DIAGNOSIS — I472 Ventricular tachycardia: Secondary | ICD-10-CM | POA: Diagnosis not present

## 2020-11-04 DIAGNOSIS — I2699 Other pulmonary embolism without acute cor pulmonale: Secondary | ICD-10-CM | POA: Diagnosis not present

## 2020-11-04 DIAGNOSIS — Z66 Do not resuscitate: Secondary | ICD-10-CM | POA: Diagnosis not present

## 2020-11-04 DIAGNOSIS — E890 Postprocedural hypothyroidism: Secondary | ICD-10-CM | POA: Diagnosis not present

## 2020-11-04 DIAGNOSIS — K5900 Constipation, unspecified: Secondary | ICD-10-CM | POA: Diagnosis not present

## 2020-11-04 DIAGNOSIS — Z20822 Contact with and (suspected) exposure to covid-19: Secondary | ICD-10-CM | POA: Diagnosis not present

## 2020-11-04 DIAGNOSIS — I255 Ischemic cardiomyopathy: Secondary | ICD-10-CM | POA: Diagnosis not present

## 2020-11-04 DIAGNOSIS — R1310 Dysphagia, unspecified: Secondary | ICD-10-CM | POA: Diagnosis not present

## 2020-11-04 DIAGNOSIS — Z95810 Presence of automatic (implantable) cardiac defibrillator: Secondary | ICD-10-CM | POA: Diagnosis not present

## 2020-11-04 DIAGNOSIS — K649 Unspecified hemorrhoids: Secondary | ICD-10-CM | POA: Diagnosis not present

## 2020-11-05 DIAGNOSIS — I5022 Chronic systolic (congestive) heart failure: Secondary | ICD-10-CM | POA: Diagnosis not present

## 2020-11-05 DIAGNOSIS — J432 Centrilobular emphysema: Secondary | ICD-10-CM | POA: Diagnosis not present

## 2020-11-05 DIAGNOSIS — I2510 Atherosclerotic heart disease of native coronary artery without angina pectoris: Secondary | ICD-10-CM | POA: Diagnosis not present

## 2020-11-05 DIAGNOSIS — K649 Unspecified hemorrhoids: Secondary | ICD-10-CM | POA: Diagnosis not present

## 2020-11-05 DIAGNOSIS — K5900 Constipation, unspecified: Secondary | ICD-10-CM | POA: Diagnosis not present

## 2020-11-05 DIAGNOSIS — Z7189 Other specified counseling: Secondary | ICD-10-CM | POA: Diagnosis not present

## 2020-11-05 DIAGNOSIS — K921 Melena: Secondary | ICD-10-CM | POA: Diagnosis not present

## 2020-11-05 DIAGNOSIS — Z95810 Presence of automatic (implantable) cardiac defibrillator: Secondary | ICD-10-CM | POA: Diagnosis not present

## 2020-11-05 DIAGNOSIS — Z515 Encounter for palliative care: Secondary | ICD-10-CM | POA: Diagnosis not present

## 2020-11-05 DIAGNOSIS — E785 Hyperlipidemia, unspecified: Secondary | ICD-10-CM | POA: Diagnosis not present

## 2020-11-05 DIAGNOSIS — I350 Nonrheumatic aortic (valve) stenosis: Secondary | ICD-10-CM | POA: Diagnosis not present

## 2020-11-05 DIAGNOSIS — E890 Postprocedural hypothyroidism: Secondary | ICD-10-CM | POA: Diagnosis not present

## 2020-11-05 DIAGNOSIS — I472 Ventricular tachycardia: Secondary | ICD-10-CM | POA: Diagnosis not present

## 2020-11-05 DIAGNOSIS — Z20822 Contact with and (suspected) exposure to covid-19: Secondary | ICD-10-CM | POA: Diagnosis not present

## 2020-11-05 DIAGNOSIS — R1310 Dysphagia, unspecified: Secondary | ICD-10-CM | POA: Diagnosis not present

## 2020-11-05 DIAGNOSIS — Z66 Do not resuscitate: Secondary | ICD-10-CM | POA: Diagnosis not present

## 2020-11-05 DIAGNOSIS — I442 Atrioventricular block, complete: Secondary | ICD-10-CM | POA: Diagnosis not present

## 2020-11-05 DIAGNOSIS — I2699 Other pulmonary embolism without acute cor pulmonale: Secondary | ICD-10-CM | POA: Diagnosis not present

## 2020-11-05 DIAGNOSIS — I255 Ischemic cardiomyopathy: Secondary | ICD-10-CM | POA: Diagnosis not present

## 2020-11-05 DIAGNOSIS — J9621 Acute and chronic respiratory failure with hypoxia: Secondary | ICD-10-CM | POA: Diagnosis not present

## 2020-11-05 DIAGNOSIS — R269 Unspecified abnormalities of gait and mobility: Secondary | ICD-10-CM | POA: Diagnosis not present

## 2020-11-05 DIAGNOSIS — N390 Urinary tract infection, site not specified: Secondary | ICD-10-CM | POA: Diagnosis not present

## 2020-11-05 DIAGNOSIS — R410 Disorientation, unspecified: Secondary | ICD-10-CM | POA: Diagnosis not present

## 2020-11-06 ENCOUNTER — Ambulatory Visit: Payer: Medicare Other | Admitting: Pulmonary Disease

## 2020-11-06 DIAGNOSIS — I2699 Other pulmonary embolism without acute cor pulmonale: Secondary | ICD-10-CM | POA: Diagnosis not present

## 2020-11-06 DIAGNOSIS — K649 Unspecified hemorrhoids: Secondary | ICD-10-CM | POA: Diagnosis not present

## 2020-11-06 DIAGNOSIS — I442 Atrioventricular block, complete: Secondary | ICD-10-CM | POA: Diagnosis not present

## 2020-11-06 DIAGNOSIS — R269 Unspecified abnormalities of gait and mobility: Secondary | ICD-10-CM | POA: Diagnosis not present

## 2020-11-06 DIAGNOSIS — R0602 Shortness of breath: Secondary | ICD-10-CM | POA: Diagnosis not present

## 2020-11-06 DIAGNOSIS — J9621 Acute and chronic respiratory failure with hypoxia: Secondary | ICD-10-CM | POA: Diagnosis not present

## 2020-11-06 DIAGNOSIS — R410 Disorientation, unspecified: Secondary | ICD-10-CM | POA: Diagnosis not present

## 2020-11-06 DIAGNOSIS — I517 Cardiomegaly: Secondary | ICD-10-CM | POA: Diagnosis not present

## 2020-11-06 DIAGNOSIS — I5022 Chronic systolic (congestive) heart failure: Secondary | ICD-10-CM | POA: Diagnosis not present

## 2020-11-06 DIAGNOSIS — Z515 Encounter for palliative care: Secondary | ICD-10-CM | POA: Diagnosis not present

## 2020-11-06 DIAGNOSIS — J9611 Chronic respiratory failure with hypoxia: Secondary | ICD-10-CM | POA: Diagnosis not present

## 2020-11-06 DIAGNOSIS — I255 Ischemic cardiomyopathy: Secondary | ICD-10-CM | POA: Diagnosis not present

## 2020-11-06 DIAGNOSIS — I472 Ventricular tachycardia: Secondary | ICD-10-CM | POA: Diagnosis not present

## 2020-11-06 DIAGNOSIS — Z7901 Long term (current) use of anticoagulants: Secondary | ICD-10-CM | POA: Diagnosis not present

## 2020-11-06 DIAGNOSIS — R1310 Dysphagia, unspecified: Secondary | ICD-10-CM | POA: Diagnosis not present

## 2020-11-06 DIAGNOSIS — E785 Hyperlipidemia, unspecified: Secondary | ICD-10-CM | POA: Diagnosis not present

## 2020-11-06 DIAGNOSIS — Z7189 Other specified counseling: Secondary | ICD-10-CM | POA: Diagnosis not present

## 2020-11-06 DIAGNOSIS — I350 Nonrheumatic aortic (valve) stenosis: Secondary | ICD-10-CM | POA: Diagnosis not present

## 2020-11-06 DIAGNOSIS — I2510 Atherosclerotic heart disease of native coronary artery without angina pectoris: Secondary | ICD-10-CM | POA: Diagnosis not present

## 2020-11-06 DIAGNOSIS — J984 Other disorders of lung: Secondary | ICD-10-CM | POA: Diagnosis not present

## 2020-11-06 DIAGNOSIS — N390 Urinary tract infection, site not specified: Secondary | ICD-10-CM | POA: Diagnosis not present

## 2020-11-06 DIAGNOSIS — E890 Postprocedural hypothyroidism: Secondary | ICD-10-CM | POA: Diagnosis not present

## 2020-11-06 DIAGNOSIS — J432 Centrilobular emphysema: Secondary | ICD-10-CM | POA: Diagnosis not present

## 2020-11-06 DIAGNOSIS — K921 Melena: Secondary | ICD-10-CM | POA: Diagnosis not present

## 2020-11-06 DIAGNOSIS — K922 Gastrointestinal hemorrhage, unspecified: Secondary | ICD-10-CM | POA: Diagnosis not present

## 2020-11-06 DIAGNOSIS — Z20822 Contact with and (suspected) exposure to covid-19: Secondary | ICD-10-CM | POA: Diagnosis not present

## 2020-11-06 DIAGNOSIS — Z66 Do not resuscitate: Secondary | ICD-10-CM | POA: Diagnosis not present

## 2020-11-06 DIAGNOSIS — K5900 Constipation, unspecified: Secondary | ICD-10-CM | POA: Diagnosis not present

## 2020-11-06 DIAGNOSIS — Z95810 Presence of automatic (implantable) cardiac defibrillator: Secondary | ICD-10-CM | POA: Diagnosis not present

## 2020-11-07 DIAGNOSIS — Z95810 Presence of automatic (implantable) cardiac defibrillator: Secondary | ICD-10-CM | POA: Diagnosis not present

## 2020-11-07 DIAGNOSIS — J432 Centrilobular emphysema: Secondary | ICD-10-CM | POA: Diagnosis not present

## 2020-11-07 DIAGNOSIS — J9621 Acute and chronic respiratory failure with hypoxia: Secondary | ICD-10-CM | POA: Diagnosis not present

## 2020-11-07 DIAGNOSIS — I5022 Chronic systolic (congestive) heart failure: Secondary | ICD-10-CM | POA: Diagnosis not present

## 2020-11-07 DIAGNOSIS — I2699 Other pulmonary embolism without acute cor pulmonale: Secondary | ICD-10-CM | POA: Diagnosis not present

## 2020-11-08 DIAGNOSIS — I2699 Other pulmonary embolism without acute cor pulmonale: Secondary | ICD-10-CM | POA: Diagnosis not present

## 2020-11-08 DIAGNOSIS — Z95810 Presence of automatic (implantable) cardiac defibrillator: Secondary | ICD-10-CM | POA: Diagnosis not present

## 2020-11-08 DIAGNOSIS — I5022 Chronic systolic (congestive) heart failure: Secondary | ICD-10-CM | POA: Diagnosis not present

## 2020-11-08 DIAGNOSIS — J9621 Acute and chronic respiratory failure with hypoxia: Secondary | ICD-10-CM | POA: Diagnosis not present

## 2020-11-08 DIAGNOSIS — J432 Centrilobular emphysema: Secondary | ICD-10-CM | POA: Diagnosis not present

## 2020-11-09 DIAGNOSIS — I2699 Other pulmonary embolism without acute cor pulmonale: Secondary | ICD-10-CM | POA: Diagnosis not present

## 2020-11-09 DIAGNOSIS — Z7189 Other specified counseling: Secondary | ICD-10-CM | POA: Diagnosis not present

## 2020-11-09 DIAGNOSIS — Z95810 Presence of automatic (implantable) cardiac defibrillator: Secondary | ICD-10-CM | POA: Diagnosis not present

## 2020-11-09 DIAGNOSIS — J432 Centrilobular emphysema: Secondary | ICD-10-CM | POA: Diagnosis not present

## 2020-11-09 DIAGNOSIS — I5022 Chronic systolic (congestive) heart failure: Secondary | ICD-10-CM | POA: Diagnosis not present

## 2020-11-09 DIAGNOSIS — R410 Disorientation, unspecified: Secondary | ICD-10-CM | POA: Diagnosis not present

## 2020-11-09 DIAGNOSIS — Z515 Encounter for palliative care: Secondary | ICD-10-CM | POA: Diagnosis not present

## 2020-11-09 DIAGNOSIS — J9621 Acute and chronic respiratory failure with hypoxia: Secondary | ICD-10-CM | POA: Diagnosis not present

## 2020-11-10 DIAGNOSIS — Z95810 Presence of automatic (implantable) cardiac defibrillator: Secondary | ICD-10-CM | POA: Diagnosis not present

## 2020-11-10 DIAGNOSIS — I2699 Other pulmonary embolism without acute cor pulmonale: Secondary | ICD-10-CM | POA: Diagnosis not present

## 2020-11-10 DIAGNOSIS — J9621 Acute and chronic respiratory failure with hypoxia: Secondary | ICD-10-CM | POA: Diagnosis not present

## 2020-11-10 DIAGNOSIS — I442 Atrioventricular block, complete: Secondary | ICD-10-CM | POA: Diagnosis not present

## 2020-11-10 DIAGNOSIS — Z515 Encounter for palliative care: Secondary | ICD-10-CM | POA: Diagnosis not present

## 2020-11-10 DIAGNOSIS — R410 Disorientation, unspecified: Secondary | ICD-10-CM | POA: Diagnosis not present

## 2020-11-10 DIAGNOSIS — Z7189 Other specified counseling: Secondary | ICD-10-CM | POA: Diagnosis not present

## 2020-11-10 DIAGNOSIS — J432 Centrilobular emphysema: Secondary | ICD-10-CM | POA: Diagnosis not present

## 2020-11-10 DIAGNOSIS — I5022 Chronic systolic (congestive) heart failure: Secondary | ICD-10-CM | POA: Diagnosis not present

## 2020-11-11 DIAGNOSIS — Z95810 Presence of automatic (implantable) cardiac defibrillator: Secondary | ICD-10-CM | POA: Diagnosis not present

## 2020-11-11 DIAGNOSIS — I5022 Chronic systolic (congestive) heart failure: Secondary | ICD-10-CM | POA: Diagnosis not present

## 2020-11-11 DIAGNOSIS — J432 Centrilobular emphysema: Secondary | ICD-10-CM | POA: Diagnosis not present

## 2020-11-11 DIAGNOSIS — I442 Atrioventricular block, complete: Secondary | ICD-10-CM | POA: Diagnosis not present

## 2020-11-11 DIAGNOSIS — I2699 Other pulmonary embolism without acute cor pulmonale: Secondary | ICD-10-CM | POA: Diagnosis not present

## 2020-11-12 DIAGNOSIS — Z95810 Presence of automatic (implantable) cardiac defibrillator: Secondary | ICD-10-CM | POA: Diagnosis not present

## 2020-11-12 DIAGNOSIS — I5022 Chronic systolic (congestive) heart failure: Secondary | ICD-10-CM | POA: Diagnosis not present

## 2020-11-12 DIAGNOSIS — J432 Centrilobular emphysema: Secondary | ICD-10-CM | POA: Diagnosis not present

## 2020-11-12 DIAGNOSIS — I442 Atrioventricular block, complete: Secondary | ICD-10-CM | POA: Diagnosis not present

## 2020-11-12 DIAGNOSIS — I2699 Other pulmonary embolism without acute cor pulmonale: Secondary | ICD-10-CM | POA: Diagnosis not present

## 2020-11-13 DIAGNOSIS — I442 Atrioventricular block, complete: Secondary | ICD-10-CM | POA: Diagnosis not present

## 2020-11-13 DIAGNOSIS — I5022 Chronic systolic (congestive) heart failure: Secondary | ICD-10-CM | POA: Diagnosis not present

## 2020-11-13 DIAGNOSIS — J432 Centrilobular emphysema: Secondary | ICD-10-CM | POA: Diagnosis not present

## 2020-11-13 DIAGNOSIS — Z95810 Presence of automatic (implantable) cardiac defibrillator: Secondary | ICD-10-CM | POA: Diagnosis not present

## 2020-11-13 DIAGNOSIS — I2699 Other pulmonary embolism without acute cor pulmonale: Secondary | ICD-10-CM | POA: Diagnosis not present

## 2020-11-14 ENCOUNTER — Other Ambulatory Visit: Payer: Self-pay | Admitting: Internal Medicine

## 2020-11-17 NOTE — Progress Notes (Signed)
Remote pacemaker transmission.   

## 2020-11-21 DIAGNOSIS — J449 Chronic obstructive pulmonary disease, unspecified: Secondary | ICD-10-CM | POA: Diagnosis not present

## 2020-12-15 DEATH — deceased

## 2020-12-17 ENCOUNTER — Telehealth (HOSPITAL_COMMUNITY): Payer: Self-pay | Admitting: *Deleted

## 2020-12-17 NOTE — Telephone Encounter (Signed)
Pt's daughter called to report that he had passed away in 12-25-22, she request appt reminders stop. Pt marked deceased, message sent to device clinic as an [place]

## 2020-12-21 ENCOUNTER — Ambulatory Visit (HOSPITAL_COMMUNITY): Payer: Medicare Other

## 2020-12-21 ENCOUNTER — Encounter (HOSPITAL_COMMUNITY): Payer: Medicare Other | Admitting: Internal Medicine

## 2021-02-15 ENCOUNTER — Ambulatory Visit: Payer: Medicare Other | Admitting: Adult Health
